# Patient Record
Sex: Female | Born: 1950 | Race: Black or African American | Hispanic: No | Marital: Single | Smoking: Former smoker
Health system: Southern US, Community
[De-identification: ages and names within clinical notes are randomized; demographics above are authoritative.]

## PROBLEM LIST (undated history)

## (undated) DIAGNOSIS — I10 Essential (primary) hypertension: Secondary | ICD-10-CM

## (undated) DIAGNOSIS — C801 Malignant (primary) neoplasm, unspecified: Secondary | ICD-10-CM

## (undated) DIAGNOSIS — I739 Peripheral vascular disease, unspecified: Secondary | ICD-10-CM

## (undated) DIAGNOSIS — M545 Low back pain, unspecified: Secondary | ICD-10-CM

## (undated) DIAGNOSIS — E7800 Pure hypercholesterolemia, unspecified: Secondary | ICD-10-CM

## (undated) DIAGNOSIS — I639 Cerebral infarction, unspecified: Secondary | ICD-10-CM

## (undated) DIAGNOSIS — K8020 Calculus of gallbladder without cholecystitis without obstruction: Secondary | ICD-10-CM

## (undated) DIAGNOSIS — R0600 Dyspnea, unspecified: Secondary | ICD-10-CM

## (undated) DIAGNOSIS — F319 Bipolar disorder, unspecified: Secondary | ICD-10-CM

## (undated) DIAGNOSIS — G8929 Other chronic pain: Secondary | ICD-10-CM

## (undated) DIAGNOSIS — F329 Major depressive disorder, single episode, unspecified: Secondary | ICD-10-CM

## (undated) DIAGNOSIS — R221 Localized swelling, mass and lump, neck: Secondary | ICD-10-CM

## (undated) DIAGNOSIS — F419 Anxiety disorder, unspecified: Secondary | ICD-10-CM

## (undated) DIAGNOSIS — K219 Gastro-esophageal reflux disease without esophagitis: Secondary | ICD-10-CM

## (undated) DIAGNOSIS — J42 Unspecified chronic bronchitis: Secondary | ICD-10-CM

## (undated) DIAGNOSIS — F209 Schizophrenia, unspecified: Secondary | ICD-10-CM

## (undated) DIAGNOSIS — Z923 Personal history of irradiation: Secondary | ICD-10-CM

## (undated) DIAGNOSIS — M1990 Unspecified osteoarthritis, unspecified site: Secondary | ICD-10-CM

## (undated) DIAGNOSIS — F32A Depression, unspecified: Secondary | ICD-10-CM

## (undated) HISTORY — PX: BREAST BIOPSY: SHX20

## (undated) HISTORY — PX: ANKLE FRACTURE SURGERY: SHX122

## (undated) HISTORY — DX: Unspecified osteoarthritis, unspecified site: M19.90

## (undated) HISTORY — PX: ABDOMINAL HYSTERECTOMY: SHX81

## (undated) HISTORY — DX: Cerebral infarction, unspecified: I63.9

## (undated) HISTORY — DX: Malignant (primary) neoplasm, unspecified: C80.1

## (undated) HISTORY — PX: CATARACT EXTRACTION W/ INTRAOCULAR LENS  IMPLANT, BILATERAL: SHX1307

## (undated) HISTORY — PX: PILONIDAL CYST EXCISION: SHX744

## (undated) HISTORY — PX: EXCISIONAL HEMORRHOIDECTOMY: SHX1541

## (undated) HISTORY — PX: ANKLE HARDWARE REMOVAL: SHX1149

## (undated) HISTORY — PX: BREAST CYST EXCISION: SHX579

## (undated) HISTORY — PX: APPENDECTOMY: SHX54

---

## 2002-11-15 ENCOUNTER — Encounter: Payer: Self-pay | Admitting: Internal Medicine

## 2002-11-15 ENCOUNTER — Encounter: Admission: RE | Admit: 2002-11-15 | Discharge: 2002-11-15 | Payer: Self-pay | Admitting: Internal Medicine

## 2003-10-24 ENCOUNTER — Emergency Department (HOSPITAL_COMMUNITY): Admission: EM | Admit: 2003-10-24 | Discharge: 2003-10-24 | Payer: Self-pay | Admitting: Emergency Medicine

## 2004-04-22 ENCOUNTER — Encounter: Admission: RE | Admit: 2004-04-22 | Discharge: 2004-04-22 | Payer: Self-pay | Admitting: Internal Medicine

## 2004-05-22 ENCOUNTER — Ambulatory Visit (HOSPITAL_COMMUNITY): Admission: RE | Admit: 2004-05-22 | Discharge: 2004-05-22 | Payer: Self-pay | Admitting: *Deleted

## 2005-11-25 ENCOUNTER — Encounter: Admission: RE | Admit: 2005-11-25 | Discharge: 2005-11-25 | Payer: Self-pay | Admitting: Internal Medicine

## 2006-11-26 ENCOUNTER — Encounter: Admission: RE | Admit: 2006-11-26 | Discharge: 2006-11-26 | Payer: Self-pay | Admitting: Internal Medicine

## 2007-11-29 ENCOUNTER — Encounter: Admission: RE | Admit: 2007-11-29 | Discharge: 2007-11-29 | Payer: Self-pay | Admitting: Internal Medicine

## 2008-02-09 ENCOUNTER — Emergency Department (HOSPITAL_COMMUNITY): Admission: EM | Admit: 2008-02-09 | Discharge: 2008-02-09 | Payer: Self-pay | Admitting: Emergency Medicine

## 2008-03-10 ENCOUNTER — Encounter: Admission: RE | Admit: 2008-03-10 | Discharge: 2008-03-10 | Payer: Self-pay | Admitting: Internal Medicine

## 2008-12-06 ENCOUNTER — Encounter: Admission: RE | Admit: 2008-12-06 | Discharge: 2008-12-06 | Payer: Self-pay | Admitting: Internal Medicine

## 2010-01-03 ENCOUNTER — Encounter: Admission: RE | Admit: 2010-01-03 | Discharge: 2010-01-03 | Payer: Self-pay | Admitting: Internal Medicine

## 2010-11-11 ENCOUNTER — Emergency Department (HOSPITAL_COMMUNITY)
Admission: EM | Admit: 2010-11-11 | Discharge: 2010-11-12 | Payer: Self-pay | Source: Home / Self Care | Admitting: Emergency Medicine

## 2010-12-22 ENCOUNTER — Encounter: Payer: Self-pay | Admitting: Internal Medicine

## 2010-12-23 ENCOUNTER — Other Ambulatory Visit: Payer: Self-pay | Admitting: Internal Medicine

## 2010-12-23 DIAGNOSIS — Z1239 Encounter for other screening for malignant neoplasm of breast: Secondary | ICD-10-CM

## 2011-01-08 ENCOUNTER — Ambulatory Visit: Payer: Self-pay

## 2011-01-08 ENCOUNTER — Ambulatory Visit
Admission: RE | Admit: 2011-01-08 | Discharge: 2011-01-08 | Disposition: A | Discharge status: Home / Self Care | Payer: Medicaid Other | Source: Ambulatory Visit | Attending: Internal Medicine | Admitting: Internal Medicine

## 2011-01-08 DIAGNOSIS — Z1239 Encounter for other screening for malignant neoplasm of breast: Secondary | ICD-10-CM

## 2011-08-25 LAB — HEPATIC FUNCTION PANEL
AST: 18
Albumin: 3.4 — ABNORMAL LOW
Alkaline Phosphatase: 62
Total Protein: 6.6

## 2011-08-25 LAB — URINALYSIS, ROUTINE W REFLEX MICROSCOPIC
Hgb urine dipstick: NEGATIVE
Specific Gravity, Urine: 1.015
Urobilinogen, UA: 0.2
pH: 5.5

## 2011-08-25 LAB — CBC
MCV: 88.1
RBC: 5.01
RDW: 15.9 — ABNORMAL HIGH
WBC: 9.2

## 2011-08-25 LAB — I-STAT 8, (EC8 V) (CONVERTED LAB)
BUN: 10
Bicarbonate: 20.8
Chloride: 111
Potassium: 4
Sodium: 140
pH, Ven: 7.359 — ABNORMAL HIGH

## 2011-08-25 LAB — DIFFERENTIAL
Basophils Absolute: 0.1
Basophils Relative: 1
Eosinophils Relative: 1
Monocytes Absolute: 0.7

## 2011-08-25 LAB — POCT CARDIAC MARKERS: Troponin i, poc: <0.05

## 2012-01-26 ENCOUNTER — Other Ambulatory Visit: Payer: Self-pay | Admitting: Internal Medicine

## 2012-01-28 ENCOUNTER — Other Ambulatory Visit: Payer: Self-pay | Admitting: Internal Medicine

## 2012-01-28 DIAGNOSIS — Z1231 Encounter for screening mammogram for malignant neoplasm of breast: Secondary | ICD-10-CM

## 2012-02-17 ENCOUNTER — Ambulatory Visit
Admission: RE | Admit: 2012-02-17 | Discharge: 2012-02-17 | Disposition: A | Discharge status: Home / Self Care | Payer: Medicaid Other | Source: Ambulatory Visit | Attending: Internal Medicine | Admitting: Internal Medicine

## 2012-02-17 ENCOUNTER — Other Ambulatory Visit: Payer: Self-pay | Admitting: Internal Medicine

## 2012-02-17 DIAGNOSIS — Z1231 Encounter for screening mammogram for malignant neoplasm of breast: Secondary | ICD-10-CM

## 2012-02-17 DIAGNOSIS — N644 Mastodynia: Secondary | ICD-10-CM

## 2012-02-24 ENCOUNTER — Ambulatory Visit
Admission: RE | Admit: 2012-02-24 | Discharge: 2012-02-24 | Disposition: A | Discharge status: Home / Self Care | Payer: Medicaid Other | Source: Ambulatory Visit | Attending: Internal Medicine | Admitting: Internal Medicine

## 2012-02-24 DIAGNOSIS — N644 Mastodynia: Secondary | ICD-10-CM

## 2013-01-21 ENCOUNTER — Other Ambulatory Visit: Payer: Self-pay | Admitting: Internal Medicine

## 2013-01-21 DIAGNOSIS — Z1231 Encounter for screening mammogram for malignant neoplasm of breast: Secondary | ICD-10-CM

## 2013-02-22 ENCOUNTER — Ambulatory Visit: Payer: Medicaid Other

## 2013-02-25 ENCOUNTER — Ambulatory Visit: Payer: Self-pay | Admitting: Podiatry

## 2013-03-16 ENCOUNTER — Ambulatory Visit: Payer: Medicaid Other

## 2013-04-08 ENCOUNTER — Ambulatory Visit: Payer: Medicaid Other

## 2013-05-01 HISTORY — PX: MULTIPLE TOOTH EXTRACTIONS: SHX2053

## 2013-06-07 ENCOUNTER — Ambulatory Visit: Payer: Medicaid Other

## 2013-06-23 ENCOUNTER — Ambulatory Visit
Admission: RE | Admit: 2013-06-23 | Discharge: 2013-06-23 | Disposition: A | Discharge status: Home / Self Care | Payer: Medicaid Other | Source: Ambulatory Visit | Attending: Internal Medicine | Admitting: Internal Medicine

## 2013-06-23 DIAGNOSIS — Z1231 Encounter for screening mammogram for malignant neoplasm of breast: Secondary | ICD-10-CM

## 2014-02-08 ENCOUNTER — Other Ambulatory Visit: Payer: Self-pay | Admitting: Internal Medicine

## 2014-02-08 DIAGNOSIS — R1011 Right upper quadrant pain: Secondary | ICD-10-CM

## 2014-02-16 ENCOUNTER — Other Ambulatory Visit: Payer: Medicaid Other

## 2014-02-21 ENCOUNTER — Ambulatory Visit
Admission: RE | Admit: 2014-02-21 | Discharge: 2014-02-21 | Disposition: A | Discharge status: Home / Self Care | Payer: Medicaid Other | Source: Ambulatory Visit | Attending: Internal Medicine | Admitting: Internal Medicine

## 2014-02-21 DIAGNOSIS — R1011 Right upper quadrant pain: Secondary | ICD-10-CM

## 2014-02-26 ENCOUNTER — Emergency Department (HOSPITAL_COMMUNITY)
Admission: EM | Admit: 2014-02-26 | Discharge: 2014-02-26 | Disposition: A | Discharge status: Home / Self Care | Payer: Medicaid Other | Attending: Emergency Medicine | Admitting: Emergency Medicine

## 2014-02-26 ENCOUNTER — Encounter (HOSPITAL_COMMUNITY): Payer: Self-pay | Admitting: Emergency Medicine

## 2014-02-26 ENCOUNTER — Emergency Department (HOSPITAL_COMMUNITY): Payer: Medicaid Other

## 2014-02-26 DIAGNOSIS — R4182 Altered mental status, unspecified: Secondary | ICD-10-CM

## 2014-02-26 DIAGNOSIS — M549 Dorsalgia, unspecified: Secondary | ICD-10-CM | POA: Insufficient documentation

## 2014-02-26 DIAGNOSIS — F1010 Alcohol abuse, uncomplicated: Secondary | ICD-10-CM | POA: Insufficient documentation

## 2014-02-26 DIAGNOSIS — F1210 Cannabis abuse, uncomplicated: Secondary | ICD-10-CM | POA: Insufficient documentation

## 2014-02-26 DIAGNOSIS — I10 Essential (primary) hypertension: Secondary | ICD-10-CM | POA: Insufficient documentation

## 2014-02-26 DIAGNOSIS — Z8669 Personal history of other diseases of the nervous system and sense organs: Secondary | ICD-10-CM | POA: Insufficient documentation

## 2014-02-26 DIAGNOSIS — F17200 Nicotine dependence, unspecified, uncomplicated: Secondary | ICD-10-CM | POA: Insufficient documentation

## 2014-02-26 HISTORY — DX: Essential (primary) hypertension: I10

## 2014-02-26 LAB — URINALYSIS, ROUTINE W REFLEX MICROSCOPIC
BILIRUBIN URINE: NEGATIVE
GLUCOSE, UA: NEGATIVE mg/dL
HGB URINE DIPSTICK: NEGATIVE
KETONES UR: 15 mg/dL — AB
Leukocytes, UA: NEGATIVE
NITRITE: NEGATIVE
PROTEIN: NEGATIVE mg/dL
SPECIFIC GRAVITY, URINE: 1.026 (ref 1.005–1.030)
Urobilinogen, UA: 0.2 mg/dL (ref 0.0–1.0)
pH: 5.5 (ref 5.0–8.0)

## 2014-02-26 LAB — CBC WITH DIFFERENTIAL/PLATELET
BASOS PCT: 0 % (ref 0–1)
Basophils Absolute: 0 10*3/uL (ref 0.0–0.1)
Eosinophils Absolute: 0.1 10*3/uL (ref 0.0–0.7)
Eosinophils Relative: 1 % (ref 0–5)
HCT: 44.1 % (ref 36.0–46.0)
HEMOGLOBIN: 16 g/dL — AB (ref 12.0–15.0)
LYMPHS ABS: 3.7 10*3/uL (ref 0.7–4.0)
Lymphocytes Relative: 54 % — ABNORMAL HIGH (ref 12–46)
MCH: 30.8 pg (ref 26.0–34.0)
MCHC: 36.3 g/dL — ABNORMAL HIGH (ref 30.0–36.0)
MCV: 84.8 fL (ref 78.0–100.0)
MONOS PCT: 8 % (ref 3–12)
Monocytes Absolute: 0.6 10*3/uL (ref 0.1–1.0)
NEUTROS ABS: 2.6 10*3/uL (ref 1.7–7.7)
NEUTROS PCT: 37 % — AB (ref 43–77)
Platelets: 190 10*3/uL (ref 150–400)
RBC: 5.2 MIL/uL — AB (ref 3.87–5.11)
RDW: 14.1 % (ref 11.5–15.5)
WBC: 6.9 10*3/uL (ref 4.0–10.5)

## 2014-02-26 LAB — COMPREHENSIVE METABOLIC PANEL
ALT: 12 U/L (ref 0–35)
AST: 15 U/L (ref 0–37)
Albumin: 3.5 g/dL (ref 3.5–5.2)
Alkaline Phosphatase: 79 U/L (ref 39–117)
BUN: 12 mg/dL (ref 6–23)
CALCIUM: 9.1 mg/dL (ref 8.4–10.5)
CO2: 23 meq/L (ref 19–32)
CREATININE: 0.54 mg/dL (ref 0.50–1.10)
Chloride: 102 mEq/L (ref 96–112)
GLUCOSE: 79 mg/dL (ref 70–99)
Potassium: 2.9 mEq/L — CL (ref 3.7–5.3)
Sodium: 140 mEq/L (ref 137–147)
Total Bilirubin: 0.5 mg/dL (ref 0.3–1.2)
Total Protein: 6.9 g/dL (ref 6.0–8.3)

## 2014-02-26 LAB — RAPID URINE DRUG SCREEN, HOSP PERFORMED
AMPHETAMINES: NOT DETECTED
BARBITURATES: NOT DETECTED
BENZODIAZEPINES: NOT DETECTED
Cocaine: NOT DETECTED
Opiates: NOT DETECTED
Tetrahydrocannabinol: POSITIVE — AB

## 2014-02-26 LAB — ACETAMINOPHEN LEVEL: Acetaminophen (Tylenol), Serum: <15 ug/mL (ref 10–30)

## 2014-02-26 LAB — AMMONIA: AMMONIA: 29 umol/L (ref 11–60)

## 2014-02-26 LAB — ETHANOL

## 2014-02-26 MED ORDER — POTASSIUM CHLORIDE CRYS ER 20 MEQ PO TBCR
40.0000 meq | EXTENDED_RELEASE_TABLET | Freq: Once | ORAL | Status: AC
  Administered 2014-02-26: 40 meq via ORAL
  Filled 2014-02-26: qty 2

## 2014-02-26 MED ORDER — SODIUM CHLORIDE 0.9 % IV SOLN
INTRAVENOUS | Status: DC
  Administered 2014-02-26: 13:00:00 via INTRAVENOUS

## 2014-02-26 MED ORDER — THIAMINE HCL 100 MG/ML IJ SOLN
100.0000 mg | Freq: Once | INTRAMUSCULAR | Status: AC
  Administered 2014-02-26: 100 mg via INTRAVENOUS
  Filled 2014-02-26: qty 2

## 2014-02-26 MED ORDER — POTASSIUM CHLORIDE CRYS ER 20 MEQ PO TBCR: 20.0000 meq | EXTENDED_RELEASE_TABLET | Freq: Every day | ORAL | Status: DC

## 2014-02-26 NOTE — ED Notes (Addendum)
Pt presents to department from home via [facility] for evaluation of altered mental status. Family states she was confused today. LSN: Friday, equal grip strengths, pt is lethargic but able to answer most simple questions. 20g L forearm. CBG 162. Pt has slurred and garbled speech, no facial droop noted. Strong bilateral grip strengths.

## 2014-02-26 NOTE — ED Notes (Signed)
EDP at bedside to perform exam, daughter also at bedside. Pt remains lethargic and confused.

## 2014-02-26 NOTE — Discharge Instructions (Signed)
Altered Mental Status Take your medications only as prescribed. Mixing Alcohol and your pain medications is potentially dangerous and can cause death. Call your pain management Center and advised the physician that your pain is not well controlled. Avoid marijuana and alcohol. Call Dr. Santiago Bur office tomorrow and ask him to recheck your blood potassium this week. Also ask him to help you to stop smoking. It is safe to take Robitussin as directed for cough Altered mental status most often refers to an abnormal change in your responsiveness and awareness. It can affect your speech, thought, mobility, memory, attention span, or alertness. It can range from slight confusion to complete unresponsiveness (coma). Altered mental status can be a sign of a serious underlying medical condition. Rapid evaluation and medical treatment is necessary for patients having an altered mental status. CAUSES   Low blood sugar (hypoglycemia) or diabetes.  Severe loss of body fluids (dehydration) or a body salt (electrolyte) imbalance.  A stroke or other neurologic problem, such as dementia or delirium.  A head injury or tumor.  A drug or alcohol overdose.  Exposure to toxins or poisons.  Depression, anxiety, and stress.  A low oxygen level (hypoxia).  An infection.  Blood loss.  Twitching or shaking (seizure).  Heart problems, such as heart attack or heart rhythm problems (arrhythmias).  A body temperature that is too low or too high (hypothermia or hyperthermia). DIAGNOSIS  A diagnosis is based on your history, symptoms, physical and neurologic examinations, and diagnostic tests. Diagnostic tests may include:  Measurement of your blood pressure, pulse, breathing, and oxygen levels (vital signs).  Blood tests.  Urine tests.  X-ray exams.  A computerized magnetic scan (magnetic resonance imaging, MRI).  A computerized X-ray scan (computed tomography, CT scan). TREATMENT  Treatment will depend  on the cause. Treatment may include:  Management of an underlying medical or mental health condition.  Critical care or support in the hospital. [place]   Only take over-the-counter or prescription medicines for pain, discomfort, or fever as directed by your caregiver.  Manage underlying conditions as directed by your caregiver.  Eat a healthy, well-balanced diet to maintain strength.  Join a support group or prevention program to cope with the condition or trauma that caused the altered mental status. Ask your caregiver to help choose a program that works for you.  Follow up with your caregiver for further examination, therapy, or testing as directed. SEEK MEDICAL CARE IF:   You feel unwell or have chills.  You or your family notice a change in your behavior or your alertness.  You have trouble following your caregiver's treatment plan.  You have questions or concerns. SEEK IMMEDIATE MEDICAL CARE IF:   You have a rapid heartbeat or have chest pain.  You have difficulty breathing.  You have a fever.  You have a headache with a stiff neck.  You cough up blood.  You have blood in your urine or stool.  You have severe agitation or confusion. MAKE SURE YOU:   Understand these instructions.  Will watch your condition.  Will get help right away if you are not doing well or get worse. Document Released: 05/07/2010 Document Revised: 02/09/2012 Document Reviewed: 05/07/2010 [facility] Patient Information 2014 [place].

## 2014-02-26 NOTE — ED Notes (Signed)
Daughter states that patient drinks several beers each day (10-12) cans. Daughter thinks she could of had seizure.

## 2014-02-26 NOTE — ED Notes (Signed)
Pt is alert at the time and answering questions appropriately. States she feels very tired. Vital signs stable. Family at bedside.

## 2014-02-26 NOTE — ED Provider Notes (Signed)
CSN: [id number]     Arrival date & time 02/26/14  1143 History   None    Chief Complaint  Patient presents with  . Altered Mental Status     (Consider location/radiation/quality/duration/timing/severity/associated sxs/prior Treatment) HPI Low 5 caveat altered mental status Patient was found by her caretaker and by her daughter in bed this morning after she did not answer the door. Her daughter climbed into the window of the patient's house and found her unresponsive in the bed foaming at the mouth. She thinks her mother may have had a seizure. 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 Patient presently complains of right-sided back pain which she's experienced for several years. She's treated with Percocet in a pain clinic. Patient admits to drinking alcohol 2 nights ago at a party. No treatment prior to coming here.  Past Medical History  Diagnosis Date  . Hypertension   . Back pain    alcohol abuse. Currently heavy drinker History reviewed. No pertinent past surgical history. No family history on file. History  Substance Use Topics  . Smoking status: Current Every Day Smoker    Types: Cigarettes  . Smokeless tobacco: Not on file  . Alcohol Use: Yes   OB History   Grav Para Term Preterm Abortions TAB SAB Ect Mult Living                 no illicit drug Review of Systems  Unable to perform ROS: Mental status change      Allergies  Review of patient's allergies indicates no known allergies.  Home Medications  No current outpatient prescriptions on file. BP 122/79  Temp(Src) 98.1 F (36.7 C) (Oral)  SpO2 94% Physical Exam  Nursing note and vitals reviewed. Constitutional:  Chronically ill-appearing  HENT:  Head: Normocephalic and atraumatic.  Eyes: Conjunctivae are normal. Pupils are equal, round, and reactive to light.  Neck: Neck supple. No tracheal deviation present. No thyromegaly present.  Cardiovascular: Normal rate and regular rhythm.   No murmur  heard. Pulmonary/Chest: Effort normal and breath sounds normal.  Abdominal: Soft. Bowel sounds are normal. She exhibits no distension. There is no tenderness.  Musculoskeletal: Normal range of motion. She exhibits no edema and no tenderness.  Entire spine nontender  Neurological: She is alert. She has normal reflexes. Coordination normal.  Oriented to name and month does not know year. Speech slurred. DTRs symmetric bilaterally knee jerk ankle jerk and biceps was ordered bilaterally. No asterixis. Follow simple commands moves all extremities  Skin: Skin is warm and dry. No rash noted.  Psychiatric: She has a normal mood and affect.    ED Course  Procedures (including critical care time) Labs Review Labs Reviewed - No data to display Imaging Review No results found.   EKG Interpretation   Date/Time:  Sunday February 26 2014 16:04:44 EDT Ventricular Rate:  82 PR Interval:    QRS Duration: 76 QT Interval:  353 QTC Calculation: 412 R Axis:   82 Text Interpretation:  Atrial fibrillation Borderline right axis deviation  Nonspecific T abnormalities, lateral leads No significant change since  last tracing Confirmed by Deontrae Drinkard  MD, Tad Fancher [phone]) on 02/26/2014 4:07:30  PM      Results for orders placed during the hospital encounter of 02/26/14  COMPREHENSIVE METABOLIC PANEL      Result Value Ref Range   Sodium 140  137 - 147 mEq/L   Potassium 2.9 (*) 3.7 - 5.3 mEq/L   Chloride 102  96 - 112  mEq/L   CO2 23  19 - 32 mEq/L   Glucose, Bld 79  70 - 99 mg/dL   BUN 12  6 - 23 mg/dL   Creatinine, Ser 0.54  0.50 - 1.10 mg/dL   Calcium 9.1  8.4 - 10.5 mg/dL   Total Protein 6.9  6.0 - 8.3 g/dL   Albumin 3.5  3.5 - 5.2 g/dL   AST 15  0 - 37 U/L   ALT 12  0 - 35 U/L   Alkaline Phosphatase 79  39 - 117 U/L   Total Bilirubin 0.5  0.3 - 1.2 mg/dL   GFR calc non Af Amer >90  >90 mL/min   GFR calc Af Amer >90  >90 mL/min  CBC WITH DIFFERENTIAL      Result Value Ref Range   WBC 6.9  4.0 - 10.5  K/uL   RBC 5.20 (*) 3.87 - 5.11 MIL/uL   Hemoglobin 16.0 (*) 12.0 - 15.0 g/dL   HCT 44.1  36.0 - 46.0 %   MCV 84.8  78.0 - 100.0 fL   MCH 30.8  26.0 - 34.0 pg   MCHC 36.3 (*) 30.0 - 36.0 g/dL   RDW 14.1  11.5 - 15.5 %   Platelets 190  150 - 400 K/uL   Neutrophils Relative % 37 (*) 43 - 77 %   Neutro Abs 2.6  1.7 - 7.7 K/uL   Lymphocytes Relative 54 (*) 12 - 46 %   Lymphs Abs 3.7  0.7 - 4.0 K/uL   Monocytes Relative 8  3 - 12 %   Monocytes Absolute 0.6  0.1 - 1.0 K/uL   Eosinophils Relative 1  0 - 5 %   Eosinophils Absolute 0.1  0.0 - 0.7 K/uL   Basophils Relative 0  0 - 1 %   Basophils Absolute 0.0  0.0 - 0.1 K/uL  URINALYSIS, ROUTINE W REFLEX MICROSCOPIC      Result Value Ref Range   Color, Urine YELLOW  YELLOW   APPearance CLEAR  CLEAR   Specific Gravity, Urine 1.026  1.005 - 1.030   pH 5.5  5.0 - 8.0   Glucose, UA NEGATIVE  NEGATIVE mg/dL   Hgb urine dipstick NEGATIVE  NEGATIVE   Bilirubin Urine NEGATIVE  NEGATIVE   Ketones, ur 15 (*) NEGATIVE mg/dL   Protein, ur NEGATIVE  NEGATIVE mg/dL   Urobilinogen, UA 0.2  0.0 - 1.0 mg/dL   Nitrite NEGATIVE  NEGATIVE   Leukocytes, UA NEGATIVE  NEGATIVE  URINE RAPID DRUG SCREEN (HOSP PERFORMED)      Result Value Ref Range   Opiates NONE DETECTED  NONE DETECTED   Cocaine NONE DETECTED  NONE DETECTED   Benzodiazepines NONE DETECTED  NONE DETECTED   Amphetamines NONE DETECTED  NONE DETECTED   Tetrahydrocannabinol POSITIVE (*) NONE DETECTED   Barbiturates NONE DETECTED  NONE DETECTED  ETHANOL      Result Value Ref Range   Alcohol, Ethyl (B) <11  0 - 11 mg/dL  AMMONIA      Result Value Ref Range   Ammonia 29  11 - 60 umol/L  ACETAMINOPHEN LEVEL      Result Value Ref Range   Acetaminophen (Tylenol), Serum <15.0  10 - 30 ug/mL   Dg Chest 2 View  02/26/2014   CLINICAL DATA:  Altered mental status.  EXAM: CHEST - 2 VIEW  COMPARISON:  DG CHEST 2 VIEW dated 11/12/2010; DG CHEST 2 VIEW dated 02/09/2008  FINDINGS: The heart size  and  mediastinal contours are within normal limits. Mild scarring present in both lower lung zones. There is no evidence of pulmonary edema, consolidation, pneumothorax, nodule or pleural fluid. The visualized skeletal structures are unremarkable.  IMPRESSION: No active disease.   Electronically Signed   By: Aletta Edouard M.D.   On: 02/26/2014 13:26   Ct Head Wo Contrast  02/26/2014   CLINICAL DATA:  Altered mental status.  EXAM: CT HEAD WITHOUT CONTRAST  TECHNIQUE: Contiguous axial images were obtained from the base of the skull through the vertex without contrast.  COMPARISON:  11/12/2010  FINDINGS: Normal appearance of the intracranial structures. No evidence for acute hemorrhage, mass lesion, midline shift, hydrocephalus or large infarct. No acute bony abnormality. The visualized sinuses are clear.  IMPRESSION: No acute intracranial abnormality.   Electronically Signed   By: Markus Daft M.D.   On: 02/26/2014 13:10   US Abdomen Complete  02/21/2014   CLINICAL DATA:  Abdominal pain, right upper quadrant  EXAM: ULTRASOUND ABDOMEN COMPLETE  COMPARISON:  None.  FINDINGS: Gallbladder:  The gallbladder is visualized and there is a single mobile gallstone of 1.4 cm in diameter with acoustical shadowing. No pain is present over the gallbladder with compression.  Common bile duct:  Diameter: The common bile duct is within upper limits of normal and 7.2 mm in diameter. Correlation with liver function tests is recommended.  Liver:  The liver has a normal echogenic appearance. There is a complex cyst anteriorly in the left lobe of 1.3 x 1.2 x 1.3 cm. A small cyst in the right lobe measures 1.2 x 0.7 x 0.9 cm.  IVC:  No abnormality visualized.  Pancreas:  Visualized portion unremarkable.  Spleen:  The spleen is normal measuring 7.4 cm sagittally.  Right Kidney:  Length: 11.3 cm.  No hydronephrosis is seen.  Left Kidney:  Length: 10.3 cm.  No hydronephrosis is noted.  Abdominal aorta:  Portions of the abdominal aorta are  obscured by bowel gas but no definite aneurysm is seen.  Other findings:  None.  IMPRESSION: 1. 1.4 cm mobile gallstone. No pain is present over the gallbladder. 2. Probable small complex hepatic cysts. No definite solid hepatic lesion is seen.   Electronically Signed   By: Ivar Drape M.D.   On: 02/21/2014 10:50    4 PM patient is alert and ambulates without difficulty Glasgow Coma Score 15. Speech is clear. She is oriented x3. She looks at baseline per her daughter. MDM  Patient may have suffered a seizure. She's had seizures several years ago. Also feels that she may be over medicating herself with pain medications. Patient admits to smoking marijuana to control pain as her medication regimen from pain clinic is not adequate. Altered mental status is resolved. Etiology is from a seizure and/or  Overmedication Plan prescription for K-Dur. She is to followup with Dr.Avbuere this week to have potassium rechecked. She can take Robitussin as directed for cough. I spent 5 minutes counseling patient on smoking cessation. She is advised. Do not mix medications with alcohol. Stop marijuana. Take medications only as prescribed Final diagnoses:  None   Diagnosis #1 altered mental status-resolved #2 hypokalemia #3 chronic cough #4 chronic pain #5 polysubstance abuse #6 tobacco abuse   Orlie Dakin, MD 02/26/14 [phone]

## 2014-02-26 NOTE — ED Notes (Signed)
Pt up to bathroom with assistance 

## 2014-02-26 NOTE — ED Notes (Signed)
Lab called with critical potassium of 2.9, EDP notified.

## 2014-04-14 ENCOUNTER — Ambulatory Visit: Payer: Self-pay | Admitting: Podiatry

## 2014-04-27 ENCOUNTER — Ambulatory Visit (INDEPENDENT_AMBULATORY_CARE_PROVIDER_SITE_OTHER): Payer: Medicaid Other | Admitting: General Surgery

## 2014-05-01 ENCOUNTER — Encounter (INDEPENDENT_AMBULATORY_CARE_PROVIDER_SITE_OTHER): Payer: Self-pay | Admitting: General Surgery

## 2014-05-03 ENCOUNTER — Ambulatory Visit: Payer: Self-pay | Admitting: Podiatry

## 2014-05-11 ENCOUNTER — Other Ambulatory Visit (INDEPENDENT_AMBULATORY_CARE_PROVIDER_SITE_OTHER): Payer: Self-pay | Admitting: General Surgery

## 2014-05-11 ENCOUNTER — Encounter (INDEPENDENT_AMBULATORY_CARE_PROVIDER_SITE_OTHER): Payer: Self-pay | Admitting: General Surgery

## 2014-05-11 ENCOUNTER — Ambulatory Visit (INDEPENDENT_AMBULATORY_CARE_PROVIDER_SITE_OTHER): Payer: Medicaid Other | Admitting: General Surgery

## 2014-05-11 VITALS — BP 121/63 | HR 75 | Temp 98.6°F | Resp 16 | Ht 61.0 in | Wt 123.8 lb

## 2014-05-11 DIAGNOSIS — K8020 Calculus of gallbladder without cholecystitis without obstruction: Secondary | ICD-10-CM

## 2014-05-11 DIAGNOSIS — R109 Unspecified abdominal pain: Secondary | ICD-10-CM | POA: Insufficient documentation

## 2014-05-11 DIAGNOSIS — R1011 Right upper quadrant pain: Secondary | ICD-10-CM

## 2014-05-11 DIAGNOSIS — R1031 Right lower quadrant pain: Secondary | ICD-10-CM

## 2014-05-11 NOTE — Progress Notes (Signed)
Patient ID: Faith [name], female   DOB: 05-21-1951, 63 y.o.   MRN: [id number]  Chief Complaint  Patient presents with  . Abdominal Pain    gallbladder    HPI Faith [name] is a 63 y.o. female.   HPI 63 yo AAF referred by Dr Jeanie Cooks for evaluation of abdominal pain. The patient reports a several month history of right sided pain. She states her pain is on her right side and radiates to her back. It is more in the right lower abdomen and upper abdomen. It is very intense at times. It initially was in frequent but now it is daily and constant. It is often associated with nausea and vomiting. It is worse when she has a bowel movement. She denies any chest pain. She denies any significant NSAID use. She denies any jaundice. She hasn't really found anything that makes it better. She does take chronic pain medicine and sometimes that might help a little bit. She states her appetite has been off. Her stools are very typically regular. She denies any melena or hematochezia. She underwent a colonoscopy a few months ago which showed diverticuli in the sigmoid colon as well as some hemorrhoids.she does smoke up to 2 packs of cigarettes a day. Past Medical History  Diagnosis Date  . Hypertension   . Back pain   . Arthritis     Past Surgical History  Procedure Laterality Date  . Abdominal hysterectomy    . Breast surgery    . Back surgery    . Ankle surgery      History reviewed. No pertinent family history.  Social History History  Substance Use Topics  . Smoking status: Current Every Day Smoker -- 1.50 packs/day    Types: Cigarettes  . Smokeless tobacco: Not on file  . Alcohol Use: Yes     Comment: rare    No Known Allergies  Current Outpatient Prescriptions  Medication Sig Dispense Refill  . amLODipine-benazepril (LOTREL) 5-10 MG per capsule Take 1 capsule by mouth daily.      Marland Kitchen buPROPion (WELLBUTRIN XL) 150 MG 24 hr tablet Take 150 mg by mouth daily.      Marland Kitchen estrogens, conjugated,  (PREMARIN) 0.625 MG tablet Take 0.625 mg by mouth daily. Take daily for 21 days then do not take for 7 days.      . fluticasone (FLONASE) 50 MCG/ACT nasal spray Place 2 sprays into both nostrils daily.      . furosemide (LASIX) 20 MG tablet Take 20 mg by mouth daily.      Marland Kitchen ibuprofen (ADVIL,MOTRIN) 800 MG tablet Take 800 mg by mouth every 8 (eight) hours as needed (pain).      Marland Kitchen loratadine (CLARITIN) 10 MG tablet Take 10 mg by mouth daily.      . methocarbamol (ROBAXIN) 750 MG tablet Take 750 mg by mouth 2 (two) times daily as needed for muscle spasms.      Marland Kitchen omeprazole (PRILOSEC) 20 MG capsule Take 20 mg by mouth 2 (two) times daily before a meal.      . oxyCODONE-acetaminophen (PERCOCET) 10-325 MG per tablet Take 1 tablet by mouth every 8 (eight) hours as needed for pain.      . potassium chloride SA (K-DUR,KLOR-CON) 20 MEQ tablet Take 1 tablet (20 mEq total) by mouth daily.  10 tablet  0  . risperiDONE (RISPERDAL) 3 MG tablet Take 1.5 mg by mouth at bedtime.      . traMADol (ULTRAM) 50 MG tablet Take  50 mg by mouth 2 (two) times daily as needed (pain).      . traZODone (DESYREL) 50 MG tablet Take 50-100 mg by mouth at bedtime.       No current facility-administered medications for this visit.    Review of Systems Review of Systems  Constitutional: Negative for fever, activity change, appetite change and unexpected weight change.       Took too many pain pills and ended up in ED with AMS  HENT: Negative for nosebleeds and trouble swallowing.   Eyes: Negative for photophobia and visual disturbance.  Respiratory: Negative for chest tightness and shortness of breath.   Cardiovascular: Negative for chest pain and leg swelling.       Denies CP, SOB, orthopnea, PND, DOE  Gastrointestinal: Positive for nausea, vomiting, abdominal pain and constipation. Negative for blood in stool.  Genitourinary: Negative for dysuria and difficulty urinating.  Musculoskeletal: Negative for arthralgias.        OA pains. Has back pain - take narcotics for it  Skin: Negative for pallor and rash.  Neurological: Negative for dizziness, seizures, facial asymmetry and numbness.       Denies TIA and amaurosis fugax   Hematological: Negative for adenopathy. Does not bruise/bleed easily.  Psychiatric/Behavioral: Negative for behavioral problems and agitation.       Reportedly has history of polysubstance abuse but denies drugs/etoh    Blood pressure 121/63, pulse 75, temperature 98.6 F (37 C), temperature source Temporal, resp. rate 16, height 5\' 1"  (1.549 m), weight 123 lb 12.8 oz (56.155 kg).  Physical Exam Physical Exam  Vitals reviewed. Constitutional: She is oriented to person, place, and time. She appears well-developed and well-nourished. She appears cachectic.  Non-toxic appearance. She has a sickly appearance. No distress.  Nontoxic;   HENT:  Head: Normocephalic and atraumatic.  Right Ear: External ear normal.  Left Ear: External ear normal.  Eyes: Conjunctivae are normal. No scleral icterus.  Strabismus   Neck: Normal range of motion. Neck supple. No tracheal deviation present. No thyromegaly present.  Cardiovascular: Normal rate and normal heart sounds.   Pulmonary/Chest: Effort normal and breath sounds normal. No stridor. No respiratory distress. She has no wheezes.  Abdominal: Soft. She exhibits no distension. There is tenderness in the right upper quadrant and right lower quadrant. There is no rebound and no guarding.  Mild TTP in RUQ/RLQ. Seems to be more tender in RLQ. Tries to push my hand away when press in lower abdomen.  Musculoskeletal: She exhibits no edema and no tenderness.  Lymphadenopathy:    She has no cervical adenopathy.  Neurological: She is alert and oriented to person, place, and time. She exhibits normal muscle tone.  Skin: Skin is warm and dry. No rash noted. She is not diaphoretic. No erythema.  Psychiatric: She has a normal mood and affect. Her behavior is  normal. Judgment and thought content normal.    Data Reviewed Dr Avbuere's note ED note regarding altered mental status   Assessment    Cholelithiasis Right sided abdominal pain     Plan    I can't say for sure whether or not her abdominal pain is due to gallbladder disease. She seems to be more tender in her lower abdomen on the right side and in her upper abdomen. Although she does grimace a little but when I press in her right upper abdomen. She is somewhat of a poor historian. The colonoscopy being normal was reassuring. Nonetheless given her comorbidities I am little  bit reluctant to offer her cholecystectomy based on this alone. Therefore I recommended getting a CT scan of her abdomen and pelvis just to make sure nothing is going on in her right lower abdomen. If her CT scan is negative then a more confident and willing to offer her cholecystectomy. If her CT scan is abnormal then obviously that will change the plan.  We went ahead and discussed gallbladder disease including surgery.  I discussed laparoscopic cholecystectomy with IOC in detail.  The patient was given educational material as well as diagrams detailing the procedure.  We discussed the risks and benefits of a laparoscopic cholecystectomy including, but not limited to bleeding, infection, injury to surrounding structures such as the intestine or liver, bile leak, retained gallstones, need to convert to an open procedure, prolonged diarrhea, blood clots such as  DVT, common bile duct injury, anesthesia risks, and possible need for additional procedures.  We discussed the typical post-operative recovery course.   So for now the patient is going to get a CT scan of her abdomen and pelvis to exclude any other abnormalities. If her CT scan is normal with the exception of gallstones and I will contact the patient and offer her cholecystectomy. If her CT scan is abnormal though obviously change her followup plans. The patient and  her daughter agreed with the plan  Leighton Ruff. Redmond Pulling, MD, FACS General, Bariatric, & Minimally Invasive Surgery [facility], [place]          [place] M 05/11/2014, 1:56 PM

## 2014-05-11 NOTE — Patient Instructions (Signed)
We will get a CT scan of your abdomen to look for any abnormalities in your abdomen other than your gallbladder If the CT scan is normal, then I think we can proceed with gallbladder surgery If the CT scan is not normal, we will bring you back to the office to discuss results and plan

## 2014-05-12 ENCOUNTER — Other Ambulatory Visit: Payer: Medicaid Other

## 2014-05-15 ENCOUNTER — Ambulatory Visit (INDEPENDENT_AMBULATORY_CARE_PROVIDER_SITE_OTHER): Payer: Medicaid Other | Admitting: Podiatry

## 2014-05-15 ENCOUNTER — Encounter: Payer: Self-pay | Admitting: Podiatry

## 2014-05-15 VITALS — Ht 61.0 in | Wt 125.0 lb

## 2014-05-15 DIAGNOSIS — G5750 Tarsal tunnel syndrome, unspecified lower limb: Secondary | ICD-10-CM

## 2014-05-15 DIAGNOSIS — G589 Mononeuropathy, unspecified: Secondary | ICD-10-CM

## 2014-05-15 DIAGNOSIS — M79609 Pain in unspecified limb: Secondary | ICD-10-CM

## 2014-05-15 NOTE — Progress Notes (Deleted)
   Subjective:    Patient ID: Faith [name], female    DOB: [birth year], 63 y.o.   MRN: [id number]  HPI Comments: N debridement L 10 toenails D and O long term C thick toenails A difficult to cut T hx of Dr. Janus Molder debridement     Review of Systems  Gastrointestinal:       Currently being evaluated for gallstones.  All other systems reviewed and are negative.      Objective:   Physical Exam        Assessment & Plan:

## 2014-05-15 NOTE — Patient Instructions (Signed)

## 2014-05-15 NOTE — Progress Notes (Signed)
   Subjective:    Patient ID: Faith [name], female    DOB: 10/05/1951, 63 y.o.   MRN: [id number]  HPI Comments: N numbness L right 2, 3,4 toes D 3 months O on and off  C numbness A unknown stimuli T warm water soaks with Johnson Foot Soak  Pt complains of B/L leg cramping at night on and off, gets relief by standing out of the bed.  Pt states this has been going on for 3 months as well.  Pt states she can't afford to have her toenails trimmed.  Patient states that cramping occurs primarily at night and denies cramping or claudication with walking. She describes a persistent numbness in toes 2-4 right  Patient has history of chronic back pain and bipolar disease  Review of Systems  Gastrointestinal:       Currently being evaluated for gallstones.  All other systems reviewed and are negative.      Objective:   Physical Exam  Orientated x3 black female  Vascular: DP pulses 2/4 bilaterally PT ulcer 2/4 bilaterally Capillary reflex immediate bilaterally Feet are warm to touch bilaterally  Neurological: Sensation to 10 g monofilament wire intact 3/5 right and 2/5 left Vibratory sensation intact bilaterally Ankle reflexes equal and reactive bilaterally  Dermatological: No edema is noted Dystrophic toenails with texture and color changes x10 noted  Musculoskeletal: Unstable gait pattern noted There is no palpable calf tenderness or calf edema noted bilaterally No deformities noted      Assessment & Plan:   Assessment: Neuropathic pain undetermined origin Gait disturbance Onychomycoses  Plan: Patient advised that she has signs of neuropathy. I advised patient to contact primary care physician for further evaluation of neuropathy. Vascular status seems to be adequate  Provided patient General Information about neuropathy  Reappoint at patient's request

## 2014-05-16 ENCOUNTER — Encounter: Payer: Self-pay | Admitting: Podiatry

## 2014-05-17 ENCOUNTER — Telehealth (INDEPENDENT_AMBULATORY_CARE_PROVIDER_SITE_OTHER): Payer: Self-pay | Admitting: General Surgery

## 2014-05-17 ENCOUNTER — Other Ambulatory Visit (INDEPENDENT_AMBULATORY_CARE_PROVIDER_SITE_OTHER): Payer: Self-pay | Admitting: General Surgery

## 2014-05-17 ENCOUNTER — Ambulatory Visit
Admission: RE | Admit: 2014-05-17 | Discharge: 2014-05-17 | Disposition: A | Discharge status: Home / Self Care | Payer: Medicaid Other | Source: Ambulatory Visit | Attending: General Surgery | Admitting: General Surgery

## 2014-05-17 DIAGNOSIS — R1011 Right upper quadrant pain: Secondary | ICD-10-CM

## 2014-05-17 DIAGNOSIS — R1031 Right lower quadrant pain: Secondary | ICD-10-CM

## 2014-05-17 DIAGNOSIS — K8020 Calculus of gallbladder without cholecystitis without obstruction: Secondary | ICD-10-CM

## 2014-05-17 DIAGNOSIS — R911 Solitary pulmonary nodule: Secondary | ICD-10-CM

## 2014-05-17 MED ORDER — IOHEXOL 300 MG/ML  SOLN
100.0000 mL | Freq: Once | INTRAMUSCULAR | Status: AC | PRN
  Administered 2014-05-17: 100 mL via INTRAVENOUS

## 2014-05-17 NOTE — Telephone Encounter (Signed)
Called patient to discuss results of CT scan of abdomen and pelvis which was done to evaluate her right-sided abdominal pain. Fortunately there is really no significant abnormalities other than her hepatic cysts. No evidence of osseous abnormalities. However there was a right pulmonary lung nodule seen which was worrisome. Given her significant smoking history, I recommended getting a CT scan of her chest to further evaluate her lungs before we perform a cholecystectomy. This was discussed with the patient. She agreed with the plan. I advised her that if I needed to talk with her daughter who was not available for her to contact the office and I'd be happy to discuss results of the CT scan with the daughter.

## 2014-05-17 NOTE — Telephone Encounter (Signed)
Called patient to let know that has an Ct of chest is schedule for 05-22-14 @ 9:00 am at the [facility]. Patient understood where to go and what time to be there

## 2014-05-19 ENCOUNTER — Telehealth (INDEPENDENT_AMBULATORY_CARE_PROVIDER_SITE_OTHER): Payer: Self-pay

## 2014-05-19 NOTE — Telephone Encounter (Signed)
Msg has been forwarded to Dr Redmond Pulling to call pts niece per [facility].

## 2014-05-19 NOTE — Telephone Encounter (Signed)
Marykay Lex [phone]  of patient would like Dr Redmond Pulling to call her about patients DX

## 2014-05-19 NOTE — Telephone Encounter (Signed)
Called to discuss mgmt of pt to date. Explained ct showed a lung nodule and given her smoking history and her comorbidities I'm reluctant to offer cholecystectomy until we get CT chest to fully evaluate lungs and see if other nodules. Explained that this would require addl workup and more than likely put cholecystectomy on back burner for now if something comes of the nodule(s). Niece voiced understanding.

## 2014-05-22 ENCOUNTER — Ambulatory Visit
Admission: RE | Admit: 2014-05-22 | Discharge: 2014-05-22 | Disposition: A | Discharge status: Home / Self Care | Payer: Medicaid Other | Source: Ambulatory Visit | Attending: General Surgery | Admitting: General Surgery

## 2014-05-22 ENCOUNTER — Telehealth (INDEPENDENT_AMBULATORY_CARE_PROVIDER_SITE_OTHER): Payer: Self-pay | Admitting: General Surgery

## 2014-05-22 DIAGNOSIS — R911 Solitary pulmonary nodule: Secondary | ICD-10-CM

## 2014-05-22 MED ORDER — IOHEXOL 300 MG/ML  SOLN
75.0000 mL | Freq: Once | INTRAMUSCULAR | Status: AC | PRN
  Administered 2014-05-22: 75 mL via INTRAVENOUS

## 2014-05-22 NOTE — Telephone Encounter (Signed)
LMOM for patient niece to call back and ask for [facility]. I need to go over CT of chest and see if they want to proceed with surgery

## 2014-05-22 NOTE — Telephone Encounter (Signed)
I talked to Mrs. Martinique niece and she stated that she will talk to her Aunt to see if she still wants to still have the gallbladder surgery, she will call me back to let me know, and I told her if she does Dr Redmond Pulling will fill out the orders. And I faxed the CT to her PCP Dr. [name]

## 2014-05-23 ENCOUNTER — Telehealth (INDEPENDENT_AMBULATORY_CARE_PROVIDER_SITE_OTHER): Payer: Self-pay | Admitting: General Surgery

## 2014-05-23 NOTE — Telephone Encounter (Signed)
Called patient back and I told her that I went over everything with her niece about her CT Chest and I faxed the results to her PCP to follow CT in 3 month, from her PCP. I asked the patient if she want to have the surgery and she stated yes she is ready to be schedule for surgery. I told her that I will send an message to Dr Redmond Pulling and he will put in orders in epic and our scheduler will call her sometime this week or next week

## 2014-05-23 NOTE — Telephone Encounter (Signed)
Message copied by Maryclare Bean on Tue May 23, 2014  3:27 PM ------      Message from: Joya San      Created: Tue May 23, 2014  3:21 PM      Contact: [phone]       Pt called in concerning results of her test, she stated someone  spoke with her niece but she has questions. ------

## 2014-05-24 ENCOUNTER — Other Ambulatory Visit (INDEPENDENT_AMBULATORY_CARE_PROVIDER_SITE_OTHER): Payer: Self-pay | Admitting: General Surgery

## 2014-06-06 NOTE — Telephone Encounter (Signed)
Noted, Thanks

## 2014-06-06 NOTE — Telephone Encounter (Signed)
Noted,  Thank you!

## 2014-06-12 NOTE — Telephone Encounter (Signed)
Dr Jeanie Cooks,   Her ct chest was done. Results are in [place]. She will need a repeat chest CT in 3 months. We are proceeding with cholecystectomy  Faith [name]. Redmond Pulling, MD, FACS General, Bariatric, & Minimally Invasive Surgery [facility], [place]

## 2014-06-23 ENCOUNTER — Encounter (INDEPENDENT_AMBULATORY_CARE_PROVIDER_SITE_OTHER): Payer: Self-pay

## 2014-06-27 ENCOUNTER — Other Ambulatory Visit (INDEPENDENT_AMBULATORY_CARE_PROVIDER_SITE_OTHER): Payer: Self-pay | Admitting: General Surgery

## 2014-06-27 ENCOUNTER — Encounter (HOSPITAL_COMMUNITY): Payer: Self-pay | Admitting: Pharmacy Technician

## 2014-06-27 ENCOUNTER — Telehealth (INDEPENDENT_AMBULATORY_CARE_PROVIDER_SITE_OTHER): Payer: Self-pay

## 2014-06-27 NOTE — Telephone Encounter (Signed)
Pt scheduled for lap chole on 8/5 and preop is needing consent orders to be placed in epic. They have all of the surgery orders just not the consent.

## 2014-06-27 NOTE — Pre-Procedure Instructions (Signed)
Faith [name]  06/27/2014   Your procedure is scheduled on:  Wed, Aug 5 @ 8:30 AM  Report to [facility]  at 6:30 AM.  Call this number if you have problems the morning of surgery: [phone]   Remember:   Do not eat food or drink liquids after midnight.   Take these medicines the morning of surgery with A SIP OF WATER: Amlodipine-Benazepril(Lotrel),Wellbutrin(Bupropion),Flonase(Fluticasone),Claritin(Loratadine),Omeprazole(Prilosec), and Pain Pill(if needed)               Stop taking your Ibuprofen. No Goody's,BC's,Aleve,Aspirin,Fish Oil,or any Herbal Medications   Do not wear jewelry, make-up or nail polish.  Do not wear lotions, powders, or perfumes. You may wear deodorant.  Do not shave 48 hours prior to surgery.   Do not bring valuables to the hospital.  [facility] is not responsible                  for any belongings or valuables.               Contacts, dentures or bridgework may not be worn into surgery.  Leave suitcase in the car. After surgery it may be brought to your room.  For patients admitted to the hospital, discharge time is determined by your                treatment team.               Patients discharged the day of surgery will not be allowed to drive  home.    Special Instructions:  [place] - Preparing for Surgery  Before surgery, you can play an important role.  Because skin is not sterile, your skin needs to be as free of germs as possible.  You can reduce the number of germs on you skin by washing with CHG (chlorahexidine gluconate) soap before surgery.  CHG is an antiseptic cleaner which kills germs and bonds with the skin to continue killing germs even after washing.  Please DO NOT use if you have an allergy to CHG or antibacterial soaps.  If your skin becomes reddened/irritated stop using the CHG and inform your nurse when you arrive at Short Stay.  Do not shave (including legs and underarms) for at least 48 hours prior to the first CHG  shower.  You may shave your face.  Please follow these instructions carefully:   1.  Shower with CHG Soap the night before surgery and the                                morning of Surgery.  2.  If you choose to wash your hair, wash your hair first as usual with your       normal shampoo.  3.  After you shampoo, rinse your hair and body thoroughly to remove the                      Shampoo.  4.  Use CHG as you would any other liquid soap.  You can apply chg directly       to the skin and wash gently with scrungie or a clean washcloth.  5.  Apply the CHG Soap to your body ONLY FROM THE NECK DOWN.        Do not use on open wounds or open sores.  Avoid contact with your eyes,  ears, mouth and genitals (private parts).  Wash genitals (private parts)       with your normal soap.  6.  Wash thoroughly, paying special attention to the area where your surgery        will be performed.  7.  Thoroughly rinse your body with warm water from the neck down.  8.  DO NOT shower/wash with your normal soap after using and rinsing off       the CHG Soap.  9.  Pat yourself dry with a clean towel.            10.  Wear clean pajamas.            11.  Place clean sheets on your bed the night of your first shower and do not        sleep with pets.  Day of Surgery  Do not apply any lotions/deoderants the morning of surgery.  Please wear clean clothes to the hospital/surgery center.     Please read over the following fact sheets that you were given: Pain Booklet, Coughing and Deep Breathing and Surgical Site Infection Prevention

## 2014-06-28 ENCOUNTER — Inpatient Hospital Stay (HOSPITAL_COMMUNITY)
Admission: RE | Admit: 2014-06-28 | Discharge: 2014-06-28 | Disposition: A | Discharge status: Home / Self Care | Payer: Medicaid Other | Source: Ambulatory Visit

## 2014-06-29 ENCOUNTER — Inpatient Hospital Stay (HOSPITAL_COMMUNITY): Admission: RE | Admit: 2014-06-29 | Payer: Medicaid Other | Source: Ambulatory Visit

## 2014-07-01 DIAGNOSIS — K8020 Calculus of gallbladder without cholecystitis without obstruction: Secondary | ICD-10-CM

## 2014-07-01 HISTORY — DX: Calculus of gallbladder without cholecystitis without obstruction: K80.20

## 2014-07-03 ENCOUNTER — Encounter (HOSPITAL_COMMUNITY)
Admission: RE | Admit: 2014-07-03 | Discharge: 2014-07-03 | Disposition: A | Discharge status: Home / Self Care | Payer: Medicaid Other | Source: Ambulatory Visit | Attending: General Surgery | Admitting: General Surgery

## 2014-07-03 ENCOUNTER — Encounter (HOSPITAL_COMMUNITY): Payer: Self-pay

## 2014-07-03 DIAGNOSIS — Z01810 Encounter for preprocedural cardiovascular examination: Secondary | ICD-10-CM | POA: Diagnosis not present

## 2014-07-03 DIAGNOSIS — Z01812 Encounter for preprocedural laboratory examination: Secondary | ICD-10-CM | POA: Diagnosis not present

## 2014-07-03 DIAGNOSIS — F3289 Other specified depressive episodes: Secondary | ICD-10-CM | POA: Diagnosis not present

## 2014-07-03 DIAGNOSIS — Z01818 Encounter for other preprocedural examination: Secondary | ICD-10-CM | POA: Diagnosis not present

## 2014-07-03 DIAGNOSIS — K8020 Calculus of gallbladder without cholecystitis without obstruction: Secondary | ICD-10-CM | POA: Diagnosis present

## 2014-07-03 DIAGNOSIS — F329 Major depressive disorder, single episode, unspecified: Secondary | ICD-10-CM | POA: Diagnosis not present

## 2014-07-03 DIAGNOSIS — I10 Essential (primary) hypertension: Secondary | ICD-10-CM | POA: Diagnosis not present

## 2014-07-03 DIAGNOSIS — K8010 Calculus of gallbladder with chronic cholecystitis without obstruction: Secondary | ICD-10-CM | POA: Diagnosis not present

## 2014-07-03 DIAGNOSIS — F17200 Nicotine dependence, unspecified, uncomplicated: Secondary | ICD-10-CM | POA: Diagnosis not present

## 2014-07-03 DIAGNOSIS — F411 Generalized anxiety disorder: Secondary | ICD-10-CM | POA: Diagnosis not present

## 2014-07-03 DIAGNOSIS — Z880 Allergy status to penicillin: Secondary | ICD-10-CM | POA: Diagnosis not present

## 2014-07-03 DIAGNOSIS — K219 Gastro-esophageal reflux disease without esophagitis: Secondary | ICD-10-CM | POA: Diagnosis not present

## 2014-07-03 DIAGNOSIS — M129 Arthropathy, unspecified: Secondary | ICD-10-CM | POA: Diagnosis not present

## 2014-07-03 DIAGNOSIS — IMO0002 Reserved for concepts with insufficient information to code with codable children: Secondary | ICD-10-CM | POA: Diagnosis not present

## 2014-07-03 HISTORY — DX: Major depressive disorder, single episode, unspecified: F32.9

## 2014-07-03 HISTORY — DX: Calculus of gallbladder without cholecystitis without obstruction: K80.20

## 2014-07-03 HISTORY — DX: Gastro-esophageal reflux disease without esophagitis: K21.9

## 2014-07-03 HISTORY — DX: Anxiety disorder, unspecified: F41.9

## 2014-07-03 HISTORY — DX: Depression, unspecified: F32.A

## 2014-07-03 LAB — COMPREHENSIVE METABOLIC PANEL
ALT: 21 U/L (ref 0–35)
ANION GAP: 17 — AB (ref 5–15)
AST: 27 U/L (ref 0–37)
Albumin: 3.6 g/dL (ref 3.5–5.2)
Alkaline Phosphatase: 79 U/L (ref 39–117)
BUN: 9 mg/dL (ref 6–23)
CO2: 17 mEq/L — ABNORMAL LOW (ref 19–32)
Calcium: 8.8 mg/dL (ref 8.4–10.5)
Chloride: 107 mEq/L (ref 96–112)
Creatinine, Ser: 0.47 mg/dL — ABNORMAL LOW (ref 0.50–1.10)
GFR calc non Af Amer: >90 mL/min (ref >90)
GLUCOSE: 137 mg/dL — AB (ref 70–99)
Potassium: 3 mEq/L — ABNORMAL LOW (ref 3.7–5.3)
SODIUM: 141 meq/L (ref 137–147)
TOTAL PROTEIN: 7 g/dL (ref 6.0–8.3)
Total Bilirubin: 0.6 mg/dL (ref 0.3–1.2)

## 2014-07-03 LAB — CBC WITH DIFFERENTIAL/PLATELET
Basophils Absolute: 0 10*3/uL (ref 0.0–0.1)
Basophils Relative: 0 % (ref 0–1)
EOS ABS: 0 10*3/uL (ref 0.0–0.7)
Eosinophils Relative: 0 % (ref 0–5)
HCT: 39.6 % (ref 36.0–46.0)
Hemoglobin: 14.1 g/dL (ref 12.0–15.0)
LYMPHS ABS: 4.1 10*3/uL — AB (ref 0.7–4.0)
Lymphocytes Relative: 45 % (ref 12–46)
MCH: 31.4 pg (ref 26.0–34.0)
MCHC: 35.6 g/dL (ref 30.0–36.0)
MCV: 88.2 fL (ref 78.0–100.0)
Monocytes Absolute: 0.7 10*3/uL (ref 0.1–1.0)
Monocytes Relative: 7 % (ref 3–12)
Neutro Abs: 4.2 10*3/uL (ref 1.7–7.7)
Neutrophils Relative %: 48 % (ref 43–77)
PLATELETS: 152 10*3/uL (ref 150–400)
RBC: 4.49 MIL/uL (ref 3.87–5.11)
RDW: 14 % (ref 11.5–15.5)
WBC: 9 10*3/uL (ref 4.0–10.5)

## 2014-07-04 MED ORDER — CIPROFLOXACIN IN D5W 400 MG/200ML IV SOLN
400.0000 mg | INTRAVENOUS | Status: AC
  Administered 2014-07-05: 400 mg via INTRAVENOUS
  Filled 2014-07-04: qty 200

## 2014-07-05 ENCOUNTER — Observation Stay (HOSPITAL_COMMUNITY)
Admission: RE | Admit: 2014-07-05 | Discharge: 2014-07-06 | Disposition: A | Discharge status: Home / Self Care | Payer: Medicaid Other | Source: Ambulatory Visit | Attending: General Surgery | Admitting: General Surgery

## 2014-07-05 ENCOUNTER — Ambulatory Visit (HOSPITAL_COMMUNITY): Payer: Medicaid Other

## 2014-07-05 ENCOUNTER — Encounter (HOSPITAL_COMMUNITY): Payer: Medicaid Other | Admitting: Certified Registered"

## 2014-07-05 ENCOUNTER — Encounter (HOSPITAL_COMMUNITY)
Admission: RE | Disposition: A | Discharge status: Home / Self Care | Payer: Self-pay | Source: Ambulatory Visit | Attending: General Surgery

## 2014-07-05 ENCOUNTER — Encounter (HOSPITAL_COMMUNITY): Payer: Self-pay | Admitting: *Deleted

## 2014-07-05 ENCOUNTER — Ambulatory Visit (HOSPITAL_COMMUNITY): Payer: Medicaid Other | Admitting: Certified Registered"

## 2014-07-05 DIAGNOSIS — Z880 Allergy status to penicillin: Secondary | ICD-10-CM | POA: Insufficient documentation

## 2014-07-05 DIAGNOSIS — IMO0002 Reserved for concepts with insufficient information to code with codable children: Secondary | ICD-10-CM | POA: Insufficient documentation

## 2014-07-05 DIAGNOSIS — I10 Essential (primary) hypertension: Secondary | ICD-10-CM | POA: Diagnosis not present

## 2014-07-05 DIAGNOSIS — Z9049 Acquired absence of other specified parts of digestive tract: Secondary | ICD-10-CM

## 2014-07-05 DIAGNOSIS — M549 Dorsalgia, unspecified: Secondary | ICD-10-CM | POA: Diagnosis present

## 2014-07-05 DIAGNOSIS — F3289 Other specified depressive episodes: Secondary | ICD-10-CM | POA: Diagnosis not present

## 2014-07-05 DIAGNOSIS — K8010 Calculus of gallbladder with chronic cholecystitis without obstruction: Secondary | ICD-10-CM | POA: Diagnosis not present

## 2014-07-05 DIAGNOSIS — M129 Arthropathy, unspecified: Secondary | ICD-10-CM | POA: Insufficient documentation

## 2014-07-05 DIAGNOSIS — F329 Major depressive disorder, single episode, unspecified: Secondary | ICD-10-CM | POA: Diagnosis not present

## 2014-07-05 DIAGNOSIS — Z01810 Encounter for preprocedural cardiovascular examination: Secondary | ICD-10-CM | POA: Insufficient documentation

## 2014-07-05 DIAGNOSIS — K811 Chronic cholecystitis: Secondary | ICD-10-CM | POA: Diagnosis present

## 2014-07-05 DIAGNOSIS — K219 Gastro-esophageal reflux disease without esophagitis: Secondary | ICD-10-CM | POA: Insufficient documentation

## 2014-07-05 DIAGNOSIS — F411 Generalized anxiety disorder: Secondary | ICD-10-CM | POA: Insufficient documentation

## 2014-07-05 DIAGNOSIS — Z01812 Encounter for preprocedural laboratory examination: Secondary | ICD-10-CM | POA: Insufficient documentation

## 2014-07-05 DIAGNOSIS — Z01818 Encounter for other preprocedural examination: Secondary | ICD-10-CM | POA: Insufficient documentation

## 2014-07-05 DIAGNOSIS — F32A Depression, unspecified: Secondary | ICD-10-CM | POA: Diagnosis present

## 2014-07-05 DIAGNOSIS — F17200 Nicotine dependence, unspecified, uncomplicated: Secondary | ICD-10-CM | POA: Insufficient documentation

## 2014-07-05 HISTORY — DX: Low back pain: M54.5

## 2014-07-05 HISTORY — PX: CHOLECYSTECTOMY: SHX55

## 2014-07-05 HISTORY — PX: LAPAROSCOPIC CHOLECYSTECTOMY: SUR755

## 2014-07-05 HISTORY — DX: Low back pain, unspecified: M54.50

## 2014-07-05 HISTORY — DX: Unspecified chronic bronchitis: J42

## 2014-07-05 HISTORY — DX: Other chronic pain: G89.29

## 2014-07-05 HISTORY — DX: Schizophrenia, unspecified: F20.9

## 2014-07-05 HISTORY — DX: Bipolar disorder, unspecified: F31.9

## 2014-07-05 HISTORY — DX: Pure hypercholesterolemia, unspecified: E78.00

## 2014-07-05 SURGERY — LAPAROSCOPIC CHOLECYSTECTOMY WITH INTRAOPERATIVE CHOLANGIOGRAM
Anesthesia: General

## 2014-07-05 MED ORDER — BUPIVACAINE-EPINEPHRINE (PF) 0.25% -1:200000 IJ SOLN
INTRAMUSCULAR | Status: AC
  Filled 2014-07-05: qty 30

## 2014-07-05 MED ORDER — METHOCARBAMOL 500 MG PO TABS
ORAL_TABLET | ORAL | Status: AC
  Administered 2014-07-05: 750 mg via ORAL
  Filled 2014-07-05: qty 2

## 2014-07-05 MED ORDER — BUPROPION HCL ER (XL) 150 MG PO TB24
150.0000 mg | ORAL_TABLET | Freq: Every day | ORAL | Status: DC
  Administered 2014-07-05 – 2014-07-06 (×2): 150 mg via ORAL
  Filled 2014-07-05 (×2): qty 1

## 2014-07-05 MED ORDER — DEXAMETHASONE SODIUM PHOSPHATE 4 MG/ML IJ SOLN
INTRAMUSCULAR | Status: DC | PRN
  Administered 2014-07-05: 4 mg via INTRAVENOUS

## 2014-07-05 MED ORDER — NEOSTIGMINE METHYLSULFATE 10 MG/10ML IV SOLN
INTRAVENOUS | Status: DC | PRN
  Administered 2014-07-05: 3 mg via INTRAVENOUS

## 2014-07-05 MED ORDER — ARTIFICIAL TEARS OP OINT
TOPICAL_OINTMENT | OPHTHALMIC | Status: DC | PRN
  Administered 2014-07-05: 1 via OPHTHALMIC

## 2014-07-05 MED ORDER — ONDANSETRON HCL 4 MG/2ML IJ SOLN
INTRAMUSCULAR | Status: DC | PRN
  Administered 2014-07-05: 4 mg via INTRAVENOUS

## 2014-07-05 MED ORDER — PROPOFOL 10 MG/ML IV BOLUS
INTRAVENOUS | Status: DC | PRN
  Administered 2014-07-05: 100 mg via INTRAVENOUS
  Administered 2014-07-05 (×2): 50 mg via INTRAVENOUS

## 2014-07-05 MED ORDER — GLYCOPYRROLATE 0.2 MG/ML IJ SOLN
INTRAMUSCULAR | Status: AC
  Filled 2014-07-05: qty 3

## 2014-07-05 MED ORDER — OXYCODONE-ACETAMINOPHEN 5-325 MG PO TABS
ORAL_TABLET | ORAL | Status: AC
  Administered 2014-07-05: 2 via ORAL
  Filled 2014-07-05: qty 2

## 2014-07-05 MED ORDER — HYDROMORPHONE HCL PF 1 MG/ML IJ SOLN
INTRAMUSCULAR | Status: AC
  Administered 2014-07-05: 0.5 mg via INTRAVENOUS
  Filled 2014-07-05: qty 1

## 2014-07-05 MED ORDER — LIDOCAINE HCL (CARDIAC) 20 MG/ML IV SOLN
INTRAVENOUS | Status: AC
  Filled 2014-07-05: qty 5

## 2014-07-05 MED ORDER — METHOCARBAMOL 750 MG PO TABS
750.0000 mg | ORAL_TABLET | Freq: Two times a day (BID) | ORAL | Status: DC | PRN
  Administered 2014-07-05: 750 mg via ORAL
  Filled 2014-07-05: qty 1

## 2014-07-05 MED ORDER — MORPHINE SULFATE 2 MG/ML IJ SOLN
1.0000 mg | INTRAMUSCULAR | Status: DC | PRN
  Administered 2014-07-05 – 2014-07-06 (×3): 2 mg via INTRAVENOUS
  Filled 2014-07-05 (×3): qty 1

## 2014-07-05 MED ORDER — LIDOCAINE HCL (CARDIAC) 20 MG/ML IV SOLN
INTRAVENOUS | Status: DC | PRN
  Administered 2014-07-05: 40 mg via INTRAVENOUS

## 2014-07-05 MED ORDER — STERILE WATER FOR INJECTION IJ SOLN
INTRAMUSCULAR | Status: AC
  Filled 2014-07-05: qty 10

## 2014-07-05 MED ORDER — GLYCOPYRROLATE 0.2 MG/ML IJ SOLN
INTRAMUSCULAR | Status: DC | PRN
  Administered 2014-07-05: 0.4 mg via INTRAVENOUS

## 2014-07-05 MED ORDER — DEXAMETHASONE SODIUM PHOSPHATE 4 MG/ML IJ SOLN
INTRAMUSCULAR | Status: AC
  Filled 2014-07-05: qty 1

## 2014-07-05 MED ORDER — 0.9 % SODIUM CHLORIDE (POUR BTL) OPTIME
TOPICAL | Status: DC | PRN
  Administered 2014-07-05: 1000 mL

## 2014-07-05 MED ORDER — HYDROMORPHONE HCL PF 1 MG/ML IJ SOLN
0.2500 mg | INTRAMUSCULAR | Status: DC | PRN
  Administered 2014-07-05 (×2): 0.5 mg via INTRAVENOUS

## 2014-07-05 MED ORDER — TRAMADOL HCL 50 MG PO TABS: 50.0000 mg | ORAL_TABLET | Freq: Two times a day (BID) | ORAL | Status: DC | PRN

## 2014-07-05 MED ORDER — MIDAZOLAM HCL 5 MG/5ML IJ SOLN
INTRAMUSCULAR | Status: DC | PRN
  Administered 2014-07-05 (×2): 1 mg via INTRAVENOUS

## 2014-07-05 MED ORDER — FUROSEMIDE 20 MG PO TABS
20.0000 mg | ORAL_TABLET | Freq: Every day | ORAL | Status: DC
  Administered 2014-07-05 – 2014-07-06 (×2): 20 mg via ORAL
  Filled 2014-07-05 (×2): qty 1

## 2014-07-05 MED ORDER — LACTATED RINGERS IV SOLN
INTRAVENOUS | Status: DC | PRN
  Administered 2014-07-05: 08:00:00 via INTRAVENOUS

## 2014-07-05 MED ORDER — ROCURONIUM BROMIDE 100 MG/10ML IV SOLN
INTRAVENOUS | Status: DC | PRN
  Administered 2014-07-05: 30 mg via INTRAVENOUS

## 2014-07-05 MED ORDER — TRAZODONE HCL 50 MG PO TABS: 50.0000 mg | ORAL_TABLET | Freq: Every evening | ORAL | Status: DC | PRN

## 2014-07-05 MED ORDER — IOHEXOL 300 MG/ML  SOLN
INTRAMUSCULAR | Status: DC | PRN
  Administered 2014-07-05: 10:00:00

## 2014-07-05 MED ORDER — ONDANSETRON HCL 4 MG/2ML IJ SOLN: 4.0000 mg | Freq: Once | INTRAMUSCULAR | Status: DC | PRN

## 2014-07-05 MED ORDER — FENTANYL CITRATE 0.05 MG/ML IJ SOLN
INTRAMUSCULAR | Status: AC
  Filled 2014-07-05: qty 5

## 2014-07-05 MED ORDER — RISPERIDONE 1 MG PO TABS
1.5000 mg | ORAL_TABLET | Freq: Every day | ORAL | Status: DC
  Administered 2014-07-06: 1.5 mg via ORAL
  Filled 2014-07-05 (×3): qty 1

## 2014-07-05 MED ORDER — OXYCODONE-ACETAMINOPHEN 5-325 MG PO TABS
1.0000 | ORAL_TABLET | ORAL | Status: DC | PRN
  Administered 2014-07-05 – 2014-07-06 (×4): 2 via ORAL
  Filled 2014-07-05 (×3): qty 2

## 2014-07-05 MED ORDER — SUCCINYLCHOLINE CHLORIDE 20 MG/ML IJ SOLN
INTRAMUSCULAR | Status: AC
  Filled 2014-07-05: qty 1

## 2014-07-05 MED ORDER — FENTANYL CITRATE 0.05 MG/ML IJ SOLN
INTRAMUSCULAR | Status: DC | PRN
  Administered 2014-07-05: 100 ug via INTRAVENOUS
  Administered 2014-07-05 (×3): 50 ug via INTRAVENOUS

## 2014-07-05 MED ORDER — ROCURONIUM BROMIDE 50 MG/5ML IV SOLN
INTRAVENOUS | Status: AC
  Filled 2014-07-05: qty 1

## 2014-07-05 MED ORDER — OXYCODONE HCL 5 MG/5ML PO SOLN: 5.0000 mg | Freq: Once | ORAL | Status: DC | PRN

## 2014-07-05 MED ORDER — BUPIVACAINE-EPINEPHRINE 0.25% -1:200000 IJ SOLN
INTRAMUSCULAR | Status: DC | PRN
  Administered 2014-07-05: 10 mL

## 2014-07-05 MED ORDER — OXYCODONE HCL 5 MG PO TABS: 5.0000 mg | ORAL_TABLET | Freq: Once | ORAL | Status: DC | PRN

## 2014-07-05 MED ORDER — PROPOFOL 10 MG/ML IV BOLUS
INTRAVENOUS | Status: AC
  Filled 2014-07-05: qty 20

## 2014-07-05 MED ORDER — POTASSIUM CHLORIDE CRYS ER 20 MEQ PO TBCR
20.0000 meq | EXTENDED_RELEASE_TABLET | Freq: Every day | ORAL | Status: DC
  Administered 2014-07-05 – 2014-07-06 (×2): 20 meq via ORAL
  Filled 2014-07-05 (×2): qty 1

## 2014-07-05 MED ORDER — MIDAZOLAM HCL 2 MG/2ML IJ SOLN
INTRAMUSCULAR | Status: AC
  Filled 2014-07-05: qty 2

## 2014-07-05 MED ORDER — PNEUMOCOCCAL VAC POLYVALENT 25 MCG/0.5ML IJ INJ
0.5000 mL | INJECTION | INTRAMUSCULAR | Status: AC
  Administered 2014-07-06: 0.5 mL via INTRAMUSCULAR
  Filled 2014-07-05 (×2): qty 0.5

## 2014-07-05 MED ORDER — PANTOPRAZOLE SODIUM 40 MG PO TBEC
40.0000 mg | DELAYED_RELEASE_TABLET | Freq: Every day | ORAL | Status: DC
  Administered 2014-07-05 – 2014-07-06 (×2): 40 mg via ORAL
  Filled 2014-07-05 (×2): qty 1

## 2014-07-05 MED ORDER — NEOSTIGMINE METHYLSULFATE 10 MG/10ML IV SOLN
INTRAVENOUS | Status: AC
  Filled 2014-07-05: qty 1

## 2014-07-05 MED ORDER — POTASSIUM CHLORIDE IN NACL 20-0.9 MEQ/L-% IV SOLN
INTRAVENOUS | Status: DC
  Administered 2014-07-05: 14:00:00 via INTRAVENOUS
  Filled 2014-07-05 (×3): qty 1000

## 2014-07-05 MED ORDER — EPHEDRINE SULFATE 50 MG/ML IJ SOLN
INTRAMUSCULAR | Status: AC
  Filled 2014-07-05: qty 1

## 2014-07-05 MED ORDER — ENOXAPARIN SODIUM 40 MG/0.4ML ~~LOC~~ SOLN
40.0000 mg | SUBCUTANEOUS | Status: DC
  Administered 2014-07-06: 40 mg via SUBCUTANEOUS
  Filled 2014-07-05 (×2): qty 0.4

## 2014-07-05 MED ORDER — IBUPROFEN 800 MG PO TABS: 800.0000 mg | ORAL_TABLET | Freq: Three times a day (TID) | ORAL | Status: DC | PRN

## 2014-07-05 MED ORDER — SODIUM CHLORIDE 0.9 % IR SOLN
Status: DC | PRN
  Administered 2014-07-05: 1000 mL

## 2014-07-05 MED ORDER — FLUTICASONE PROPIONATE 50 MCG/ACT NA SUSP
2.0000 | Freq: Every day | NASAL | Status: DC
  Administered 2014-07-05 – 2014-07-06 (×2): 2 via NASAL
  Filled 2014-07-05: qty 16

## 2014-07-05 SURGICAL SUPPLY — 45 items
APL SKNCLS STERI-STRIP NONHPOA (GAUZE/BANDAGES/DRESSINGS) ×1
APPLIER CLIP 5 13 M/L LIGAMAX5 (MISCELLANEOUS) ×3
APR CLP MED LRG 5 ANG JAW (MISCELLANEOUS) ×1
BAG SPEC RTRVL LRG 6X4 10 (ENDOMECHANICALS) ×1
BANDAGE ADH SHEER 1  50/CT (GAUZE/BANDAGES/DRESSINGS) ×9 IMPLANT
BENZOIN TINCTURE PRP APPL 2/3 (GAUZE/BANDAGES/DRESSINGS) ×3 IMPLANT
BLADE SURG ROTATE 9660 (MISCELLANEOUS) IMPLANT
CANISTER SUCTION 2500CC (MISCELLANEOUS) ×3 IMPLANT
CHLORAPREP W/TINT 26ML (MISCELLANEOUS) ×3 IMPLANT
CLIP APPLIE 5 13 M/L LIGAMAX5 (MISCELLANEOUS) ×1 IMPLANT
CLOSURE WOUND 1/2 X4 (GAUZE/BANDAGES/DRESSINGS) ×1
COVER MAYO STAND STRL (DRAPES) ×3 IMPLANT
COVER SURGICAL LIGHT HANDLE (MISCELLANEOUS) ×3 IMPLANT
DECANTER SPIKE VIAL GLASS SM (MISCELLANEOUS) ×3 IMPLANT
DRAPE C-ARM 42X72 X-RAY (DRAPES) ×3 IMPLANT
DRAPE UTILITY 15X26 W/TAPE STR (DRAPE) ×6 IMPLANT
DRSG TEGADERM 4X4.75 (GAUZE/BANDAGES/DRESSINGS) ×3 IMPLANT
ELECT REM PT RETURN 9FT ADLT (ELECTROSURGICAL) ×3
ELECTRODE REM PT RTRN 9FT ADLT (ELECTROSURGICAL) ×1 IMPLANT
GAUZE SPONGE 2X2 8PLY STRL LF (GAUZE/BANDAGES/DRESSINGS) ×1 IMPLANT
GLOVE BIOGEL M STRL SZ7.5 (GLOVE) ×3 IMPLANT
GLOVE BIOGEL PI IND STRL 8 (GLOVE) ×1 IMPLANT
GLOVE BIOGEL PI INDICATOR 8 (GLOVE) ×2
GOWN STRL REUS W/ TWL LRG LVL3 (GOWN DISPOSABLE) ×3 IMPLANT
GOWN STRL REUS W/ TWL XL LVL3 (GOWN DISPOSABLE) ×1 IMPLANT
GOWN STRL REUS W/TWL LRG LVL3 (GOWN DISPOSABLE) ×9
GOWN STRL REUS W/TWL XL LVL3 (GOWN DISPOSABLE) ×3
KIT BASIN OR (CUSTOM PROCEDURE TRAY) ×3 IMPLANT
KIT ROOM TURNOVER OR (KITS) ×3 IMPLANT
NS IRRIG 1000ML POUR BTL (IV SOLUTION) ×3 IMPLANT
PAD ARMBOARD 7.5X6 YLW CONV (MISCELLANEOUS) ×3 IMPLANT
POUCH SPECIMEN RETRIEVAL 10MM (ENDOMECHANICALS) ×3 IMPLANT
SCISSORS LAP 5X35 DISP (ENDOMECHANICALS) ×3 IMPLANT
SET CHOLANGIOGRAPH 5 50 .035 (SET/KITS/TRAYS/PACK) ×3 IMPLANT
SET IRRIG TUBING LAPAROSCOPIC (IRRIGATION / IRRIGATOR) ×3 IMPLANT
SLEEVE ENDOPATH XCEL 5M (ENDOMECHANICALS) ×6 IMPLANT
SPECIMEN JAR SMALL (MISCELLANEOUS) ×3 IMPLANT
SPONGE GAUZE 2X2 STER 10/PKG (GAUZE/BANDAGES/DRESSINGS) ×2
STRIP CLOSURE SKIN 1/2X4 (GAUZE/BANDAGES/DRESSINGS) ×1 IMPLANT
SUT MNCRL AB 4-0 PS2 18 (SUTURE) ×3 IMPLANT
TOWEL OR 17X24 6PK STRL BLUE (TOWEL DISPOSABLE) ×3 IMPLANT
TOWEL OR 17X26 10 PK STRL BLUE (TOWEL DISPOSABLE) ×3 IMPLANT
TRAY LAPAROSCOPIC (CUSTOM PROCEDURE TRAY) ×3 IMPLANT
TROCAR XCEL BLUNT TIP 100MML (ENDOMECHANICALS) ×3 IMPLANT
TROCAR XCEL NON-BLD 5MMX100MML (ENDOMECHANICALS) ×3 IMPLANT

## 2014-07-05 NOTE — Anesthesia Postprocedure Evaluation (Signed)
  Anesthesia Post-op Note  Patient: Faith [name]  Procedure(s) Performed: Procedure(s): LAPAROSCOPIC CHOLECYSTECTOMY WITH INTRAOPERATIVE CHOLANGIOGRAM (N/A)  Patient Location: PACU  Anesthesia Type: General   Level of Consciousness: awake, alert  and oriented  Airway and Oxygen Therapy: Patient Spontanous Breathing  Post-op Pain: mild  Post-op Assessment: Post-op Vital signs reviewed  Post-op Vital Signs: Reviewed  Last Vitals:  Filed Vitals:   07/05/14 1015  BP: 128/55  Pulse: 76  Temp:   Resp: 20    Complications: No apparent anesthesia complications

## 2014-07-05 NOTE — Anesthesia Procedure Notes (Signed)
Procedure Name: Intubation Date/Time: 07/05/2014 8:46 AM Performed by: Susa Loffler Pre-anesthesia Checklist: Patient identified, Timeout performed, Emergency Drugs available, Suction available and Patient being monitored Patient Re-evaluated:Patient Re-evaluated prior to inductionOxygen Delivery Method: Circle system utilized Preoxygenation: Pre-oxygenation with 100% oxygen Intubation Type: IV induction Ventilation: Mask ventilation without difficulty and Oral airway inserted - appropriate to patient size Laryngoscope Size: Mac and 3 Grade View: Grade I Tube type: Oral Tube size: 7.5 mm Number of attempts: 1 Airway Equipment and Method: Stylet and Oral airway Placement Confirmation: ETT inserted through vocal cords under direct vision,  positive ETCO2 and breath sounds checked- equal and bilateral Secured at: 20 cm Tube secured with: Tape Dental Injury: Teeth and Oropharynx as per pre-operative assessment

## 2014-07-05 NOTE — Transfer of Care (Signed)
Immediate Anesthesia Transfer of Care Note  Patient: Faith [name]  Procedure(s) Performed: Procedure(s): LAPAROSCOPIC CHOLECYSTECTOMY WITH INTRAOPERATIVE CHOLANGIOGRAM (N/A)  Patient Location: PACU  Anesthesia Type:General  Level of Consciousness: awake, alert  and oriented  Airway & Oxygen Therapy: Patient Spontanous Breathing and Patient connected to nasal cannula oxygen  Post-op Assessment: Report given to PACU RN and Post -op Vital signs reviewed and stable  Post vital signs: Reviewed and stable  Complications: No apparent anesthesia complications

## 2014-07-05 NOTE — Op Note (Signed)
Faith [name] [id number] [birth year] 07/05/2014  Laparoscopic Cholecystectomy with IOC Procedure Note  Indications: This patient presents with symptomatic gallbladder disease and will undergo laparoscopic cholecystectomy. Please see my h&P for additional information.   Pre-operative Diagnosis: symptomatic cholelithiasis, abdominal wall adhesions  Post-operative Diagnosis: Same  Surgeon: Gayland Curry   Assistants: Algis Greenhouse, RNFA  Anesthesia: General endotracheal anesthesia  ASA Class: 3  Procedure Details  The patient was seen again in the Holding Room. The risks, benefits, complications, treatment options, and expected outcomes were discussed with the patient. The possibilities of reaction to medication, pulmonary aspiration, perforation of viscus, bleeding, recurrent infection, finding a normal gallbladder, the need for additional procedures, failure to diagnose a condition, the possible need to convert to an open procedure, and creating a complication requiring transfusion or operation were discussed with the patient. The likelihood of improving the patient's symptoms with return to their baseline status is good.  The patient and/or family concurred with the proposed plan, giving informed consent. The site of surgery properly noted. The patient was taken to Operating Room, identified as Faith [name] and the procedure verified as Laparoscopic Cholecystectomy with Intraoperative Cholangiogram. A Time Out was held and the above information confirmed. Antibiotic prophylaxis was administered.   Prior to the induction of general anesthesia, antibiotic prophylaxis was administered. General endotracheal anesthesia was then administered and tolerated well. After the induction, the abdomen was prepped with Chloraprep and draped in the sterile fashion. The patient was positioned in the supine position.  Local anesthetic agent was injected into the skin near the umbilicus and an incision made. We  dissected down to the abdominal fascia with blunt dissection.  The fascia was incised vertically and we entered the peritoneal cavity bluntly.  A pursestring suture of 0-Vicryl was placed around the fascial opening.  The Hasson cannula was inserted and secured with the stay suture.  Pneumoperitoneum was then created with CO2 and tolerated well without any adverse changes in the patient's vital signs. Upon insertion of the laparoscope there is evidence of some omental adhesions around the umbilicus however I was able to navigate the laparoscope up into the right upper quadrant. An 5-mm port was placed in the subxiphoid position.  Two 5-mm ports were placed in the right upper quadrant. All skin incisions were infiltrated with a local anesthetic agent before making the incision and placing the trocars.   We positioned the patient in reverse Trendelenburg, tilted slightly to the patient's left.  The gallbladder was identified, the fundus grasped and retracted cephalad. Adhesions were lysed bluntly and with the electrocautery where indicated, taking care not to injure any adjacent organs or viscus. The infundibulum was grasped and retracted laterally, exposing the peritoneum overlying the triangle of Calot. This was then divided and exposed in a blunt fashion. A critical view of the cystic duct and cystic artery was obtained.  The cystic duct was clearly identified and bluntly dissected circumferentially. There were no other structures entering the gallbladder. The cystic artery did crawl somewhat anteriorly to the cystic duct. I felt in order to best perform a cholangiogram note I would need to go ahead and ligated the cystic artery. 3 clips were placed on the down side of the cystic artery and an additional clip was placed on the cystic artery as it entered the gallbladder. It was then transected. The cystic duct was ligated with a clip distally.   An incision was made in the cystic duct and the [facility] cholangiogram  catheter introduced. The catheter was  secured using a clip. A cholangiogram was then obtained which showed good visualization of the distal and proximal biliary tree with no sign of filling defects or obstruction.  Contrast flowed easily into the duodenum. The catheter was then removed.   The gallbladder was dissected from the liver bed in retrograde fashion with the electrocautery. The gallbladder was removed and placed in an Endocatch sac.  The gallbladder and Endocatch sac were then removed through the umbilical port site. The liver bed was irrigated and inspected. Hemostasis was achieved with the electrocautery. Because the patient also had mid abdominal discomfort as well I decided to lyse the omental adhesions around the umbilicus. There is no evidence of small bowel adhered to the abdominal wall. The laparoscope had been placed in the subxiphoid trocar. I then used the 2 right upper quadrant trochars to retract the omentum and lyse it from the abdominal wall with Endo Shears without electrocautery.  The pursestring suture was used to close the umbilical fascia.  An additional 0 Vicryl was placed at the umbilical fascia  We again inspected the right upper quadrant for hemostasis.  The umbilical closure was inspected and there was no air leak and nothing trapped within the closure. Pneumoperitoneum was released as we removed the trocars.  4-0 Monocryl was used to close the skin.   Benzoin, steri-strips, and clean dressings were applied. The patient was then extubated and brought to the recovery room in stable condition. Instrument, sponge, and needle counts were correct at closure and at the conclusion of the case.   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          Disposition: PACU - hemodynamically stable.         Condition: stable  Leighton Ruff. Redmond Pulling, MD, FACS General, Bariatric, & Minimally Invasive Surgery [facility]  Note: This dictation was prepared with Dragon/digital dictation along with [facility] technology. Any transcriptional errors that result from this process are unintentional.

## 2014-07-05 NOTE — Discharge Instructions (Signed)
[facility] LAPAROSCOPIC [facility]: POST OP INSTRUCTIONS Always review your discharge instruction sheet given to you by the facility where your surgery was performed. IF YOU HAVE DISABILITY OR FAMILY LEAVE FORMS, YOU MUST BRING THEM TO THE OFFICE FOR PROCESSING.   DO NOT GIVE THEM TO YOUR DOCTOR.  1. A prescription for pain medication may be given to you upon discharge.  Take your pain medication as prescribed, if needed.  If narcotic pain medicine is not needed, then you may take acetaminophen (Tylenol) or ibuprofen (Advil) as needed. 2. Take your usually prescribed medications unless otherwise directed. 3. If you need a refill on your pain medication, please contact your pharmacy.  They will contact our office to request authorization. Prescriptions will not be filled after 5pm or on week-ends. 4. You should follow a light diet the first few days after arrival home, such as soup and crackers, etc.  Be sure to include lots of fluids daily. 5. Most patients will experience some swelling and bruising in the area of the incisions.  Ice packs will help.  Swelling and bruising can take several days to resolve.  6. It is common to experience some constipation if taking pain medication after surgery.  Increasing fluid intake and taking a stool softener (such as Colace) will usually help or prevent this problem from occurring.  A mild laxative (Milk of Magnesia or Miralax) should be taken according to package instructions if there are no bowel movements after 48 hours. 7. Unless discharge instructions indicate otherwise, you may remove your bandages 48 hours after surgery, and you may shower at that time.  You  have steri-strips (small skin tapes) in place directly over the incision.  These strips should be left on the skin for 7-10 days.  If your surgeon used skin glue on the incision, you may shower in 24 hours.  The glue will flake off over the next 2-3 weeks.  Any sutures or staples  will be removed at the office during your follow-up visit. 8. ACTIVITIES:  You may resume regular (light) daily activities beginning the next day--such as daily self-care, walking, climbing stairs--gradually increasing activities as tolerated.  You may have sexual intercourse when it is comfortable.  Refrain from any heavy lifting or straining until approved by your doctor. a. You may drive when you are no longer taking prescription pain medication, you can comfortably wear a seatbelt, and you can safely maneuver your car and apply brakes. 9. You should see your doctor in the office for a follow-up appointment approximately 2-3 weeks after your surgery.  Make sure that you call for this appointment within a day or two after you arrive home to insure a convenient appointment time. 10. OTHER INSTRUCTIONS:  WHEN TO CALL YOUR DOCTOR: 1. Fever over 101.0 2. Inability to urinate 3. Continued bleeding from incision. 4. Increased pain, redness, or drainage from the incision. 5. Increasing abdominal pain  The clinic staff is available to answer your questions during regular business hours.  Please dont hesitate to call and ask to speak to one of the nurses for clinical concerns.  If you have a medical emergency, go to the nearest emergency room or call 911.  A surgeon from [facility] is always on call at the hospital. [address], [place], [place], [place]  [address] ? [address], [place],    [address] [phone] ? [phone] ? FAX [phone] Web site: [email or web address]

## 2014-07-05 NOTE — Anesthesia Preprocedure Evaluation (Addendum)
Anesthesia Evaluation  Patient identified by MRN, date of birth, ID band Patient awake    Reviewed: Allergy & Precautions, H&P , NPO status , Patient's Chart, lab work & pertinent test results  Airway Mallampati: I TM Distance: >3 FB Neck ROM: Full    Dental  (+) Edentulous Upper, Dental Advisory Given   Pulmonary Current Smoker,  breath sounds clear to auscultation        Cardiovascular hypertension, Pt. on medications Rhythm:Regular Rate:Normal     Neuro/Psych    GI/Hepatic GERD-  Medicated and Controlled,  Endo/Other    Renal/GU      Musculoskeletal   Abdominal   Peds  Hematology   Anesthesia Other Findings   Reproductive/Obstetrics                          Anesthesia Physical Anesthesia Plan  ASA: III  Anesthesia Plan: General   Post-op Pain Management:    Induction: Intravenous  Airway Management Planned: Oral ETT  Additional Equipment:   Intra-op Plan:   Post-operative Plan: Extubation in OR  Informed Consent: I have reviewed the patients History and Physical, chart, labs and discussed the procedure including the risks, benefits and alternatives for the proposed anesthesia with the patient or authorized representative who has indicated his/her understanding and acceptance.   Dental advisory given  Plan Discussed with: CRNA, Anesthesiologist and Surgeon  Anesthesia Plan Comments:         Anesthesia Quick Evaluation

## 2014-07-05 NOTE — H&P (Signed)
Faith [name] is an 63 y.o. female.   Chief Complaint: here for surgery HPI: 63 yo AAF referred by Dr Jeanie Cooks for evaluation of abdominal pain. The patient reports a several month history of right sided pain. She states her pain is on her right side and radiates to her back. It is more in the right lower abdomen and upper abdomen. It is very intense at times. It initially was in frequent but now it is daily and constant. It is often associated with nausea and vomiting. It is worse when she has a bowel movement. She denies any chest pain. She denies any significant NSAID use. She denies any jaundice. She hasn't really found anything that makes it better. She does take chronic pain medicine and sometimes that might help a little bit. She states her appetite has been off. Her stools are very typically regular. She denies any melena or hematochezia. She underwent a colonoscopy a few months ago which showed diverticuli in the sigmoid colon as well as some hemorrhoids.she does smoke up to 2 packs of cigarettes a day.  Update since visit - ct of abd/pelvis - unremarkable  Past Medical History  Diagnosis Date  . Hypertension   . Back pain   . Arthritis   . Depression   . Anxiety   . GERD (gastroesophageal reflux disease)   . Cholelithiasis 07/2014    Past Surgical History  Procedure Laterality Date  . Abdominal hysterectomy    . Breast surgery    . Back surgery    . Ankle surgery      History reviewed. No pertinent family history. Social History:  reports that she has been smoking Cigarettes.  She has a 72 pack-year smoking history. She does not have any smokeless tobacco history on file. She reports that she drinks alcohol. She reports that she does not use illicit drugs.  Allergies:  Allergies  Allergen Reactions  . Penicillins Other (See Comments)    REACTION: Yeast infection    Medications Prior to Admission  Medication Sig Dispense Refill  . amLODipine-benazepril (LOTREL) 5-10 MG per  capsule Take 1 capsule by mouth daily.      Marland Kitchen buPROPion (WELLBUTRIN XL) 150 MG 24 hr tablet Take 150 mg by mouth daily.      . diphenhydrAMINE (BENADRYL) 25 mg capsule Take 25 mg by mouth every 6 (six) hours as needed for itching.      . estrogens, conjugated, (PREMARIN) 0.625 MG tablet Take 0.625 mg by mouth daily. Take daily for 21 days then do not take for 7 days.      . fluticasone (FLONASE) 50 MCG/ACT nasal spray Place 2 sprays into both nostrils daily.      . furosemide (LASIX) 20 MG tablet Take 20 mg by mouth daily.      Marland Kitchen ibuprofen (ADVIL,MOTRIN) 800 MG tablet Take 800 mg by mouth every 8 (eight) hours as needed for mild pain.       Marland Kitchen loratadine (CLARITIN) 10 MG tablet Take 10 mg by mouth daily.      . methocarbamol (ROBAXIN) 750 MG tablet Take 750 mg by mouth 2 (two) times daily as needed for muscle spasms.      Marland Kitchen omeprazole (PRILOSEC) 20 MG capsule Take 20 mg by mouth 2 (two) times daily before a meal.      . oxyCODONE-acetaminophen (PERCOCET) 10-325 MG per tablet Take 1 tablet by mouth every 8 (eight) hours as needed for pain.      . potassium chloride  SA (K-DUR,KLOR-CON) 20 MEQ tablet Take 1 tablet (20 mEq total) by mouth daily.  10 tablet  0  . risperiDONE (RISPERDAL) 3 MG tablet Take 1.5 mg by mouth at bedtime.      . traMADol (ULTRAM) 50 MG tablet Take 50 mg by mouth 2 (two) times daily as needed for moderate pain.       . traZODone (DESYREL) 50 MG tablet Take 50-100 mg by mouth at bedtime as needed for sleep.       Marland Kitchen UNABLE TO FIND Inject 2 each as directed every other day. Med Name: "Compound allergy injection" prescribed by Dr. [name] at [facility] in [place].  Administers one injection in each arm every other day at home.      Marland Kitchen EPINEPHrine (EPIPEN IJ) Inject 1 each as directed once as needed.        Results for orders placed during the hospital encounter of 07/03/14 (from the past 48 hour(s))  CBC WITH DIFFERENTIAL     Status: Abnormal   Collection Time     07/03/14  3:28 PM      Result Value Ref Range   WBC 9.0  4.0 - 10.5 K/uL   RBC 4.49  3.87 - 5.11 MIL/uL   Hemoglobin 14.1  12.0 - 15.0 g/dL   HCT 39.6  36.0 - 46.0 %   MCV 88.2  78.0 - 100.0 fL   MCH 31.4  26.0 - 34.0 pg   MCHC 35.6  30.0 - 36.0 g/dL   RDW 14.0  11.5 - 15.5 %   Platelets 152  150 - 400 K/uL   Neutrophils Relative % 48  43 - 77 %   Neutro Abs 4.2  1.7 - 7.7 K/uL   Lymphocytes Relative 45  12 - 46 %   Lymphs Abs 4.1 (*) 0.7 - 4.0 K/uL   Monocytes Relative 7  3 - 12 %   Monocytes Absolute 0.7  0.1 - 1.0 K/uL   Eosinophils Relative 0  0 - 5 %   Eosinophils Absolute 0.0  0.0 - 0.7 K/uL   Basophils Relative 0  0 - 1 %   Basophils Absolute 0.0  0.0 - 0.1 K/uL  COMPREHENSIVE METABOLIC PANEL     Status: Abnormal   Collection Time    07/03/14  3:28 PM      Result Value Ref Range   Sodium 141  137 - 147 mEq/L   Potassium 3.0 (*) 3.7 - 5.3 mEq/L   Chloride 107  96 - 112 mEq/L   CO2 17 (*) 19 - 32 mEq/L   Glucose, Bld 137 (*) 70 - 99 mg/dL   BUN 9  6 - 23 mg/dL   Creatinine, Ser 0.47 (*) 0.50 - 1.10 mg/dL   Calcium 8.8  8.4 - 10.5 mg/dL   Total Protein 7.0  6.0 - 8.3 g/dL   Albumin 3.6  3.5 - 5.2 g/dL   AST 27  0 - 37 U/L   ALT 21  0 - 35 U/L   Alkaline Phosphatase 79  39 - 117 U/L   Total Bilirubin 0.6  0.3 - 1.2 mg/dL   GFR calc non Af Amer >90  >90 mL/min   GFR calc Af Amer >90  >90 mL/min   Comment: (NOTE)     The eGFR has been calculated using the CKD EPI equation.     This calculation has not been validated in all clinical situations.     eGFR's persistently <90 mL/min  signify possible Chronic Kidney     Disease.   Anion gap 17 (*) 5 - 15   No results found.  Review of Systems  Constitutional: Negative for weight loss.  HENT: Negative for nosebleeds.   Eyes: Negative for blurred vision.  Respiratory: Negative for shortness of breath.   Cardiovascular: Negative for chest pain, palpitations, orthopnea and PND.       Denies DOE  Genitourinary: Negative  for dysuria and hematuria.  Musculoskeletal: Negative.   Skin: Negative for itching and rash.  Neurological: Negative for dizziness, focal weakness, seizures, loss of consciousness and headaches.       Denies TIAs, amaurosis fugax  Endo/Heme/Allergies: Does not bruise/bleed easily.  Psychiatric/Behavioral: Substance abuse: history of. The patient is not nervous/anxious.     Blood pressure 128/66, pulse 77, temperature 97.9 F (36.6 C), temperature source Oral, resp. rate 18, weight 120 lb (54.432 kg), SpO2 100.00%. Physical Exam  Vitals reviewed. Constitutional: She appears well-developed and well-nourished. No distress.  HENT:  Head: Normocephalic and atraumatic.  Eyes: Conjunctivae are normal.  Some strabimus  Neck: Neck supple.  Cardiovascular: Normal rate.   Respiratory: Effort normal and breath sounds normal. No respiratory distress.  GI: Soft. She exhibits no distension. There is no tenderness.    Musculoskeletal: She exhibits no edema and no tenderness.  Neurological: She is alert.  Skin: Skin is warm and dry. No rash noted. She is not diaphoretic. No erythema.  Psychiatric: She has a normal mood and affect. Her behavior is normal.     Assessment/Plan Symptomatic cholelithiasis HTN Tobacco dependence  To OR for Lap chole poss IOC Questions asked and answered  Leighton Ruff. Redmond Pulling, MD, FACS General, Bariatric, & Minimally Invasive Surgery [facility], [place]   [facility] M 07/05/2014, 7:35 AM

## 2014-07-06 ENCOUNTER — Encounter (HOSPITAL_COMMUNITY): Payer: Self-pay | Admitting: General Surgery

## 2014-07-06 DIAGNOSIS — M549 Dorsalgia, unspecified: Secondary | ICD-10-CM | POA: Diagnosis present

## 2014-07-06 DIAGNOSIS — F32A Depression, unspecified: Secondary | ICD-10-CM | POA: Diagnosis present

## 2014-07-06 DIAGNOSIS — F329 Major depressive disorder, single episode, unspecified: Secondary | ICD-10-CM | POA: Diagnosis present

## 2014-07-06 DIAGNOSIS — I10 Essential (primary) hypertension: Secondary | ICD-10-CM | POA: Diagnosis present

## 2014-07-06 DIAGNOSIS — K8010 Calculus of gallbladder with chronic cholecystitis without obstruction: Secondary | ICD-10-CM | POA: Diagnosis not present

## 2014-07-06 MED ORDER — OXYCODONE-ACETAMINOPHEN 10-325 MG PO TABS: 1.0000 | ORAL_TABLET | Freq: Three times a day (TID) | ORAL | Status: DC | PRN

## 2014-07-06 NOTE — Progress Notes (Signed)
Patient's IV came out while patient was walking to bathroom. Dr. Redmond Pulling paged. Ok to leave IV out per Dr. Redmond Pulling.

## 2014-07-06 NOTE — Discharge Summary (Signed)
Physician Discharge Summary  Faith [name] YOV:[id number] DOB: 1951-02-03 DOA: 07/05/2014  PCP: Philis Fendt, MD  Admit date: 07/05/2014 Discharge date: 07/06/2014  Recommendations for Outpatient Follow-up:    Follow-up Information   Follow up with Gayland Curry, MD. Schedule an appointment as soon as possible for a visit in 3 weeks. (For wound re-check)    Specialty:  General Surgery   Contact information:   [address] [place] [address] [phone]      Discharge Diagnoses:  Active Problems:   Chronic cholecystitis   HTN (hypertension)   Depression   Back pain  Surgical Procedure: Laparoscopic cholecystectomy with IOC 07/05/14  Discharge Condition: good  Disposition: home  Diet recommendation:  regular  Filed Weights   07/05/14 0625  Weight: 120 lb (54.432 kg)    Hospital Course:  The patient was brought in for planned gallbladder surgery. See operative note. She was kept overnight for observations. Her vitals remained stable. She was tolerating a diet without n/v. She had ambulated. Her pain was controlled with oral pain meds  Discharge exam BP 146/71  Pulse 67  Temp(Src) 97.6 F (36.4 C) (Oral)  Resp 16  Wt 120 lb (54.432 kg)  SpO2 100% Alert, nad cta b/l Reg Soft, expected very mild TTP, dressings intact, a little protuberant abdomen No edema   Discharge Instructions  Discharge Instructions   Call MD for:  persistant nausea and vomiting    Complete by:  As directed      Call MD for:  redness, tenderness, or signs of infection (pain, swelling, redness, odor or green/yellow discharge around incision site)    Complete by:  As directed      Call MD for:  severe uncontrolled pain    Complete by:  As directed      Call MD for:    Complete by:  As directed   Temp >101     Diet - low sodium heart healthy    Complete by:  As directed      Discharge instructions    Complete by:  As directed   See [facility] discharge instructions Remove outer  bandages on Friday. White strips on skin will fall off     Increase activity slowly    Complete by:  As directed             Medication List         amLODipine-benazepril 5-10 MG per capsule  Commonly known as:  LOTREL  Take 1 capsule by mouth daily.     buPROPion 150 MG 24 hr tablet  Commonly known as:  WELLBUTRIN XL  Take 150 mg by mouth daily.     diphenhydrAMINE 25 mg capsule  Commonly known as:  BENADRYL  Take 25 mg by mouth every 6 (six) hours as needed for itching.     EPIPEN IJ  Inject 1 each as directed once as needed.     estrogens (conjugated) 0.625 MG tablet  Commonly known as:  PREMARIN  Take 0.625 mg by mouth daily. Take daily for 21 days then do not take for 7 days.     fluticasone 50 MCG/ACT nasal spray  Commonly known as:  FLONASE  Place 2 sprays into both nostrils daily.     furosemide 20 MG tablet  Commonly known as:  LASIX  Take 20 mg by mouth daily.     ibuprofen 800 MG tablet  Commonly known as:  ADVIL,MOTRIN  Take 800 mg by mouth every  8 (eight) hours as needed for mild pain.     loratadine 10 MG tablet  Commonly known as:  CLARITIN  Take 10 mg by mouth daily.     methocarbamol 750 MG tablet  Commonly known as:  ROBAXIN  Take 750 mg by mouth 2 (two) times daily as needed for muscle spasms.     omeprazole 20 MG capsule  Commonly known as:  PRILOSEC  Take 20 mg by mouth 2 (two) times daily before a meal.     oxyCODONE-acetaminophen 10-325 MG per tablet  Commonly known as:  PERCOCET  Take 1 tablet by mouth every 8 (eight) hours as needed for pain.     potassium chloride SA 20 MEQ tablet  Commonly known as:  K-DUR,KLOR-CON  Take 1 tablet (20 mEq total) by mouth daily.     risperiDONE 3 MG tablet  Commonly known as:  RISPERDAL  Take 1.5 mg by mouth at bedtime.     traMADol 50 MG tablet  Commonly known as:  ULTRAM  Take 50 mg by mouth 2 (two) times daily as needed for moderate pain.     traZODone 50 MG tablet  Commonly known as:   DESYREL  Take 50-100 mg by mouth at bedtime as needed for sleep.     UNABLE TO FIND  Inject 2 each as directed every other day. Med Name: "Compound allergy injection" prescribed by Dr. [name] at [facility] in [place].  Administers one injection in each arm every other day at home.           Follow-up Information   Follow up with Gayland Curry, MD. Schedule an appointment as soon as possible for a visit in 3 weeks. (For wound re-check)    Specialty:  General Surgery   Contact information:   [address] [place] [address] [phone]        The results of significant diagnostics from this hospitalization (including imaging, microbiology, ancillary and laboratory) are listed below for reference.    Significant Diagnostic Studies: Dg Cholangiogram Operative  07/05/2014   CLINICAL DATA:  Cholecystectomy for symptomatic cholelithiasis.  EXAM: INTRAOPERATIVE CHOLANGIOGRAM  TECHNIQUE: Cholangiographic images from the C-arm fluoroscopic device were submitted for interpretation post-operatively. Please see the procedural report for the amount of contrast and the fluoroscopy time utilized.  COMPARISON:  CT of the abdomen on 05/17/2014 and ultrasound on 02/21/2014.  FINDINGS: Intraoperative imaging with a C-arm demonstrates mildly prominent CBD caliber without evidence of filling defect or obstruction. Contrast enters the duodenum. No contrast extravasation is seen.  IMPRESSION: Mildly prominent common bile duct caliber. No evidence of ductal filling defects or obstruction.   Electronically Signed   By: Aletta Edouard M.D.   On: 07/05/2014 09:52    Microbiology: No results found for this or any previous visit (from the past 240 hour(s)).   Labs: Basic Metabolic Panel:  Recent Labs Lab 07/03/14 1528  NA 141  K 3.0*  CL 107  CO2 17*  GLUCOSE 137*  BUN 9  CREATININE 0.47*  CALCIUM 8.8   Liver Function Tests:  Recent Labs Lab 07/03/14 1528  AST 27   ALT 21  ALKPHOS 79  BILITOT 0.6  PROT 7.0  ALBUMIN 3.6   No results found for this basename: LIPASE, AMYLASE,  in the last 168 hours No results found for this basename: AMMONIA,  in the last 168 hours CBC:  Recent Labs Lab 07/03/14 1528  WBC 9.0  NEUTROABS 4.2  HGB  14.1  HCT 39.6  MCV 88.2  PLT 152   Cardiac Enzymes: No results found for this basename: CKTOTAL, CKMB, CKMBINDEX, TROPONINI,  in the last 168 hours BNP: BNP (last 3 results) No results found for this basename: PROBNP,  in the last 8760 hours CBG: No results found for this basename: GLUCAP,  in the last 168 hours  Active Problems:   Chronic cholecystitis   HTN (hypertension)   Depression   Back pain   Time coordinating discharge: 15 minutes  Signed:  Gayland Curry, MD [facility], [place] [phone] 07/06/2014, 10:12 AM

## 2014-07-19 ENCOUNTER — Telehealth (INDEPENDENT_AMBULATORY_CARE_PROVIDER_SITE_OTHER): Payer: Self-pay

## 2014-07-19 ENCOUNTER — Other Ambulatory Visit (INDEPENDENT_AMBULATORY_CARE_PROVIDER_SITE_OTHER): Payer: Self-pay

## 2014-07-19 MED ORDER — ONDANSETRON 4 MG PO TBDP: 4.0000 mg | ORAL_TABLET | Freq: Four times a day (QID) | ORAL | Status: DC | PRN

## 2014-07-19 NOTE — Telephone Encounter (Signed)
Pt s/p lap chole on 07/05/14 with Dr Redmond Pulling. Pt states that she has been having some n/v for 2 days. Pt states that she was unable to keep any foods down yesterday however today she is feeling better and has been able to keep foods down. Pt denies any fevers or chills. Pt states that she has had some back pain. Advised pt that she can take Ibuprofen up to 800mg  every 8 hours as needed for pain relief and she can place heat/ice on the area. Advised pt that we can also call Zofran 4 mg ODT 1 tab every 6 hours prn #15 per protocol. Advised pt to make sure that she is drinking plenty of fluids. Informed pt that if symptoms continue to give our office a call back. Pt verbalized understanding.

## 2014-08-02 ENCOUNTER — Encounter (INDEPENDENT_AMBULATORY_CARE_PROVIDER_SITE_OTHER): Payer: Medicaid Other | Admitting: General Surgery

## 2014-08-28 ENCOUNTER — Other Ambulatory Visit: Payer: Self-pay

## 2014-08-28 DIAGNOSIS — Z1231 Encounter for screening mammogram for malignant neoplasm of breast: Secondary | ICD-10-CM

## 2014-09-04 ENCOUNTER — Telehealth (INDEPENDENT_AMBULATORY_CARE_PROVIDER_SITE_OTHER): Payer: Self-pay

## 2014-09-04 NOTE — Telephone Encounter (Signed)
Pt called stating she is still having right lower quad abd discomfort that radiates to back. Pt states Dr Redmond Pulling advised her at last visit if pain continued he will ref pt out to another MD. Pt is asking for referral. Pt advised this msg will be sent to Dr Redmond Pulling. Pt can be reached at [phone]. Msg also in allscripts.

## 2014-09-07 NOTE — Telephone Encounter (Signed)
i thought i already addressed this in a previous telephone encounter - please refer her to a gastroenterologist - Right lower quadrant/side pain.

## 2014-09-12 ENCOUNTER — Ambulatory Visit: Payer: Medicaid Other

## 2014-10-12 ENCOUNTER — Encounter (INDEPENDENT_AMBULATORY_CARE_PROVIDER_SITE_OTHER): Payer: Self-pay

## 2014-10-12 ENCOUNTER — Ambulatory Visit
Admission: RE | Admit: 2014-10-12 | Discharge: 2014-10-12 | Disposition: A | Discharge status: Home / Self Care | Payer: Medicaid Other | Source: Ambulatory Visit

## 2014-10-12 DIAGNOSIS — Z1231 Encounter for screening mammogram for malignant neoplasm of breast: Secondary | ICD-10-CM

## 2014-12-11 ENCOUNTER — Other Ambulatory Visit: Payer: Self-pay | Admitting: Internal Medicine

## 2014-12-11 DIAGNOSIS — R911 Solitary pulmonary nodule: Secondary | ICD-10-CM

## 2014-12-13 ENCOUNTER — Other Ambulatory Visit: Payer: Medicaid Other

## 2014-12-13 ENCOUNTER — Ambulatory Visit
Admission: RE | Admit: 2014-12-13 | Discharge: 2014-12-13 | Disposition: A | Discharge status: Home / Self Care | Payer: Medicaid Other | Source: Ambulatory Visit | Attending: Internal Medicine | Admitting: Internal Medicine

## 2014-12-13 DIAGNOSIS — R911 Solitary pulmonary nodule: Secondary | ICD-10-CM

## 2014-12-13 MED ORDER — IOHEXOL 300 MG/ML  SOLN
75.0000 mL | Freq: Once | INTRAMUSCULAR | Status: AC | PRN
  Administered 2014-12-13: 75 mL via INTRAVENOUS

## 2015-09-11 ENCOUNTER — Emergency Department (HOSPITAL_COMMUNITY)
Admission: EM | Admit: 2015-09-11 | Discharge: 2015-09-11 | Disposition: A | Discharge status: Home / Self Care | Payer: Medicaid Other | Attending: Emergency Medicine | Admitting: Emergency Medicine

## 2015-09-11 ENCOUNTER — Encounter (HOSPITAL_COMMUNITY): Payer: Self-pay

## 2015-09-11 ENCOUNTER — Emergency Department (HOSPITAL_COMMUNITY): Payer: Medicaid Other

## 2015-09-11 DIAGNOSIS — Z8709 Personal history of other diseases of the respiratory system: Secondary | ICD-10-CM | POA: Diagnosis not present

## 2015-09-11 DIAGNOSIS — Z9049 Acquired absence of other specified parts of digestive tract: Secondary | ICD-10-CM | POA: Diagnosis not present

## 2015-09-11 DIAGNOSIS — Z8639 Personal history of other endocrine, nutritional and metabolic disease: Secondary | ICD-10-CM | POA: Diagnosis not present

## 2015-09-11 DIAGNOSIS — G629 Polyneuropathy, unspecified: Secondary | ICD-10-CM | POA: Diagnosis not present

## 2015-09-11 DIAGNOSIS — N939 Abnormal uterine and vaginal bleeding, unspecified: Secondary | ICD-10-CM | POA: Diagnosis not present

## 2015-09-11 DIAGNOSIS — M158 Other polyosteoarthritis: Secondary | ICD-10-CM | POA: Diagnosis not present

## 2015-09-11 DIAGNOSIS — Z791 Long term (current) use of non-steroidal anti-inflammatories (NSAID): Secondary | ICD-10-CM | POA: Insufficient documentation

## 2015-09-11 DIAGNOSIS — K219 Gastro-esophageal reflux disease without esophagitis: Secondary | ICD-10-CM | POA: Diagnosis not present

## 2015-09-11 DIAGNOSIS — Z90710 Acquired absence of both cervix and uterus: Secondary | ICD-10-CM | POA: Diagnosis not present

## 2015-09-11 DIAGNOSIS — Z8659 Personal history of other mental and behavioral disorders: Secondary | ICD-10-CM | POA: Insufficient documentation

## 2015-09-11 DIAGNOSIS — R1032 Left lower quadrant pain: Secondary | ICD-10-CM | POA: Diagnosis present

## 2015-09-11 DIAGNOSIS — R1084 Generalized abdominal pain: Secondary | ICD-10-CM | POA: Diagnosis not present

## 2015-09-11 DIAGNOSIS — Z7952 Long term (current) use of systemic steroids: Secondary | ICD-10-CM | POA: Diagnosis not present

## 2015-09-11 DIAGNOSIS — Z79899 Other long term (current) drug therapy: Secondary | ICD-10-CM | POA: Insufficient documentation

## 2015-09-11 DIAGNOSIS — G8929 Other chronic pain: Secondary | ICD-10-CM | POA: Diagnosis not present

## 2015-09-11 DIAGNOSIS — Z720 Tobacco use: Secondary | ICD-10-CM | POA: Diagnosis not present

## 2015-09-11 DIAGNOSIS — Z880 Allergy status to penicillin: Secondary | ICD-10-CM | POA: Insufficient documentation

## 2015-09-11 DIAGNOSIS — R109 Unspecified abdominal pain: Secondary | ICD-10-CM

## 2015-09-11 LAB — COMPREHENSIVE METABOLIC PANEL
ALT: 19 U/L (ref 14–54)
ANION GAP: 6 (ref 5–15)
AST: 25 U/L (ref 15–41)
Albumin: 4.4 g/dL (ref 3.5–5.0)
Alkaline Phosphatase: 90 U/L (ref 38–126)
BILIRUBIN TOTAL: 0.5 mg/dL (ref 0.3–1.2)
BUN: 17 mg/dL (ref 6–20)
CALCIUM: 8.7 mg/dL — AB (ref 8.9–10.3)
CO2: 21 mmol/L — ABNORMAL LOW (ref 22–32)
CREATININE: 0.52 mg/dL (ref 0.44–1.00)
Chloride: 111 mmol/L (ref 101–111)
GFR calc Af Amer: >60 mL/min (ref >60)
GFR calc non Af Amer: >60 mL/min (ref >60)
Glucose, Bld: 118 mg/dL — ABNORMAL HIGH (ref 65–99)
Potassium: 2.8 mmol/L — ABNORMAL LOW (ref 3.5–5.1)
Sodium: 138 mmol/L (ref 135–145)
TOTAL PROTEIN: 7.7 g/dL (ref 6.5–8.1)

## 2015-09-11 LAB — URINALYSIS, ROUTINE W REFLEX MICROSCOPIC
BILIRUBIN URINE: NEGATIVE
GLUCOSE, UA: NEGATIVE mg/dL
Hgb urine dipstick: NEGATIVE
Ketones, ur: NEGATIVE mg/dL
Leukocytes, UA: NEGATIVE
Nitrite: NEGATIVE
Protein, ur: NEGATIVE mg/dL
Specific Gravity, Urine: 1.016 (ref 1.005–1.030)
UROBILINOGEN UA: 0.2 mg/dL (ref 0.0–1.0)
pH: 6 (ref 5.0–8.0)

## 2015-09-11 LAB — CBC
HEMATOCRIT: 43.7 % (ref 36.0–46.0)
Hemoglobin: 15.5 g/dL — ABNORMAL HIGH (ref 12.0–15.0)
MCH: 30.8 pg (ref 26.0–34.0)
MCHC: 35.5 g/dL (ref 30.0–36.0)
MCV: 86.7 fL (ref 78.0–100.0)
Platelets: 183 10*3/uL (ref 150–400)
RBC: 5.04 MIL/uL (ref 3.87–5.11)
RDW: 13.6 % (ref 11.5–15.5)
WBC: 8.4 10*3/uL (ref 4.0–10.5)

## 2015-09-11 MED ORDER — POTASSIUM CHLORIDE CRYS ER 20 MEQ PO TBCR
40.0000 meq | EXTENDED_RELEASE_TABLET | Freq: Once | ORAL | Status: AC
  Administered 2015-09-11: 40 meq via ORAL
  Filled 2015-09-11: qty 2

## 2015-09-11 MED ORDER — IOHEXOL 300 MG/ML  SOLN
50.0000 mL | Freq: Once | INTRAMUSCULAR | Status: AC | PRN
  Administered 2015-09-11: 50 mL via ORAL

## 2015-09-11 MED ORDER — DIPHENHYDRAMINE HCL 25 MG PO TABS: 50.0000 mg | ORAL_TABLET | Freq: Every evening | ORAL | Status: AC | PRN

## 2015-09-11 MED ORDER — OXYCODONE-ACETAMINOPHEN 5-325 MG PO TABS
2.0000 | ORAL_TABLET | Freq: Once | ORAL | Status: AC
  Administered 2015-09-11: 2 via ORAL
  Filled 2015-09-11: qty 2

## 2015-09-11 MED ORDER — IOHEXOL 300 MG/ML  SOLN
100.0000 mL | Freq: Once | INTRAMUSCULAR | Status: AC | PRN
  Administered 2015-09-11: 100 mL via INTRAVENOUS

## 2015-09-11 NOTE — ED Notes (Addendum)
Pt c/o intermittent abdominal pain increasing w/ intake and intermittent vaginal bleeding x 3-4 weeks and intermittent R foot/toe pain/numbness x "a month or two."  Pain score 8/10.  Denies n/v/d.  Pt reports that she was recently prescribed either Neurontin or Voltaren, but has not started the medication.  Hx of chronic hip pain and arthritis.

## 2015-09-11 NOTE — ED Notes (Signed)
Oral contrast for CT completed at 12:38

## 2015-09-11 NOTE — ED Notes (Signed)
Pt refused prescription for benadryl, stating "I have two bottles at home". MD made aware. Prescription marked void and placed in shred box.

## 2015-09-11 NOTE — Discharge Instructions (Signed)

## 2015-09-11 NOTE — ED Provider Notes (Signed)
CSN: [id number]     Arrival date & time 09/11/15  1038 History   First MD Initiated Contact with Patient 09/11/15 1151     Chief Complaint  Patient presents with  . Abdominal Pain  . Vaginal Bleeding  . Toe Pain     (Consider location/radiation/quality/duration/timing/severity/associated sxs/prior Treatment) HPI Comments: 64 year old female here with abdominal pain, leg pain. She was supposed to get some Neurontin from her regular doctor for some burning type nerve pain in her legs but she hasn't gotten it filled. She reports abdominal pain for 2 months. No fever, vomiting, diarrhea. She states an occasional speck of blood in her urine. No vaginal bleeding.  Review of records show cholecystectomy last year. She's been seen for R sided lower abdominal pain previously, CT showed pulmonary nodule. F/u CT in January of this year showed concern for bronchogenic carcinoma. She hasn't followed up for this and she is unaware of any cancer diagnosis she might have.  Patient states she does not have much more for pain medicine at home. She is having difficulty sleeping due to her burning pain in her feet.  Patient is a 64 y.o. female presenting with abdominal pain, vaginal bleeding, and toe pain. The history is provided by the patient.  Abdominal Pain Pain location:  LLQ and RLQ Pain quality: aching and sharp   Pain radiates to:  Does not radiate Pain severity:  Moderate Onset quality:  Gradual Duration:  8 weeks Timing:  Constant Chronicity:  Chronic Relieved by:  Nothing Worsened by:  Nothing tried Associated symptoms: vaginal bleeding   Associated symptoms: no chills and no fever   Vaginal Bleeding Associated symptoms: abdominal pain   Associated symptoms: no fever   Toe Pain Associated symptoms include abdominal pain.    Past Medical History  Diagnosis Date  . Hypertension   . Depression   . Anxiety   . GERD (gastroesophageal reflux disease)   . Cholelithiasis 07/2014  . High  cholesterol   . Chronic bronchitis ([place])     "get it q yr"  . Arthritis     "thighs; legs; hips" (07/05/2014)  . Chronic lower back pain   . Bipolar disorder ([place])   . Schizophrenia [facility])    Past Surgical History  Procedure Laterality Date  . Ankle fracture surgery Right   . Laparoscopic cholecystectomy  07/05/2014  . Appendectomy  ~ 1963  . Excisional hemorrhoidectomy    . Ankle hardware removal Right   . Breast biopsy Bilateral   . Breast cyst excision Bilateral     "not cancer"  . Abdominal hysterectomy    . Pilonidal cyst excision    . Cataract extraction w/ intraocular lens  implant, bilateral    . Multiple tooth extractions  05/2013    "pulled my upper teeth"  . Cholecystectomy N/A 07/05/2014    Procedure: LAPAROSCOPIC CHOLECYSTECTOMY WITH INTRAOPERATIVE CHOLANGIOGRAM;  Surgeon: Gayland Curry, MD;  Location: [place];  Service: General;  Laterality: N/A;   History reviewed. No pertinent family history. Social History  Substance Use Topics  . Smoking status: Current Every Day Smoker -- 1.50 packs/day for 48 years    Types: Cigarettes  . Smokeless tobacco: Never Used  . Alcohol Use: 4.2 oz/week    7 Cans of beer per week     Comment: 07/05/2014 "couple 40oz beers/wk"   OB History    No data available     Review of Systems  Constitutional: Negative for fever and chills.  Gastrointestinal: Positive for  abdominal pain.  Genitourinary: Positive for vaginal bleeding.  All other systems reviewed and are negative.     Allergies  Penicillins  Home Medications   Prior to Admission medications   Medication Sig Start Date End Date Taking? Authorizing Provider  amLODipine-benazepril (LOTREL) 5-10 MG per capsule Take 1 capsule by mouth daily.   Yes Historical Provider, MD  diclofenac (VOLTAREN) 75 MG EC tablet Take 75 mg by mouth 2 (two) times daily.   Yes Historical Provider, MD  EPINEPHrine (EPIPEN IJ) Inject 1 each as directed once as needed (allergic reaction).    Yes  Historical Provider, MD  estrogens, conjugated, (PREMARIN) 0.625 MG tablet Take 0.625 mg by mouth daily. Take daily for 21 days then do not take for 7 days.   Yes Historical Provider, MD  fluticasone (FLONASE) 50 MCG/ACT nasal spray Place 2 sprays into both nostrils daily as needed for allergies.  09/10/15  Yes Historical Provider, MD  furosemide (LASIX) 20 MG tablet Take 20 mg by mouth daily.   Yes Historical Provider, MD  Linaclotide (LINZESS) 290 MCG CAPS capsule Take 290 mcg by mouth daily.   Yes Historical Provider, MD  loratadine (CLARITIN) 10 MG tablet Take 10 mg by mouth daily.   Yes Historical Provider, MD  methocarbamol (ROBAXIN) 750 MG tablet Take 750 mg by mouth 2 (two) times daily as needed for muscle spasms.   Yes Historical Provider, MD  mometasone (ELOCON) 0.1 % ointment Apply 1 application topically daily.   Yes Historical Provider, MD  omeprazole (PRILOSEC) 20 MG capsule Take 20 mg by mouth 2 (two) times daily before a meal.   Yes Historical Provider, MD  potassium chloride SA (K-DUR,KLOR-CON) 20 MEQ tablet Take 1 tablet (20 mEq total) by mouth daily. Patient not taking: Reported on 09/11/2015 02/26/14   Orlie Dakin, MD   BP 141/74 mmHg  Pulse 87  Temp(Src) 98.1 F (36.7 C) (Oral)  Resp 16  SpO2 100% Physical Exam  Constitutional: She is oriented to person, place, and time. She appears well-developed and well-nourished. No distress.  HENT:  Head: Normocephalic and atraumatic.  Mouth/Throat: Oropharynx is clear and moist.  Eyes: EOM are normal. Pupils are equal, round, and reactive to light.  Neck: Normal range of motion. Neck supple.  Cardiovascular: Normal rate and regular rhythm.  Exam reveals no friction rub.   No murmur heard. Pulmonary/Chest: Effort normal and breath sounds normal. No respiratory distress. She has no wheezes. She has no rales.  Abdominal: Soft. She exhibits no distension. There is tenderness (diffuse lower). There is no rebound.  Musculoskeletal:  Normal range of motion. She exhibits no edema.  Neurological: She is alert and oriented to person, place, and time.  Skin: She is not diaphoretic.  Nursing note and vitals reviewed.   ED Course  Procedures (including critical care time) Labs Review Labs Reviewed  CBC  URINALYSIS, ROUTINE W REFLEX MICROSCOPIC (NOT AT [facility])  COMPREHENSIVE METABOLIC PANEL    Imaging Review Ct Abdomen Pelvis W Contrast  09/11/2015   CLINICAL DATA:  Intermittent abdominal pain and vaginal bleeding for 3-4 weeks  EXAM: CT ABDOMEN AND PELVIS WITH CONTRAST  TECHNIQUE: Multidetector CT imaging of the abdomen and pelvis was performed using the standard protocol following bolus administration of intravenous contrast.  CONTRAST:  151m OMNIPAQUE IOHEXOL 300 MG/ML SOLN, 551mOMNIPAQUE IOHEXOL 300 MG/ML SOLN  COMPARISON:  None.  FINDINGS: The lung bases demonstrate some mild emphysematous changes. No focal infiltrate is seen. The liver is mildly decreased in  attenuation consistent with fatty infiltration. A few small scattered cysts are noted. The largest of these lies within the left lobe measuring approximately 1.5 cm in greatest dimension. The gallbladder has been surgically removed. Fullness of the common bile duct is noted secondary to the post cholecystectomy state.  The spleen, adrenal glands, pancreas and kidneys are within normal limits with the exception of scattered small renal cysts. No obstructive changes or renal calculi are noted.  The appendix is been surgically removed. The uterus has also been surgically removed. The bladder is well distended. A left ovarian cyst is seen measuring 2.2 cm in greatest dimension. Increased density is noted in the vaginal vault consistent with the patient's given clinical history of vaginal bleeding. No definitive mass lesion is noted however. No pelvic lymphadenopathy is seen. No free fluid is noted.  IMPRESSION: Increased density within the vaginal vault consistent with the  patient's given clinical history of vaginal bleeding.  Hepatic and renal cysts.  Left ovarian cyst  Postsurgical changes.  No acute abnormality is noted.   Electronically Signed   By: Inez Catalina M.D.   On: 09/11/2015 14:04   I have personally reviewed and evaluated these images and lab results as part of my medical decision-making.   EKG Interpretation None      MDM   Final diagnoses:  Chronic abdominal pain  Peripheral polyneuropathy ([place])    64 year old female here with abdominal pain and lower leg pain. These all appear chronic per the notes and per the patient. Abdominal pains been on for 2 months but I have records from last year discussing her abdominal pain. She is also complaining of bilateral foot burning sensation. She was supposedly prescribed some nerve pills by her regular doctor but has not gotten them filled. She does see a chronic pain specialist.  She has diffuse lower abdominal pain on exam. She denied any vaginal bleeding but had reported some blood in her urine. Urine here is negative. Labs show mild hypokalemia at 2.8, orally replaced. She has a history of hypokalemia. CT scan is normal. He does show some possible blood in her vaginal vault. On pelvic exam her blood vaginal exam is normal. I feel like a lot of the pain today is secondary to patient's chronic pain. She is asking for Percocet to go home, I explained I could not get this to her since she has been seen in the pain clinic. I instructed to follow-up with her PCP. She stable for discharge.  Evelina Bucy, MD 09/11/15 1525

## 2015-09-14 ENCOUNTER — Other Ambulatory Visit: Payer: Self-pay

## 2015-10-06 ENCOUNTER — Other Ambulatory Visit: Payer: Self-pay

## 2015-10-06 DIAGNOSIS — Z1231 Encounter for screening mammogram for malignant neoplasm of breast: Secondary | ICD-10-CM

## 2015-10-23 ENCOUNTER — Ambulatory Visit: Payer: Medicaid Other

## 2015-11-13 ENCOUNTER — Ambulatory Visit: Payer: Medicaid Other

## 2015-12-23 ENCOUNTER — Emergency Department (HOSPITAL_COMMUNITY)
Admission: EM | Admit: 2015-12-23 | Discharge: 2015-12-23 | Disposition: A | Discharge status: Home / Self Care | Payer: Medicaid Other | Attending: Emergency Medicine | Admitting: Emergency Medicine

## 2015-12-23 ENCOUNTER — Encounter (HOSPITAL_COMMUNITY): Payer: Self-pay | Admitting: Emergency Medicine

## 2015-12-23 DIAGNOSIS — Z8659 Personal history of other mental and behavioral disorders: Secondary | ICD-10-CM | POA: Diagnosis not present

## 2015-12-23 DIAGNOSIS — Z79899 Other long term (current) drug therapy: Secondary | ICD-10-CM | POA: Insufficient documentation

## 2015-12-23 DIAGNOSIS — S0591XA Unspecified injury of right eye and orbit, initial encounter: Secondary | ICD-10-CM | POA: Diagnosis present

## 2015-12-23 DIAGNOSIS — Z7951 Long term (current) use of inhaled steroids: Secondary | ICD-10-CM | POA: Diagnosis not present

## 2015-12-23 DIAGNOSIS — Z8639 Personal history of other endocrine, nutritional and metabolic disease: Secondary | ICD-10-CM | POA: Insufficient documentation

## 2015-12-23 DIAGNOSIS — T1591XA Foreign body on external eye, part unspecified, right eye, initial encounter: Secondary | ICD-10-CM | POA: Diagnosis not present

## 2015-12-23 DIAGNOSIS — Y9389 Activity, other specified: Secondary | ICD-10-CM | POA: Insufficient documentation

## 2015-12-23 DIAGNOSIS — G8929 Other chronic pain: Secondary | ICD-10-CM | POA: Insufficient documentation

## 2015-12-23 DIAGNOSIS — Y998 Other external cause status: Secondary | ICD-10-CM | POA: Insufficient documentation

## 2015-12-23 DIAGNOSIS — Z880 Allergy status to penicillin: Secondary | ICD-10-CM | POA: Diagnosis not present

## 2015-12-23 DIAGNOSIS — I10 Essential (primary) hypertension: Secondary | ICD-10-CM | POA: Diagnosis not present

## 2015-12-23 DIAGNOSIS — Y9289 Other specified places as the place of occurrence of the external cause: Secondary | ICD-10-CM | POA: Diagnosis not present

## 2015-12-23 DIAGNOSIS — W208XXA Other cause of strike by thrown, projected or falling object, initial encounter: Secondary | ICD-10-CM | POA: Diagnosis not present

## 2015-12-23 DIAGNOSIS — M158 Other polyosteoarthritis: Secondary | ICD-10-CM | POA: Insufficient documentation

## 2015-12-23 DIAGNOSIS — Z8709 Personal history of other diseases of the respiratory system: Secondary | ICD-10-CM | POA: Diagnosis not present

## 2015-12-23 DIAGNOSIS — Z7952 Long term (current) use of systemic steroids: Secondary | ICD-10-CM | POA: Insufficient documentation

## 2015-12-23 DIAGNOSIS — K219 Gastro-esophageal reflux disease without esophagitis: Secondary | ICD-10-CM | POA: Insufficient documentation

## 2015-12-23 DIAGNOSIS — F17210 Nicotine dependence, cigarettes, uncomplicated: Secondary | ICD-10-CM | POA: Diagnosis not present

## 2015-12-23 DIAGNOSIS — T1590XA Foreign body on external eye, part unspecified, unspecified eye, initial encounter: Secondary | ICD-10-CM

## 2015-12-23 DIAGNOSIS — T1592XA Foreign body on external eye, part unspecified, left eye, initial encounter: Secondary | ICD-10-CM | POA: Insufficient documentation

## 2015-12-23 DIAGNOSIS — Z791 Long term (current) use of non-steroidal anti-inflammatories (NSAID): Secondary | ICD-10-CM | POA: Insufficient documentation

## 2015-12-23 MED ORDER — FLUORESCEIN SODIUM 1 MG OP STRP
1.0000 | ORAL_STRIP | Freq: Once | OPHTHALMIC | Status: AC
  Administered 2015-12-23: 1 via OPHTHALMIC
  Filled 2015-12-23: qty 1

## 2015-12-23 MED ORDER — TETRACAINE HCL 0.5 % OP SOLN
2.0000 [drp] | Freq: Once | OPHTHALMIC | Status: AC
  Administered 2015-12-23: 2 [drp] via OPHTHALMIC
  Filled 2015-12-23: qty 8

## 2015-12-23 MED ORDER — SODIUM CHLORIDE 0.9 % IR SOLN
100.0000 mL | Freq: Once | Status: AC
Start: 2015-12-23 — End: 2015-12-23
  Administered 2015-12-23: 100 mL

## 2015-12-23 MED ORDER — ERYTHROMYCIN 5 MG/GM OP OINT: TOPICAL_OINTMENT | OPHTHALMIC | Status: DC

## 2015-12-23 MED ORDER — ACETAMINOPHEN 325 MG PO TABS
650.0000 mg | ORAL_TABLET | Freq: Once | ORAL | Status: AC
  Administered 2015-12-23: 650 mg via ORAL
  Filled 2015-12-23: qty 2

## 2015-12-23 NOTE — ED Notes (Addendum)
Brought in by ems pt had popcorn ceiling fall from ceiling into her eyes lives in an apt and unsure of how the ceiling fell in. Pt flushed eyes at home refused saline flush by ems and ed staff. C/o burning to eyes but still able to see staff. Pt does have a wash cloth over eyes.

## 2015-12-23 NOTE — Discharge Instructions (Signed)
You were seen in the emergency room today for evaluation after the ceiling of your apartment caved in and the paint/popcorn chips fell into your eyes. We rinsed your eyes copiously with saline. Your eye exam was normal. I did not see any remaining foreign bodies and there was no evidence of corneal abrasion. Please call Dr. Kellie Moor office tomorrow to schedule a follow up appointment within 24-48 hours. In the meantime I gave you a prescription for antibiotic eye ointment. This will help soothe the irritation and prevent infection. Return to the ER for new or worsening symptoms.   Eye Foreign Body A foreign body refers to any object on the surface of the eye or in the eyeball that should not be there. A foreign body may be a small speck of dirt or dust, a hair or eyelash, a splinter, or any other object.  SIGNS AND SYMPTOMS Symptoms depend on what the foreign body is and where it is in the eye. The most common locations are:   On the inner surface of the upper or lower eyelids or on the covering of the white part of the eye (conjunctiva). Symptoms in this location are:  Pain and irritation, especially when blinking.  The feeling that something is in the eye.  On the surface of the clear covering on the front of the eye (cornea). Symptoms in this location include:  Pain and irritation.   Small "rust rings" around a metallic foreign body.  The feeling that something is in the eye.   Inside the eyeball. Foreign bodies inside the eye may cause:   Great pain.   Immediate loss of vision.   Distortion of the pupil. DIAGNOSIS  Foreign bodies are found during an exam by an eye specialist. Those on the eyelids, conjunctiva, or cornea are usually (but not always) easily found. When a foreign body is inside the eyeball, a cloudiness of the lens (cataract) may form almost right away. This makes it hard for an eye specialist to find the foreign body. Tests may be needed, including ultrasound  testing, X-rays, and CT scans. TREATMENT   Foreign bodies on the eyelids, conjunctiva, or cornea are often removed easily and painlessly.  Rust in the cornea may require the use of a drill-like instrument to remove the rust.  If the foreign body has caused a scratch or a rubbing or scraping (abrasion) of the cornea, this may be treated with antibiotic drops or ointment. A pressure patch may be put over your eye.  If the foreign body is inside your eyeball, surgery is needed right away. This is a medical emergency. Foreign bodies inside the eye threaten vision. A person may even lose his or her eye. HOME CARE INSTRUCTIONS   Take medicines only as directed by your health care provider. Use eye drops or ointment as directed.  If no eye patch was applied:  Keep your eye closed as much as possible.  Do not rub your eye.  Wear dark glasses as needed to protect your eyes from bright light.  Do not wear contact lenses until your eye feels normal again, or as instructed by your health care provider.  Wear a protective eye covering if there is a risk of eye injury. This is important when working with high-speed tools.  If your eye is patched:  Follow your health care provider's instructions for when to remove the patch.  Do notdrive or operate machinery if your eye is patched. Your ability to judge distances is impaired.  Keep all follow-up visits as directed by your health care provider. This is important. SEEK MEDICAL CARE IF:   You have increased pain in your eye.  Your vision gets worse.   You have problems with your eye patch.   You have fluid (discharge) coming from your injured eye.   You have redness and swelling around your affected eye.  MAKE SURE YOU:   Understand these instructions.  Will watch your condition.  Will get help right away if you are not doing well or get worse.   This information is not intended to replace advice given to you by your health  care provider. Make sure you discuss any questions you have with your health care provider.   Document Released: 11/17/2005 Document Revised: 12/08/2014 Document Reviewed: 04/14/2013 Elsevier Interactive Patient Education [employer].

## 2015-12-23 NOTE — ED Provider Notes (Signed)
CSN: [id number]     Arrival date & time 12/23/15  1610 History  By signing my name below, I, Randa Evens, attest that this documentation has been prepared under the direction and in the presence of Trino Higinbotham Y Shaylinn Hladik, [place]. Electronically Signed: Randa Evens, ED Scribe. 12/23/2015. 6:33 PM.      Chief Complaint  Patient presents with  . Eye Injury   The history is provided by the patient. No language interpreter was used.   HPI Comments: Faith [name] is a 65 y.o. female brought in by ambulance, who presents to the Emergency Department complaining of new eye injury onset today PTA. Pt states that the popcorn ceiling from her ceiling fell into both of her eyes. Pt states that she tried flushing her eyes at home with no relief. She describes the feeling as her eyes being itchy and irritated. States she feels like there is something in both of her eyes. Pt states that she does wear glasses. Pt doesn't report loss of vision.  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   Past Medical History  Diagnosis Date  . Hypertension   . Depression   . Anxiety   . GERD (gastroesophageal reflux disease)   . Cholelithiasis 07/2014  . High cholesterol   . Chronic bronchitis ([place])     "get it q yr"  . Arthritis     "thighs; legs; hips" (07/05/2014)  . Chronic lower back pain   . Bipolar disorder ([place])   . Schizophrenia [facility])    Past Surgical History  Procedure Laterality Date  . Ankle fracture surgery Right   . Laparoscopic cholecystectomy  07/05/2014  . Appendectomy  ~ 1963  . Excisional hemorrhoidectomy    . Ankle hardware removal Right   . Breast biopsy Bilateral   . Breast cyst excision Bilateral     "not cancer"  . Abdominal hysterectomy    . Pilonidal cyst excision    . Cataract extraction w/ intraocular lens  implant, bilateral    . Multiple tooth extractions  05/2013    "pulled my upper teeth"  . Cholecystectomy N/A 07/05/2014    Procedure: LAPAROSCOPIC CHOLECYSTECTOMY WITH INTRAOPERATIVE  CHOLANGIOGRAM;  Surgeon: Gayland Curry, MD;  Location: [place];  Service: General;  Laterality: N/A;   No family history on file. Social History  Substance Use Topics  . Smoking status: Current Every Day Smoker -- 1.50 packs/day for 48 years    Types: Cigarettes  . Smokeless tobacco: Never Used  . Alcohol Use: 4.2 oz/week    7 Cans of beer per week     Comment: 07/05/2014 "couple 40oz beers/wk"   OB History    No data available     Review of Systems  Eyes: Positive for pain and itching. Negative for redness and visual disturbance.  All other systems reviewed and are negative.    Allergies  Penicillins  Home Medications   Prior to Admission medications   Medication Sig Start Date End Date Taking? Authorizing Provider  amLODipine-benazepril (LOTREL) 5-10 MG per capsule Take 1 capsule by mouth daily.    Historical Provider, MD  diclofenac (VOLTAREN) 75 MG EC tablet Take 75 mg by mouth 2 (two) times daily.    Historical Provider, MD  diphenhydrAMINE (BENADRYL) 25 MG tablet Take 2 tablets (50 mg total) by mouth at bedtime as needed for sleep. 09/11/15   Evelina Bucy, MD  EPINEPHrine (EPIPEN IJ) Inject 1 each as directed once as needed (allergic reaction).     Historical Provider, MD  estrogens, conjugated, (PREMARIN) 0.625 MG tablet Take 0.625 mg by mouth daily. Take daily for 21 days then do not take for 7 days.    Historical Provider, MD  fluticasone (FLONASE) 50 MCG/ACT nasal spray Place 2 sprays into both nostrils daily as needed for allergies.  09/10/15   Historical Provider, MD  furosemide (LASIX) 20 MG tablet Take 20 mg by mouth daily.    Historical Provider, MD  Linaclotide Rolan Lipa) 290 MCG CAPS capsule Take 290 mcg by mouth daily.    Historical Provider, MD  loratadine (CLARITIN) 10 MG tablet Take 10 mg by mouth daily.    Historical Provider, MD  methocarbamol (ROBAXIN) 750 MG tablet Take 750 mg by mouth 2 (two) times daily as needed for muscle spasms.    Historical Provider,  MD  mometasone (ELOCON) 0.1 % ointment Apply 1 application topically daily.    Historical Provider, MD  omeprazole (PRILOSEC) 20 MG capsule Take 20 mg by mouth 2 (two) times daily before a meal.    Historical Provider, MD  potassium chloride SA (K-DUR,KLOR-CON) 20 MEQ tablet Take 1 tablet (20 mEq total) by mouth daily. Patient not taking: Reported on 09/11/2015 02/26/14   Orlie Dakin, MD   BP 132/68 mmHg  Pulse 96  Resp 20  SpO2 99%   Physical Exam  Constitutional: She is oriented to person, place, and time. She appears well-developed and well-nourished. No distress.  HENT:  Head: Normocephalic and atraumatic.  Eyes: EOM are normal. Pupils are equal, round, and reactive to light.  Strabismus of left eye is baseline. Bilateral conjunctival injection. Couple small white specks visualized on left conjunctival surface. Gone with irrigation. No abrasion or ulceration with fluorescein stain. No conjunctival hemorrhage, hypopyon, hyphema. Mild chemosis bilaterally. PERRL.   Neck: Neck supple. No tracheal deviation present.  Cardiovascular: Normal rate.   Pulmonary/Chest: Effort normal. No respiratory distress.  Musculoskeletal: Normal range of motion.  Neurological: She is alert and oriented to person, place, and time.  Skin: Skin is warm and dry.  Psychiatric: She has a normal mood and affect. Her behavior is normal.  Nursing note and vitals reviewed.   ED Course  Procedures (including critical care time) DIAGNOSTIC STUDIES: Oxygen Saturation is 99% on RA, normal by my interpretation.    COORDINATION OF CARE: 6:32 PM-Discussed treatment plan with pt at bedside and pt agreed to plan.     Labs Review Labs Reviewed - No data to display  Imaging Review No results found.    EKG Interpretation None      MDM   Final diagnoses:  Eye foreign body, unspecified laterality, initial encounter   Irrigated eyes bilaterally with copious normal saline. No further foreign bodies  visualized on careful examination. No abrasions or uptake on fluorescein exam. Pt reports mild continued irritation from irrigation and examination but states she no longer feels like there is anything in her eyes. Visual acuity at baseline. Pt states she normally sees Dr. Gershon Crane for optho. Instructed to f/u with Dr. Gershon Crane within 24-48h. Rx for erythromycin ointment given. ER return precautions given.   I personally performed the services described in this documentation, which was scribed in my presence. The recorded information has been reviewed and is accurate.   Anne Ng, PA-C 12/23/15 2327  Noemi Chapel, MD 12/24/15 [phone]

## 2016-05-01 ENCOUNTER — Ambulatory Visit: Payer: Medicaid Other | Admitting: Gastroenterology

## 2016-05-26 ENCOUNTER — Encounter: Payer: Self-pay | Admitting: Gastroenterology

## 2016-07-16 ENCOUNTER — Encounter (HOSPITAL_COMMUNITY): Payer: Self-pay | Admitting: Emergency Medicine

## 2016-07-16 ENCOUNTER — Emergency Department (HOSPITAL_COMMUNITY)
Admission: EM | Admit: 2016-07-16 | Discharge: 2016-07-16 | Disposition: A | Discharge status: Home / Self Care | Payer: Medicaid Other | Attending: Emergency Medicine | Admitting: Emergency Medicine

## 2016-07-16 ENCOUNTER — Emergency Department (HOSPITAL_COMMUNITY): Payer: Medicaid Other

## 2016-07-16 DIAGNOSIS — Y939 Activity, unspecified: Secondary | ICD-10-CM | POA: Insufficient documentation

## 2016-07-16 DIAGNOSIS — Z23 Encounter for immunization: Secondary | ICD-10-CM | POA: Diagnosis not present

## 2016-07-16 DIAGNOSIS — Y999 Unspecified external cause status: Secondary | ICD-10-CM | POA: Insufficient documentation

## 2016-07-16 DIAGNOSIS — S96921A Laceration of unspecified muscle and tendon at ankle and foot level, right foot, initial encounter: Secondary | ICD-10-CM | POA: Insufficient documentation

## 2016-07-16 DIAGNOSIS — I10 Essential (primary) hypertension: Secondary | ICD-10-CM | POA: Insufficient documentation

## 2016-07-16 DIAGNOSIS — Z791 Long term (current) use of non-steroidal anti-inflammatories (NSAID): Secondary | ICD-10-CM | POA: Insufficient documentation

## 2016-07-16 DIAGNOSIS — IMO0002 Reserved for concepts with insufficient information to code with codable children: Secondary | ICD-10-CM

## 2016-07-16 DIAGNOSIS — W208XXA Other cause of strike by thrown, projected or falling object, initial encounter: Secondary | ICD-10-CM | POA: Diagnosis not present

## 2016-07-16 DIAGNOSIS — Y929 Unspecified place or not applicable: Secondary | ICD-10-CM | POA: Insufficient documentation

## 2016-07-16 DIAGNOSIS — S9031XA Contusion of right foot, initial encounter: Secondary | ICD-10-CM

## 2016-07-16 DIAGNOSIS — Z79899 Other long term (current) drug therapy: Secondary | ICD-10-CM | POA: Insufficient documentation

## 2016-07-16 DIAGNOSIS — S91011A Laceration without foreign body, right ankle, initial encounter: Secondary | ICD-10-CM

## 2016-07-16 DIAGNOSIS — S99911A Unspecified injury of right ankle, initial encounter: Secondary | ICD-10-CM | POA: Diagnosis present

## 2016-07-16 DIAGNOSIS — F17210 Nicotine dependence, cigarettes, uncomplicated: Secondary | ICD-10-CM | POA: Insufficient documentation

## 2016-07-16 MED ORDER — TETANUS-DIPHTH-ACELL PERTUSSIS 5-2.5-18.5 LF-MCG/0.5 IM SUSP
0.5000 mL | Freq: Once | INTRAMUSCULAR | Status: AC
  Administered 2016-07-16: 0.5 mL via INTRAMUSCULAR
  Filled 2016-07-16: qty 0.5

## 2016-07-16 MED ORDER — TETANUS-DIPHTHERIA TOXOIDS TD 5-2 LFU IM INJ: 0.5000 mL | INJECTION | Freq: Once | INTRAMUSCULAR | Status: DC

## 2016-07-16 MED ORDER — HYDROCODONE-ACETAMINOPHEN 5-325 MG PO TABS
1.0000 | ORAL_TABLET | Freq: Once | ORAL | Status: AC
  Administered 2016-07-16: 1 via ORAL
  Filled 2016-07-16: qty 1

## 2016-07-16 NOTE — ED Provider Notes (Signed)
[facility] DEPT Provider Note   CSN: [id number] Arrival date & time: 07/16/16  1638  By signing my name below, I, Rayna Sexton, attest that this documentation has been prepared under the direction and in the presence of Delsa Grana, PA-C. Electronically Signed: Rayna Sexton, ED Scribe. 07/16/16. 6:09 PM.   History   Chief Complaint Chief Complaint  Patient presents with  . Foot Injury    HPI HPI Comments: Faith [name] is a 64 y.o. female who presents to the Emergency Department complaining of a moderate laceration with controlled bleeding to her right dorsal foot which occurred 1 hour PTA. Pt states that she dropped a glass bottle on her right foot which shattered and a glass shard caused her laceration. She reports associated, moderate, pain across the wound that worsens with palpation and ambulation. Pt ambulates with a cane at baseline due to chronic arthritis.  She is ambulatory, denies numbness, weakness, color change. She is not on blood thinning medications and denies a PMHx of liver issues. Pt denies her tetanus vaccination is UTD. She denies any other associated symptoms at this time.   The history is provided by the patient. No language interpreter was used.    Past Medical History:  Diagnosis Date  . Anxiety   . Arthritis    "thighs; legs; hips" (07/05/2014)  . Bipolar disorder ([place])   . Cholelithiasis 07/2014  . Chronic bronchitis ([facility])    "get it q yr"  . Chronic lower back pain   . Depression   . GERD (gastroesophageal reflux disease)   . High cholesterol   . Hypertension   . Schizophrenia [facility])     Patient Active Problem List   Diagnosis Date Noted  . HTN (hypertension) 07/06/2014  . Depression 07/06/2014  . Back pain 07/06/2014  . Chronic cholecystitis 07/05/2014  . Right sided abdominal pain 05/11/2014  . Cholelithiasis 05/11/2014    Past Surgical History:  Procedure Laterality Date  . ABDOMINAL HYSTERECTOMY    . ANKLE FRACTURE SURGERY Right    . ANKLE HARDWARE REMOVAL Right   . APPENDECTOMY  ~ 1963  . BREAST BIOPSY Bilateral   . BREAST CYST EXCISION Bilateral    "not cancer"  . CATARACT EXTRACTION W/ INTRAOCULAR LENS  IMPLANT, BILATERAL    . CHOLECYSTECTOMY N/A 07/05/2014   Procedure: LAPAROSCOPIC CHOLECYSTECTOMY WITH INTRAOPERATIVE CHOLANGIOGRAM;  Surgeon: Gayland Curry, MD;  Location: [place];  Service: General;  Laterality: N/A;  . EXCISIONAL HEMORRHOIDECTOMY    . LAPAROSCOPIC CHOLECYSTECTOMY  07/05/2014  . MULTIPLE TOOTH EXTRACTIONS  05/2013   "pulled my upper teeth"  . PILONIDAL CYST EXCISION      OB History    No data available       Home Medications    Prior to Admission medications   Medication Sig Start Date End Date Taking? Authorizing Provider  amLODipine-benazepril (LOTREL) 5-10 MG per capsule Take 1 capsule by mouth daily.    Historical Provider, MD  diclofenac (VOLTAREN) 75 MG EC tablet Take 75 mg by mouth 2 (two) times daily.    Historical Provider, MD  diphenhydrAMINE (BENADRYL) 25 MG tablet Take 2 tablets (50 mg total) by mouth at bedtime as needed for sleep. 09/11/15   Evelina Bucy, MD  EPINEPHrine (EPIPEN IJ) Inject 1 each as directed once as needed (allergic reaction).     Historical Provider, MD  erythromycin ophthalmic ointment Place a 1/2 inch ribbon of ointment into the lower eyelid of both eyes three times daily. 12/23/15   Roanna Epley  Y Sam, PA-C  estrogens, conjugated, (PREMARIN) 0.625 MG tablet Take 0.625 mg by mouth daily. Take daily for 21 days then do not take for 7 days.    Historical Provider, MD  fluticasone (FLONASE) 50 MCG/ACT nasal spray Place 2 sprays into both nostrils daily as needed for allergies.  09/10/15   Historical Provider, MD  furosemide (LASIX) 20 MG tablet Take 20 mg by mouth daily.    Historical Provider, MD  Linaclotide Rolan Lipa) 290 MCG CAPS capsule Take 290 mcg by mouth daily.    Historical Provider, MD  loratadine (CLARITIN) 10 MG tablet Take 10 mg by mouth daily.    Historical  Provider, MD  methocarbamol (ROBAXIN) 750 MG tablet Take 750 mg by mouth 2 (two) times daily as needed for muscle spasms.    Historical Provider, MD  mometasone (ELOCON) 0.1 % ointment Apply 1 application topically daily.    Historical Provider, MD  omeprazole (PRILOSEC) 20 MG capsule Take 20 mg by mouth 2 (two) times daily before a meal.    Historical Provider, MD  potassium chloride SA (K-DUR,KLOR-CON) 20 MEQ tablet Take 1 tablet (20 mEq total) by mouth daily. Patient not taking: Reported on 09/11/2015 02/26/14   Orlie Dakin, MD    Family History No family history on file.  Social History Social History  Substance Use Topics  . Smoking status: Current Every Day Smoker    Packs/day: 1.50    Years: 48.00    Types: Cigarettes  . Smokeless tobacco: Never Used  . Alcohol use 4.2 oz/week    7 Cans of beer per week     Comment: 07/05/2014 "couple 40oz beers/wk"     Allergies   Penicillins   Review of Systems Review of Systems  Musculoskeletal: Negative for arthralgias.  Skin: Positive for wound. Negative for color change.  Neurological: Negative for numbness.  All other systems reviewed and are negative.  Physical Exam Updated Vital Signs There were no vitals taken for this visit.  Physical Exam  Constitutional: She is oriented to person, place, and time. She appears well-developed and well-nourished.  HENT:  Head: Normocephalic and atraumatic.  Eyes: EOM are normal.  Neck: Normal range of motion.  Cardiovascular: Normal rate.   Pulmonary/Chest: Effort normal. No respiratory distress.  Abdominal: Soft.  Musculoskeletal: She exhibits tenderness.       Right ankle: She exhibits decreased range of motion and laceration. She exhibits no swelling, no deformity and normal pulse. No tenderness. Achilles tendon normal.  Laceration over right anterior ankle, 2.5 cm long gaping open nearly 1 cm, active bleeding, slow oozing of dark blood, easily stopped by occluding superficial  veins on dorsum of foot. Gaping area of lac has deep stab centrally, visible superficial tendon with complete laceration, decreased strength (4/5) with dorsiflexion against resistance when compared to 5/5 strength on left. Normal sensation to light touch in all digits on right foot, normal capillary refill, 2+ PD pulse.  Neurological: She is alert and oriented to person, place, and time. She exhibits normal muscle tone.  Antalgic gait with cane  Skin: Skin is warm and dry. Laceration noted.  Psychiatric: She has a normal mood and affect.  Nursing note and vitals reviewed.  ED Treatments / Results  Labs (all labs ordered are listed, but only abnormal results are displayed) Labs Reviewed - No data to display  EKG  EKG Interpretation None       Radiology Dg Foot Complete Right  Result Date: 07/16/2016 CLINICAL DATA:  Dropped glass  on right foot today with dorsal laceration EXAM: RIGHT FOOT COMPLETE - 3+ VIEW COMPARISON:  None. FINDINGS: No acute fracture or dislocation is noted. Radiopaque sponge is noted along the dorsal aspect of the foot. No radiopaque foreign body is noted. IMPRESSION: No acute bony abnormality noted. Electronically Signed   By: Inez Catalina M.D.   On: 07/16/2016 17:04    Procedures Procedures    LACERATION REPAIR Performed by: Delsa Grana Consent: Verbal consent obtained. Risks and benefits: risks, benefits and alternatives were discussed Patient identity confirmed: provided demographic data Time out performed prior to procedure Prepped and Draped in normal sterile fashion Wound explored Laceration Location: right anterior ankle/foot Laceration Length: 2.5 cm No Foreign Bodies seen or palpated Anesthesia: local infiltration Local anesthetic: lidocaine 1 % with epinephrine Anesthetic total: 8 ml Irrigation method: syringe Amount of cleaning: standard Skin closure: 4.0 prolene Number of sutures or staples: 4 Technique: simple interrupted Patient  tolerance: Patient tolerated the procedure well with no immediate complications.  Wound edges well approximated, hemostasis achieved   COORDINATION OF CARE: 6:06 PM Discussed next steps with pt. Pt verbalized understanding and is agreeable with the plan.    Medications Ordered in ED Medications - No data to display   Initial Impression / Assessment and Plan / ED Course  I have reviewed the triage vital signs and the nursing notes.  Pertinent labs & imaging results that were available during my care of the patient were reviewed by me and considered in my medical decision making (see chart for details).  Clinical Course   Pt with laceration to right anterior ankle/high dorsum of foot.  Exposed connective tissue, with lacerated superficial tendon.  Persistent slow dark active bleeding suspicious for multiple superficial veins lacerated. Xray negative for body damage, no radiopaque foreign bodies.  Hemostasis achieved with laceration repair and pressure dressing.  I appreciated weakness with dorsiflexion and wished to send pt to follow up with ortho for eval, and therefore performed superficial repair, which was adequate to stop bleeding.  Dr. Johnney Killian examined pt and did not appreciate weakness.  Pt already has difficulty with ambulation due to reported arthritis, and is using cane, feel conservative approach with ortho follow up is indicated.   Pt's tetanus updated.  Pain was managed in the ER.  She was discharged in good condition.    I personally performed the services described in this documentation, which was scribed in my presence. The recorded information has been reviewed and is accurate.    Final Clinical Impressions(s) / ED Diagnoses   Final diagnoses:  Foot contusion, right, initial encounter  Laceration  Laceration of ankle, right, with tendon involvement, initial encounter    New Prescriptions New Prescriptions   No medications on file     Delsa Grana, PA-C 07/19/16  0103    Charlesetta Shanks, MD 07/26/16 2355

## 2016-07-16 NOTE — Discharge Instructions (Signed)
Call Ortho for eval of tendon injury. Call to get an appointment this week if possible.

## 2016-07-16 NOTE — ED Triage Notes (Signed)
Patient reports dropping glass bottle on right foot PTA. Laceration noted to anterior right foot, bleeding moderately in triage. Cleaned and covered with clean dressing in triage. Pedal pulse in tact. Cap refill <3 seconds.

## 2016-07-17 ENCOUNTER — Ambulatory Visit: Payer: Medicaid Other | Admitting: Gastroenterology

## 2016-07-20 ENCOUNTER — Emergency Department (HOSPITAL_COMMUNITY)
Admission: EM | Admit: 2016-07-20 | Discharge: 2016-07-20 | Disposition: A | Discharge status: Home / Self Care | Payer: Medicaid Other | Attending: Emergency Medicine | Admitting: Emergency Medicine

## 2016-07-20 ENCOUNTER — Encounter (HOSPITAL_COMMUNITY): Payer: Self-pay | Admitting: Emergency Medicine

## 2016-07-20 DIAGNOSIS — M25571 Pain in right ankle and joints of right foot: Secondary | ICD-10-CM | POA: Diagnosis present

## 2016-07-20 DIAGNOSIS — Z79899 Other long term (current) drug therapy: Secondary | ICD-10-CM | POA: Insufficient documentation

## 2016-07-20 DIAGNOSIS — I10 Essential (primary) hypertension: Secondary | ICD-10-CM | POA: Insufficient documentation

## 2016-07-20 DIAGNOSIS — F17210 Nicotine dependence, cigarettes, uncomplicated: Secondary | ICD-10-CM | POA: Diagnosis not present

## 2016-07-20 MED ORDER — OXYCODONE-ACETAMINOPHEN 5-325 MG PO TABS
1.0000 | ORAL_TABLET | Freq: Once | ORAL | Status: AC
  Administered 2016-07-20: 1 via ORAL
  Filled 2016-07-20: qty 1

## 2016-07-20 NOTE — Discharge Instructions (Signed)
Please read and follow all provided instructions.  Your diagnoses today include:  1. Right ankle pain     Tests performed today include: Vital signs. See below for your results today.   Medications prescribed:  Take as prescribed   Home care instructions:  Follow any educational materials contained in this packet.  Follow-up instructions: Please follow-up with your Orthopedic Provider for further evaluation of symptoms and treatment   Return instructions:  Please return to the Emergency Department if you do not get better, if you get worse, or new symptoms OR  - Fever (temperature greater than 101.27F)  - Bleeding that does not stop with holding pressure to the area    -Severe pain (please note that you may be more sore the day after your accident)  - Chest Pain  - Difficulty breathing  - Severe nausea or vomiting  - Inability to tolerate food and liquids  - Passing out  - Skin becoming red around your wounds  - Change in mental status (confusion or lethargy)  - New numbness or weakness    Please return if you have any other emergent concerns.  Additional Information:  Your vital signs today were: BP 130/64 (BP Location: Right Arm)    Pulse 76    Temp 98 F (36.7 C) (Oral)    Resp 18    SpO2 97%  If your blood pressure (BP) was elevated above 135/85 this visit, please have this repeated by your doctor within one month. ---------------

## 2016-07-20 NOTE — ED Provider Notes (Signed)
[place] DEPT Provider Note   CSN: [id number] Arrival date & time: 07/20/16  0556  History   Chief Complaint Chief Complaint  Patient presents with  . Ankle Pain    HPI Faith [name] is a 65 y.o. female.  HPI 65 y.o. female presents to the Emergency Department today complaining of right ankle pain since having it repaired on Thursday. Noted a vase falling on her foot. Laceration noted around 2.5 cm with exposed connective tissue. It was repaired and wrapped tightly with dressing. Pt went home and continued to have pain In foot despite narcotic medication and elevation. Noted distal swelling past bandage. Pt never changed wrap or bandage. Rates pain 10/10. Worse with movement. No fevers. No N/V. Has appointment with Orthopedics on Wednesday of next week. No other symptoms noted.     Past Medical History:  Diagnosis Date  . Anxiety   . Arthritis    "thighs; legs; hips" (07/05/2014)  . Bipolar disorder ([place])   . Cholelithiasis 07/2014  . Chronic bronchitis ([place])    "get it q yr"  . Chronic lower back pain   . Depression   . GERD (gastroesophageal reflux disease)   . High cholesterol   . Hypertension   . Schizophrenia [facility])     Patient Active Problem List   Diagnosis Date Noted  . HTN (hypertension) 07/06/2014  . Depression 07/06/2014  . Back pain 07/06/2014  . Chronic cholecystitis 07/05/2014  . Right sided abdominal pain 05/11/2014  . Cholelithiasis 05/11/2014    Past Surgical History:  Procedure Laterality Date  . ABDOMINAL HYSTERECTOMY    . ANKLE FRACTURE SURGERY Right   . ANKLE HARDWARE REMOVAL Right   . APPENDECTOMY  ~ 1963  . BREAST BIOPSY Bilateral   . BREAST CYST EXCISION Bilateral    "not cancer"  . CATARACT EXTRACTION W/ INTRAOCULAR LENS  IMPLANT, BILATERAL    . CHOLECYSTECTOMY N/A 07/05/2014   Procedure: LAPAROSCOPIC CHOLECYSTECTOMY WITH INTRAOPERATIVE CHOLANGIOGRAM;  Surgeon: Gayland Curry, MD;  Location: [place];  Service: General;  Laterality: N/A;  .  EXCISIONAL HEMORRHOIDECTOMY    . LAPAROSCOPIC CHOLECYSTECTOMY  07/05/2014  . MULTIPLE TOOTH EXTRACTIONS  05/2013   "pulled my upper teeth"  . PILONIDAL CYST EXCISION      OB History    No data available       Home Medications    Prior to Admission medications   Medication Sig Start Date End Date Taking? Authorizing Provider  amLODipine-benazepril (LOTREL) 5-10 MG per capsule Take 1 capsule by mouth daily.    Historical Provider, MD  diclofenac (VOLTAREN) 75 MG EC tablet Take 75 mg by mouth 2 (two) times daily.    Historical Provider, MD  diphenhydrAMINE (BENADRYL) 25 MG tablet Take 2 tablets (50 mg total) by mouth at bedtime as needed for sleep. 09/11/15   Evelina Bucy, MD  EPINEPHrine (EPIPEN IJ) Inject 1 each as directed once as needed (allergic reaction).     Historical Provider, MD  erythromycin ophthalmic ointment Place a 1/2 inch ribbon of ointment into the lower eyelid of both eyes three times daily. 12/23/15   Olivia Canter Sam, PA-C  estrogens, conjugated, (PREMARIN) 0.625 MG tablet Take 0.625 mg by mouth daily. Take daily for 21 days then do not take for 7 days.    Historical Provider, MD  fluticasone (FLONASE) 50 MCG/ACT nasal spray Place 2 sprays into both nostrils daily as needed for allergies.  09/10/15   Historical Provider, MD  furosemide (LASIX) 20 MG tablet Take  20 mg by mouth daily.    Historical Provider, MD  Linaclotide Rolan Lipa) 290 MCG CAPS capsule Take 290 mcg by mouth daily.    Historical Provider, MD  loratadine (CLARITIN) 10 MG tablet Take 10 mg by mouth daily.    Historical Provider, MD  methocarbamol (ROBAXIN) 750 MG tablet Take 750 mg by mouth 2 (two) times daily as needed for muscle spasms.    Historical Provider, MD  mometasone (ELOCON) 0.1 % ointment Apply 1 application topically daily.    Historical Provider, MD  omeprazole (PRILOSEC) 20 MG capsule Take 20 mg by mouth 2 (two) times daily before a meal.    Historical Provider, MD  potassium chloride SA  (K-DUR,KLOR-CON) 20 MEQ tablet Take 1 tablet (20 mEq total) by mouth daily. Patient not taking: Reported on 09/11/2015 02/26/14   Orlie Dakin, MD    Family History History reviewed. No pertinent family history.  Social History Social History  Substance Use Topics  . Smoking status: Current Every Day Smoker    Packs/day: 1.50    Years: 48.00    Types: Cigarettes  . Smokeless tobacco: Never Used  . Alcohol use 4.2 oz/week    7 Cans of beer per week     Comment: 07/05/2014 "couple 40oz beers/wk"     Allergies   Penicillins   Review of Systems Review of Systems ROS reviewed and all are negative for acute change except as noted in the HPI.  Physical Exam Updated Vital Signs BP 130/64 (BP Location: Right Arm)   Pulse 76   Temp 98 F (36.7 C) (Oral)   Resp 18   SpO2 97%   Physical Exam  Constitutional: She is oriented to person, place, and time. Vital signs are normal. She appears well-developed and well-nourished.  HENT:  Head: Normocephalic.  Right Ear: Hearing normal.  Left Ear: Hearing normal.  Eyes: Conjunctivae and EOM are normal. Pupils are equal, round, and reactive to light.  Neck: Normal range of motion. Neck supple.  Cardiovascular: Normal rate, regular rhythm and normal heart sounds.   Pulmonary/Chest: Effort normal.  Musculoskeletal:  Right foot with healed 2.5 cm laceration. No obvious infection. No oozing. No erythema. Distal swelling noted once compression bandage removed. Neurovascularly intact. Motor/sensation intact. Cap refill <2sec.    Neurological: She is alert and oriented to person, place, and time.  Skin: Skin is warm and dry.  Psychiatric: She has a normal mood and affect. Her speech is normal and behavior is normal. Thought content normal.   ED Treatments / Results  Labs (all labs ordered are listed, but only abnormal results are displayed) Labs Reviewed - No data to display  EKG  EKG Interpretation None      Radiology No results  found.  Procedures Procedures (including critical care time)  Medications Ordered in ED Medications - No data to display   Initial Impression / Assessment and Plan / ED Course  I have reviewed the triage vital signs and the nursing notes.  Pertinent labs & imaging results that were available during my care of the patient were reviewed by me and considered in my medical decision making (see chart for details).  Clinical Course    Final Clinical Impressions(s) / ED Diagnoses  I have reviewed the relevant previous healthcare records. I obtained HPI from historian. Patient discussed with supervising physician  ED Course:  Assessment: Pt is a 64yF who presents with right ankle pain s/p laceration repair on Thursday. Pt did not remove compression bandaging and  thus developed swelling.. On exam, pt in NAD. Nontoxic/nonseptic appearing. VSS. Afebrile. Right ankle without signs of infection. Laceration well healed with sutures in place. No erythema. No red streaking. Given analgesia in ED. Swelling reduced with elevation in ED. Upon reexamination, pt was found to be sleeping without difficulty. Will leave sutures in place as it is to early to remove them. Plan is to DC home with follow up to scheduled Ortho follow up. At time of discharge, Patient is in no acute distress. Vital Signs are stable. Patient is able to ambulate. Patient able to tolerate PO.   I have reviewed the [place] Controlled Substance Reporting System. Received #90 Percocet 10s on 07-09-16   Disposition/Plan:  DC Home Additional Verbal discharge instructions given and discussed with patient.  Pt Instructed to f/u with Ortho in the next week for evaluation and treatment of symptoms. Return precautions given Pt acknowledges and agrees with plan  Supervising Physician Leo Grosser, MD   Final diagnoses:  Right ankle pain    New Prescriptions New Prescriptions   No medications on file     Shary Decamp,  PA-C 07/20/16 0730    Leo Grosser, MD 07/21/16 [phone]

## 2016-07-20 NOTE — ED Triage Notes (Signed)
Brought in by EMS from home with c/o right foot/ankle pain.  Pt reported that she was seen here last Thursday for right ankle contusion and laceration and was subsequently discharged home after laceration repair.  Pt reported increased and progressive pain and swelling to right ankle.  Pt reports that she has had not slept tonight due to pain.

## 2016-07-20 NOTE — ED Notes (Signed)
Bed: LY59 Expected date:  Expected time:  Means of arrival:  Comments: EMS [phone] Female foot swollen

## 2016-08-22 ENCOUNTER — Encounter (HOSPITAL_BASED_OUTPATIENT_CLINIC_OR_DEPARTMENT_OTHER): Payer: Medicaid Other | Attending: Internal Medicine

## 2016-08-22 DIAGNOSIS — I10 Essential (primary) hypertension: Secondary | ICD-10-CM | POA: Insufficient documentation

## 2016-08-22 DIAGNOSIS — L89612 Pressure ulcer of right heel, stage 2: Secondary | ICD-10-CM | POA: Diagnosis not present

## 2016-08-22 DIAGNOSIS — M1990 Unspecified osteoarthritis, unspecified site: Secondary | ICD-10-CM | POA: Insufficient documentation

## 2016-08-22 DIAGNOSIS — W19XXXA Unspecified fall, initial encounter: Secondary | ICD-10-CM | POA: Insufficient documentation

## 2016-08-22 DIAGNOSIS — S81811A Laceration without foreign body, right lower leg, initial encounter: Secondary | ICD-10-CM | POA: Insufficient documentation

## 2016-08-25 ENCOUNTER — Telehealth (HOSPITAL_BASED_OUTPATIENT_CLINIC_OR_DEPARTMENT_OTHER): Payer: Self-pay | Admitting: *Deleted

## 2016-08-29 DIAGNOSIS — S81811A Laceration without foreign body, right lower leg, initial encounter: Secondary | ICD-10-CM | POA: Diagnosis not present

## 2016-09-05 ENCOUNTER — Encounter (HOSPITAL_BASED_OUTPATIENT_CLINIC_OR_DEPARTMENT_OTHER): Payer: Medicaid Other | Attending: Internal Medicine

## 2016-09-05 DIAGNOSIS — T8189XA Other complications of procedures, not elsewhere classified, initial encounter: Secondary | ICD-10-CM | POA: Diagnosis present

## 2016-09-05 DIAGNOSIS — I10 Essential (primary) hypertension: Secondary | ICD-10-CM | POA: Diagnosis not present

## 2016-09-05 DIAGNOSIS — L97812 Non-pressure chronic ulcer of other part of right lower leg with fat layer exposed: Secondary | ICD-10-CM | POA: Diagnosis not present

## 2016-09-05 DIAGNOSIS — Y838 Other surgical procedures as the cause of abnormal reaction of the patient, or of later complication, without mention of misadventure at the time of the procedure: Secondary | ICD-10-CM | POA: Diagnosis not present

## 2016-09-05 DIAGNOSIS — M1990 Unspecified osteoarthritis, unspecified site: Secondary | ICD-10-CM | POA: Insufficient documentation

## 2016-09-11 ENCOUNTER — Other Ambulatory Visit: Payer: Self-pay | Admitting: Internal Medicine

## 2016-09-11 DIAGNOSIS — Z1231 Encounter for screening mammogram for malignant neoplasm of breast: Secondary | ICD-10-CM

## 2016-09-12 DIAGNOSIS — T8189XA Other complications of procedures, not elsewhere classified, initial encounter: Secondary | ICD-10-CM | POA: Diagnosis not present

## 2016-09-18 ENCOUNTER — Ambulatory Visit: Payer: Medicaid Other

## 2016-09-18 DIAGNOSIS — T8189XA Other complications of procedures, not elsewhere classified, initial encounter: Secondary | ICD-10-CM | POA: Diagnosis not present

## 2016-09-23 ENCOUNTER — Ambulatory Visit: Payer: Medicaid Other

## 2016-09-26 DIAGNOSIS — T8189XA Other complications of procedures, not elsewhere classified, initial encounter: Secondary | ICD-10-CM | POA: Diagnosis not present

## 2016-10-02 ENCOUNTER — Ambulatory Visit: Payer: Medicaid Other

## 2016-10-03 ENCOUNTER — Encounter (HOSPITAL_BASED_OUTPATIENT_CLINIC_OR_DEPARTMENT_OTHER): Payer: Medicaid Other | Attending: Internal Medicine

## 2016-10-06 ENCOUNTER — Encounter (HOSPITAL_BASED_OUTPATIENT_CLINIC_OR_DEPARTMENT_OTHER): Payer: Medicaid Other | Attending: Internal Medicine

## 2016-10-06 DIAGNOSIS — L89612 Pressure ulcer of right heel, stage 2: Secondary | ICD-10-CM | POA: Diagnosis not present

## 2016-10-06 DIAGNOSIS — X58XXXD Exposure to other specified factors, subsequent encounter: Secondary | ICD-10-CM | POA: Insufficient documentation

## 2016-10-06 DIAGNOSIS — S91311D Laceration without foreign body, right foot, subsequent encounter: Secondary | ICD-10-CM | POA: Diagnosis present

## 2016-10-06 DIAGNOSIS — I10 Essential (primary) hypertension: Secondary | ICD-10-CM | POA: Insufficient documentation

## 2016-10-06 DIAGNOSIS — F419 Anxiety disorder, unspecified: Secondary | ICD-10-CM | POA: Diagnosis not present

## 2016-10-06 DIAGNOSIS — G40909 Epilepsy, unspecified, not intractable, without status epilepticus: Secondary | ICD-10-CM | POA: Diagnosis not present

## 2016-10-06 DIAGNOSIS — M1990 Unspecified osteoarthritis, unspecified site: Secondary | ICD-10-CM | POA: Diagnosis not present

## 2016-10-13 DIAGNOSIS — S91311D Laceration without foreign body, right foot, subsequent encounter: Secondary | ICD-10-CM | POA: Diagnosis not present

## 2016-10-31 ENCOUNTER — Encounter (HOSPITAL_BASED_OUTPATIENT_CLINIC_OR_DEPARTMENT_OTHER): Payer: Medicaid Other | Attending: Internal Medicine

## 2016-10-31 DIAGNOSIS — W19XXXA Unspecified fall, initial encounter: Secondary | ICD-10-CM | POA: Insufficient documentation

## 2016-10-31 DIAGNOSIS — I739 Peripheral vascular disease, unspecified: Secondary | ICD-10-CM | POA: Insufficient documentation

## 2016-10-31 DIAGNOSIS — I10 Essential (primary) hypertension: Secondary | ICD-10-CM | POA: Insufficient documentation

## 2016-10-31 DIAGNOSIS — S81801A Unspecified open wound, right lower leg, initial encounter: Secondary | ICD-10-CM | POA: Diagnosis not present

## 2016-10-31 DIAGNOSIS — S91311A Laceration without foreign body, right foot, initial encounter: Secondary | ICD-10-CM | POA: Diagnosis present

## 2016-11-07 DIAGNOSIS — S91311A Laceration without foreign body, right foot, initial encounter: Secondary | ICD-10-CM | POA: Diagnosis not present

## 2016-11-13 ENCOUNTER — Ambulatory Visit (HOSPITAL_COMMUNITY)
Admission: RE | Admit: 2016-11-13 | Discharge: 2016-11-13 | Disposition: A | Discharge status: Home / Self Care | Payer: Medicaid Other | Source: Ambulatory Visit | Attending: Vascular Surgery | Admitting: Vascular Surgery

## 2016-11-13 ENCOUNTER — Other Ambulatory Visit: Payer: Self-pay | Admitting: Internal Medicine

## 2016-11-13 DIAGNOSIS — M79609 Pain in unspecified limb: Secondary | ICD-10-CM | POA: Diagnosis not present

## 2016-11-13 DIAGNOSIS — X58XXXD Exposure to other specified factors, subsequent encounter: Secondary | ICD-10-CM | POA: Insufficient documentation

## 2016-11-13 DIAGNOSIS — S91311D Laceration without foreign body, right foot, subsequent encounter: Secondary | ICD-10-CM

## 2016-11-14 DIAGNOSIS — S91311A Laceration without foreign body, right foot, initial encounter: Secondary | ICD-10-CM | POA: Diagnosis not present

## 2016-11-21 ENCOUNTER — Encounter: Payer: Self-pay | Admitting: Vascular Surgery

## 2016-11-21 DIAGNOSIS — S91311A Laceration without foreign body, right foot, initial encounter: Secondary | ICD-10-CM | POA: Diagnosis not present

## 2016-11-27 ENCOUNTER — Other Ambulatory Visit: Payer: Self-pay | Admitting: Internal Medicine

## 2016-11-27 ENCOUNTER — Ambulatory Visit
Admission: RE | Admit: 2016-11-27 | Discharge: 2016-11-27 | Disposition: A | Discharge status: Home / Self Care | Payer: Medicaid Other | Source: Ambulatory Visit | Attending: Internal Medicine | Admitting: Internal Medicine

## 2016-11-27 DIAGNOSIS — M839 Adult osteomalacia, unspecified: Secondary | ICD-10-CM

## 2016-11-28 DIAGNOSIS — S91311A Laceration without foreign body, right foot, initial encounter: Secondary | ICD-10-CM | POA: Diagnosis not present

## 2016-12-05 ENCOUNTER — Encounter (HOSPITAL_BASED_OUTPATIENT_CLINIC_OR_DEPARTMENT_OTHER): Payer: Medicaid Other | Attending: Internal Medicine

## 2016-12-05 ENCOUNTER — Encounter: Payer: Self-pay | Admitting: Vascular Surgery

## 2016-12-05 ENCOUNTER — Ambulatory Visit (INDEPENDENT_AMBULATORY_CARE_PROVIDER_SITE_OTHER): Payer: Medicaid Other | Admitting: Vascular Surgery

## 2016-12-05 ENCOUNTER — Other Ambulatory Visit: Payer: Self-pay

## 2016-12-05 VITALS — BP 124/68 | HR 74 | Temp 97.5°F | Resp 24 | Ht 61.5 in | Wt 142.5 lb

## 2016-12-05 DIAGNOSIS — T148XXA Other injury of unspecified body region, initial encounter: Secondary | ICD-10-CM | POA: Diagnosis not present

## 2016-12-05 DIAGNOSIS — I10 Essential (primary) hypertension: Secondary | ICD-10-CM | POA: Insufficient documentation

## 2016-12-05 DIAGNOSIS — L89612 Pressure ulcer of right heel, stage 2: Secondary | ICD-10-CM | POA: Diagnosis not present

## 2016-12-05 DIAGNOSIS — F17200 Nicotine dependence, unspecified, uncomplicated: Secondary | ICD-10-CM | POA: Insufficient documentation

## 2016-12-05 DIAGNOSIS — I739 Peripheral vascular disease, unspecified: Secondary | ICD-10-CM

## 2016-12-05 DIAGNOSIS — X58XXXA Exposure to other specified factors, initial encounter: Secondary | ICD-10-CM | POA: Insufficient documentation

## 2016-12-05 DIAGNOSIS — S91311A Laceration without foreign body, right foot, initial encounter: Secondary | ICD-10-CM | POA: Diagnosis present

## 2016-12-05 NOTE — Progress Notes (Signed)
Patient ID: Faith [name], female   DOB: 1951-04-28, 66 y.o.   MRN: [id number]  Reason for Consult: New Evaluation (referred by Dr. Dellia Nims; nonhealing wound (R) LE)   Referred by [name], MD  Subjective:     HPI:  Faith [name] is a 66 y.o. female with history of hypertension and chronic every day smoker presents with right dorsal foot and overlying achilles ulcerations. She states that these have been present since August she didn't valuated the wound clinic since that time. She has never had wounds before. She does have limitations to her walking which is cramping of her right calf which has been present for several years. She is never had wounds like this before. She denies fevers chills or any infection of the right foot. She is religious about attending the wound care center on Fridays. She does have a chronic cough as well secondary to her smoking. States her blood pressure is well-controlled she is nondiabetic. She states that her cholesterol is also well controlled she does not take statin or aspirin and has been taking over-the-counter ibuprofen and pain in her wound sites.  Past Medical History:  Diagnosis Date  . Anxiety   . Arthritis    "thighs; legs; hips" (07/05/2014)  . Bipolar disorder ([place])   . Cholelithiasis 07/2014  . Chronic bronchitis ([place])    "get it q yr"  . Chronic lower back pain   . Depression   . GERD (gastroesophageal reflux disease)   . High cholesterol   . Hypertension   . Schizophrenia ([place])    Family History  Problem Relation Age of Onset  . Cancer Mother 63    colon  . Diabetes Mother   . Hypertension Mother   . Cancer Father   . Diabetes Father   . Hypertension Father    Past Surgical History:  Procedure Laterality Date  . ABDOMINAL HYSTERECTOMY    . ANKLE FRACTURE SURGERY Right   . ANKLE HARDWARE REMOVAL Right   . APPENDECTOMY  ~ 1963  . BREAST BIOPSY Bilateral   . BREAST CYST EXCISION Bilateral    "not cancer"  . CATARACT  EXTRACTION W/ INTRAOCULAR LENS  IMPLANT, BILATERAL    . CHOLECYSTECTOMY N/A 07/05/2014   Procedure: LAPAROSCOPIC CHOLECYSTECTOMY WITH INTRAOPERATIVE CHOLANGIOGRAM;  Surgeon: Gayland Curry, MD;  Location: [facility];  Service: General;  Laterality: N/A;  . EXCISIONAL HEMORRHOIDECTOMY    . LAPAROSCOPIC CHOLECYSTECTOMY  07/05/2014  . MULTIPLE TOOTH EXTRACTIONS  05/2013   "pulled my upper teeth"  . PILONIDAL CYST EXCISION      Short Social History:  Social History  Substance Use Topics  . Smoking status: Current Every Day Smoker    Packs/day: 1.50    Years: 48.00    Types: Cigarettes  . Smokeless tobacco: Never Used  . Alcohol use 4.2 oz/week    7 Cans of beer per week     Comment: 07/05/2014 "couple 40oz beers/wk"    Allergies  Allergen Reactions  . Penicillins Other (See Comments)    REACTION: Yeast infection    Current Outpatient Prescriptions  Medication Sig Dispense Refill  . amLODipine-benazepril (LOTREL) 5-10 MG per capsule Take 1 capsule by mouth daily.    . diclofenac (VOLTAREN) 75 MG EC tablet Take 75 mg by mouth 2 (two) times daily.    . diphenhydrAMINE (BENADRYL) 25 MG tablet Take 2 tablets (50 mg total) by mouth at bedtime as needed for sleep. 20 tablet 0  . EPINEPHrine (EPIPEN IJ) Inject  1 each as directed once as needed (allergic reaction).     Marland Kitchen erythromycin ophthalmic ointment Place a 1/2 inch ribbon of ointment into the lower eyelid of both eyes three times daily. 3.5 g 0  . estrogens, conjugated, (PREMARIN) 0.625 MG tablet Take 0.625 mg by mouth daily. Take daily for 21 days then do not take for 7 days.    . fluticasone (FLONASE) 50 MCG/ACT nasal spray Place 2 sprays into both nostrils daily as needed for allergies.   0  . furosemide (LASIX) 20 MG tablet Take 20 mg by mouth daily.    . Linaclotide (LINZESS) 290 MCG CAPS capsule Take 290 mcg by mouth daily.    Marland Kitchen loratadine (CLARITIN) 10 MG tablet Take 10 mg by mouth daily.    . methocarbamol (ROBAXIN) 750 MG tablet Take 750  mg by mouth 2 (two) times daily as needed for muscle spasms.    . mometasone (ELOCON) 0.1 % ointment Apply 1 application topically daily.    Marland Kitchen omeprazole (PRILOSEC) 20 MG capsule Take 20 mg by mouth 2 (two) times daily before a meal.    . potassium chloride SA (K-DUR,KLOR-CON) 20 MEQ tablet Take 1 tablet (20 mEq total) by mouth daily. 10 tablet 0   No current facility-administered medications for this visit.     Review of Systems  Constitutional: Positive for unexpected weight change. Negative for chills and fever.  HENT: HENT negative.  Eyes: Eyes negative.  Respiratory: Positive for cough and shortness of breath.  Cardiovascular: Positive for claudication.  GI: Positive for abdominal pain and nausea.  Musculoskeletal: Positive for leg pain.  Skin: Positive for wound.  Neurological: Neurological negative. Hematologic: Hematologic/lymphatic negative.  Psychiatric: Psychiatric negative.        Objective:  Objective   Vitals:   12/05/16 1222  BP: 124/68  Pulse: 74  Resp: (!) 24  Temp: 97.5 F (36.4 C)  TempSrc: Oral  SpO2: 98%  Weight: 142 lb 8 oz (64.6 kg)  Height: 5' 1.5" (1.562 m)   Body mass index is 26.49 kg/m.  Physical Exam  Constitutional: She is oriented to person, place, and time. She appears well-developed.  HENT:  Head: Atraumatic.  Eyes: EOM are normal.  Neck: Normal range of motion.  Cardiovascular: Normal rate.   Bilateral 2+ femoral pulses Monophasic R dp/pt, non-palpable popliteal on right Left sided biphasic pt/dp, 1+ palpable popliteal on left   Pulmonary/Chest: Effort normal.  Abdominal: Soft. There is tenderness.  Musculoskeletal: She exhibits no edema.  1cm ulceration to dorsum right foot and right heel without erythema  Lymphadenopathy:    She has no cervical adenopathy.  Neurological: She is alert and oriented to person, place, and time.  Skin: Skin is warm and dry.  Psychiatric: She has a normal mood and affect. Her behavior is  normal. Thought content normal.    Data:      Assessment/Plan:     66 year old female history of hypertension and now wounds on her right foot and ankle at the heel for the past 5 months. She has a ABI of 0.58 on that side with monophasic signals. This time as it is prudent to pursue angiography and I discussed the options with the patient of doing nothing versus primary amputation versus angiography and she agrees to proceed. We discussed the likelihood of outpatient procedure. I've also assess her to start taking aspirin and she does not take a statin although she has current GI issues and I will not start this today.  She is to continue going to wound Center and we'll get her scheduled for angiogram at the next possible ointment.     Waynetta Sandy MD [facility]

## 2016-12-11 DIAGNOSIS — S91311A Laceration without foreign body, right foot, initial encounter: Secondary | ICD-10-CM | POA: Diagnosis not present

## 2016-12-12 ENCOUNTER — Ambulatory Visit: Payer: Medicaid Other

## 2016-12-15 ENCOUNTER — Encounter: Payer: Self-pay | Admitting: Gastroenterology

## 2016-12-26 DIAGNOSIS — S91311A Laceration without foreign body, right foot, initial encounter: Secondary | ICD-10-CM | POA: Diagnosis not present

## 2016-12-31 ENCOUNTER — Ambulatory Visit: Payer: Medicaid Other

## 2017-01-01 ENCOUNTER — Other Ambulatory Visit: Payer: Self-pay | Admitting: *Deleted

## 2017-01-02 ENCOUNTER — Encounter (HOSPITAL_BASED_OUTPATIENT_CLINIC_OR_DEPARTMENT_OTHER): Payer: Medicaid Other | Attending: Internal Medicine

## 2017-01-02 DIAGNOSIS — Z87891 Personal history of nicotine dependence: Secondary | ICD-10-CM | POA: Diagnosis not present

## 2017-01-02 DIAGNOSIS — M79661 Pain in right lower leg: Secondary | ICD-10-CM | POA: Insufficient documentation

## 2017-01-02 DIAGNOSIS — M79651 Pain in right thigh: Secondary | ICD-10-CM | POA: Insufficient documentation

## 2017-01-02 DIAGNOSIS — L89612 Pressure ulcer of right heel, stage 2: Secondary | ICD-10-CM | POA: Insufficient documentation

## 2017-01-02 DIAGNOSIS — I10 Essential (primary) hypertension: Secondary | ICD-10-CM | POA: Diagnosis not present

## 2017-01-02 DIAGNOSIS — I70201 Unspecified atherosclerosis of native arteries of extremities, right leg: Secondary | ICD-10-CM | POA: Diagnosis not present

## 2017-01-05 ENCOUNTER — Encounter: Payer: Self-pay | Admitting: Gastroenterology

## 2017-01-07 ENCOUNTER — Ambulatory Visit (HOSPITAL_BASED_OUTPATIENT_CLINIC_OR_DEPARTMENT_OTHER)
Admission: RE | Admit: 2017-01-07 | Discharge: 2017-01-07 | Disposition: A | Discharge status: Home / Self Care | Payer: Medicaid Other | Source: Ambulatory Visit | Attending: Vascular Surgery | Admitting: Vascular Surgery

## 2017-01-07 ENCOUNTER — Other Ambulatory Visit: Payer: Self-pay

## 2017-01-07 ENCOUNTER — Encounter (HOSPITAL_COMMUNITY): Payer: Self-pay | Admitting: Vascular Surgery

## 2017-01-07 ENCOUNTER — Encounter (HOSPITAL_COMMUNITY)
Admission: RE | Disposition: A | Discharge status: Home / Self Care | Payer: Self-pay | Source: Ambulatory Visit | Attending: Vascular Surgery

## 2017-01-07 ENCOUNTER — Ambulatory Visit (HOSPITAL_COMMUNITY)
Admission: RE | Admit: 2017-01-07 | Discharge: 2017-01-07 | Disposition: A | Discharge status: Home / Self Care | Payer: Medicaid Other | Source: Ambulatory Visit | Attending: Vascular Surgery | Admitting: Vascular Surgery

## 2017-01-07 DIAGNOSIS — E7800 Pure hypercholesterolemia, unspecified: Secondary | ICD-10-CM | POA: Insufficient documentation

## 2017-01-07 DIAGNOSIS — F419 Anxiety disorder, unspecified: Secondary | ICD-10-CM | POA: Diagnosis not present

## 2017-01-07 DIAGNOSIS — I70235 Atherosclerosis of native arteries of right leg with ulceration of other part of foot: Secondary | ICD-10-CM | POA: Insufficient documentation

## 2017-01-07 DIAGNOSIS — I998 Other disorder of circulatory system: Secondary | ICD-10-CM | POA: Diagnosis not present

## 2017-01-07 DIAGNOSIS — L97319 Non-pressure chronic ulcer of right ankle with unspecified severity: Secondary | ICD-10-CM | POA: Diagnosis not present

## 2017-01-07 DIAGNOSIS — M545 Low back pain: Secondary | ICD-10-CM | POA: Diagnosis not present

## 2017-01-07 DIAGNOSIS — Z7951 Long term (current) use of inhaled steroids: Secondary | ICD-10-CM | POA: Diagnosis not present

## 2017-01-07 DIAGNOSIS — L97519 Non-pressure chronic ulcer of other part of right foot with unspecified severity: Secondary | ICD-10-CM | POA: Diagnosis not present

## 2017-01-07 DIAGNOSIS — I70233 Atherosclerosis of native arteries of right leg with ulceration of ankle: Secondary | ICD-10-CM | POA: Insufficient documentation

## 2017-01-07 DIAGNOSIS — M1990 Unspecified osteoarthritis, unspecified site: Secondary | ICD-10-CM | POA: Diagnosis not present

## 2017-01-07 DIAGNOSIS — K219 Gastro-esophageal reflux disease without esophagitis: Secondary | ICD-10-CM | POA: Insufficient documentation

## 2017-01-07 DIAGNOSIS — F17210 Nicotine dependence, cigarettes, uncomplicated: Secondary | ICD-10-CM | POA: Diagnosis not present

## 2017-01-07 DIAGNOSIS — F319 Bipolar disorder, unspecified: Secondary | ICD-10-CM | POA: Insufficient documentation

## 2017-01-07 DIAGNOSIS — Z880 Allergy status to penicillin: Secondary | ICD-10-CM | POA: Insufficient documentation

## 2017-01-07 DIAGNOSIS — Z7982 Long term (current) use of aspirin: Secondary | ICD-10-CM | POA: Insufficient documentation

## 2017-01-07 DIAGNOSIS — Z01810 Encounter for preprocedural cardiovascular examination: Secondary | ICD-10-CM

## 2017-01-07 DIAGNOSIS — I10 Essential (primary) hypertension: Secondary | ICD-10-CM | POA: Diagnosis not present

## 2017-01-07 DIAGNOSIS — J42 Unspecified chronic bronchitis: Secondary | ICD-10-CM | POA: Insufficient documentation

## 2017-01-07 DIAGNOSIS — F209 Schizophrenia, unspecified: Secondary | ICD-10-CM | POA: Insufficient documentation

## 2017-01-07 DIAGNOSIS — Z8249 Family history of ischemic heart disease and other diseases of the circulatory system: Secondary | ICD-10-CM | POA: Diagnosis not present

## 2017-01-07 DIAGNOSIS — I739 Peripheral vascular disease, unspecified: Secondary | ICD-10-CM | POA: Diagnosis present

## 2017-01-07 DIAGNOSIS — G8929 Other chronic pain: Secondary | ICD-10-CM | POA: Diagnosis not present

## 2017-01-07 HISTORY — PX: LOWER EXTREMITY ANGIOGRAPHY: CATH118251

## 2017-01-07 HISTORY — PX: ABDOMINAL AORTOGRAM: CATH118222

## 2017-01-07 LAB — POCT I-STAT, CHEM 8
BUN: 33 mg/dL — AB (ref 6–20)
CALCIUM ION: 1.44 mmol/L — AB (ref 1.15–1.40)
CHLORIDE: 108 mmol/L (ref 101–111)
Creatinine, Ser: 0.8 mg/dL (ref 0.44–1.00)
Glucose, Bld: 107 mg/dL — ABNORMAL HIGH (ref 65–99)
HEMATOCRIT: 40 % (ref 36.0–46.0)
Hemoglobin: 13.6 g/dL (ref 12.0–15.0)
POTASSIUM: 2.4 mmol/L — AB (ref 3.5–5.1)
SODIUM: 144 mmol/L (ref 135–145)
TCO2: 22 mmol/L (ref 0–100)

## 2017-01-07 SURGERY — ABDOMINAL AORTOGRAM

## 2017-01-07 MED ORDER — LIDOCAINE HCL (PF) 1 % IJ SOLN
INTRAMUSCULAR | Status: DC | PRN
  Administered 2017-01-07: 18 mL

## 2017-01-07 MED ORDER — MORPHINE SULFATE (PF) 4 MG/ML IV SOLN
INTRAVENOUS | Status: AC
  Filled 2017-01-07: qty 1

## 2017-01-07 MED ORDER — SODIUM CHLORIDE 0.9 % IV SOLN: 1.0000 mL/kg/h | INTRAVENOUS | Status: DC

## 2017-01-07 MED ORDER — MIDAZOLAM HCL 2 MG/2ML IJ SOLN
INTRAMUSCULAR | Status: DC | PRN
  Administered 2017-01-07: 0.5 mg via INTRAVENOUS

## 2017-01-07 MED ORDER — OXYCODONE-ACETAMINOPHEN 5-325 MG PO TABS: 1.0000 | ORAL_TABLET | ORAL | Status: DC | PRN

## 2017-01-07 MED ORDER — MIDAZOLAM HCL 2 MG/2ML IJ SOLN
INTRAMUSCULAR | Status: AC
  Filled 2017-01-07: qty 2

## 2017-01-07 MED ORDER — HEPARIN (PORCINE) IN NACL 2-0.9 UNIT/ML-% IJ SOLN
INTRAMUSCULAR | Status: DC | PRN
Start: 2017-01-07 — End: 2017-01-07
  Administered 2017-01-07: 1000 mL

## 2017-01-07 MED ORDER — IODIXANOL 320 MG/ML IV SOLN
INTRAVENOUS | Status: DC | PRN
  Administered 2017-01-07: 120 mL via INTRAVENOUS

## 2017-01-07 MED ORDER — MORPHINE SULFATE (PF) 4 MG/ML IV SOLN
2.0000 mg | INTRAVENOUS | Status: DC | PRN
  Administered 2017-01-07: 2 mg via INTRAVENOUS

## 2017-01-07 MED ORDER — POTASSIUM CHLORIDE 20 MEQ/15ML (10%) PO SOLN
60.0000 meq | ORAL | Status: AC
  Administered 2017-01-07: 60 meq via ORAL
  Filled 2017-01-07: qty 45

## 2017-01-07 MED ORDER — SODIUM CHLORIDE 0.9 % IV SOLN
INTRAVENOUS | Status: DC
  Administered 2017-01-07: 08:00:00 via INTRAVENOUS

## 2017-01-07 MED ORDER — FENTANYL CITRATE (PF) 100 MCG/2ML IJ SOLN
INTRAMUSCULAR | Status: AC
  Filled 2017-01-07: qty 2

## 2017-01-07 MED ORDER — HEPARIN (PORCINE) IN NACL 2-0.9 UNIT/ML-% IJ SOLN
INTRAMUSCULAR | Status: AC
  Filled 2017-01-07: qty 1000

## 2017-01-07 MED ORDER — FENTANYL CITRATE (PF) 100 MCG/2ML IJ SOLN
INTRAMUSCULAR | Status: DC | PRN
  Administered 2017-01-07: 50 ug via INTRAVENOUS

## 2017-01-07 MED ORDER — LIDOCAINE HCL (PF) 1 % IJ SOLN
INTRAMUSCULAR | Status: AC
  Filled 2017-01-07: qty 30

## 2017-01-07 SURGICAL SUPPLY — 8 items
CATH OMNI FLUSH 5F 65CM (CATHETERS) ×1 IMPLANT
KIT PV (KITS) ×3 IMPLANT
SHEATH PINNACLE 5F 10CM (SHEATH) ×1 IMPLANT
SYR MEDRAD MARK V 150ML (SYRINGE) ×3 IMPLANT
TRANSDUCER W/STOPCOCK (MISCELLANEOUS) ×3 IMPLANT
TRAY PV CATH (CUSTOM PROCEDURE TRAY) ×3 IMPLANT
WIRE BENTSON .035X145CM (WIRE) ×1 IMPLANT
WIRE MINI STICK MAX (SHEATH) ×1 IMPLANT

## 2017-01-07 NOTE — Progress Notes (Signed)
Lower Extremity Vein Map    Right Great Saphenous Vein   Segment Diameter Comment  1. Origin 2.39m   2. High Thigh 2.16mbranch  3. Mid Thigh 2.0347m 4. Low Thigh 1.36m74m5. At Knee 1.54mm19m. High Calf 1.14mm 10m Low Calf mm   8. Ankle mm    mm    mm    mm      Left Great Saphenous Vein  Segment Diameter Comment  1. Origin 2.68mm  8mHigh Thigh 2.67mm   68mid Thigh 2.81mm   471mw Thigh 1.71mm   5.73mKnee 2.07mm   6. 52m Calf 1.84mm   7. L57malf 1.42mm   8. An79m1.55mm    mm   14m   mm     Rithika Seel Eunice, RLandry Mellow7/2018

## 2017-01-07 NOTE — Progress Notes (Signed)
Site area: Left groin 5 french arterial sheath was removed  Site Prior to Removal:  Level 0  Pressure Applied For 15 MINUTES    Bedrest Beginning at 0945am  Manual:   Yes.    Patient Status During Pull:  stable  Post Pull Groin Site:  Level 0  Post Pull Instructions Given:  Yes.    Post Pull Pulses Present:  Yes.    Dressing Applied:  Yes.    Comments:  VS remain stable during sheath pull.  Morphine 2 mg IV given for back and right leg pain .  Pain reduce from 8 to 6

## 2017-01-07 NOTE — Discharge Instructions (Signed)
Angiogram, Care After Refer to this sheet in the next few weeks. These instructions provide you with information about caring for yourself after your procedure. Your health care provider may also give you more specific instructions. Your treatment has been planned according to current medical practices, but problems sometimes occur. Call your health care provider if you have any problems or questions after your procedure. What can I expect after the procedure? After your procedure, it is typical to have the following:  Bruising at the catheter insertion site that usually fades within 1-2 weeks.  Blood collecting in the tissue (hematoma) that may be painful to the touch. It should usually decrease in size and tenderness within 1-2 weeks. Follow these instructions at home:  Take medicines only as directed by your health care provider.  You may shower 24-48 hours after the procedure or as directed by your health care provider. Remove the bandage (dressing) and gently wash the site with plain soap and water. Pat the area dry with a clean towel. Do not rub the site, because this may cause bleeding.  Do not take baths, swim, or use a hot tub until your health care provider approves.  Check your insertion site every day for redness, swelling, or drainage.  Do not apply powder or lotion to the site.  Do not lift over 10 lb (4.5 kg) for 5 days after your procedure or as directed by your health care provider.  Ask your health care provider when it is okay to:  Return to work or school.  Resume usual physical activities or sports.  Resume sexual activity.  Do not drive home if you are discharged the same day as the procedure. Have someone else drive you.  You may drive 24 hours after the procedure unless otherwise instructed by your health care provider.  Do not operate machinery or power tools for 24 hours after the procedure or as directed by your health care provider.  If your procedure  was done as an outpatient procedure, which means that you went home the same day as your procedure, a responsible adult should be with you for the first 24 hours after you arrive home.  Keep all follow-up visits as directed by your health care provider. This is important. Contact a health care provider if:  You have a fever.  You have chills.  You have increased bleeding from the catheter insertion site. Hold pressure on the site. Call 911 Get help right away if:  You have unusual pain at the catheter insertion site.  You have redness, warmth, or swelling at the catheter insertion site.  You have drainage (other than a small amount of blood on the dressing) from the catheter insertion site.  The catheter insertion site is bleeding, and the bleeding does not stop after 30 minutes of holding steady pressure on the site.  The area near or just beyond the catheter insertion site becomes pale, cool, tingly, or numb. This information is not intended to replace advice given to you by your health care provider. Make sure you discuss any questions you have with your health care provider. Document Released: 06/05/2005 Document Revised: 04/24/2016 Document Reviewed: 04/20/2013 Elsevier Interactive Patient Education  2017 [employer].

## 2017-01-07 NOTE — H&P (Signed)
Patient ID: Faith [name], female   DOB: [birth year], 66 y.o.   MRN: [id number]  Reason for Consult: New Evaluation (referred by Dr. Dellia Nims; nonhealing wound (R) LE)   Referred by [name], MD  Subjective:     HPI:  Faith [name] is a 66 y.o. female with history of hypertension and chronic every day smoker presents with right dorsal foot and overlying achilles ulcerations. She states that these have been present since August she didn't valuated the wound clinic since that time. She has never had wounds before. She does have limitations to her walking which is cramping of her right calf which has been present for several years. She is never had wounds like this before. She denies fevers chills or any infection of the right foot. She is religious about attending the wound care center on Fridays. She does have a chronic cough as well secondary to her smoking. States her blood pressure is well-controlled she is nondiabetic. She states that her cholesterol is also well controlled she does not take statin or aspirin and has been taking over-the-counter ibuprofen and pain in her wound sites.      Past Medical History:  Diagnosis Date  . Anxiety   . Arthritis    "thighs; legs; hips" (07/05/2014)  . Bipolar disorder ([place])   . Cholelithiasis 07/2014  . Chronic bronchitis ([place])    "get it q yr"  . Chronic lower back pain   . Depression   . GERD (gastroesophageal reflux disease)   . High cholesterol   . Hypertension   . Schizophrenia ([place])          Family History  Problem Relation Age of Onset  . Cancer Mother 52    colon  . Diabetes Mother   . Hypertension Mother   . Cancer Father   . Diabetes Father   . Hypertension Father         Past Surgical History:  Procedure Laterality Date  . ABDOMINAL HYSTERECTOMY    . ANKLE FRACTURE SURGERY Right   . ANKLE HARDWARE REMOVAL Right   . APPENDECTOMY  ~ 1963  . BREAST BIOPSY Bilateral   . BREAST CYST  EXCISION Bilateral    "not cancer"  . CATARACT EXTRACTION W/ INTRAOCULAR LENS  IMPLANT, BILATERAL    . CHOLECYSTECTOMY N/A 07/05/2014   Procedure: LAPAROSCOPIC CHOLECYSTECTOMY WITH INTRAOPERATIVE CHOLANGIOGRAM;  Surgeon: Gayland Curry, MD;  Location: [facility];  Service: General;  Laterality: N/A;  . EXCISIONAL HEMORRHOIDECTOMY    . LAPAROSCOPIC CHOLECYSTECTOMY  07/05/2014  . MULTIPLE TOOTH EXTRACTIONS  05/2013   "pulled my upper teeth"  . PILONIDAL CYST EXCISION      Short Social History:         Social History  Substance Use Topics  . Smoking status: Current Every Day Smoker    Packs/day: 1.50    Years: 48.00    Types: Cigarettes  . Smokeless tobacco: Never Used  . Alcohol use 4.2 oz/week     7 Cans of beer per week      Comment: 07/05/2014 "couple 40oz beers/wk"         Allergies  Allergen Reactions  . Penicillins Other (See Comments)    REACTION: Yeast infection          Current Outpatient Prescriptions  Medication Sig Dispense Refill  . amLODipine-benazepril (LOTREL) 5-10 MG per capsule Take 1 capsule by mouth daily.    . diclofenac (VOLTAREN) 75 MG EC tablet Take 75 mg by mouth 2 (  two) times daily.    . diphenhydrAMINE (BENADRYL) 25 MG tablet Take 2 tablets (50 mg total) by mouth at bedtime as needed for sleep. 20 tablet 0  . EPINEPHrine (EPIPEN IJ) Inject 1 each as directed once as needed (allergic reaction).     Marland Kitchen erythromycin ophthalmic ointment Place a 1/2 inch ribbon of ointment into the lower eyelid of both eyes three times daily. 3.5 g 0  . estrogens, conjugated, (PREMARIN) 0.625 MG tablet Take 0.625 mg by mouth daily. Take daily for 21 days then do not take for 7 days.    . fluticasone (FLONASE) 50 MCG/ACT nasal spray Place 2 sprays into both nostrils daily as needed for allergies.   0  . furosemide (LASIX) 20 MG tablet Take 20 mg by mouth daily.    . Linaclotide (LINZESS) 290 MCG CAPS capsule Take 290 mcg by mouth daily.      Marland Kitchen loratadine (CLARITIN) 10 MG tablet Take 10 mg by mouth daily.    . methocarbamol (ROBAXIN) 750 MG tablet Take 750 mg by mouth 2 (two) times daily as needed for muscle spasms.    . mometasone (ELOCON) 0.1 % ointment Apply 1 application topically daily.    Marland Kitchen omeprazole (PRILOSEC) 20 MG capsule Take 20 mg by mouth 2 (two) times daily before a meal.    . potassium chloride SA (K-DUR,KLOR-CON) 20 MEQ tablet Take 1 tablet (20 mEq total) by mouth daily. 10 tablet 0   No current facility-administered medications for this visit.     Review of Systems  Constitutional: Positive for unexpected weight change. Negative for chills and fever.  HENT: HENT negative.  Eyes: Eyes negative.  Respiratory: Positive for cough and shortness of breath.  Cardiovascular: Positive for claudication.  GI: Positive for abdominal pain and nausea.  Musculoskeletal: Positive for leg pain.  Skin: Positive for wound.  Neurological: Neurological negative. Hematologic: Hematologic/lymphatic negative.  Psychiatric: Psychiatric negative.        Objective:  Objective      Vitals:   12/05/16 1222  BP: 124/68  Pulse: 74  Resp: (!) 24  Temp: 97.5 F (36.4 C)  TempSrc: Oral  SpO2: 98%  Weight: 142 lb 8 oz (64.6 kg)  Height: 5' 1.5" (1.562 m)   Body mass index is 26.49 kg/m.  Physical Exam  Constitutional: She is oriented to person, place, and time. She appears well-developed.  HENT:  Head: Atraumatic.  Eyes: EOM are normal.  Neck: Normal range of motion.  Cardiovascular: Normal rate.   Bilateral 2+ femoral pulses Monophasic R dp/pt, non-palpable popliteal on right Left sided biphasic pt/dp, 1+ palpable popliteal on left   Pulmonary/Chest: Effort normal.  Abdominal: Soft. There is tenderness.  Musculoskeletal: She exhibits no edema.  1cm ulceration to dorsum right foot and right heel without erythema  Lymphadenopathy:    She has no cervical adenopathy.  Neurological: She is alert and  oriented to person, place, and time.  Skin: Skin is warm and dry.  Psychiatric: She has a normal mood and affect. Her behavior is normal. Thought content normal.    Data:      Assessment/Plan:   66 year old female history of hypertension and now wounds on her right foot and ankle at the heel for the past 5 months. She has a ABI of 0.58 on that side with monophasic signals. Plan for aortogram and ble runoff with possible intervention of the right today. She is taking aspirin and has quit smoking at this time.    Erlene Quan  Dione Plover MD [facility]

## 2017-01-07 NOTE — Op Note (Signed)
    Patient name: Faith [name] MRN: [id number] DOB: 06/28/1951 Sex: female  01/07/2017 Pre-operative Diagnosis: critical right lower extremity ischemia Post-operative diagnosis:  Same Surgeon:  Erlene Quan C. Donzetta Matters, MD Procedure Performed: 1.  US guided cannulation of left common femoral artery 2.  Aortogram with bilateral lower extremity runoff 3.  Second order cannulation of right lower extremity 4.  Moderate sedation with fentanyl and versed for 22 minutes  Indications:  66 year old female with history of right lower extremity ulceration. She expressed ABI is indicated for angiogram.  Findings: Aorta iliac segments appeared to be free of disease although they are diminutive in size. Right common femoral artery appears to be free of disease as well although the profunda and superficial femoral arteries are occluded at the takeoff and the leg is re-constituted via a medial circumflex branch. The above-knee popliteal artery reconstitutes.   Procedure:  The patient was identified in the holding area and taken to room 8.  The patient was then placed supine on the table and prepped and draped in the usual sterile fashion.  A time out was called.  Ultrasound was used to evaluate the left common femoral artery.  It was patent .  A digital ultrasound image was acquired.  A micropuncture needle was used to access the left common femoral artery under ultrasound guidance.  An 018 wire was advanced without resistance and a micropuncture sheath was placed.  The 018 wire was removed and a benson wire was placed.  The micropuncture sheath was exchanged for a 5 french sheath.  An omniflush catheter was advanced over the wire to the level of L-1.  An abdominal angiogram was obtained.  Next, using the omniflush catheter and a benson wire, the aortic bifurcation was crossed and the catheter was placed into theright external iliac artery and right runoff femoral angiogram was performed with the above findings. With this  patient will need consideration of bypass surgery.   Lainee Lehrman C. Donzetta Matters, MD [facility] Office: [phone] Pager: [phone]

## 2017-01-09 DIAGNOSIS — L89612 Pressure ulcer of right heel, stage 2: Secondary | ICD-10-CM | POA: Diagnosis not present

## 2017-01-13 ENCOUNTER — Encounter (HOSPITAL_COMMUNITY): Payer: Self-pay

## 2017-01-13 ENCOUNTER — Other Ambulatory Visit: Payer: Self-pay

## 2017-01-13 ENCOUNTER — Encounter (HOSPITAL_COMMUNITY)
Admission: RE | Admit: 2017-01-13 | Discharge: 2017-01-13 | Disposition: A | Discharge status: Home / Self Care | Payer: Medicaid Other | Source: Ambulatory Visit | Attending: Vascular Surgery | Admitting: Vascular Surgery

## 2017-01-13 DIAGNOSIS — Z87891 Personal history of nicotine dependence: Secondary | ICD-10-CM | POA: Insufficient documentation

## 2017-01-13 DIAGNOSIS — Z01818 Encounter for other preprocedural examination: Secondary | ICD-10-CM | POA: Diagnosis present

## 2017-01-13 DIAGNOSIS — I739 Peripheral vascular disease, unspecified: Secondary | ICD-10-CM | POA: Insufficient documentation

## 2017-01-13 HISTORY — DX: Dyspnea, unspecified: R06.00

## 2017-01-13 HISTORY — DX: Peripheral vascular disease, unspecified: I73.9

## 2017-01-13 LAB — ABO/RH: ABO/RH(D): O POS

## 2017-01-13 LAB — TYPE AND SCREEN
ABO/RH(D): O POS
Antibody Screen: NEGATIVE

## 2017-01-13 LAB — URINALYSIS, ROUTINE W REFLEX MICROSCOPIC
Bilirubin Urine: NEGATIVE
GLUCOSE, UA: NEGATIVE mg/dL
Hgb urine dipstick: NEGATIVE
KETONES UR: NEGATIVE mg/dL
LEUKOCYTES UA: NEGATIVE
Nitrite: NEGATIVE
PH: 6 (ref 5.0–8.0)
Protein, ur: NEGATIVE mg/dL
SPECIFIC GRAVITY, URINE: 1.009 (ref 1.005–1.030)

## 2017-01-13 LAB — COMPREHENSIVE METABOLIC PANEL
ALT: 14 U/L (ref 14–54)
AST: 14 U/L — ABNORMAL LOW (ref 15–41)
Albumin: 3.7 g/dL (ref 3.5–5.0)
Alkaline Phosphatase: 109 U/L (ref 38–126)
Anion gap: 9 (ref 5–15)
BILIRUBIN TOTAL: 0.4 mg/dL (ref 0.3–1.2)
BUN: 19 mg/dL (ref 6–20)
CHLORIDE: 114 mmol/L — AB (ref 101–111)
CO2: 18 mmol/L — ABNORMAL LOW (ref 22–32)
CREATININE: 0.81 mg/dL (ref 0.44–1.00)
Calcium: 9.8 mg/dL (ref 8.9–10.3)
Glucose, Bld: 84 mg/dL (ref 65–99)
Potassium: 2.5 mmol/L — CL (ref 3.5–5.1)
Sodium: 141 mmol/L (ref 135–145)
TOTAL PROTEIN: 7.1 g/dL (ref 6.5–8.1)

## 2017-01-13 LAB — CBC
HEMATOCRIT: 38.6 % (ref 36.0–46.0)
Hemoglobin: 13.4 g/dL (ref 12.0–15.0)
MCH: 28.5 pg (ref 26.0–34.0)
MCHC: 34.7 g/dL (ref 30.0–36.0)
MCV: 82.1 fL (ref 78.0–100.0)
PLATELETS: 218 10*3/uL (ref 150–400)
RBC: 4.7 MIL/uL (ref 3.87–5.11)
RDW: 14.7 % (ref 11.5–15.5)
WBC: 9.5 10*3/uL (ref 4.0–10.5)

## 2017-01-13 LAB — APTT: aPTT: 27 seconds (ref 24–36)

## 2017-01-13 LAB — SURGICAL PCR SCREEN
MRSA, PCR: NEGATIVE
STAPHYLOCOCCUS AUREUS: NEGATIVE

## 2017-01-13 LAB — PROTIME-INR
INR: 1.04
PROTHROMBIN TIME: 13.6 s (ref 11.4–15.2)

## 2017-01-13 NOTE — Progress Notes (Signed)
Call from [place] with [facility], she reports that MD has ordered pt. For K+ oral, they will call pt.  Cautioned the plan because the pt. did reveal today that she hasn't been taking the pills because they are so big & hard to swallow.

## 2017-01-13 NOTE — Progress Notes (Signed)
Lab reported that the pt.'s K+ is 2.5 today, call to A. Zelenak,PA-C, reported the same. Call to [facility] at [facility], reported  Same & also reported the K+ of 2.4 last week.  She will pass on to Dr. Donzetta Matters.

## 2017-01-13 NOTE — Progress Notes (Signed)
Pt. Followed by Dr. Lorra Hals for PCP, pt. Also seen at wound clinic & and a pain management clinic to get her oxycodone. Pt. Uses transportation through medicaid but also has a niece that can help with errands.  Pt. Denies any advanced cardiac surveillance. Pt. Denies chest concerns, has a cough which she states is normal for her, and reports is far improved ever since quitting smoking 2 weeks ago.

## 2017-01-13 NOTE — Pre-Procedure Instructions (Addendum)
Faith [name]  01/13/2017      [id number], [place] [address] Phone: [phone] Fax: [phone]    Your procedure is scheduled on Thursday February 15.  Report to [facility] Admitting at 5:30 A.M.  Call this number if you have problems the morning of surgery:  [phone]   Remember:  Do not eat food or drink liquids after midnight.  Take these medicines the morning of surgery with A SIP OF WATER: amlodipine (norvasc), estrogens (Premarin), gabapentin (neurontin), loratidine (Claritin), methocarbamol (robaxin) if needed, omeprazole (prilosec), oxycodone (percocet) if needed, albuterol if needed (bring inhaler to hospital with you)   7 days prior to surgery STOP taking any Aspirin, Aleve, Naproxen, Ibuprofen, Motrin, Advil, Goody's, BC's, all herbal medications, fish oil, and all vitamins    Do not wear jewelry, make-up or nail polish.  Do not wear lotions, powders, or perfumes, or deoderant.  Do not shave 48 hours prior to surgery.  Men may shave face and neck.  Do not bring valuables to the hospital.  [facility] is not responsible for any belongings or valuables.  Contacts, dentures or bridgework may not be worn into surgery.  Leave your suitcase in the car.  After surgery it may be brought to your room.  For patients admitted to the hospital, discharge time will be determined by your treatment team.  Patients discharged the day of surgery will not be allowed to drive home.   Special instructions:    [facility]- Preparing For Surgery  Before surgery, you can play an important role. Because skin is not sterile, your skin needs to be as free of germs as possible. You can reduce the number of germs on your skin by washing with CHG (chlorahexidine gluconate) Soap before surgery.  CHG is an antiseptic cleaner which kills germs and bonds with the skin to continue killing germs even  after washing.  Please do not use if you have an allergy to CHG or antibacterial soaps. If your skin becomes reddened/irritated stop using the CHG.  Do not shave (including legs and underarms) for at least 48 hours prior to first CHG shower. It is OK to shave your face.  Please follow these instructions carefully.   1. Shower the NIGHT BEFORE SURGERY and the MORNING OF SURGERY with CHG.   2. If you chose to wash your hair, wash your hair first as usual with your normal shampoo.  3. After you shampoo, rinse your hair and body thoroughly to remove the shampoo.  4. Use CHG as you would any other liquid soap. You can apply CHG directly to the skin and wash gently with a scrungie or a clean washcloth.   5. Apply the CHG Soap to your body ONLY FROM THE NECK DOWN.  Do not use on open wounds or open sores. Avoid contact with your eyes, ears, mouth and genitals (private parts). Wash genitals (private parts) with your normal soap.  6. Wash thoroughly, paying special attention to the area where your surgery will be performed.  7. Thoroughly rinse your body with warm water from the neck down.  8. DO NOT shower/wash with your normal soap after using and rinsing off the CHG Soap.  9. Pat yourself dry with a CLEAN TOWEL.   10. Wear CLEAN PAJAMAS   11. Place CLEAN SHEETS on your bed the night of your first shower and DO NOT SLEEP WITH PETS.  Day of Surgery: Do not apply any deodorants/lotions. Please wear clean clothes to the hospital/surgery center.      Please read over the following fact sheets that you were given. MRSA Information

## 2017-01-14 MED ORDER — VANCOMYCIN HCL 10 G IV SOLR
1250.0000 mg | INTRAVENOUS | Status: AC
  Administered 2017-01-15: 1250 mg via INTRAVENOUS
  Filled 2017-01-14: qty 1250

## 2017-01-14 NOTE — Progress Notes (Signed)
Anesthesia chart review: Patient is a 66 year old female scheduled for right femoral to popliteal artery bypass on 01/15/2017 by Dr. Donzetta Matters.  History includes recent former smoker (quit 12/03/16), PAD, HTN, GERD, anxiety, hypercholesterolemia, chronic bronchitis, chronic back pain, schizophrenia, bipolar disorder, dyspnea, cholecystectomy, appendectomy, hysterectomy  PCP is Dr. Jeanie Cooks   Meds include albuterol, amlodipine, Lotrel, aspirin 81 mg, Benadryl, Premarin, Flonase, Lasix, Neurontin, Linzess, Claritin, Robaxin, Movantik, Nicorette, Prilosec, Percocet, KCL (however, learned she had not been taking them due to large pill size and difficulty in swallowing).  BP (!) 123/53   Pulse 69   Temp 36.6 C   Resp 18   Ht '5\' 2"'$  (1.575 m)   Wt 139 lb 4.8 oz (63.2 kg)   SpO2 98%   BMI 25.48 kg/m   EKG 01/09/17: NSR, septal infarct (age undetermined). No significant change since 07/03/14 tracing.  Preoperative labs noted. K 2.5, Cr 0.81. CBC, PT/PTT WNL. Glucose 84. UA negative for leukocytes and nitrites. Per [facility] RN Colletta Maryland, patient will be started on KCL replacement in the liquid or soluable granule/powder form (since she is unable to swallow pill form).  She will get a STAT BMET on arrival to re-evaluate hypokalemia.   If follow-up lab results acceptable and otherwise no acute changes then I would anticipate that she could proceed as planned.  [name] Hugh [facility]/Anesthesiology Phone [phone] 01/14/2017 10:20 AM

## 2017-01-14 NOTE — Anesthesia Preprocedure Evaluation (Signed)
Anesthesia Evaluation  Patient identified by MRN, date of birth, ID band Patient awake    Reviewed: Allergy & Precautions, H&P , NPO status , Patient's Chart, lab work & pertinent test results  Airway Mallampati: I  TM Distance: >3 FB Neck ROM: Full    Dental  (+) Edentulous Upper, Dental Advisory Given   Pulmonary Current Smoker, former smoker,    breath sounds clear to auscultation       Cardiovascular hypertension, Pt. on medications + Peripheral Vascular Disease   Rhythm:Regular Rate:Normal     Neuro/Psych    GI/Hepatic GERD  Medicated and Controlled,  Endo/Other    Renal/GU      Musculoskeletal   Abdominal   Peds  Hematology   Anesthesia Other Findings   Reproductive/Obstetrics                             Anesthesia Physical  Anesthesia Plan  ASA: III  Anesthesia Plan: General   Post-op Pain Management:    Induction: Intravenous  Airway Management Planned: Oral ETT  Additional Equipment:   Intra-op Plan:   Post-operative Plan: Extubation in OR  Informed Consent: I have reviewed the patients History and Physical, chart, labs and discussed the procedure including the risks, benefits and alternatives for the proposed anesthesia with the patient or authorized representative who has indicated his/her understanding and acceptance.   Dental advisory given  Plan Discussed with: CRNA  Anesthesia Plan Comments:         Anesthesia Quick Evaluation

## 2017-01-15 ENCOUNTER — Inpatient Hospital Stay (HOSPITAL_COMMUNITY)
Admission: RE | Admit: 2017-01-15 | Discharge: 2017-01-19 | DRG: 253 | Disposition: A | Discharge status: Home Health | Payer: Medicaid Other | Source: Ambulatory Visit | Attending: Vascular Surgery | Admitting: Vascular Surgery

## 2017-01-15 ENCOUNTER — Inpatient Hospital Stay (HOSPITAL_COMMUNITY): Payer: Medicaid Other | Admitting: Anesthesiology

## 2017-01-15 ENCOUNTER — Ambulatory Visit: Payer: Medicaid Other

## 2017-01-15 ENCOUNTER — Inpatient Hospital Stay (HOSPITAL_COMMUNITY): Payer: Medicaid Other | Admitting: Vascular Surgery

## 2017-01-15 ENCOUNTER — Encounter (HOSPITAL_COMMUNITY): Payer: Self-pay | Admitting: Certified Registered Nurse Anesthetist

## 2017-01-15 ENCOUNTER — Encounter (HOSPITAL_COMMUNITY)
Admission: RE | Disposition: A | Discharge status: Home Health | Payer: Self-pay | Source: Ambulatory Visit | Attending: Vascular Surgery

## 2017-01-15 DIAGNOSIS — Z79899 Other long term (current) drug therapy: Secondary | ICD-10-CM

## 2017-01-15 DIAGNOSIS — Z79891 Long term (current) use of opiate analgesic: Secondary | ICD-10-CM

## 2017-01-15 DIAGNOSIS — M545 Low back pain: Secondary | ICD-10-CM | POA: Diagnosis present

## 2017-01-15 DIAGNOSIS — K219 Gastro-esophageal reflux disease without esophagitis: Secondary | ICD-10-CM | POA: Diagnosis present

## 2017-01-15 DIAGNOSIS — E876 Hypokalemia: Secondary | ICD-10-CM | POA: Diagnosis not present

## 2017-01-15 DIAGNOSIS — Z7982 Long term (current) use of aspirin: Secondary | ICD-10-CM

## 2017-01-15 DIAGNOSIS — I10 Essential (primary) hypertension: Secondary | ICD-10-CM | POA: Diagnosis present

## 2017-01-15 DIAGNOSIS — G8929 Other chronic pain: Secondary | ICD-10-CM | POA: Diagnosis present

## 2017-01-15 DIAGNOSIS — L97411 Non-pressure chronic ulcer of right heel and midfoot limited to breakdown of skin: Secondary | ICD-10-CM | POA: Diagnosis present

## 2017-01-15 DIAGNOSIS — D62 Acute posthemorrhagic anemia: Secondary | ICD-10-CM | POA: Diagnosis not present

## 2017-01-15 DIAGNOSIS — Z880 Allergy status to penicillin: Secondary | ICD-10-CM

## 2017-01-15 DIAGNOSIS — I739 Peripheral vascular disease, unspecified: Secondary | ICD-10-CM | POA: Diagnosis present

## 2017-01-15 DIAGNOSIS — I70234 Atherosclerosis of native arteries of right leg with ulceration of heel and midfoot: Principal | ICD-10-CM | POA: Diagnosis present

## 2017-01-15 DIAGNOSIS — Z87891 Personal history of nicotine dependence: Secondary | ICD-10-CM

## 2017-01-15 HISTORY — PX: FEMORAL-POPLITEAL BYPASS GRAFT: SHX937

## 2017-01-15 LAB — BASIC METABOLIC PANEL
ANION GAP: 5 (ref 5–15)
BUN: 16 mg/dL (ref 6–20)
CO2: 15 mmol/L — ABNORMAL LOW (ref 22–32)
Calcium: 9.8 mg/dL (ref 8.9–10.3)
Chloride: 122 mmol/L — ABNORMAL HIGH (ref 101–111)
Creatinine, Ser: 0.69 mg/dL (ref 0.44–1.00)
GFR calc Af Amer: >60 mL/min (ref >60)
Glucose, Bld: 95 mg/dL (ref 65–99)
POTASSIUM: 3.7 mmol/L (ref 3.5–5.1)
SODIUM: 142 mmol/L (ref 135–145)

## 2017-01-15 LAB — CREATININE, SERUM
Creatinine, Ser: 0.7 mg/dL (ref 0.44–1.00)
GFR calc Af Amer: >60 mL/min (ref >60)
GFR calc non Af Amer: >60 mL/min (ref >60)

## 2017-01-15 LAB — CBC
HCT: 30.7 % — ABNORMAL LOW (ref 36.0–46.0)
Hemoglobin: 10.4 g/dL — ABNORMAL LOW (ref 12.0–15.0)
MCH: 28.4 pg (ref 26.0–34.0)
MCHC: 33.9 g/dL (ref 30.0–36.0)
MCV: 83.9 fL (ref 78.0–100.0)
PLATELETS: 194 10*3/uL (ref 150–400)
RBC: 3.66 MIL/uL — ABNORMAL LOW (ref 3.87–5.11)
RDW: 14.9 % (ref 11.5–15.5)
WBC: 12.3 10*3/uL — ABNORMAL HIGH (ref 4.0–10.5)

## 2017-01-15 SURGERY — BYPASS GRAFT FEMORAL-POPLITEAL ARTERY
Anesthesia: General | Site: Leg Upper | Laterality: Right

## 2017-01-15 MED ORDER — PANTOPRAZOLE SODIUM 40 MG PO TBEC
40.0000 mg | DELAYED_RELEASE_TABLET | Freq: Every day | ORAL | Status: DC
  Administered 2017-01-15 – 2017-01-16 (×2): 40 mg via ORAL
  Filled 2017-01-15 (×2): qty 1

## 2017-01-15 MED ORDER — SODIUM CHLORIDE 0.9 % IV SOLN
INTRAVENOUS | Status: DC | PRN
  Administered 2017-01-15: 500 mL

## 2017-01-15 MED ORDER — PROTAMINE SULFATE 10 MG/ML IV SOLN
INTRAVENOUS | Status: DC | PRN
  Administered 2017-01-15: 25 mg via INTRAVENOUS

## 2017-01-15 MED ORDER — POTASSIUM CHLORIDE IN NACL 20-0.9 MEQ/L-% IV SOLN
INTRAVENOUS | Status: DC
  Administered 2017-01-15 – 2017-01-16 (×2): via INTRAVENOUS
  Filled 2017-01-15 (×2): qty 1000

## 2017-01-15 MED ORDER — PHENYLEPHRINE HCL 10 MG/ML IJ SOLN
INTRAMUSCULAR | Status: DC | PRN
  Administered 2017-01-15: 10 ug/min via INTRAVENOUS

## 2017-01-15 MED ORDER — MEPERIDINE HCL 25 MG/ML IJ SOLN: 6.2500 mg | INTRAMUSCULAR | Status: DC | PRN

## 2017-01-15 MED ORDER — ALUM & MAG HYDROXIDE-SIMETH 200-200-20 MG/5ML PO SUSP: 15.0000 mL | ORAL | Status: DC | PRN

## 2017-01-15 MED ORDER — MIDAZOLAM HCL 2 MG/2ML IJ SOLN
INTRAMUSCULAR | Status: AC
  Filled 2017-01-15: qty 2

## 2017-01-15 MED ORDER — ALBUTEROL SULFATE HFA 108 (90 BASE) MCG/ACT IN AERS: 1.0000 | INHALATION_SPRAY | Freq: Four times a day (QID) | RESPIRATORY_TRACT | Status: DC | PRN

## 2017-01-15 MED ORDER — 0.9 % SODIUM CHLORIDE (POUR BTL) OPTIME
TOPICAL | Status: DC | PRN
  Administered 2017-01-15: 2000 mL

## 2017-01-15 MED ORDER — HYDROMORPHONE HCL 1 MG/ML IJ SOLN
INTRAMUSCULAR | Status: AC
  Filled 2017-01-15: qty 0.5

## 2017-01-15 MED ORDER — CHLORHEXIDINE GLUCONATE CLOTH 2 % EX PADS: 6.0000 | MEDICATED_PAD | Freq: Once | CUTANEOUS | Status: DC

## 2017-01-15 MED ORDER — ALBUTEROL SULFATE (2.5 MG/3ML) 0.083% IN NEBU: 2.5000 mg | INHALATION_SOLUTION | Freq: Four times a day (QID) | RESPIRATORY_TRACT | Status: DC | PRN

## 2017-01-15 MED ORDER — MOMETASONE FUROATE 0.1 % EX CREA
1.0000 "application " | TOPICAL_CREAM | Freq: Every day | CUTANEOUS | Status: DC
  Filled 2017-01-15: qty 15

## 2017-01-15 MED ORDER — ASPIRIN EC 81 MG PO TBEC
81.0000 mg | DELAYED_RELEASE_TABLET | Freq: Every day | ORAL | Status: DC
  Administered 2017-01-15 – 2017-01-16 (×2): 81 mg via ORAL
  Filled 2017-01-15 (×2): qty 1

## 2017-01-15 MED ORDER — ALBUTEROL SULFATE HFA 108 (90 BASE) MCG/ACT IN AERS
INHALATION_SPRAY | RESPIRATORY_TRACT | Status: DC | PRN
  Administered 2017-01-15 (×2): 4 via RESPIRATORY_TRACT

## 2017-01-15 MED ORDER — LINACLOTIDE 145 MCG PO CAPS
290.0000 ug | ORAL_CAPSULE | Freq: Every day | ORAL | Status: DC
  Administered 2017-01-15 – 2017-01-16 (×2): 290 ug via ORAL
  Filled 2017-01-15 (×2): qty 1

## 2017-01-15 MED ORDER — PROPOFOL 10 MG/ML IV BOLUS
INTRAVENOUS | Status: DC | PRN
  Administered 2017-01-15: 100 mg via INTRAVENOUS

## 2017-01-15 MED ORDER — FENTANYL CITRATE (PF) 250 MCG/5ML IJ SOLN
INTRAMUSCULAR | Status: AC
  Filled 2017-01-15: qty 5

## 2017-01-15 MED ORDER — AMLODIPINE BESYLATE 5 MG PO TABS
5.0000 mg | ORAL_TABLET | Freq: Every day | ORAL | Status: DC
  Administered 2017-01-15 – 2017-01-16 (×2): 5 mg via ORAL
  Filled 2017-01-15 (×2): qty 1

## 2017-01-15 MED ORDER — HYDRALAZINE HCL 20 MG/ML IJ SOLN: 5.0000 mg | INTRAMUSCULAR | Status: DC | PRN

## 2017-01-15 MED ORDER — POTASSIUM CHLORIDE CRYS ER 20 MEQ PO TBCR
20.0000 meq | EXTENDED_RELEASE_TABLET | Freq: Once | ORAL | Status: AC
  Administered 2017-01-15: 20 meq via ORAL
  Filled 2017-01-15: qty 1

## 2017-01-15 MED ORDER — PHENOL 1.4 % MT LIQD
1.0000 | OROMUCOSAL | Status: DC | PRN
Start: 2017-01-15 — End: 2017-01-16

## 2017-01-15 MED ORDER — POLYETHYLENE GLYCOL 3350 17 G PO PACK: 17.0000 g | PACK | Freq: Every day | ORAL | Status: DC | PRN

## 2017-01-15 MED ORDER — PROMETHAZINE HCL 25 MG/ML IJ SOLN: 6.2500 mg | INTRAMUSCULAR | Status: DC | PRN

## 2017-01-15 MED ORDER — ESMOLOL HCL 100 MG/10ML IV SOLN
INTRAVENOUS | Status: DC | PRN
  Administered 2017-01-15: 30 mg via INTRAVENOUS

## 2017-01-15 MED ORDER — LACTATED RINGERS IV SOLN
INTRAVENOUS | Status: DC | PRN
  Administered 2017-01-15 (×3): via INTRAVENOUS

## 2017-01-15 MED ORDER — MORPHINE SULFATE (PF) 4 MG/ML IV SOLN
INTRAVENOUS | Status: AC
  Filled 2017-01-15: qty 1

## 2017-01-15 MED ORDER — PROPOFOL 10 MG/ML IV BOLUS
INTRAVENOUS | Status: AC
  Filled 2017-01-15: qty 20

## 2017-01-15 MED ORDER — HEPARIN SODIUM (PORCINE) 5000 UNIT/ML IJ SOLN
5000.0000 [IU] | Freq: Three times a day (TID) | INTRAMUSCULAR | Status: DC
  Administered 2017-01-15 – 2017-01-16 (×3): 5000 [IU] via SUBCUTANEOUS
  Filled 2017-01-15 (×3): qty 1

## 2017-01-15 MED ORDER — GABAPENTIN 300 MG PO CAPS
300.0000 mg | ORAL_CAPSULE | Freq: Two times a day (BID) | ORAL | Status: DC
  Administered 2017-01-15 – 2017-01-16 (×2): 300 mg via ORAL
  Filled 2017-01-15 (×2): qty 1

## 2017-01-15 MED ORDER — OXYCODONE-ACETAMINOPHEN 5-325 MG PO TABS
1.0000 | ORAL_TABLET | ORAL | Status: DC | PRN
  Administered 2017-01-15 – 2017-01-16 (×3): 2 via ORAL
  Filled 2017-01-15 (×3): qty 2

## 2017-01-15 MED ORDER — HYDROMORPHONE HCL 1 MG/ML IJ SOLN
0.2500 mg | INTRAMUSCULAR | Status: DC | PRN
  Administered 2017-01-15: 0.5 mg via INTRAVENOUS

## 2017-01-15 MED ORDER — ONDANSETRON HCL 4 MG/2ML IJ SOLN
INTRAMUSCULAR | Status: DC | PRN
Start: 2017-01-15 — End: 2017-01-15
  Administered 2017-01-15: 4 mg via INTRAVENOUS

## 2017-01-15 MED ORDER — HEMOSTATIC AGENTS (NO CHARGE) OPTIME
TOPICAL | Status: DC | PRN
  Administered 2017-01-15: 3 via TOPICAL

## 2017-01-15 MED ORDER — METOPROLOL TARTRATE 5 MG/5ML IV SOLN: 2.0000 mg | INTRAVENOUS | Status: DC | PRN

## 2017-01-15 MED ORDER — ROCURONIUM BROMIDE 100 MG/10ML IV SOLN
INTRAVENOUS | Status: DC | PRN
  Administered 2017-01-15: 50 mg via INTRAVENOUS

## 2017-01-15 MED ORDER — HYDRALAZINE HCL 20 MG/ML IJ SOLN
INTRAMUSCULAR | Status: DC | PRN
  Administered 2017-01-15 (×2): 5 mg via INTRAVENOUS

## 2017-01-15 MED ORDER — ERYTHROMYCIN 5 MG/GM OP OINT
TOPICAL_OINTMENT | Freq: Every day | OPHTHALMIC | Status: DC
  Administered 2017-01-15: 23:00:00 via OPHTHALMIC
  Filled 2017-01-15: qty 3.5

## 2017-01-15 MED ORDER — PANTOPRAZOLE SODIUM 40 MG PO TBEC: 40.0000 mg | DELAYED_RELEASE_TABLET | Freq: Every day | ORAL | Status: DC

## 2017-01-15 MED ORDER — MIDAZOLAM HCL 5 MG/5ML IJ SOLN
INTRAMUSCULAR | Status: DC | PRN
  Administered 2017-01-15: 2 mg via INTRAVENOUS

## 2017-01-15 MED ORDER — SODIUM CHLORIDE 0.9 % IV SOLN: INTRAVENOUS | Status: DC

## 2017-01-15 MED ORDER — LABETALOL HCL 5 MG/ML IV SOLN: 10.0000 mg | INTRAVENOUS | Status: DC | PRN

## 2017-01-15 MED ORDER — DEXAMETHASONE SODIUM PHOSPHATE 4 MG/ML IJ SOLN
INTRAMUSCULAR | Status: DC | PRN
  Administered 2017-01-15: 8 mg via INTRAVENOUS

## 2017-01-15 MED ORDER — MORPHINE SULFATE (PF) 2 MG/ML IV SOLN
2.0000 mg | INTRAVENOUS | Status: DC | PRN
  Administered 2017-01-15: 2 mg via INTRAVENOUS
  Administered 2017-01-15 (×2): 4 mg via INTRAVENOUS
  Filled 2017-01-15: qty 2
  Filled 2017-01-15: qty 1

## 2017-01-15 MED ORDER — LIDOCAINE HCL (CARDIAC) 20 MG/ML IV SOLN
INTRAVENOUS | Status: DC | PRN
  Administered 2017-01-15: 100 mg via INTRAVENOUS

## 2017-01-15 MED ORDER — ONDANSETRON HCL 4 MG/2ML IJ SOLN: 4.0000 mg | Freq: Four times a day (QID) | INTRAMUSCULAR | Status: DC | PRN

## 2017-01-15 MED ORDER — FENTANYL CITRATE (PF) 100 MCG/2ML IJ SOLN
INTRAMUSCULAR | Status: DC | PRN
  Administered 2017-01-15 (×2): 50 ug via INTRAVENOUS
  Administered 2017-01-15: 100 ug via INTRAVENOUS
  Administered 2017-01-15 (×4): 50 ug via INTRAVENOUS

## 2017-01-15 MED ORDER — GUAIFENESIN-DM 100-10 MG/5ML PO SYRP: 15.0000 mL | ORAL_SOLUTION | ORAL | Status: DC | PRN

## 2017-01-15 MED ORDER — SUGAMMADEX SODIUM 200 MG/2ML IV SOLN
INTRAVENOUS | Status: DC | PRN
  Administered 2017-01-15: 150 mg via INTRAVENOUS

## 2017-01-15 MED ORDER — HEPARIN SODIUM (PORCINE) 1000 UNIT/ML IJ SOLN
INTRAMUSCULAR | Status: DC | PRN
  Administered 2017-01-15: 5 mL via INTRAVENOUS

## 2017-01-15 SURGICAL SUPPLY — 59 items
ADH SKN CLS APL DERMABOND .7 (GAUZE/BANDAGES/DRESSINGS) ×2
BANDAGE ESMARK 6X9 LF (GAUZE/BANDAGES/DRESSINGS) IMPLANT
BNDG CMPR 9X6 STRL LF SNTH (GAUZE/BANDAGES/DRESSINGS)
BNDG ESMARK 6X9 LF (GAUZE/BANDAGES/DRESSINGS)
CANISTER SUCTION 2500CC (MISCELLANEOUS) ×3 IMPLANT
CANNULA VESSEL 3MM 2 BLNT TIP (CANNULA) IMPLANT
CLIP TI MEDIUM 24 (CLIP) ×3 IMPLANT
CLIP TI WIDE RED SMALL 24 (CLIP) ×3 IMPLANT
CUFF TOURNIQUET SINGLE 24IN (TOURNIQUET CUFF) IMPLANT
CUFF TOURNIQUET SINGLE 34IN LL (TOURNIQUET CUFF) IMPLANT
CUFF TOURNIQUET SINGLE 44IN (TOURNIQUET CUFF) IMPLANT
DERMABOND ADVANCED (GAUZE/BANDAGES/DRESSINGS) ×4
DERMABOND ADVANCED .7 DNX12 (GAUZE/BANDAGES/DRESSINGS) ×1 IMPLANT
DRAIN CHANNEL 15F RND FF W/TCR (WOUND CARE) IMPLANT
DRAPE PROXIMA HALF (DRAPES) IMPLANT
DRAPE X-RAY CASS 24X20 (DRAPES) IMPLANT
ELECT REM PT RETURN 9FT ADLT (ELECTROSURGICAL) ×3
ELECTRODE REM PT RTRN 9FT ADLT (ELECTROSURGICAL) ×1 IMPLANT
EVACUATOR SILICONE 100CC (DRAIN) IMPLANT
GLOVE BIO SURGEON STRL SZ7.5 (GLOVE) ×3 IMPLANT
GLOVE BIOGEL PI IND STRL 6.5 (GLOVE) IMPLANT
GLOVE BIOGEL PI INDICATOR 6.5 (GLOVE) ×10
GLOVE SURG SS PI 6.0 STRL IVOR (GLOVE) ×4 IMPLANT
GOWN STRL NON-REIN LRG LVL3 (GOWN DISPOSABLE) ×2 IMPLANT
GOWN STRL REUS W/ TWL LRG LVL3 (GOWN DISPOSABLE) ×2 IMPLANT
GOWN STRL REUS W/ TWL XL LVL3 (GOWN DISPOSABLE) ×1 IMPLANT
GOWN STRL REUS W/TWL LRG LVL3 (GOWN DISPOSABLE) ×9
GOWN STRL REUS W/TWL XL LVL3 (GOWN DISPOSABLE) ×6
GRAFT PROPATEN W/RING 6X80X60 (Vascular Products) ×2 IMPLANT
HEMOSTAT SNOW SURGICEL 2X4 (HEMOSTASIS) ×2 IMPLANT
INSERT FOGARTY SM (MISCELLANEOUS) ×2 IMPLANT
KIT BASIN OR (CUSTOM PROCEDURE TRAY) ×3 IMPLANT
KIT ROOM TURNOVER OR (KITS) ×3 IMPLANT
MARKER GRAFT CORONARY BYPASS (MISCELLANEOUS) IMPLANT
NS IRRIG 1000ML POUR BTL (IV SOLUTION) ×6 IMPLANT
PACK PERIPHERAL VASCULAR (CUSTOM PROCEDURE TRAY) ×3 IMPLANT
PAD ARMBOARD 7.5X6 YLW CONV (MISCELLANEOUS) ×6 IMPLANT
SET COLLECT BLD 21X3/4 12 (NEEDLE) IMPLANT
SPONGE LAP 18X18 X RAY DECT (DISPOSABLE) ×2 IMPLANT
STOPCOCK 4 WAY LG BORE MALE ST (IV SETS) IMPLANT
SUT ETHILON 3 0 PS 1 (SUTURE) IMPLANT
SUT GORETEX 6.0 TT13 (SUTURE) IMPLANT
SUT GORETEX 6.0 TT9 (SUTURE) IMPLANT
SUT MNCRL AB 4-0 PS2 18 (SUTURE) ×6 IMPLANT
SUT PROLENE 5 0 C 1 24 (SUTURE) ×5 IMPLANT
SUT PROLENE 6 0 BV (SUTURE) ×5 IMPLANT
SUT PROLENE 7 0 BV 1 (SUTURE) IMPLANT
SUT SILK 2 0 SH (SUTURE) ×3 IMPLANT
SUT SILK 3 0 (SUTURE)
SUT SILK 3-0 18XBRD TIE 12 (SUTURE) IMPLANT
SUT VIC AB 2-0 CT1 27 (SUTURE) ×6
SUT VIC AB 2-0 CT1 TAPERPNT 27 (SUTURE) ×2 IMPLANT
SUT VIC AB 3-0 SH 27 (SUTURE) ×6
SUT VIC AB 3-0 SH 27X BRD (SUTURE) ×2 IMPLANT
TAPE UMBILICAL COTTON 1/8X30 (MISCELLANEOUS) IMPLANT
TRAY FOLEY W/METER SILVER 16FR (SET/KITS/TRAYS/PACK) ×3 IMPLANT
TUBING EXTENTION W/L.L. (IV SETS) IMPLANT
UNDERPAD 30X30 (UNDERPADS AND DIAPERS) ×3 IMPLANT
WATER STERILE IRR 1000ML POUR (IV SOLUTION) ×3 IMPLANT

## 2017-01-15 NOTE — Op Note (Signed)
Patient name: Faith [name] MRN: [id number] DOB: 08/13/1951 Sex: female  01/15/2017 Pre-operative Diagnosis: right lower extremity critical limb ischemia with ulceration Post-operative diagnosis:  Same Surgeon:  Faith Quan C. Donzetta Matters, MD Assistant: [email or web address] Procedure Performed:  1. right femoral to above knee popliteal artery bypass with 84m ringed propatent graft 2. Common femoral endarterectomy  Indications:  66year old female with right lower extremity rest pain and tissue ulceration on her heel and dorsum of her foot. She has an ABI that is 0.58 but with monophasic waveforms and likely falsely elevated. Angiogram demonstrated flush occlusion of her superficial femoral artery on the right reconstitution of the above-knee popliteal artery.  Findings: She had aberrant anatomy in the right groin with an early branching common femoral artery to a medially placed profunda femoris overlying the common femoral vein. The SFA was occluded at its origin. There was disease in the common femoral into the profunda femoris artery that was removed with endarterectomy. I completion of graft she had a palpable anterior tibial pulse at the ankle.   Procedure:  The patient was identified in the holding area and taken to the operating room where she is placed supine on the operating table general anesthesia was induced she was given antibiotics and sterilely prepped and draped in the usual fashion. Timeout was called. We began with vertical incision in the right groin dissected down onto the inguinal ligament. We identified an early branching of the common femoral artery up under the inguinal ligament and part of this was divided for exposure. We then divided the overlying vein. We placed vessel loops around the common femoral up under the inguinal ligament as well as the profunda which was medially placed overlying the common femoral vein and the SFA and 2 smaller branches. We then turned our attention distally. An incision  was made over the medial thigh just above the knee we dissected down to the level of the fascia divided this. The torus was retracted posteriorly and adductor anteriorly. We then dissected through the popliteal fat pad to identify the popliteal vein and artery. I dopplered the artery and it did have a monophasic signal. I dissected out placed vessel loops around it. A tunneler was then tunneled between the 2 incisions and a subsartorial plane. A 6 mm graft was then introduced tunneler removed. Patient was heparinized at this point. The graft was trimmed to size and the groin all vessels were clamped we opened longitudinally from the common femoral onto the profunda medially. There was significant plaque at the takeoff of the profunda and the common femoral that was nearly occlusive of the profunda femoris artery. We did performed Limited endarterectomy there until we had smooth edges. We also did an eversion endarterectomy of the superficial femoral artery and establish some back bleeding. This was then reclamped. The graft was then sewn end-to-side with 5-0 Prolene suture and upon releasing the clamps we had pulsatility within the graft. We then turned our attention distally. The graft was flushed with heparinized saline and clamped at its proximal take off. He was trimmed to size distally and sewn end to side after opening the popliteal artery longitudinally to popliteal artery with 5-0 Prolene suture. Prior to releasing our clamps we allowed flushing maneuvers both forward and backward through the native vessel as well as the graft. Upon releasing clamps we had a multiphasic signal at the level of the ankle in both the PT and AT that augmented with compression of the graft. With this we administered 25  mg of protamine and obtained hemostasis in both of our wounds. The wounds were then closed with 2-0 Vicryl 3-0 Vicryl for Monocryl and Dermabond at the skin. A completion we did have a palpable anterior tibial  pulse at the level of the ankle. Patient is laterally from anesthesia having tolerated the procedure well without immediate complication.   Blood loss 400 mL.  Faith Sabel C. Donzetta Matters, MD [facility] [place] Office: [phone] Pager: [phone]

## 2017-01-15 NOTE — Transfer of Care (Signed)
Immediate Anesthesia Transfer of Care Note  Patient: Faith [name]  Procedure(s) Performed: Procedure(s): BYPASS GRAFT FEMORAL-POPLITEAL ARTERY RIGHT LEG (Right)  Patient Location: PACU  Anesthesia Type:General  Level of Consciousness: awake, alert , oriented and patient cooperative  Airway & Oxygen Therapy: Patient Spontanous Breathing and Patient connected to nasal cannula oxygen  Post-op Assessment: Report given to RN and Post -op Vital signs reviewed and stable  Post vital signs: Reviewed and stable  Last Vitals:  Vitals:   01/15/17 0609 01/15/17 1040  BP: (!) 101/44 (!) 180/66  Pulse: (!) 59 94  Resp: 16 20  Temp: 36.9 C 36.4 C    Last Pain:  Vitals:   01/15/17 0648  TempSrc:   PainSc: 6       Patients Stated Pain Goal: 2 (40/34/74 2595)  Complications: No apparent anesthesia complications   Post-op HTN treated with 5 additional mg of hydralazine.   50 mcg fentanyl given for pain rated 5/10 in right leg (incision).  Denies SOB or any other discomforts at this time.

## 2017-01-15 NOTE — Progress Notes (Signed)
  Day of Surgery Note    Subjective:  No complaints  Vitals:   01/15/17 1425 01/15/17 1525  BP: (!) 111/50   Pulse: 79 75  Resp: 14 11  Temp:      Incisions:   Incisions are clean and dry Extremities:  Easily palpable right DP pulse Cardiac:  regular Lungs:  Non labored   Assessment/Plan:  This is a 66 y.o. female who is s/p  1. right femoral to above knee popliteal artery bypass with 35m ringed propatent graft 2. Common femoral endarterectomy  -pt doing well in pacu with easily palpable right DP pulse. -tuck dry gauze in right groin to wick moisture -transfer to 4 east when bed available   SLeontine Locket PA-C 01/15/2017 3:34 PM [phone]

## 2017-01-15 NOTE — H&P (Signed)
HP     History of Present Illness:   Faith [name] a 66 y.o.femalewith history of hypertension and chronic every day smoker presents with right dorsal foot and overlying achillesulcerations. She states that these have been present since August she didn't valuated the wound clinic since that time. She has never had wounds before. She does have limitations to her walking which is cramping of her right calf which has been present for several years. She is never had wounds like this before. She denies fevers chills or any infection of the right foot. She is religious about attending the wound care center on Fridays. She does have a chronic cough as well secondary to her smoking. States her blood pressure is well-controlled she is nondiabetic. She states that her cholesterol is also well controlled she does not take statin or aspirin and has been taking over-the-counter ibuprofen and pain in her wound sites.   Past Medical History:  Diagnosis Date  . Anxiety   . Arthritis    "thighs; legs; hips" (07/05/2014)  . Bipolar disorder ([place])   . Cholelithiasis 07/2014  . Chronic bronchitis ([place])    "get it q yr"  . Chronic lower back pain   . Depression    pt. use to go to depression clinic, states she still has depression & anxiety but can't get to appt. so she hasn't had any med. for it in a while   . Dyspnea   . GERD (gastroesophageal reflux disease)   . High cholesterol   . Hypertension   . Peripheral vascular disease ([place])   . Schizophrenia [facility])     Past Surgical History:  Procedure Laterality Date  . ABDOMINAL AORTOGRAM N/A 01/07/2017   Procedure: Abdominal Aortogram;  Surgeon: Waynetta Sandy, MD;  Location: [facility];  Service: Cardiovascular;  Laterality: N/A;  . ABDOMINAL HYSTERECTOMY    . ANKLE FRACTURE SURGERY Right   . ANKLE HARDWARE REMOVAL Right   . APPENDECTOMY  ~ 1963  . BREAST BIOPSY Bilateral   . BREAST CYST EXCISION Bilateral    "not cancer"  .  CATARACT EXTRACTION W/ INTRAOCULAR LENS  IMPLANT, BILATERAL    . CHOLECYSTECTOMY N/A 07/05/2014   Procedure: LAPAROSCOPIC CHOLECYSTECTOMY WITH INTRAOPERATIVE CHOLANGIOGRAM;  Surgeon: Gayland Curry, MD;  Location: [place];  Service: General;  Laterality: N/A;  . EXCISIONAL HEMORRHOIDECTOMY    . LAPAROSCOPIC CHOLECYSTECTOMY  07/05/2014  . LOWER EXTREMITY ANGIOGRAPHY Bilateral 01/07/2017   Procedure: Lower Extremity Angiography;  Surgeon: Waynetta Sandy, MD;  Location: [place] [facility];  Service: Cardiovascular;  Laterality: Bilateral;  . MULTIPLE TOOTH EXTRACTIONS  05/2013   "pulled my upper teeth"  . PILONIDAL CYST EXCISION      Allergies  Allergen Reactions  . Penicillins Itching and Other (See Comments)    REACTION: Yeast infection  Has patient had a PCN  reaction causing immediate rash, facial/tongue/throat swelling, SOB or lightheadedness with hypotension: {no Has patient had a PCN reaction causing severe rash involving mucus membranes or skin necrosis: {no Has patient had a PCN reaction that required hospitalization no Has patient had a PCN reaction occurring within the last 10 years: {no If all of the above answers are "NO", then may proceed with Cephalosporin use.    Prior to Admission medications   Medication Sig Start Date End Date Taking? Authorizing Provider  albuterol (PROVENTIL HFA;VENTOLIN HFA) 108 (90 Base) MCG/ACT inhaler Inhale 1-2 puffs into the lungs every 6 (six) hours as needed for wheezing or shortness  of breath.   Yes Historical Provider, MD  albuterol (PROVENTIL) (2.5 MG/3ML) 0.083% nebulizer solution Take 2.5 mg by nebulization every 6 (six) hours as needed for wheezing or shortness of breath.   Yes Historical Provider, MD  amLODipine (NORVASC) 5 MG tablet Take 5 mg by mouth daily.   Yes Historical Provider, MD  amLODipine-benazepril (LOTREL) 5-10 MG per capsule Take 1 capsule by mouth daily.   Yes Historical Provider, MD  aspirin EC 81 MG tablet Take 81 mg by  mouth daily.   Yes Historical Provider, MD  diclofenac (VOLTAREN) 75 MG EC tablet Take 75 mg by mouth 2 (two) times daily.   Yes Historical Provider, MD  diclofenac sodium (VOLTAREN) 1 % GEL Apply 2 g topically 4 (four) times daily.   Yes Historical Provider, MD  diphenhydrAMINE (BENADRYL) 25 MG tablet Take 2 tablets (50 mg total) by mouth at bedtime as needed for sleep. 09/11/15  Yes Evelina Bucy, MD  fluticasone [facility]) 50 MCG/ACT nasal spray Place 2 sprays into both nostrils daily as needed for allergies.  09/10/15  Yes Historical Provider, MD  furosemide (LASIX) 20 MG tablet Take 20 mg by mouth daily as needed.    Yes Historical Provider, MD  gabapentin (NEURONTIN) 300 MG capsule Take 300 mg by mouth 2 (two) times daily.    Yes Historical Provider, MD  ibuprofen (ADVIL,MOTRIN) 800 MG tablet Take 800 mg by mouth 3 (three) times daily.   Yes Historical Provider, MD  Linaclotide (LINZESS) 290 MCG CAPS capsule Take 290 mcg by mouth daily.   Yes Historical Provider, MD  loratadine (CLARITIN) 10 MG tablet Take 10 mg by mouth daily.   Yes Historical Provider, MD  methocarbamol (ROBAXIN) 750 MG tablet Take 750 mg by mouth 2 (two) times daily as needed for muscle spasms.   Yes Historical Provider, MD  mometasone (ELOCON) 0.1 % ointment Apply 1 application topically daily.   Yes Historical Provider, MD  nicotine polacrilex (NICORETTE) 4 MG gum Take 4 mg by mouth as needed for smoking cessation.   Yes Historical Provider, MD  omeprazole (PRILOSEC) 20 MG capsule Take 20 mg by mouth 2 (two) times daily before a meal.   Yes Historical Provider, MD  oxyCODONE-acetaminophen (PERCOCET) 10-325 MG tablet Take 1 tablet by mouth 4 (four) times daily. 12/08/16  Yes Historical Provider, MD  potassium chloride SA (K-DUR,KLOR-CON) 20 MEQ tablet Take 1 tablet (20 mEq total) by mouth daily. 02/26/14  Yes Orlie Dakin, MD  EPINEPHrine (EPIPEN IJ) Inject 1 each as directed once as needed (allergic reaction).     Historical  Provider, MD  erythromycin ophthalmic ointment Place a 1/2 inch ribbon of ointment into the lower eyelid of both eyes three times daily. Patient not taking: Reported on 01/07/2017 12/23/15   Olivia Canter Sam, PA-C  estrogens, conjugated, (PREMARIN) 0.625 MG tablet Take 0.625 mg by mouth daily. Take daily for 21 days then do not take for 7 days.    Historical Provider, MD  MOVANTIK 25 MG TABS tablet Take 25 mg by mouth daily. 12/09/16   Historical Provider, MD  nicotine polacrilex (NICORETTE) 4 MG gum Take 4 mg by mouth as needed for smoking cessation.    Historical Provider, MD    Social History   Social History  . Marital status: Single    Spouse name: N/A  . Number of children: N/A  . Years of education: N/A   Occupational History  . Not on file.   Social History Main Topics  .  Smoking status: Former Smoker    Packs/day: 1.50    Years: 48.00    Types: Cigarettes    Quit date: 12/31/2016  . Smokeless tobacco: Current User    Types: Chew  . Alcohol use 1.2 oz/week    2 Cans of beer per week     Comment:  only on the weekend  . Drug use: No     Comment: 15-20 yrs. ago- used marijuana   . Sexual activity: Yes    Birth control/ protection: Post-menopausal   Other Topics Concern  . Not on file   Social History Narrative  . No narrative on file     Family History  Problem Relation Age of Onset  . Cancer Mother 78    colon  . Diabetes Mother   . Hypertension Mother   . Cancer Father   . Diabetes Father   . Hypertension Father       Physical Examination  Vitals:   01/15/17 0609  BP: (!) 101/44  Pulse: (!) 59  Resp: 16  Temp: 98.4 F (36.9 C)   There is no height or weight on file to calculate BMI.  Physical Exam Constitutional: She is oriented to person, place, and time. She appears well-developed.  HENT:  Head: Atraumatic.  Eyes: EOMare normal.  Neck: Normal range of motion.  Cardiovascular: Normal rate.  Bilateral 2+ femoral pulses Monophasic R dp/pt,  non-palpable popliteal on right Left sided biphasic pt/dp, 1+ palpable popliteal on left  Pulmonary/Chest: Effort normal.  Abdominal: Soft. There is tenderness.  Musculoskeletal: She exhibits no edema.  1cm ulceration to dorsum right foot and right heel without erythema Lymphadenopathy:  She has no cervical adenopathy.  Neurological: She is alertand oriented to person, place, and time.  Skin: Skin is warmand dry.  Psychiatric: She has a normal mood and affect. Her behavior is normal. Thought contentnormal.    CBC    Component Value Date/Time   WBC 9.5 01/13/2017 1214   RBC 4.70 01/13/2017 1214   HGB 13.4 01/13/2017 1214   HCT 38.6 01/13/2017 1214   PLT 218 01/13/2017 1214   MCV 82.1 01/13/2017 1214   MCH 28.5 01/13/2017 1214   MCHC 34.7 01/13/2017 1214   RDW 14.7 01/13/2017 1214   LYMPHSABS 4.1 (H) 07/03/2014 1528   MONOABS 0.7 07/03/2014 1528   EOSABS 0.0 07/03/2014 1528   BASOSABS 0.0 07/03/2014 1528    BMET    Component Value Date/Time   NA 142 01/15/2017 0620   K 3.7 01/15/2017 0620   CL 122 (H) 01/15/2017 0620   CO2 15 (L) 01/15/2017 0620   GLUCOSE 95 01/15/2017 0620   BUN 16 01/15/2017 0620   CREATININE 0.69 01/15/2017 0620   CALCIUM 9.8 01/15/2017 0620   GFRNONAA >60 01/15/2017 0620   GFRAA >60 01/15/2017 0620    COAGS: Lab Results  Component Value Date   INR 1.04 01/13/2017     Non-Invasive Vascular Imaging:     ASSESSMENT/PLAN: This is a 66 y.o. female with occluded sfa at takeoff, reconstitutes distal sfa/proximal popliteal artery on right. Plan for R fem-pop bypass. Patient niece is here and would make decisions in case of patient not able to decide for self.  Risks and benefits have been discussed.    Myka Lukins C. Donzetta Matters, MD [facility] [place] Office: [phone] Pager: [phone]

## 2017-01-15 NOTE — Anesthesia Procedure Notes (Signed)
Procedure Name: Intubation Date/Time: 01/15/2017 7:39 AM Performed by: Oletta Lamas Pre-anesthesia Checklist: Patient identified, Emergency Drugs available, Suction available and Patient being monitored Patient Re-evaluated:Patient Re-evaluated prior to inductionOxygen Delivery Method: Circle System Utilized Preoxygenation: Pre-oxygenation with 100% oxygen Intubation Type: IV induction Ventilation: Mask ventilation without difficulty Laryngoscope Size: Miller and 2 Grade View: Grade I Tube type: Oral Tube size: 7.5 mm Number of attempts: 1 Airway Equipment and Method: Stylet Placement Confirmation: ETT inserted through vocal cords under direct vision,  positive ETCO2 and breath sounds checked- equal and bilateral Secured at: 22 cm Tube secured with: Tape Dental Injury: Teeth and Oropharynx as per pre-operative assessment

## 2017-01-16 ENCOUNTER — Encounter (HOSPITAL_COMMUNITY): Payer: Self-pay | Admitting: Vascular Surgery

## 2017-01-16 ENCOUNTER — Telehealth: Payer: Self-pay | Admitting: Vascular Surgery

## 2017-01-16 LAB — BASIC METABOLIC PANEL
Anion gap: 5 (ref 5–15)
BUN: 10 mg/dL (ref 6–20)
CO2: 16 mmol/L — ABNORMAL LOW (ref 22–32)
CREATININE: 0.66 mg/dL (ref 0.44–1.00)
Calcium: 8.9 mg/dL (ref 8.9–10.3)
Chloride: 121 mmol/L — ABNORMAL HIGH (ref 101–111)
GFR calc Af Amer: >60 mL/min (ref >60)
GFR calc non Af Amer: >60 mL/min (ref >60)
GLUCOSE: 92 mg/dL (ref 65–99)
POTASSIUM: 3.4 mmol/L — AB (ref 3.5–5.1)
SODIUM: 142 mmol/L (ref 135–145)

## 2017-01-16 LAB — CBC
HCT: 25.5 % — ABNORMAL LOW (ref 36.0–46.0)
Hemoglobin: 8.7 g/dL — ABNORMAL LOW (ref 12.0–15.0)
MCH: 28.2 pg (ref 26.0–34.0)
MCHC: 34.1 g/dL (ref 30.0–36.0)
MCV: 82.8 fL (ref 78.0–100.0)
PLATELETS: 166 10*3/uL (ref 150–400)
RBC: 3.08 MIL/uL — AB (ref 3.87–5.11)
RDW: 14.8 % (ref 11.5–15.5)
WBC: 10.3 10*3/uL (ref 4.0–10.5)

## 2017-01-16 LAB — HIV ANTIBODY (ROUTINE TESTING W REFLEX): HIV Screen 4th Generation wRfx: NONREACTIVE

## 2017-01-16 MED ORDER — ERYTHROMYCIN 5 MG/GM OP OINT
TOPICAL_OINTMENT | Freq: Every day | OPHTHALMIC | Status: DC
  Administered 2017-01-16 – 2017-01-18 (×3): via OPHTHALMIC
  Filled 2017-01-16 (×2): qty 3.5

## 2017-01-16 MED ORDER — GABAPENTIN 300 MG PO CAPS
300.0000 mg | ORAL_CAPSULE | Freq: Two times a day (BID) | ORAL | Status: DC
  Administered 2017-01-16 – 2017-01-19 (×6): 300 mg via ORAL
  Filled 2017-01-16: qty 3
  Filled 2017-01-16 (×5): qty 1

## 2017-01-16 MED ORDER — LIDOCAINE 4 % EX CREA
TOPICAL_CREAM | Freq: Every day | CUTANEOUS | Status: DC | PRN
  Filled 2017-01-16: qty 5

## 2017-01-16 MED ORDER — ATORVASTATIN CALCIUM 10 MG PO TABS
10.0000 mg | ORAL_TABLET | Freq: Every day | ORAL | Status: DC
  Administered 2017-01-16 – 2017-01-18 (×3): 10 mg via ORAL
  Filled 2017-01-16 (×3): qty 1

## 2017-01-16 MED ORDER — OXYCODONE-ACETAMINOPHEN 10-325 MG PO TABS: 1.0000 | ORAL_TABLET | Freq: Four times a day (QID) | ORAL | 0 refills | Status: DC | PRN

## 2017-01-16 MED ORDER — METOPROLOL TARTRATE 5 MG/5ML IV SOLN: 2.0000 mg | INTRAVENOUS | Status: DC | PRN

## 2017-01-16 MED ORDER — MORPHINE SULFATE (PF) 2 MG/ML IV SOLN: 2.0000 mg | INTRAVENOUS | Status: DC | PRN

## 2017-01-16 MED ORDER — HEPARIN SODIUM (PORCINE) 5000 UNIT/ML IJ SOLN
5000.0000 [IU] | Freq: Three times a day (TID) | INTRAMUSCULAR | Status: DC
  Administered 2017-01-16 – 2017-01-19 (×8): 5000 [IU] via SUBCUTANEOUS
  Filled 2017-01-16 (×8): qty 1

## 2017-01-16 MED ORDER — POTASSIUM CHLORIDE CRYS ER 20 MEQ PO TBCR
20.0000 meq | EXTENDED_RELEASE_TABLET | Freq: Once | ORAL | Status: AC
  Administered 2017-01-16: 20 meq via ORAL
  Filled 2017-01-16: qty 1

## 2017-01-16 MED ORDER — LINACLOTIDE 145 MCG PO CAPS
290.0000 ug | ORAL_CAPSULE | Freq: Every day | ORAL | Status: DC
  Administered 2017-01-17 – 2017-01-18 (×2): 290 ug via ORAL
  Filled 2017-01-16 (×3): qty 2
  Filled 2017-01-16: qty 1
  Filled 2017-01-16: qty 2

## 2017-01-16 MED ORDER — ASPIRIN EC 81 MG PO TBEC
81.0000 mg | DELAYED_RELEASE_TABLET | Freq: Every day | ORAL | Status: DC
  Administered 2017-01-17 – 2017-01-19 (×3): 81 mg via ORAL
  Filled 2017-01-16 (×3): qty 1

## 2017-01-16 MED ORDER — ALUM & MAG HYDROXIDE-SIMETH 200-200-20 MG/5ML PO SUSP: 15.0000 mL | ORAL | Status: DC | PRN

## 2017-01-16 MED ORDER — GUAIFENESIN-DM 100-10 MG/5ML PO SYRP: 15.0000 mL | ORAL_SOLUTION | ORAL | Status: DC | PRN

## 2017-01-16 MED ORDER — POLYETHYLENE GLYCOL 3350 17 G PO PACK
17.0000 g | PACK | Freq: Every day | ORAL | Status: DC | PRN
  Administered 2017-01-16: 17 g via ORAL
  Filled 2017-01-16: qty 1

## 2017-01-16 MED ORDER — ONDANSETRON HCL 4 MG/2ML IJ SOLN: 4.0000 mg | Freq: Four times a day (QID) | INTRAMUSCULAR | Status: DC | PRN

## 2017-01-16 MED ORDER — OXYCODONE-ACETAMINOPHEN 5-325 MG PO TABS
1.0000 | ORAL_TABLET | ORAL | Status: DC | PRN
  Administered 2017-01-16 – 2017-01-18 (×9): 2 via ORAL
  Administered 2017-01-19: 1 via ORAL
  Administered 2017-01-19: 2 via ORAL
  Administered 2017-01-19: 1 via ORAL
  Filled 2017-01-16: qty 2
  Filled 2017-01-16: qty 1
  Filled 2017-01-16 (×6): qty 2
  Filled 2017-01-16: qty 1
  Filled 2017-01-16 (×3): qty 2

## 2017-01-16 MED ORDER — AMLODIPINE BESYLATE 5 MG PO TABS
5.0000 mg | ORAL_TABLET | Freq: Every day | ORAL | Status: DC
  Administered 2017-01-17 – 2017-01-18 (×2): 5 mg via ORAL
  Filled 2017-01-16 (×3): qty 1

## 2017-01-16 MED ORDER — ALBUTEROL SULFATE (2.5 MG/3ML) 0.083% IN NEBU: 2.5000 mg | INHALATION_SOLUTION | Freq: Four times a day (QID) | RESPIRATORY_TRACT | Status: DC | PRN

## 2017-01-16 MED ORDER — HYDRALAZINE HCL 20 MG/ML IJ SOLN: 5.0000 mg | INTRAMUSCULAR | Status: DC | PRN

## 2017-01-16 MED ORDER — LABETALOL HCL 5 MG/ML IV SOLN: 10.0000 mg | INTRAVENOUS | Status: DC | PRN

## 2017-01-16 MED ORDER — PANTOPRAZOLE SODIUM 40 MG PO TBEC
40.0000 mg | DELAYED_RELEASE_TABLET | Freq: Every day | ORAL | Status: DC
  Administered 2017-01-17 – 2017-01-19 (×3): 40 mg via ORAL
  Filled 2017-01-16 (×3): qty 1

## 2017-01-16 MED ORDER — PHENOL 1.4 % MT LIQD: 1.0000 | OROMUCOSAL | Status: DC | PRN

## 2017-01-16 MED ORDER — ATORVASTATIN CALCIUM 10 MG PO TABS: 10.0000 mg | ORAL_TABLET | Freq: Every day | ORAL | 0 refills | Status: DC

## 2017-01-16 MED ORDER — MOMETASONE FUROATE 0.1 % EX CREA
1.0000 "application " | TOPICAL_CREAM | Freq: Every day | CUTANEOUS | Status: DC
  Administered 2017-01-17 – 2017-01-18 (×2): 1 via TOPICAL
  Filled 2017-01-16: qty 15

## 2017-01-16 NOTE — Care Management Note (Addendum)
Case Management Note  Patient Details  Name: Faith [name] MRN: [id number] Date of Birth: [birth year]  Subjective/Objective:   S/p fem pop, patient lives alone, she ha pcp, Dr. Bryon Lions and she has medication coverage thru medicaid, she states her niece will be able to transport her home at dc.  She states she goes to the [facility] wound care [facility] every Friday via [employer].  Patient states she would like a HHRN to help check on her wounds every two days, NCM explained to patient that [facility] services will not come out every day she states she understand,  and she would also like to have a rolling walker. Patient chose [facility],  For HHRN and rolling walker.  Will need orders.                  Action/Plan:   Expected Discharge Date:                  Expected Discharge Plan:  [place]  In-House Referral:     Discharge planning Services  CM Consult  Post Acute Care Choice:  Durable Medical Equipment, Home Health Choice offered to:  Patient  DME Arranged:  [facility] rolling DME Agency:  [place] Arranged:  RN [facility] Agency:  [place]  Status of Service:  In process, will continue to follow  If discussed at Long Length of Stay Meetings, dates discussed:    Additional Comments:  Zenon Mayo, RN 01/16/2017, 12:35 PM

## 2017-01-16 NOTE — Telephone Encounter (Signed)
spoke to family member, req lttr be sent, gave temp address to send to, mailed lttr for appt on 3/2

## 2017-01-16 NOTE — Telephone Encounter (Signed)
-----   Message from Mena Goes, RN sent at 01/16/2017  9:39 AM EST ----- Regarding: 2 weeks   ----- Message ----- From: Alvia Grove, PA-C Sent: 01/16/2017   7:31 AM To: Vvs Charge Pool  S/p right femoral to above knee popliteal bypass and common femoral endarterectomy 01/15/17  F/u with Dr. Donzetta Matters in 2 weeks.   Thanks Maudie Mercury

## 2017-01-16 NOTE — Discharge Summary (Signed)
[facility] Discharge Summary  Faith [name] 07-01-1951 66 y.o. female  [id number]  Admission Date: 01/15/2017  Discharge Date: 01/18/17  Physician: Waynetta Sandy*  Admission Diagnosis: Peripheral vascular disease with right foot ulcer I70.234  HPI:   This is a 66 y.o. female with history of hypertension and chronic every day smoker presents with right dorsal foot and overlying achillesulcerations. She states that these have been present since August she didn't valuated the wound clinic since that time. She has never had wounds before. She does have limitations to her walking which is cramping of her right calf which has been present for several years. She is never had wounds like this before. She denies fevers chills or any infection of the right foot. She is religious about attending the wound care center on Fridays. She does have a chronic cough as well secondary to her smoking. States her blood pressure is well-controlled she is nondiabetic. She states that her cholesterol is also well controlled she does not take statin or aspirin and has been taking over-the-counter ibuprofen and pain in her wound sites.  Hospital Course:  The patient was admitted to the hospital and taken to the operating room on 01/15/2017 and underwent: 1. right femoral to above knee popliteal artery bypass with 32m ringed propatent graft 2. Common femoral endarterectomy    The patient tolerated the procedure well and was transported to the PACU in stable condition. She did have a palpable right DP pulse in the recovery room.  She was transferred to [facility]    On POD 1, she had hypokalemia and was supplemented.  She had acute surgical blood loss anemia and was tolerating.  She was transferred to the telemetry floor.  On POD 2, she was doing well but did not have any supervision at home.  She continued to have a palpable right DP pulse.  On POD 3, she was seen by PT.  We will need  to make arrangements for home health physical therapy and the patient will need to make arrangements to have someone with her 24 7. Otherwise she would have to go to skilled nursing facility. She feels that she will be able to make those arrangements tomorrow.  On POD 4, She continues to have a palpable right DP pulse.  Groin wound care was discussed in detail with the pt on a couple of occasions.  She states she will have 24/7 assist and will be dc home after these are set up.    CBC    Component Value Date/Time   WBC 10.3 01/16/2017 0414   RBC 3.08 (L) 01/16/2017 0414   HGB 8.7 (L) 01/16/2017 0414   HCT 25.5 (L) 01/16/2017 0414   PLT 166 01/16/2017 0414   MCV 82.8 01/16/2017 0414   MCH 28.2 01/16/2017 0414   MCHC 34.1 01/16/2017 0414   RDW 14.8 01/16/2017 0414   LYMPHSABS 4.1 (H) 07/03/2014 1528   MONOABS 0.7 07/03/2014 1528   EOSABS 0.0 07/03/2014 1528   BASOSABS 0.0 07/03/2014 1528    BMET    Component Value Date/Time   NA 142 01/16/2017 0414   K 3.4 (L) 01/16/2017 0414   CL 121 (H) 01/16/2017 0414   CO2 16 (L) 01/16/2017 0414   GLUCOSE 92 01/16/2017 0414   BUN 10 01/16/2017 0414   CREATININE 0.66 01/16/2017 0414   CALCIUM 8.9 01/16/2017 0414   GFRNONAA >60 01/16/2017 0414   GFRAA >60 01/16/2017 0414     Discharge Instructions:  The patient is discharged to home with extensive instructions on wound care and progressive ambulation.  They are instructed not to drive or perform any heavy lifting until returning to see the physician in his office.  Discharge Instructions    Call MD for:  redness, tenderness, or signs of infection (pain, swelling, bleeding, redness, odor or green/yellow discharge around incision site)    Complete by:  As directed    Call MD for:  severe or increased pain, loss or decreased feeling  in affected limb(s)    Complete by:  As directed    Call MD for:  temperature >100.5    Complete by:  As directed    Discharge wound care:    Complete by:   As directed    Wash the groin wound with soap and water daily and pat dry. (No tub bath-only shower)  Then put a dry gauze or washcloth there to keep this area dry daily and as needed.  Do not use Vaseline or neosporin on your incisions.  Only use soap and water on your incisions and then protect and keep dry.  Wash right thigh wound daily with soap and water and pat dry.   Driving Restrictions    Complete by:  As directed    No driving for 2 weeks   Increase activity slowly    Complete by:  As directed    Walk with assistance use walker or cane as needed   Lifting restrictions    Complete by:  As directed    No lifting for 2 weeks   Resume previous diet    Complete by:  As directed       Discharge Diagnosis:  Peripheral vascular disease with right foot ulcer I70.234  Secondary Diagnosis: Patient Active Problem List   Diagnosis Date Noted  . PAD (peripheral artery disease) ([place]) 01/15/2017  . Nonhealing nonsurgical wound 12/05/2016  . HTN (hypertension) 07/06/2014  . Depression 07/06/2014  . Back pain 07/06/2014  . Chronic cholecystitis 07/05/2014  . Right sided abdominal pain 05/11/2014  . Cholelithiasis 05/11/2014   Past Medical History:  Diagnosis Date  . Anxiety   . Arthritis    "thighs; legs; hips" (07/05/2014)  . Bipolar disorder ([place])   . Cholelithiasis 07/2014  . Chronic bronchitis ([place])    "get it q yr"  . Chronic lower back pain   . Depression    pt. use to go to depression clinic, states she still has depression & anxiety but can't get to appt. so she hasn't had any med. for it in a while   . Dyspnea   . GERD (gastroesophageal reflux disease)   . High cholesterol   . Hypertension   . Peripheral vascular disease ([place])   . Schizophrenia ([place])      Allergies as of 01/16/2017      Reactions   Penicillins Itching, Other (See Comments)   REACTION: Yeast infection Has patient had a PCN  reaction causing immediate rash, facial/tongue/throat swelling, SOB or  lightheadedness with hypotension: {no Has patient had a PCN reaction causing severe rash involving mucus membranes or skin necrosis: {no Has patient had a PCN reaction that required hospitalization no Has patient had a PCN reaction occurring within the last 10 years: {no If all of the above answers are "NO", then may proceed with Cephalosporin use.      Medication List    TAKE these medications   albuterol (2.5 MG/3ML) 0.083% nebulizer solution Commonly known as:  PROVENTIL  Take 2.5 mg by nebulization every 6 (six) hours as needed for wheezing or shortness of breath.   albuterol 108 (90 Base) MCG/ACT inhaler Commonly known as:  PROVENTIL HFA;VENTOLIN HFA Inhale 1-2 puffs into the lungs every 6 (six) hours as needed for wheezing or shortness of breath.   amLODipine 5 MG tablet Commonly known as:  NORVASC Take 5 mg by mouth daily.   amLODipine-benazepril 5-10 MG capsule Commonly known as:  LOTREL Take 1 capsule by mouth daily.   aspirin EC 81 MG tablet Take 81 mg by mouth daily.   atorvastatin 10 MG tablet Commonly known as:  LIPITOR Take 1 tablet (10 mg total) by mouth daily.   diclofenac 75 MG EC tablet Commonly known as:  VOLTAREN Take 75 mg by mouth 2 (two) times daily.   diclofenac sodium 1 % Gel Commonly known as:  VOLTAREN Apply 2 g topically 4 (four) times daily.   diphenhydrAMINE 25 MG tablet Commonly known as:  BENADRYL Take 2 tablets (50 mg total) by mouth at bedtime as needed for sleep.   EPIPEN IJ Inject 1 each as directed once as needed (allergic reaction).   erythromycin ophthalmic ointment Place a 1/2 inch ribbon of ointment into the lower eyelid of both eyes three times daily.   estrogens (conjugated) 0.625 MG tablet Commonly known as:  PREMARIN Take 0.625 mg by mouth daily. Take daily for 21 days then do not take for 7 days.   fluticasone 50 MCG/ACT nasal spray Commonly known as:  FLONASE Place 2 sprays into both nostrils daily as needed for  allergies.   furosemide 20 MG tablet Commonly known as:  LASIX Take 20 mg by mouth daily as needed.   gabapentin 300 MG capsule Commonly known as:  NEURONTIN Take 300 mg by mouth 2 (two) times daily.   ibuprofen 800 MG tablet Commonly known as:  ADVIL,MOTRIN Take 800 mg by mouth 3 (three) times daily.   LINZESS 290 MCG Caps capsule Generic drug:  linaclotide Take 290 mcg by mouth daily.   loratadine 10 MG tablet Commonly known as:  CLARITIN Take 10 mg by mouth daily.   methocarbamol 750 MG tablet Commonly known as:  ROBAXIN Take 750 mg by mouth 2 (two) times daily as needed for muscle spasms.   mometasone 0.1 % ointment Commonly known as:  ELOCON Apply 1 application topically daily.   MOVANTIK 25 MG Tabs tablet Generic drug:  naloxegol oxalate Take 25 mg by mouth daily.   nicotine polacrilex 4 MG gum Commonly known as:  NICORETTE Take 4 mg by mouth as needed for smoking cessation.   nicotine polacrilex 4 MG gum Commonly known as:  NICORETTE Take 4 mg by mouth as needed for smoking cessation.   omeprazole 20 MG capsule Commonly known as:  PRILOSEC Take 20 mg by mouth 2 (two) times daily before a meal.   oxyCODONE-acetaminophen 10-325 MG tablet Commonly known as:  PERCOCET Take 1 tablet by mouth every 6 (six) hours as needed for pain. What changed:  when to take this  reasons to take this   potassium chloride SA 20 MEQ tablet Commonly known as:  K-DUR,KLOR-CON Take 1 tablet (20 mEq total) by mouth daily.       Percocet #30 No Refill  Disposition: home  Patient's condition: is Good  Follow up: 1. Dr. Donzetta Matters in 2 weeks  Instructions: 1.  Wash the groin wound with soap and water daily and pat dry. (No tub bath-only shower)  Then put  a dry gauze or washcloth there to keep this area dry daily and as needed.  Do not use Vaseline or neosporin on your incisions.  Only use soap and water on your incisions and then protect and keep dry. 2.  Hold Lotrel for 3  days and then resume.  She will have her blood pressure checked at the wound care center on Friday.  (PT may be able to check blood pressure when they come out to the house). 3.  Shower daily with soap and water starting 01/19/17   Leontine Locket, PA-C [facility] [phone] 01/16/2017  7:47 AM  - For VQI Registry use ---   Post-op:  Wound infection: No  Graft infection: No  Transfusion: No  If yes, n/a units given New Arrhythmia: No Ipsilateral amputation: No, '[ ]'$  Minor, '[ ]'$  BKA, '[ ]'$  AKA Discharge patency: [x ] Primary, '[ ]'$  Primary assisted, '[ ]'$  Secondary, '[ ]'$  Occluded Patency judged by: '[ ]'$  Dopper only, '[ ]'$  Palpable graft pulse, [ x] Palpable distal pulse, '[ ]'$  ABI inc. > 0.15, '[ ]'$  Duplex Discharge ABI: R not done, L  Discharge TBI: R , L  D/C Ambulatory Status: Ambulatory with Assistance  Complications: MI: No, '[ ]'$  Troponin only, '[ ]'$  EKG or Clinical CHF: No Resp failure:No, '[ ]'$  Pneumonia, '[ ]'$  Ventilator Chg in renal function: No, '[ ]'$  Inc. Cr > 0.5, '[ ]'$  Temp. Dialysis, '[ ]'$  Permanent dialysis Stroke: No, '[ ]'$  Minor, '[ ]'$  Major Return to OR: No  Reason for return to OR: '[ ]'$  Bleeding, '[ ]'$  Infection, '[ ]'$  Thrombosis, '[ ]'$  Revision  Discharge medications: Statin use:  yes ASA use:  yes Plavix use:  no Beta blocker use: no Coumadin use: no CCB use:  Yes ACEI use:  Yes

## 2017-01-16 NOTE — Anesthesia Postprocedure Evaluation (Signed)
Anesthesia Post Note  Patient: Faith [name]  Procedure(s) Performed: Procedure(s) (LRB): BYPASS GRAFT FEMORAL-POPLITEAL ARTERY RIGHT LEG (Right)  Patient location during evaluation: PACU Anesthesia Type: General Level of consciousness: sedated and patient cooperative Pain management: pain level controlled Vital Signs Assessment: post-procedure vital signs reviewed and stable Respiratory status: spontaneous breathing Cardiovascular status: stable Anesthetic complications: no        Last Vitals:  Vitals:   01/16/17 1444 01/16/17 2004  BP: (!) 104/50 (!) 107/45  Pulse: 63 60  Resp: 18 20  Temp: 36.9 C 36.7 C    Last Pain:  Vitals:   01/16/17 2004  TempSrc: Oral  PainSc:    Pain Goal: Patients Stated Pain Goal: 3 (01/16/17 1530)               Nolon Nations

## 2017-01-16 NOTE — Progress Notes (Addendum)
  [facility] Progress Note  Subjective  - POD #1  Pain with right ankle ulcer.   Objective Vitals:   01/16/17 0329 01/16/17 0400  BP: (!) 102/45 (!) 91/55  Pulse: 66 67  Resp: 15 12  Temp: 98.9 F (37.2 C)     Intake/Output Summary (Last 24 hours) at 01/16/17 0723 Last data filed at 01/16/17 0400  Gross per 24 hour  Intake             4000 ml  Output             1535 ml  Net             2465 ml   Palpable right DP pulse Right groin and above knee incisions clean Right posterior ankle ulcer is clean with red granulation tissue.  Assessment/Planning: 66 y.o. female is s/p: right femoral to above knee popliteal bypass with propaten and common femoral endarterectomy 1 Day Post-Op   Palpable pulse right foot.  BP was a little soft earlier in 90s. Currently SBP 100s.  ABLA stable. Monitor. Hypokalemia replete. D/c IVF.  D/c foley. Mobilize.  Transfer to the floor.   Alvia Grove 01/16/2017 7:23 AM --  Laboratory CBC    Component Value Date/Time   WBC 10.3 01/16/2017 0414   HGB 8.7 (L) 01/16/2017 0414   HCT 25.5 (L) 01/16/2017 0414   PLT 166 01/16/2017 0414    BMET    Component Value Date/Time   NA 142 01/16/2017 0414   K 3.4 (L) 01/16/2017 0414   CL 121 (H) 01/16/2017 0414   CO2 16 (L) 01/16/2017 0414   GLUCOSE 92 01/16/2017 0414   BUN 10 01/16/2017 0414   CREATININE 0.66 01/16/2017 0414   CALCIUM 8.9 01/16/2017 0414   GFRNONAA >60 01/16/2017 0414   GFRAA >60 01/16/2017 0414    COAG Lab Results  Component Value Date   INR 1.04 01/13/2017   No results found for: PTT  Antibiotics Anti-infectives    Start     Dose/Rate Route Frequency Ordered Stop   01/15/17 0715  vancomycin (VANCOCIN) 1,250 mg in sodium chloride 0.9 % 250 mL IVPB     1,250 mg 166.7 mL/hr over 90 Minutes Intravenous To Surgery 01/14/17 0939 01/15/17 0905       Virgina Jock, PA-C [facility] Office: [phone] Pager:  [phone] 01/16/2017 7:23 AM   I have independently interviewed patient and agree with PA assessment and plan above.   Jyasia Markoff C. Donzetta Matters, MD [facility] [place] Office: [phone] Pager: [phone]

## 2017-01-16 NOTE — Progress Notes (Signed)
Patient transferred to 2W22, A&O x4, VSS. Telemetry applied. Patient oriented to room and has no questions at this time.

## 2017-01-17 LAB — CBC
HCT: 26.7 % — ABNORMAL LOW (ref 36.0–46.0)
Hemoglobin: 9 g/dL — ABNORMAL LOW (ref 12.0–15.0)
MCH: 28.4 pg (ref 26.0–34.0)
MCHC: 33.7 g/dL (ref 30.0–36.0)
MCV: 84.2 fL (ref 78.0–100.0)
PLATELETS: 168 10*3/uL (ref 150–400)
RBC: 3.17 MIL/uL — AB (ref 3.87–5.11)
RDW: 15 % (ref 11.5–15.5)
WBC: 10.4 10*3/uL (ref 4.0–10.5)

## 2017-01-17 LAB — BASIC METABOLIC PANEL
ANION GAP: 5 (ref 5–15)
BUN: 8 mg/dL (ref 6–20)
CALCIUM: 9.4 mg/dL (ref 8.9–10.3)
CO2: 20 mmol/L — ABNORMAL LOW (ref 22–32)
Chloride: 118 mmol/L — ABNORMAL HIGH (ref 101–111)
Creatinine, Ser: 0.69 mg/dL (ref 0.44–1.00)
GFR calc Af Amer: >60 mL/min (ref >60)
GLUCOSE: 90 mg/dL (ref 65–99)
POTASSIUM: 3.5 mmol/L (ref 3.5–5.1)
SODIUM: 143 mmol/L (ref 135–145)

## 2017-01-17 NOTE — Progress Notes (Signed)
   VASCULAR SURGERY ASSESSMENT & PLAN:  2 Days Post-Op s/p: Right femoral to above-knee popliteal artery bypass with PTFE.  She feels like she cannot safely get up her stairs at home. Will consult physical therapy to assist with her on stairs and home when she is ready from that standpoint.  SUBJECTIVE: No complaints.  PHYSICAL EXAM: Vitals:   01/16/17 1100 01/16/17 1444 01/16/17 2004 01/17/17 0525  BP: (!) 102/55 (!) 104/50 (!) 107/45 (!) 100/52  Pulse: 69 63 60 (!) 57  Resp: '19 18 20 18  '$ Temp: 97.7 F (36.5 C) 98.5 F (36.9 C) 98.1 F (36.7 C) 98.5 F (36.9 C)  TempSrc: Oral Oral Oral Oral  SpO2: 99% 98% 97% 99%   Incisions look fine. Palpable right popliteal pulse. Wound on the right Achilles has no significant drainage or erythema.  LABS: Lab Results  Component Value Date   WBC 10.4 01/17/2017   HGB 9.0 (L) 01/17/2017   HCT 26.7 (L) 01/17/2017   MCV 84.2 01/17/2017   PLT 168 01/17/2017   Lab Results  Component Value Date   CREATININE 0.69 01/17/2017   Lab Results  Component Value Date   INR 1.04 01/13/2017   Active Problems:   PAD (peripheral artery disease) ([place])   Gae Gallop Beeper: [phone] 01/17/2017

## 2017-01-18 NOTE — Progress Notes (Addendum)
Progress Note  01/18/2017 8:09 AM 3 Days Post-Op  Subjective:  Pt says she ready to go home.  She states she did not get up and walk yesterday nor did she get up in the chair.  She wants to get up out of bed.  She gave herself a bath.  Says she goes to the wound center every Friday.  Afebrile HR  86'V-78'I NSR 696'E systolic 952% RA  Vitals:   01/17/17 2056 01/18/17 0541  BP: (!) 101/53 (!) 106/50  Pulse: 68 67  Resp: 18 18  Temp: 97.5 F (36.4 C) 98.5 F (36.9 C)    Physical Exam: Cardiac:  regular Lungs:  Non labored Incisions:  Some moisture in right groin incision; otherwise, healing nicely Extremities:  Easily palpable right DP pulse   CBC    Component Value Date/Time   WBC 10.4 01/17/2017 0410   RBC 3.17 (L) 01/17/2017 0410   HGB 9.0 (L) 01/17/2017 0410   HCT 26.7 (L) 01/17/2017 0410   PLT 168 01/17/2017 0410   MCV 84.2 01/17/2017 0410   MCH 28.4 01/17/2017 0410   MCHC 33.7 01/17/2017 0410   RDW 15.0 01/17/2017 0410   LYMPHSABS 4.1 (H) 07/03/2014 1528   MONOABS 0.7 07/03/2014 1528   EOSABS 0.0 07/03/2014 1528   BASOSABS 0.0 07/03/2014 1528    BMET    Component Value Date/Time   NA 143 01/17/2017 0410   K 3.5 01/17/2017 0410   CL 118 (H) 01/17/2017 0410   CO2 20 (L) 01/17/2017 0410   GLUCOSE 90 01/17/2017 0410   BUN 8 01/17/2017 0410   CREATININE 0.69 01/17/2017 0410   CALCIUM 9.4 01/17/2017 0410   GFRNONAA >60 01/17/2017 0410   GFRAA >60 01/17/2017 0410    INR    Component Value Date/Time   INR 1.04 01/13/2017 1214     Intake/Output Summary (Last 24 hours) at 01/18/17 0809 Last data filed at 01/17/17 1711  Gross per 24 hour  Intake              480 ml  Output                0 ml  Net              480 ml     Assessment:  66 y.o. female is s/p:  1. right femoral to above knee popliteal artery bypass with 10m ringed propatent graft 2. Common femoral endarterectomy  3 Days Post-Op  Plan: -pt doing well with easily palpable right  DP pulse -pt has not walked since POD 1-she needs to mobilize and get out of bed.  Should be able to go home today if she is mobilizing okay.  I have put in an order to get pt out and walk in halls and up to chair and call PT to see if they can work with her on the stairs as this is the only thing keeping the pt here. -DVT prophylaxis:  Heparin SQ -discussed groin wound care in detail with the pt and she expressed understanding.  SLeontine Locket PA-C [facility] [phone]/18/2018 8:09 AM  I have interviewed the patient and examined the patient. I agree with the findings by the PA.  Home today if "passes" PTx.  ADDENDUM: Physical therapy's note appreciated. We will need to make arrangements for home health physical therapy and the patient will need to make arrangements to have someone with her 24 7. Otherwise she would have to go to  skilled nursing facility. She feels that she will be able to make those arrangements tomorrow.  Gae Gallop, MD [phone]

## 2017-01-18 NOTE — Evaluation (Signed)
Physical Therapy Evaluation Patient Details Name: Faith [name] MRN: [id number] DOB: 04-10-1951 Today's Date: 01/18/2017   History of Present Illness  Faith [name] a 66 y.o.femalewith history of hypertension and chronic every day smoker presents with right dorsal foot and overlying achillesulcerations. Pt underwent a R fem pop bypass graft adn femoral endarterectomy on 01/15/17.  Clinical Impression  Pt presenting with R LE pain, R drop foot, decreased activity tolerance and increased falls risk. Pt with limited ambulation tolerance due to pain and is unable to perform ADLs without assist. Pt was indep PTA. Pt reports she has her daughter, grandson, neighbor and boyfriend who can assist her in addition to a [facility] aide. If 24/7 assist can not be provided pt will need SNF to achieve maximal functional recovery for safe transition home.    Follow Up Recommendations Home health PT;Supervision/Assistance - 24 hour (if pt doesn't have 24/7 will need SNF)    Equipment Recommendations  Rolling walker with 5" wheels (tub bench)    Recommendations for Other Services       Precautions / Restrictions Precautions Precautions: Fall Precaution Comments: R drop foot Restrictions Weight Bearing Restrictions: Yes RLE Weight Bearing: Weight bearing as tolerated      Mobility  Bed Mobility               General bed mobility comments: pt up in [facility] upon PT arrival  Transfers Overall transfer level: Needs assistance Equipment used: Rolling walker (2 wheeled) Transfers: Sit to/from [facility] Sit to Stand: Min assist Stand pivot transfers: Min assist       General transfer comment: v/c's for safe hand placement. pt impulsively did a std pvt back to bed without RW and was very unsteady/unsafe, landing on edge of the bed. discussed safety and insturcted pt to use RW  Ambulation/Gait Ambulation/Gait assistance: Min assist Ambulation Distance (Feet): 40 Feet Assistive  device: Rolling walker (2 wheeled) Gait Pattern/deviations: Step-to pattern;Decreased step length - right;Decreased stance time - right;Decreased dorsiflexion - right;Decreased weight shift to right;Shuffle Gait velocity: slow Gait velocity interpretation: Below normal speed for age/gender General Gait Details: pt unable to clear R foot due to foot drop, pt SOB and very antaligic. SpO2 at 96% on RA. pt with increased ain  Stairs Stairs: Yes Stairs assistance: Min assist Stair Management: One rail Right;Sideways;Step to pattern Number of Stairs: 5 General stair comments: max directional v/c's for sequencing  Wheelchair Mobility    Modified Rankin (Stroke Patients Only)       Balance Overall balance assessment: Needs assistance Sitting-balance support: Feet supported;Bilateral upper extremity supported Sitting balance-Leahy Scale: Poor Sitting balance - Comments: unable to don socks in sitting without falling   Standing balance support: Bilateral upper extremity supported Standing balance-Leahy Scale: Poor Standing balance comment: unable to tolerate R LE WBing, required UE assist/support                             Pertinent Vitals/Pain Pain Assessment: 0-10 Pain Score: 8  Pain Location: R ankle and at incision site Pain Descriptors / Indicators: Throbbing;Constant Pain Intervention(s): Monitored during session    Home Living Family/patient expects to be discharged to:: Private residence Living Arrangements: Alone Available Help at Discharge: Family;Personal care attendant;Available PRN/intermittently Type of Home: House Home Access: Stairs to enter Entrance Stairs-Rails: Right Entrance Stairs-Number of Steps: 6 Home Layout: One level Home Equipment: Cane - single point      Prior Function Level of Independence:  Independent         Comments: has an aide that does grocery shopping, was indep with everything else with cane     Hand Dominance    Dominant Hand: Right    Extremity/Trunk Assessment   Upper Extremity Assessment Upper Extremity Assessment: Generalized weakness    Lower Extremity Assessment Lower Extremity Assessment: RLE deficits/detail;LLE deficits/detail RLE Deficits / Details: pt only able to wiggle toes, no active DF, can get to neutral passive. unable to achieve terminal knee extension. greatly limited by pain LLE Deficits / Details: grossly 4/5    Cervical / Trunk Assessment Cervical / Trunk Assessment: Normal  Communication   Communication: No difficulties  Cognition Arousal/Alertness: Awake/alert Behavior During Therapy: Anxious Overall Cognitive Status: History of cognitive impairments - at baseline Area of Impairment: Safety/judgement;Awareness;Problem solving         Safety/Judgement: Decreased awareness of safety;Decreased awareness of deficits Awareness: Intellectual Problem Solving: Difficulty sequencing;Requires verbal cues;Requires tactile cues;Slow processing General Comments: pt with h/o schizophrenia    General Comments General comments (skin integrity, edema, etc.): pt with noted R ankle wound however covered by dressing    Exercises     Assessment/Plan    PT Assessment Patient needs continued PT services  PT Problem List Decreased strength;Decreased activity tolerance;Decreased balance;Decreased mobility;Decreased coordination;Decreased knowledge of use of DME;Decreased safety awareness;Cardiopulmonary status limiting activity;Pain          PT Treatment Interventions DME instruction;Gait training;Stair training;Functional mobility training;Therapeutic activities;Therapeutic exercise;Balance training;Neuromuscular re-education    PT Goals (Current goals can be found in the Care Plan section)  Acute Rehab PT Goals Patient Stated Goal: home today PT Goal Formulation: With patient Time For Goal Achievement: 01/25/17 Potential to Achieve Goals: Good    Frequency Min 3X/week    Barriers to discharge Decreased caregiver support lives alone    Co-evaluation               End of Session Equipment Utilized During Treatment: Gait belt Activity Tolerance: Patient limited by pain Patient left: in chair;with call bell/phone within reach Nurse Communication: Mobility status         Time: 3748-2707 PT Time Calculation (min) (ACUTE ONLY): 29 min   Charges:   PT Evaluation $PT Eval Moderate Complexity: 1 Procedure PT Treatments $Gait Training: 8-22 mins   PT G Codes:        Moussa Wiegand M Brinson Tozzi 01/18/2017, 10:03 AM   Kittie Plater, PT, DPT Pager #: [phone] Office #: [phone]

## 2017-01-19 NOTE — Progress Notes (Signed)
Physical Therapy Treatment Patient Details Name: Faith [name] MRN: [id number] DOB: 1951-09-24 Today's Date: 01/19/2017    History of Present Illness Etta Gassett a 66 y.o.femalewith history of hypertension and chronic every day smoker presents with right dorsal foot and overlying achillesulcerations. Pt underwent a R fem pop bypass graft adn femoral endarterectomy on 01/15/17.    PT Comments    Pt progressing well toward goals requiring less physical assist for ambulation and stairs. Pt able to verbalize and demonstrate proper form for ascending/descending stairs. Pt instructed in HEP to allow for continued improvements in strength, balance and activity tolerance upon d/c. Current d/c plans remain appropriate when medically ready. Will continue to follow.    Follow Up Recommendations  Home health PT;Supervision/Assistance - 24 hour     Equipment Recommendations  Rolling walker with 5" wheels (tub bench)    Recommendations for Other Services       Precautions / Restrictions Precautions Precautions: Fall Precaution Comments: R foot drop Restrictions Weight Bearing Restrictions: Yes RLE Weight Bearing: Weight bearing as tolerated    Mobility  Bed Mobility               General bed mobility comments: pt seated EOB upon PT arrival  Transfers Overall transfer level: Needs assistance Equipment used: Rolling walker (2 wheeled) Transfers: Sit to/from Stand (x 2 (to/from [facility] and bed)) Sit to Stand: Min assist         General transfer comment: v/c for hand placement; rapid descent to [facility]; minA for power up from [facility]  Ambulation/Gait Ambulation/Gait assistance: Min assist Ambulation Distance (Feet): 120 Feet Assistive device: Rolling walker (2 wheeled) Gait Pattern/deviations: Step-to pattern;Decreased step length - right;Decreased stance time - right;Decreased stride length;Decreased dorsiflexion - right;Decreased weight shift to right;Antalgic;Trunk flexed Gait  velocity: decreased Gait velocity interpretation: Below normal speed for age/gender General Gait Details: slow speed; poor clearance of R foot secondary to foot drop; v/c for upright posture   Stairs Stairs: Yes   Stair Management: One rail Right;Sideways;Step to pattern Number of Stairs: 6 General stair comments: increased time  Wheelchair Mobility    Modified Rankin (Stroke Patients Only)       Balance Overall balance assessment: Needs assistance Sitting-balance support: Feet supported;No upper extremity supported Sitting balance-Leahy Scale: Good Sitting balance - Comments: sat EOB x 10 min for exercise; able to shift weight to bring hips closer to EOB   Standing balance support: Bilateral upper extremity supported Standing balance-Leahy Scale: Poor Standing balance comment: heavily reliant upon RW                    Cognition Arousal/Alertness: Awake/alert Behavior During Therapy: WFL for tasks assessed/performed Overall Cognitive Status: History of cognitive impairments - at baseline Area of Impairment: Problem solving             Problem Solving: Difficulty sequencing;Requires verbal cues;Requires tactile cues General Comments: pt with h/o schipzophrenia; v/c for sequencing of tasks    Exercises General Exercises - Lower Extremity Ankle Circles/Pumps: AROM;Both;5 reps;Seated Gluteal Sets: Strengthening;Both;5 reps;Seated Long Arc Quad: AROM;Both;5 reps;Seated Hip ABduction/ADduction: AROM;Both;5 reps;Seated Hip Flexion/Marching: AROM;5 reps;Seated Toe Raises: AROM;Both;5 reps;Seated Heel Raises: AROM;Both;5 reps;Seated    General Comments General comments (skin integrity, edema, etc.): R ankle wound dressing intact      Pertinent Vitals/Pain Pain Assessment: 0-10 Pain Score: 5  Pain Location: incision site Pain Descriptors / Indicators: Discomfort Pain Intervention(s): Limited activity within patient's tolerance;Monitored during session     Home Living  Prior Function            PT Goals (current goals can now be found in the care plan section) Progress towards PT goals: Progressing toward goals    Frequency    Min 3X/week      PT Plan Current plan remains appropriate    Co-evaluation             End of Session Equipment Utilized During Treatment: Gait belt Activity Tolerance: Patient tolerated treatment well Patient left: in bed;with call bell/phone within reach Nurse Communication: Mobility status PT Visit Diagnosis: Unsteadiness on feet (R26.81);Muscle weakness (generalized) (M62.81);Difficulty in walking, not elsewhere classified (R26.2);Pain Pain - Right/Left: Right Pain - part of body: Leg     Time: 1007-1035 PT Time Calculation (min) (ACUTE ONLY): 28 min  Charges:  $Gait Training: 8-22 mins $Therapeutic Exercise: 8-22 mins                    G Codes:       Tracie Harrier 03-Feb-2017, 11:29 AM   Tracie Harrier, SPT Acute Rehab SPT [phone]

## 2017-01-19 NOTE — Progress Notes (Addendum)
  Progress Note    01/19/2017 7:14 AM 4 Days Post-Op  Subjective:  Wants to go home  Tm 99 now afebrile HR  80'D-98'P NSR 382'N systolic 05% RA  Vitals:   01/18/17 2056 01/19/17 0514  BP: (!) 107/54 106/61  Pulse: 70 (!) 56  Resp: 18 16  Temp: 98.5 F (36.9 C) 98.7 F (37.1 C)    Physical Exam: Cardiac:  regular Lungs:  Non labored Incisions:  Clean and dry; right groin is drier today than yesterday. Extremities:  Easily palpable right DP pulse   CBC    Component Value Date/Time   WBC 10.4 01/17/2017 0410   RBC 3.17 (L) 01/17/2017 0410   HGB 9.0 (L) 01/17/2017 0410   HCT 26.7 (L) 01/17/2017 0410   PLT 168 01/17/2017 0410   MCV 84.2 01/17/2017 0410   MCH 28.4 01/17/2017 0410   MCHC 33.7 01/17/2017 0410   RDW 15.0 01/17/2017 0410   LYMPHSABS 4.1 (H) 07/03/2014 1528   MONOABS 0.7 07/03/2014 1528   EOSABS 0.0 07/03/2014 1528   BASOSABS 0.0 07/03/2014 1528    BMET    Component Value Date/Time   NA 143 01/17/2017 0410   K 3.5 01/17/2017 0410   CL 118 (H) 01/17/2017 0410   CO2 20 (L) 01/17/2017 0410   GLUCOSE 90 01/17/2017 0410   BUN 8 01/17/2017 0410   CREATININE 0.69 01/17/2017 0410   CALCIUM 9.4 01/17/2017 0410   GFRNONAA >60 01/17/2017 0410   GFRAA >60 01/17/2017 0410    INR    Component Value Date/Time   INR 1.04 01/13/2017 1214     Intake/Output Summary (Last 24 hours) at 01/19/17 0714 Last data filed at 01/18/17 1243  Gross per 24 hour  Intake              476 ml  Output                0 ml  Net              476 ml     Assessment:  66 y.o. female is s/p:  1. right femoral to above knee popliteal artery bypass with 88m ringed propatent graft 2. Common femoral endarterectomy  4 Days Post-Op  Plan: -pt doing well this morning with an easily palpable right DP pulse -PT recommended HHPT as long as she has someone to stay with her 24/7.   -will order CM consult for [facility] -I have discussed this with the pt and she says she will have  someone.   -I discussed groin wound care again with the pt -pt was on Lotrel (Norvasc and Benazepril) prior to admit-her BP has been soft and I have asked her to not take this for 2-3 days and then restart.  Once she is home, her BP should rebound.  She will have her BP checked either by PT or the wound care center on Friday.     SLeontine Locket PA-C [facility] [phone]/19/2018 7:14 AM  I have interviewed the patient and examined the patient. I agree with the findings by the PA.  CGae Gallop MD [phone]

## 2017-01-19 NOTE — Care Management Note (Signed)
Case Management Note Previous CM note initiated by Zenon Mayo, RN--01/16/2017, 12:35 PM   Patient Details  Name: Faith [name] MRN: [id number] Date of Birth: [birth year]  Subjective/Objective:   S/p fem pop, patient lives alone, she ha pcp, Dr. Bryon Lions and she has medication coverage thru medicaid, she states her niece will be able to transport her home at dc.  She states she goes to the [place] [facility] wound care center every Friday via [employer].  Patient states she would like a HHRN to help check on her wounds every two days, NCM explained to patient that [facility] services will not come out every day she states she understand,  and she would also like to have a rolling walker. Patient chose [facility],  For HHRN and rolling walker.  Will need orders.                  Action/Plan: Pt tx from 4E to 2W  Expected Discharge Date:  01/19/17               Expected Discharge Plan:  [place]  In-House Referral:     Discharge planning Services  CM Consult  Post Acute Care Choice:  Durable Medical Equipment, Home Health Choice offered to:  Patient  DME Arranged:  Walker rolling, Tub bench DME Agency:  [place]:  PT, NA [place] Agency:     Status of Service:  Completed, signed off  If discussed at [place] of Stay Meetings, dates discussed:    Discharge Disposition: home/self care   Additional Comments:  01/19/17 1000- [name] RN, CM- pt for d/c home today- orders placed for DME and HHPT- notified [place] for DME needs- tub bench and RW to be delivered to room prior to discharge- spoke with pt at bedside- who states that she will have someone at home 24/7 with her to assist her, she also reports that she has a help line button at home. Orders were placed for HHPT- however pt has Medicaid only and does not have a qualifying dx for [facility] therapy. Explained this to pt and pt does not want to pay out of pocket for any [facility] services. Pt  reports that she plans to f/u with the outpt wound care center as she was doing PTA. Pt also reports that she use to have a PCS aide and is going to look into getting this service back with her PCP. Pt also will need assistance with transportation home as she is concerned that she will not be able to get up in her niece's truck- CSW aware and will see pt for transportation needs.   [name], RN 01/19/2017, 9:59 AM [phone]

## 2017-01-19 NOTE — Discharge Instructions (Signed)
Hold blood pressure medicine (Lotrel) for 3 days then start taking again.  Have blood pressure checked at the wound care center on Friday or have physical therapy check blood pressure if they are able.

## 2017-01-19 NOTE — Progress Notes (Signed)
Discharge instructions (including medications) discussed with and copy provided to patient/caregiver 

## 2017-01-23 ENCOUNTER — Ambulatory Visit: Payer: Medicaid Other | Admitting: Gastroenterology

## 2017-01-23 ENCOUNTER — Encounter: Payer: Self-pay | Admitting: Vascular Surgery

## 2017-01-23 DIAGNOSIS — M79661 Pain in right lower leg: Secondary | ICD-10-CM | POA: Diagnosis not present

## 2017-01-23 DIAGNOSIS — I70201 Unspecified atherosclerosis of native arteries of extremities, right leg: Secondary | ICD-10-CM | POA: Diagnosis not present

## 2017-01-23 DIAGNOSIS — M79651 Pain in right thigh: Secondary | ICD-10-CM | POA: Diagnosis not present

## 2017-01-23 DIAGNOSIS — I10 Essential (primary) hypertension: Secondary | ICD-10-CM | POA: Diagnosis not present

## 2017-01-23 DIAGNOSIS — Z87891 Personal history of nicotine dependence: Secondary | ICD-10-CM | POA: Diagnosis not present

## 2017-01-23 DIAGNOSIS — L89612 Pressure ulcer of right heel, stage 2: Secondary | ICD-10-CM | POA: Diagnosis not present

## 2017-01-26 DIAGNOSIS — L89612 Pressure ulcer of right heel, stage 2: Secondary | ICD-10-CM | POA: Diagnosis not present

## 2017-01-27 ENCOUNTER — Telehealth: Payer: Self-pay | Admitting: *Deleted

## 2017-01-27 NOTE — Telephone Encounter (Signed)
Patient called in to triage phone re: swelling and redness at her knee. I had to [facility] at [phone].  She is S/P right femoral-popliteal BPG on 01-15-17 by Dr. Donzetta Matters. Her 1st postop appt is scheduled with Dr. Donzetta Matters this Friday 01-30-17.  Will await her call back to discuss her concerns.

## 2017-01-29 ENCOUNTER — Telehealth: Payer: Self-pay

## 2017-01-29 NOTE — Telephone Encounter (Signed)
rec'd voice message on Triage phone from pt. Re: c/o "swelling of knee and thigh with redness and pain".  Attempted to return call to pt.  Left voice message to return call to office.

## 2017-01-30 ENCOUNTER — Encounter (HOSPITAL_BASED_OUTPATIENT_CLINIC_OR_DEPARTMENT_OTHER): Payer: Medicaid Other | Attending: Internal Medicine

## 2017-01-30 ENCOUNTER — Ambulatory Visit (INDEPENDENT_AMBULATORY_CARE_PROVIDER_SITE_OTHER): Payer: Self-pay | Admitting: Vascular Surgery

## 2017-01-30 ENCOUNTER — Encounter: Payer: Self-pay | Admitting: Vascular Surgery

## 2017-01-30 VITALS — BP 115/56 | HR 82 | Temp 98.4°F | Resp 14 | Ht 61.0 in | Wt 149.0 lb

## 2017-01-30 DIAGNOSIS — I10 Essential (primary) hypertension: Secondary | ICD-10-CM | POA: Insufficient documentation

## 2017-01-30 DIAGNOSIS — L89612 Pressure ulcer of right heel, stage 2: Secondary | ICD-10-CM | POA: Insufficient documentation

## 2017-01-30 DIAGNOSIS — S31103A Unspecified open wound of abdominal wall, right lower quadrant without penetration into peritoneal cavity, initial encounter: Secondary | ICD-10-CM | POA: Insufficient documentation

## 2017-01-30 DIAGNOSIS — I739 Peripheral vascular disease, unspecified: Secondary | ICD-10-CM

## 2017-01-30 DIAGNOSIS — Y838 Other surgical procedures as the cause of abnormal reaction of the patient, or of later complication, without mention of misadventure at the time of the procedure: Secondary | ICD-10-CM | POA: Insufficient documentation

## 2017-01-30 NOTE — Progress Notes (Signed)
Subjective:     Patient ID: Faith [name], female   DOB: December 28, 1950, 66 y.o.   MRN: [id number]  HPI 66 year old female recently underwent right lower extremity bypass with graft as well as right common femoral endarterectomy for ulceration on the dorsum of her foot as well as heal. She is now doing very well without complaints wounds are healing well.   Review of Systems No issues today    Objective:   Physical Exam aaox3 fibrionous exudate in right groin wound Palpable AT at ankle Dorsum of right foot has healed, right heal with small residual ulceration    Assessment/plan     66 year old female returns following right lower extremity bypass. She is doing well other pulses palpable at her ankle. I expect her wound to continue to heal. She will follow up in 3 months with lower extremity duplex and ABI should she not have issues sooner.  Feliberto Stockley C. Donzetta Matters, MD [facility] [place] Office: [phone] Pager: [phone]

## 2017-02-06 NOTE — Addendum Note (Signed)
Addended by: Lianne Cure A on: 02/06/2017 09:43 AM   Modules accepted: Orders

## 2017-02-12 DIAGNOSIS — S31103A Unspecified open wound of abdominal wall, right lower quadrant without penetration into peritoneal cavity, initial encounter: Secondary | ICD-10-CM | POA: Diagnosis not present

## 2017-02-12 DIAGNOSIS — Y838 Other surgical procedures as the cause of abnormal reaction of the patient, or of later complication, without mention of misadventure at the time of the procedure: Secondary | ICD-10-CM | POA: Diagnosis not present

## 2017-02-12 DIAGNOSIS — L89612 Pressure ulcer of right heel, stage 2: Secondary | ICD-10-CM | POA: Diagnosis not present

## 2017-02-12 DIAGNOSIS — I10 Essential (primary) hypertension: Secondary | ICD-10-CM | POA: Diagnosis not present

## 2017-02-14 ENCOUNTER — Emergency Department (HOSPITAL_COMMUNITY): Payer: Medicaid Other

## 2017-02-14 ENCOUNTER — Inpatient Hospital Stay (HOSPITAL_COMMUNITY)
Admission: EM | Admit: 2017-02-14 | Discharge: 2017-02-18 | DRG: 064 | Disposition: A | Discharge status: Home Health | Payer: Medicaid Other | Attending: Internal Medicine | Admitting: Internal Medicine

## 2017-02-14 ENCOUNTER — Encounter (HOSPITAL_COMMUNITY): Payer: Self-pay | Admitting: Radiology

## 2017-02-14 ENCOUNTER — Observation Stay (HOSPITAL_COMMUNITY): Payer: Medicaid Other

## 2017-02-14 DIAGNOSIS — G8191 Hemiplegia, unspecified affecting right dominant side: Secondary | ICD-10-CM | POA: Diagnosis present

## 2017-02-14 DIAGNOSIS — I63412 Cerebral infarction due to embolism of left middle cerebral artery: Secondary | ICD-10-CM | POA: Diagnosis present

## 2017-02-14 DIAGNOSIS — F32A Depression, unspecified: Secondary | ICD-10-CM | POA: Diagnosis present

## 2017-02-14 DIAGNOSIS — R2990 Unspecified symptoms and signs involving the nervous system: Secondary | ICD-10-CM | POA: Diagnosis present

## 2017-02-14 DIAGNOSIS — F329 Major depressive disorder, single episode, unspecified: Secondary | ICD-10-CM | POA: Diagnosis present

## 2017-02-14 DIAGNOSIS — E785 Hyperlipidemia, unspecified: Secondary | ICD-10-CM | POA: Diagnosis present

## 2017-02-14 DIAGNOSIS — I739 Peripheral vascular disease, unspecified: Secondary | ICD-10-CM | POA: Diagnosis present

## 2017-02-14 DIAGNOSIS — L89513 Pressure ulcer of right ankle, stage 3: Secondary | ICD-10-CM | POA: Diagnosis present

## 2017-02-14 DIAGNOSIS — E876 Hypokalemia: Secondary | ICD-10-CM | POA: Diagnosis present

## 2017-02-14 DIAGNOSIS — Z8249 Family history of ischemic heart disease and other diseases of the circulatory system: Secondary | ICD-10-CM

## 2017-02-14 DIAGNOSIS — I10 Essential (primary) hypertension: Secondary | ICD-10-CM | POA: Diagnosis present

## 2017-02-14 DIAGNOSIS — R42 Dizziness and giddiness: Secondary | ICD-10-CM

## 2017-02-14 DIAGNOSIS — F419 Anxiety disorder, unspecified: Secondary | ICD-10-CM | POA: Diagnosis present

## 2017-02-14 DIAGNOSIS — J42 Unspecified chronic bronchitis: Secondary | ICD-10-CM | POA: Diagnosis present

## 2017-02-14 DIAGNOSIS — T148XXA Other injury of unspecified body region, initial encounter: Secondary | ICD-10-CM | POA: Diagnosis present

## 2017-02-14 DIAGNOSIS — D649 Anemia, unspecified: Secondary | ICD-10-CM | POA: Diagnosis present

## 2017-02-14 DIAGNOSIS — F209 Schizophrenia, unspecified: Secondary | ICD-10-CM | POA: Diagnosis present

## 2017-02-14 DIAGNOSIS — Z7951 Long term (current) use of inhaled steroids: Secondary | ICD-10-CM

## 2017-02-14 DIAGNOSIS — G8929 Other chronic pain: Secondary | ICD-10-CM | POA: Diagnosis present

## 2017-02-14 DIAGNOSIS — R51 Headache: Secondary | ICD-10-CM

## 2017-02-14 DIAGNOSIS — Z7982 Long term (current) use of aspirin: Secondary | ICD-10-CM | POA: Diagnosis not present

## 2017-02-14 DIAGNOSIS — Z79899 Other long term (current) drug therapy: Secondary | ICD-10-CM

## 2017-02-14 DIAGNOSIS — E7800 Pure hypercholesterolemia, unspecified: Secondary | ICD-10-CM | POA: Diagnosis present

## 2017-02-14 DIAGNOSIS — Z833 Family history of diabetes mellitus: Secondary | ICD-10-CM

## 2017-02-14 DIAGNOSIS — F1210 Cannabis abuse, uncomplicated: Secondary | ICD-10-CM | POA: Diagnosis present

## 2017-02-14 DIAGNOSIS — Z87891 Personal history of nicotine dependence: Secondary | ICD-10-CM

## 2017-02-14 DIAGNOSIS — F319 Bipolar disorder, unspecified: Secondary | ICD-10-CM | POA: Diagnosis present

## 2017-02-14 DIAGNOSIS — H5462 Unqualified visual loss, left eye, normal vision right eye: Secondary | ICD-10-CM | POA: Diagnosis present

## 2017-02-14 DIAGNOSIS — I6789 Other cerebrovascular disease: Secondary | ICD-10-CM | POA: Diagnosis not present

## 2017-02-14 DIAGNOSIS — I639 Cerebral infarction, unspecified: Secondary | ICD-10-CM

## 2017-02-14 DIAGNOSIS — R9389 Abnormal findings on diagnostic imaging of other specified body structures: Secondary | ICD-10-CM

## 2017-02-14 DIAGNOSIS — K219 Gastro-esophageal reflux disease without esophagitis: Secondary | ICD-10-CM | POA: Diagnosis present

## 2017-02-14 DIAGNOSIS — R519 Headache, unspecified: Secondary | ICD-10-CM

## 2017-02-14 LAB — COMPREHENSIVE METABOLIC PANEL
ALBUMIN: 3.6 g/dL (ref 3.5–5.0)
ALT: 11 U/L — ABNORMAL LOW (ref 14–54)
ANION GAP: 10 (ref 5–15)
AST: 16 U/L (ref 15–41)
Alkaline Phosphatase: 116 U/L (ref 38–126)
BUN: 15 mg/dL (ref 6–20)
CHLORIDE: 117 mmol/L — AB (ref 101–111)
CO2: 15 mmol/L — AB (ref 22–32)
Calcium: 9.4 mg/dL (ref 8.9–10.3)
Creatinine, Ser: 0.66 mg/dL (ref 0.44–1.00)
GFR calc Af Amer: >60 mL/min (ref >60)
GFR calc non Af Amer: >60 mL/min (ref >60)
GLUCOSE: 167 mg/dL — AB (ref 65–99)
POTASSIUM: 2.2 mmol/L — AB (ref 3.5–5.1)
SODIUM: 142 mmol/L (ref 135–145)
TOTAL PROTEIN: 7 g/dL (ref 6.5–8.1)
Total Bilirubin: 0.4 mg/dL (ref 0.3–1.2)

## 2017-02-14 LAB — I-STAT TROPONIN, ED: Troponin i, poc: 0 ng/mL (ref 0.00–0.08)

## 2017-02-14 LAB — CBC
HEMATOCRIT: 36.5 % (ref 36.0–46.0)
Hemoglobin: 12.2 g/dL (ref 12.0–15.0)
MCH: 28.2 pg (ref 26.0–34.0)
MCHC: 33.4 g/dL (ref 30.0–36.0)
MCV: 84.3 fL (ref 78.0–100.0)
PLATELETS: 220 10*3/uL (ref 150–400)
RBC: 4.33 MIL/uL (ref 3.87–5.11)
RDW: 16.5 % — AB (ref 11.5–15.5)
WBC: 12.1 10*3/uL — ABNORMAL HIGH (ref 4.0–10.5)

## 2017-02-14 LAB — DIFFERENTIAL
BASOS ABS: 0 10*3/uL (ref 0.0–0.1)
BASOS PCT: 0 %
EOS ABS: 0.2 10*3/uL (ref 0.0–0.7)
EOS PCT: 1 %
LYMPHS ABS: 5.8 10*3/uL — AB (ref 0.7–4.0)
Lymphocytes Relative: 49 %
Monocytes Absolute: 0.9 10*3/uL (ref 0.1–1.0)
Monocytes Relative: 7 %
Neutro Abs: 5.2 10*3/uL (ref 1.7–7.7)
Neutrophils Relative %: 43 %

## 2017-02-14 LAB — PROTIME-INR
INR: 1.08
PROTHROMBIN TIME: 14.1 s (ref 11.4–15.2)

## 2017-02-14 LAB — I-STAT CHEM 8, ED
BUN: 17 mg/dL (ref 6–20)
CHLORIDE: 117 mmol/L — AB (ref 101–111)
Calcium, Ion: 1.39 mmol/L (ref 1.15–1.40)
Creatinine, Ser: 0.6 mg/dL (ref 0.44–1.00)
GLUCOSE: 160 mg/dL — AB (ref 65–99)
HEMATOCRIT: 38 % (ref 36.0–46.0)
HEMOGLOBIN: 12.9 g/dL (ref 12.0–15.0)
POTASSIUM: 2.2 mmol/L — AB (ref 3.5–5.1)
SODIUM: 146 mmol/L — AB (ref 135–145)
TCO2: 17 mmol/L (ref 0–100)

## 2017-02-14 LAB — BASIC METABOLIC PANEL
ANION GAP: 8 (ref 5–15)
BUN: 11 mg/dL (ref 6–20)
CO2: 16 mmol/L — ABNORMAL LOW (ref 22–32)
Calcium: 9 mg/dL (ref 8.9–10.3)
Chloride: 110 mmol/L (ref 101–111)
Creatinine, Ser: 0.55 mg/dL (ref 0.44–1.00)
GFR calc Af Amer: >60 mL/min (ref >60)
Glucose, Bld: 120 mg/dL — ABNORMAL HIGH (ref 65–99)
POTASSIUM: 2.5 mmol/L — AB (ref 3.5–5.1)
SODIUM: 134 mmol/L — AB (ref 135–145)

## 2017-02-14 LAB — CBG MONITORING, ED: GLUCOSE-CAPILLARY: 167 mg/dL — AB (ref 65–99)

## 2017-02-14 LAB — MAGNESIUM: MAGNESIUM: 2.1 mg/dL (ref 1.7–2.4)

## 2017-02-14 LAB — APTT: APTT: 23 s — AB (ref 24–36)

## 2017-02-14 LAB — PHOSPHORUS: PHOSPHORUS: 2.9 mg/dL (ref 2.5–4.6)

## 2017-02-14 MED ORDER — SODIUM CHLORIDE 0.9 % IV SOLN
30.0000 meq | Freq: Once | INTRAVENOUS | Status: DC
  Filled 2017-02-14 (×2): qty 15

## 2017-02-14 MED ORDER — POTASSIUM CHLORIDE CRYS ER 20 MEQ PO TBCR
40.0000 meq | EXTENDED_RELEASE_TABLET | Freq: Once | ORAL | Status: AC
  Administered 2017-02-14: 40 meq via ORAL

## 2017-02-14 MED ORDER — ONDANSETRON HCL 4 MG/2ML IJ SOLN: 4.0000 mg | Freq: Four times a day (QID) | INTRAMUSCULAR | Status: DC | PRN

## 2017-02-14 MED ORDER — SODIUM CHLORIDE 0.9 % IV SOLN
30.0000 meq | INTRAVENOUS | Status: AC
  Administered 2017-02-14 – 2017-02-15 (×2): 30 meq via INTRAVENOUS
  Filled 2017-02-14 (×3): qty 15

## 2017-02-14 MED ORDER — ONDANSETRON HCL 4 MG/2ML IJ SOLN
4.0000 mg | Freq: Once | INTRAMUSCULAR | Status: DC
Start: 2017-02-14 — End: 2017-02-18

## 2017-02-14 MED ORDER — LORAZEPAM 2 MG/ML IJ SOLN
0.5000 mg | Freq: Four times a day (QID) | INTRAMUSCULAR | Status: DC | PRN
  Filled 2017-02-14: qty 1

## 2017-02-14 MED ORDER — TETRACAINE HCL 0.5 % OP SOLN
2.0000 [drp] | Freq: Once | OPHTHALMIC | Status: DC
  Filled 2017-02-14: qty 2

## 2017-02-14 MED ORDER — ONDANSETRON HCL 4 MG/2ML IJ SOLN
INTRAMUSCULAR | Status: AC
  Filled 2017-02-14: qty 2

## 2017-02-14 MED ORDER — ACETAMINOPHEN 325 MG PO TABS
650.0000 mg | ORAL_TABLET | Freq: Four times a day (QID) | ORAL | Status: DC | PRN
  Administered 2017-02-14 – 2017-02-17 (×5): 650 mg via ORAL
  Filled 2017-02-14 (×5): qty 2

## 2017-02-14 MED ORDER — POTASSIUM CHLORIDE CRYS ER 20 MEQ PO TBCR
40.0000 meq | EXTENDED_RELEASE_TABLET | Freq: Three times a day (TID) | ORAL | Status: AC
  Administered 2017-02-14 (×2): 40 meq via ORAL
  Filled 2017-02-14 (×2): qty 2

## 2017-02-14 MED ORDER — IOPAMIDOL (ISOVUE-370) INJECTION 76%
50.0000 mL | Freq: Once | INTRAVENOUS | Status: AC | PRN
  Administered 2017-02-14: 50 mL via INTRAVENOUS

## 2017-02-14 MED ORDER — HYDRALAZINE HCL 20 MG/ML IJ SOLN: 10.0000 mg | Freq: Three times a day (TID) | INTRAMUSCULAR | Status: DC | PRN

## 2017-02-14 MED ORDER — BUTALBITAL-APAP-CAFFEINE 50-325-40 MG PO TABS
1.0000 | ORAL_TABLET | Freq: Four times a day (QID) | ORAL | Status: DC | PRN
  Administered 2017-02-14 – 2017-02-16 (×4): 1 via ORAL
  Filled 2017-02-14 (×4): qty 1

## 2017-02-14 MED ORDER — SODIUM CHLORIDE 0.9% FLUSH
3.0000 mL | Freq: Two times a day (BID) | INTRAVENOUS | Status: DC
  Administered 2017-02-14 – 2017-02-18 (×10): 3 mL via INTRAVENOUS

## 2017-02-14 MED ORDER — ONDANSETRON HCL 4 MG PO TABS
4.0000 mg | ORAL_TABLET | Freq: Four times a day (QID) | ORAL | Status: DC | PRN
  Administered 2017-02-15: 4 mg via ORAL
  Filled 2017-02-14: qty 1

## 2017-02-14 MED ORDER — ACETAMINOPHEN 650 MG RE SUPP: 650.0000 mg | Freq: Four times a day (QID) | RECTAL | Status: DC | PRN

## 2017-02-14 NOTE — Consult Note (Addendum)
Neurology Consultation Reason for Consult: Dizziness Referring Physician: Roxanne Mins, D  CC: Dizziness  History is obtained from: Patient  HPI: Faith [name] is a 66 y.o. female with a history of peripheral artery disease who underwent a femoral bypass last month. She was in her normal state of health on laying down around 11:30 PM, however on awakening shortly after 3 AM she was severely vertiginous. She had nausea and vomiting. She also has severe left eye pain and blurred vision.   ROS: A 14 point ROS was performed and is negative except as noted in the HPI.   Past Medical History:  Diagnosis Date  . Anxiety   . Arthritis    "thighs; legs; hips" (07/05/2014)  . Bipolar disorder ([place])   . Cholelithiasis 07/2014  . Chronic bronchitis ([place])    "get it q yr"  . Chronic lower back pain   . Depression    pt. use to go to depression clinic, states she still has depression & anxiety but can't get to appt. so she hasn't had any med. for it in a while   . Dyspnea   . GERD (gastroesophageal reflux disease)   . High cholesterol   . Hypertension   . Peripheral vascular disease ([place])   . Schizophrenia ([place])      Family History  Problem Relation Age of Onset  . Cancer Mother 51    colon  . Diabetes Mother   . Hypertension Mother   . Cancer Father   . Diabetes Father   . Hypertension Father      Social History:  reports that she quit smoking about 6 weeks ago. Her smoking use included Cigarettes. She has a 72.00 pack-year smoking history. Her smokeless tobacco use includes Chew. She reports that she drinks about 1.2 oz of alcohol per week . She reports that she does not use drugs.   Exam: Current vital signs: Vitals:   02/14/17 0454 02/14/17 0500  BP: (!) 151/55 (!) 144/60  Pulse: 64 68  Resp: (!) 22 17  Temp: 98.2 F (36.8 C)     Vital signs in last 24 hours:     Physical Exam  Constitutional: Appears Elderly Psych: Affect appropriate to situation Eyes: No scleral  injection HENT: No OP obstrucion Head: Normocephalic.  Cardiovascular: Normal rate and regular rhythm.  Respiratory: Effort normal and breath sounds normal to anterior ascultation GI: Soft.  No distension. There is no tenderness.  Skin: WDI  Neuro: Mental Status: Patient is awake, alert, interactive and appropriate Patient is able to give a clear and coherent history. No signs of aphasia or neglect Cranial Nerves: II: Visual Fields are full in the right eye. In the left eye she has hand waving vision. Pupils are equal, but there is an afferent pupillary defect on the left. There does appear to be some disc pallor on the left. III,IV, VI: She has disconjugate gaze with the left eye down and out compared to the right. She has full extra ocular movements the right eye. She does have nystagmus initially, though this improves over the course of my exam. V: Facial sensation is symmetric to temperature VII: Facial movement is symmetric.  VIII: hearing is intact to voice X: Uvula elevates symmetrically XI: Shoulder shrug is symmetric. XII: tongue is midline without atrophy or fasciculations.  Motor: Tone is normal. Bulk is normal. 5/5 strength was present in bilateral arms, she guards the right leg due to recent surgery but does appear to have good strength in  it without drift. 5/5 strength in the left leg Sensory: She does have some numbness of the right leg, which she states is baseline since her surgery, but improving. Cerebellar: FNF and HKS are intact bilaterally   I have reviewed labs in epic and the results pertinent to this consultation are: Severe hypokalemia  I have reviewed the images obtained: CT head-no acute findings, CT angiogram-left carotid occlusion  Impression: 66 year old female with nystagmus, vertigo, nausea/vomiting, disconjugate gaze. The symptoms at all be suggestive of posterior circulation infarct. The loss of vision in her left eye, however, would be more  consistent with retinal artery occlusion.   I'm concerned that a left carotid occlusion may be acute, however she has no symptoms suggestive of left hemispheric dysfunction and I don't think she meets criteria for acute intervention at this point with an NIH of 3 in no distal target.   I would favor hydration to help maintain blood pressures.   Recommendations: 1. HgbA1c, fasting lipid panel 2. MRI of the brain without contrast 3. Frequent neuro checks 4. Echocardiogram 5. Prophylactic therapy-Antiplatelet med: Aspirin - dose '325mg'$  PO or '300mg'$  PR 6. Risk factor modification 7. Telemetry monitoring 8. PT consult, OT consult, Speech consult 9. please page stroke NP  Or  PA  Or MD  from 8am -4 pm starting 3/17 as this patient will be followed by the stroke team at this point.   You can look them up on [email or web address]   10. IV fluids to maintain BP.    Roland Rack, MD [facility] [phone]  If 7pm- 7am, please page neurology on call as listed in [place].

## 2017-02-14 NOTE — ED Triage Notes (Signed)
Pt presents to ER from home with [facility] for new onset dizziness (room spinning), n/v, double vision, and L sided facial pain; pt denies headache, tingling, weakness, numbness; pt oriented x 4

## 2017-02-14 NOTE — Progress Notes (Signed)
Patient arrived to unit.  Able to transfer from bed to bed.  Alert, oriented.  No noted signs of distress.  Complaints of left sided headache.  Medicated with effective results.  Telemetry applied and verified x's 2.  Wound noted to right ankle and right groin prior to admission.  Patient stated she is under wound care due to prior surgery.  Wound to groin pink without drainage or odor, covered with dry dressing.  Wound to right ankle with minimal drainage, covered with allevyn bandage from prior admission.  Resting in bed with eyes closed.  Family supportive and at bedside.

## 2017-02-14 NOTE — H&P (Signed)
[facility] History and Physical  Faith [name] VFI:[id number] DOB: 05/31/1951 DOA: 02/14/2017  Referring physician:  PCP: Philis Fendt, MD   Chief Complaint: "All of a sudden everything started spinning and the left side of the face hurt."  HPI: Faith [name] is a 66 y.o. female  with past medical history significant for peripheral vascular disease, recurrent bronchitis, hypertension, schizophrenia, reflux presents emergency room with chief complaint of strokelike symptoms. Patient states that she's been in normal state of health for the last week. Acutely after. So, with the last 12 hours patient began to have symptoms of spinning, visual disturbance and left-sided facial pain. This alarmed the patient and she asked a friend activate EMS. Patient has no history of head trauma or stroke.  ED course: Neurology was consulted. CT head negative for brain bleed. CTA with multiple abnormalities and hospitalists service consulted for admission.  Review of Systems:  As per HPI otherwise 10 point review of systems negative.    Past Medical History:  Diagnosis Date  . Anxiety   . Arthritis    "thighs; legs; hips" (07/05/2014)  . Bipolar disorder ([place])   . Cholelithiasis 07/2014  . Chronic bronchitis ([place])    "get it q yr"  . Chronic lower back pain   . Depression    pt. use to go to depression clinic, states she still has depression & anxiety but can't get to appt. so she hasn't had any med. for it in a while   . Dyspnea   . GERD (gastroesophageal reflux disease)   . High cholesterol   . Hypertension   . Peripheral vascular disease ([place])   . Schizophrenia [facility])    Past Surgical History:  Procedure Laterality Date  . ABDOMINAL AORTOGRAM N/A 01/07/2017   Procedure: Abdominal Aortogram;  Surgeon: Waynetta Sandy, MD;  Location: [place] [facility];  Service: Cardiovascular;  Laterality: N/A;  . ABDOMINAL HYSTERECTOMY    . ANKLE FRACTURE SURGERY Right   . ANKLE HARDWARE  REMOVAL Right   . APPENDECTOMY  ~ 1963  . BREAST BIOPSY Bilateral   . BREAST CYST EXCISION Bilateral    "not cancer"  . CATARACT EXTRACTION W/ INTRAOCULAR LENS  IMPLANT, BILATERAL    . CHOLECYSTECTOMY N/A 07/05/2014   Procedure: LAPAROSCOPIC CHOLECYSTECTOMY WITH INTRAOPERATIVE CHOLANGIOGRAM;  Surgeon: Gayland Curry, MD;  Location: [place];  Service: General;  Laterality: N/A;  . EXCISIONAL HEMORRHOIDECTOMY    . FEMORAL-POPLITEAL BYPASS GRAFT Right 01/15/2017   Procedure: BYPASS GRAFT FEMORAL-POPLITEAL ARTERY RIGHT LEG;  Surgeon: Waynetta Sandy, MD;  Location: [facility];  Service: Vascular;  Laterality: Right;  . LAPAROSCOPIC CHOLECYSTECTOMY  07/05/2014  . LOWER EXTREMITY ANGIOGRAPHY Bilateral 01/07/2017   Procedure: Lower Extremity Angiography;  Surgeon: Waynetta Sandy, MD;  Location: [facility];  Service: Cardiovascular;  Laterality: Bilateral;  . MULTIPLE TOOTH EXTRACTIONS  05/2013   "pulled my upper teeth"  . PILONIDAL CYST EXCISION     Social History:  reports that she quit smoking about 6 weeks ago. Her smoking use included Cigarettes. She has a 72.00 pack-year smoking history. Her smokeless tobacco use includes Chew. She reports that she drinks about 1.2 oz of alcohol per week . She reports that she does not use drugs.  Allergies  Allergen Reactions  . Penicillins Itching and Other (See Comments)    REACTION: Yeast infection  Has patient had a PCN  reaction causing immediate rash, facial/tongue/throat swelling, SOB or lightheadedness with hypotension: {no Has patient had a  PCN reaction causing severe rash involving mucus membranes or skin necrosis: {no Has patient had a PCN reaction that required hospitalization no Has patient had a PCN reaction occurring within the last 10 years: {no If all of the above answers are "NO", then may proceed with Cephalosporin use.    Family History  Problem Relation Age of Onset  . Cancer Mother 83    colon  . Diabetes Mother     . Hypertension Mother   . Cancer Father   . Diabetes Father   . Hypertension Father      Prior to Admission medications   Medication Sig Start Date End Date Taking? Authorizing Provider  albuterol (PROVENTIL HFA;VENTOLIN HFA) 108 (90 Base) MCG/ACT inhaler Inhale 1-2 puffs into the lungs every 6 (six) hours as needed for wheezing or shortness of breath.    Historical Provider, MD  albuterol (PROVENTIL) (2.5 MG/3ML) 0.083% nebulizer solution Take 2.5 mg by nebulization every 6 (six) hours as needed for wheezing or shortness of breath.    Historical Provider, MD  amLODipine (NORVASC) 5 MG tablet Take 5 mg by mouth daily.    Historical Provider, MD  amLODipine-benazepril (LOTREL) 5-10 MG per capsule Take 1 capsule by mouth daily.    Historical Provider, MD  aspirin EC 81 MG tablet Take 81 mg by mouth daily.    Historical Provider, MD  atorvastatin (LIPITOR) 10 MG tablet Take 1 tablet (10 mg total) by mouth daily. 01/16/17   Alvia Grove, PA-C  diclofenac (VOLTAREN) 75 MG EC tablet Take 75 mg by mouth 2 (two) times daily.    Historical Provider, MD  diclofenac sodium (VOLTAREN) 1 % GEL Apply 2 g topically 4 (four) times daily.    Historical Provider, MD  diphenhydrAMINE (BENADRYL) 25 MG tablet Take 2 tablets (50 mg total) by mouth at bedtime as needed for sleep. 09/11/15   Evelina Bucy, MD  EPINEPHrine (EPIPEN IJ) Inject 1 each as directed once as needed (allergic reaction).     Historical Provider, MD  erythromycin ophthalmic ointment Place a 1/2 inch ribbon of ointment into the lower eyelid of both eyes three times daily. 12/23/15   Olivia Canter Sam, PA-C  estrogens, conjugated, (PREMARIN) 0.625 MG tablet Take 0.625 mg by mouth daily. Take daily for 21 days then do not take for 7 days.    Historical Provider, MD  fluticasone (FLONASE) 50 MCG/ACT nasal spray Place 2 sprays into both nostrils daily as needed for allergies.  09/10/15   Historical Provider, MD  furosemide (LASIX) 20 MG tablet Take 20  mg by mouth daily as needed.     Historical Provider, MD  gabapentin (NEURONTIN) 300 MG capsule Take 300 mg by mouth 2 (two) times daily.     Historical Provider, MD  ibuprofen (ADVIL,MOTRIN) 800 MG tablet Take 800 mg by mouth 3 (three) times daily.    Historical Provider, MD  Linaclotide Rolan Lipa) 290 MCG CAPS capsule Take 290 mcg by mouth daily.    Historical Provider, MD  loratadine (CLARITIN) 10 MG tablet Take 10 mg by mouth daily.    Historical Provider, MD  methocarbamol (ROBAXIN) 750 MG tablet Take 750 mg by mouth 2 (two) times daily as needed for muscle spasms.    Historical Provider, MD  mometasone (ELOCON) 0.1 % ointment Apply 1 application topically daily.    Historical Provider, MD  MOVANTIK 25 MG TABS tablet Take 25 mg by mouth daily. 12/09/16   Historical Provider, MD  nicotine polacrilex (NICORETTE) 4 MG  gum Take 4 mg by mouth as needed for smoking cessation.    Historical Provider, MD  omeprazole (PRILOSEC) 20 MG capsule Take 20 mg by mouth 2 (two) times daily before a meal.    Historical Provider, MD  oxyCODONE-acetaminophen (PERCOCET) 10-325 MG tablet Take 1 tablet by mouth every 6 (six) hours as needed for pain. 01/16/17   Alvia Grove, PA-C  potassium chloride SA (K-DUR,KLOR-CON) 20 MEQ tablet Take 1 tablet (20 mEq total) by mouth daily. 02/26/14   Orlie Dakin, MD   Physical Exam: Vitals:   02/14/17 0500 02/14/17 0714 02/14/17 0730 02/14/17 0808  BP: (!) 144/60 134/60 133/61 (!) 121/52  Pulse: 68 66 68 72  Resp: 17 (!) 21 (!) 24 16  Temp:      TempSrc:      SpO2: 100% 99% 99% 100%  Weight:        Wt Readings from Last 3 Encounters:  02/14/17 67.1 kg (147 lb 14.9 oz)  01/30/17 67.6 kg (149 lb)  01/13/17 63.2 kg (139 lb 4.8 oz)    General:  Appears calm and comfortable, Alert and oriented 2 (unable to tell the year) Eyes:  PERRL, EOMI, normal lids, iris ENT:  grossly normal hearing, lips & tongue Neck:  no LAD, masses or thyromegaly Cardiovascular:  RRR, no  m/r/g. No LE edema.  Respiratory:  CTA bilaterally, no w/r/r. Normal respiratory effort. Abdomen:  soft, ntnd Skin:  no rash or induration seen on limited exam Musculoskeletal:  grossly normal tone BUE/BLE Psychiatric:  grossly normal mood and affect, speech fluent and appropriate Neurologic:  Nystagmus noted when looking to the right and up with the left eye otherwise cranial nerves II through XII grossly intact moves all extremities in coordinated fashion.          Labs on Admission:  Basic Metabolic Panel:  Recent Labs Lab 02/14/17 0420 02/14/17 0425 02/14/17 0429  NA 142  --  146*  K 2.2*  --  2.2*  CL 117*  --  117*  CO2 15*  --   --   GLUCOSE 167*  --  160*  BUN 15  --  17  CREATININE 0.66  --  0.60  CALCIUM 9.4  --   --   MG  --  2.1  --   PHOS  --  2.9  --    Liver Function Tests:  Recent Labs Lab 02/14/17 0420  AST 16  ALT 11*  ALKPHOS 116  BILITOT 0.4  PROT 7.0  ALBUMIN 3.6   No results for input(s): LIPASE, AMYLASE in the last 168 hours. No results for input(s): AMMONIA in the last 168 hours. CBC:  Recent Labs Lab 02/14/17 0420 02/14/17 0429  WBC 12.1*  --   NEUTROABS 5.2  --   HGB 12.2 12.9  HCT 36.5 38.0  MCV 84.3  --   PLT 220  --    Cardiac Enzymes: No results for input(s): CKTOTAL, CKMB, CKMBINDEX, TROPONINI in the last 168 hours.  BNP (last 3 results) No results for input(s): BNP in the last 8760 hours.  ProBNP (last 3 results) No results for input(s): PROBNP in the last 8760 hours.   Serum creatinine: 0.6 mg/dL 02/14/17 0429 Estimated creatinine clearance: 62.2 mL/min  CBG:  Recent Labs Lab 02/14/17 0422  GLUCAP 167*    Radiological Exams on Admission: Ct Angio Head W Or Wo Contrast  Result Date: 02/14/2017 CLINICAL DATA:  Left-sided facial pain EXAM: CT ANGIOGRAPHY HEAD AND NECK TECHNIQUE:  Multidetector CT imaging of the head and neck was performed using the standard protocol during bolus administration of intravenous  contrast. Multiplanar CT image reconstructions and MIPs were obtained to evaluate the vascular anatomy. Carotid stenosis measurements (when applicable) are obtained utilizing NASCET criteria, using the distal internal carotid diameter as the denominator. CONTRAST:  50 mL Isovue 370 IV COMPARISON:  Same day head CT FINDINGS: CTA NECK FINDINGS Aortic arch: There is no aneurysm or dissection of the visualized ascending aorta or aortic arch. 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 The visualized proximal subclavian arteries are normal. Right carotid system: The right common carotid origin is widely patent. There is no common carotid or internal carotid artery dissection or aneurysm. No hemodynamically significant stenosis. Left carotid system: The left common carotid origin is widely patent. 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 There is reconstitution at the communicating segment of the intracranial left ICA. Vertebral arteries: The vertebral system is right dominant. The right vertebral artery arises from the right common carotid artery. There is moderate atherosclerotic calcification within the proximal right V1 segment. The left vertebral artery arises from the left subclavian artery without origin stenosis. Both vertebral arteries are normal to their confluence with the basilar artery. Skeleton: There is no bony spinal canal stenosis. No lytic or blastic lesions. Other neck: The nasopharynx is clear. The oropharynx and hypopharynx are normal. The epiglottis is normal. The supraglottic larynx, glottis and subglottic larynx are normal. No retropharyngeal collection. The parapharyngeal spaces are preserved. The parotid and submandibular glands are normal. No sialolithiasis or salivary ductal dilatation. The thyroid gland is normal. There is no cervical lymphadenopathy. Upper chest: There are multiple enlarged upper  mediastinal lymph nodes, measuring up to 2.0 cm at right level 4. There is incompletely visualized distortion within the anterior left upper lobe, likely scarring. Review of the MIP images confirms the above findings CTA HEAD FINDINGS Anterior circulation: --Intracranial internal carotid arteries: The proximal intracranial left internal carotid artery is occluded. There is opacification of the communicating segment via collateral or retrograde flow. There is atherosclerotic calcification of the right ICA lacerum, cavernous and clinoid segments without significant stenosis. --Anterior cerebral arteries: Normal. --Middle cerebral arteries: Normal. --Posterior communicating arteries: Present on the right. Not seen on the left. Posterior circulation: --Posterior cerebral arteries: Normal. --Superior cerebellar arteries: Normal. --Basilar artery: Normal. --Anterior inferior cerebellar arteries: Normal. --Posterior inferior cerebellar arteries: Normal. Venous sinuses: As permitted by contrast timing, patent. Anatomic variants: None Delayed phase: Not performed. Review of the MIP images confirms the above findings IMPRESSION: 1. 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 2. No other intracranial arterial occlusion or high-grade stenosis. 3. Multiple enlarged upper mediastinal lymph nodes measuring up to 2 cm. These are concerning for a lymphoproliferative process or lymphatic spread of malignancy from an unidentified source. 4. 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 Electronically Signed   By: Ulyses Jarred M.D.   On: 02/14/2017 05:10   Ct Angio Neck W Or Wo Contrast  Result Date: 02/14/2017 CLINICAL DATA:  Left-sided facial pain EXAM: CT ANGIOGRAPHY HEAD AND NECK TECHNIQUE: Multidetector CT imaging of the head and neck was performed  using the standard protocol during bolus administration of intravenous contrast. Multiplanar CT image reconstructions and MIPs were obtained to evaluate the vascular anatomy. Carotid  stenosis measurements (when applicable) are obtained utilizing NASCET criteria, using the distal internal carotid diameter as the denominator. CONTRAST:  50 mL Isovue 370 IV COMPARISON:  Same day head CT FINDINGS: CTA NECK FINDINGS Aortic arch: There is no aneurysm or dissection of the visualized ascending aorta or aortic arch. 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 The visualized proximal subclavian arteries are normal. Right carotid system: The right common carotid origin is widely patent. There is no common carotid or internal carotid artery dissection or aneurysm. No hemodynamically significant stenosis. Left carotid system: The left common carotid origin is widely patent. 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 There is reconstitution at the communicating segment of the intracranial left ICA. Vertebral arteries: The vertebral system is right dominant. The right vertebral artery arises from the right common carotid artery. There is moderate atherosclerotic calcification within the proximal right V1 segment. The left vertebral artery arises from the left subclavian artery without origin stenosis. Both vertebral arteries are normal to their confluence with the basilar artery. Skeleton: There is no bony spinal canal stenosis. No lytic or blastic lesions. Other neck: The nasopharynx is clear. The oropharynx and hypopharynx are normal. The epiglottis is normal. The supraglottic larynx, glottis and subglottic larynx are normal. No retropharyngeal collection. The parapharyngeal spaces are preserved. The parotid and submandibular glands are normal. No sialolithiasis or salivary ductal dilatation. The thyroid gland is normal. There is no  cervical lymphadenopathy. Upper chest: There are multiple enlarged upper mediastinal lymph nodes, measuring up to 2.0 cm at right level 4. There is incompletely visualized distortion within the anterior left upper lobe, likely scarring. Review of the MIP images confirms the above findings CTA HEAD FINDINGS Anterior circulation: --Intracranial internal carotid arteries: The proximal intracranial left internal carotid artery is occluded. There is opacification of the communicating segment via collateral or retrograde flow. There is atherosclerotic calcification of the right ICA lacerum, cavernous and clinoid segments without significant stenosis. --Anterior cerebral arteries: Normal. --Middle cerebral arteries: Normal. --Posterior communicating arteries: Present on the right. Not seen on the left. Posterior circulation: --Posterior cerebral arteries: Normal. --Superior cerebellar arteries: Normal. --Basilar artery: Normal. --Anterior inferior cerebellar arteries: Normal. --Posterior inferior cerebellar arteries: Normal. Venous sinuses: As permitted by contrast timing, patent. Anatomic variants: None Delayed phase: Not performed. Review of the MIP images confirms the above findings IMPRESSION: 1. 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 2. No other intracranial arterial occlusion or high-grade stenosis. 3. Multiple enlarged upper mediastinal lymph nodes measuring up to 2 cm. These are concerning for a lymphoproliferative process or lymphatic spread of malignancy from an unidentified source. 4. 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 Electronically Signed   By: Ulyses Jarred M.D.   On: 02/14/2017 05:10   Ct Head Code Stroke W/o Cm  Result Date: 02/14/2017 CLINICAL DATA:  Code stroke.  Dizziness and left facial pain EXAM: CT HEAD  WITHOUT CONTRAST TECHNIQUE: Contiguous axial images were obtained from the base of the skull through the vertex without intravenous contrast. COMPARISON:  None. FINDINGS: Brain: No mass lesion, intraparenchymal hemorrhage or extra-axial collection. No evidence of acute cortical infarct. There is an old lacunar infarct in the left thalamus. Vascular: No hyperdense vessel or unexpected calcification. Skull: Normal visualized skull base, calvarium and extracranial soft tissues. Sinuses/Orbits: No sinus  fluid levels or advanced mucosal thickening. No mastoid effusion. Normal orbits. ASPECTS [facility] Stroke Program Early CT Score) - Ganglionic level infarction (caudate, lentiform nuclei, internal capsule, insula, M1-M3 cortex): 7 - Supraganglionic infarction (M4-M6 cortex): 3 Total score (0-10 with 10 being normal): 10 IMPRESSION: 1. No acute intracranial abnormality. 2. 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 3. ASPECTS is 10. These results were called by telephone at the time of interpretation on 02/14/2017 at 4:50 am to Dr. Leonel Ramsay, who verbally acknowledged these results. Electronically Signed   By: Ulyses Jarred M.D.   On: 02/14/2017 04:50    EKG: pending  Assessment/Plan Principal Problem:   Stroke-like symptom Active Problems:   HTN (hypertension)   Depression   Nonhealing nonsurgical wound   Low potassium syndrome  Stroke CT head negative for acute bleed MRI ordered Aspirin full dose given  And daily A1c ordered Lipid panel ordered Admitted to telemetry bed Echo ordered Carotid Dopplers ordered MRA ordered Stroke team to follow patient Permissive HTN SLP/PT/OT consults Neuro onboard  L ICA occlusion Chronic Getting carotid doppler  Abnormal CT finding 2 cm mediastinal lymph nodes CT chest ordered to look for possible malignancy  Low potassium Replete by mouth and IV Recheck this afternoon Check magnesium phosphorus  Hypertension When necessary hydralazine 10 mg IV as  needed for severe blood pressure  Depression/schizophrenia/anxiety When necessary Ativan IV No active SI or HI  Lower extremity ulcer Wound care consult  Code Status: FULL DVT Prophylaxis: SCD Family Communication: called dgtr [phone] Disposition Plan: Pending Improvement  Status: obs tele  Elwin Mocha, MD Family Medicine [facility] [email or web address] Password [email or web address]

## 2017-02-14 NOTE — ED Provider Notes (Signed)
[facility] DEPT Provider Note   CSN: [id number] Arrival date & time: 02/14/17  4742   An emergency department physician performed an initial assessment on this suspected stroke patient at 70.  History   Chief Complaint Chief Complaint  Patient presents with  . Code Stroke    HPI Allis Martinique is a 66 y.o. female.  She presented to the ED as a code stroke. She had eaten at about 4:30 PM. She woke up at about 11:30 with pain in the left side of her face and associated sense of things spinning with nausea and vomiting. She has noted some blurring of her vision. She denies any weakness or numbness or tingling.   The history is provided by the patient.    Past Medical History:  Diagnosis Date  . Anxiety   . Arthritis    "thighs; legs; hips" (07/05/2014)  . Bipolar disorder ([place])   . Cholelithiasis 07/2014  . Chronic bronchitis ([place])    "get it q yr"  . Chronic lower back pain   . Depression    pt. use to go to depression clinic, states she still has depression & anxiety but can't get to appt. so she hasn't had any med. for it in a while   . Dyspnea   . GERD (gastroesophageal reflux disease)   . High cholesterol   . Hypertension   . Peripheral vascular disease ([place])   . Schizophrenia [facility])     Patient Active Problem List   Diagnosis Date Noted  . PAD (peripheral artery disease) ([place]) 01/15/2017  . Nonhealing nonsurgical wound 12/05/2016  . HTN (hypertension) 07/06/2014  . Depression 07/06/2014  . Back pain 07/06/2014  . Chronic cholecystitis 07/05/2014  . Right sided abdominal pain 05/11/2014  . Cholelithiasis 05/11/2014    Past Surgical History:  Procedure Laterality Date  . ABDOMINAL AORTOGRAM N/A 01/07/2017   Procedure: Abdominal Aortogram;  Surgeon: Waynetta Sandy, MD;  Location: [facility];  Service: Cardiovascular;  Laterality: N/A;  . ABDOMINAL HYSTERECTOMY    . ANKLE FRACTURE SURGERY Right   . ANKLE HARDWARE REMOVAL Right   .  APPENDECTOMY  ~ 1963  . BREAST BIOPSY Bilateral   . BREAST CYST EXCISION Bilateral    "not cancer"  . CATARACT EXTRACTION W/ INTRAOCULAR LENS  IMPLANT, BILATERAL    . CHOLECYSTECTOMY N/A 07/05/2014   Procedure: LAPAROSCOPIC CHOLECYSTECTOMY WITH INTRAOPERATIVE CHOLANGIOGRAM;  Surgeon: Gayland Curry, MD;  Location: [facility];  Service: General;  Laterality: N/A;  . EXCISIONAL HEMORRHOIDECTOMY    . FEMORAL-POPLITEAL BYPASS GRAFT Right 01/15/2017   Procedure: BYPASS GRAFT FEMORAL-POPLITEAL ARTERY RIGHT LEG;  Surgeon: Waynetta Sandy, MD;  Location: [facility];  Service: Vascular;  Laterality: Right;  . LAPAROSCOPIC CHOLECYSTECTOMY  07/05/2014  . LOWER EXTREMITY ANGIOGRAPHY Bilateral 01/07/2017   Procedure: Lower Extremity Angiography;  Surgeon: Waynetta Sandy, MD;  Location: [facility];  Service: Cardiovascular;  Laterality: Bilateral;  . MULTIPLE TOOTH EXTRACTIONS  05/2013   "pulled my upper teeth"  . PILONIDAL CYST EXCISION      OB History    No data available       Home Medications    Prior to Admission medications   Medication Sig Start Date End Date Taking? Authorizing Provider  albuterol (PROVENTIL HFA;VENTOLIN HFA) 108 (90 Base) MCG/ACT inhaler Inhale 1-2 puffs into the lungs every 6 (six) hours as needed for wheezing or shortness of breath.    Historical Provider, MD  albuterol (PROVENTIL) (2.5 MG/3ML) 0.083% nebulizer  solution Take 2.5 mg by nebulization every 6 (six) hours as needed for wheezing or shortness of breath.    Historical Provider, MD  amLODipine (NORVASC) 5 MG tablet Take 5 mg by mouth daily.    Historical Provider, MD  amLODipine-benazepril (LOTREL) 5-10 MG per capsule Take 1 capsule by mouth daily.    Historical Provider, MD  aspirin EC 81 MG tablet Take 81 mg by mouth daily.    Historical Provider, MD  atorvastatin (LIPITOR) 10 MG tablet Take 1 tablet (10 mg total) by mouth daily. 01/16/17   Alvia Grove, PA-C  diclofenac (VOLTAREN) 75 MG EC tablet  Take 75 mg by mouth 2 (two) times daily.    Historical Provider, MD  diclofenac sodium (VOLTAREN) 1 % GEL Apply 2 g topically 4 (four) times daily.    Historical Provider, MD  diphenhydrAMINE (BENADRYL) 25 MG tablet Take 2 tablets (50 mg total) by mouth at bedtime as needed for sleep. 09/11/15   Evelina Bucy, MD  EPINEPHrine (EPIPEN IJ) Inject 1 each as directed once as needed (allergic reaction).     Historical Provider, MD  erythromycin ophthalmic ointment Place a 1/2 inch ribbon of ointment into the lower eyelid of both eyes three times daily. 12/23/15   Olivia Canter Sam, PA-C  estrogens, conjugated, (PREMARIN) 0.625 MG tablet Take 0.625 mg by mouth daily. Take daily for 21 days then do not take for 7 days.    Historical Provider, MD  fluticasone (FLONASE) 50 MCG/ACT nasal spray Place 2 sprays into both nostrils daily as needed for allergies.  09/10/15   Historical Provider, MD  furosemide (LASIX) 20 MG tablet Take 20 mg by mouth daily as needed.     Historical Provider, MD  gabapentin (NEURONTIN) 300 MG capsule Take 300 mg by mouth 2 (two) times daily.     Historical Provider, MD  ibuprofen (ADVIL,MOTRIN) 800 MG tablet Take 800 mg by mouth 3 (three) times daily.    Historical Provider, MD  Linaclotide Rolan Lipa) 290 MCG CAPS capsule Take 290 mcg by mouth daily.    Historical Provider, MD  loratadine (CLARITIN) 10 MG tablet Take 10 mg by mouth daily.    Historical Provider, MD  methocarbamol (ROBAXIN) 750 MG tablet Take 750 mg by mouth 2 (two) times daily as needed for muscle spasms.    Historical Provider, MD  mometasone (ELOCON) 0.1 % ointment Apply 1 application topically daily.    Historical Provider, MD  MOVANTIK 25 MG TABS tablet Take 25 mg by mouth daily. 12/09/16   Historical Provider, MD  nicotine polacrilex (NICORETTE) 4 MG gum Take 4 mg by mouth as needed for smoking cessation.    Historical Provider, MD  omeprazole (PRILOSEC) 20 MG capsule Take 20 mg by mouth 2 (two) times daily before a meal.     Historical Provider, MD  oxyCODONE-acetaminophen (PERCOCET) 10-325 MG tablet Take 1 tablet by mouth every 6 (six) hours as needed for pain. 01/16/17   Alvia Grove, PA-C  potassium chloride SA (K-DUR,KLOR-CON) 20 MEQ tablet Take 1 tablet (20 mEq total) by mouth daily. 02/26/14   Orlie Dakin, MD    Family History Family History  Problem Relation Age of Onset  . Cancer Mother 26    colon  . Diabetes Mother   . Hypertension Mother   . Cancer Father   . Diabetes Father   . Hypertension Father     Social History Social History  Substance Use Topics  . Smoking status: Former Smoker  Packs/day: 1.50    Years: 48.00    Types: Cigarettes    Quit date: 12/31/2016  . Smokeless tobacco: Current User    Types: Chew  . Alcohol use 1.2 oz/week    2 Cans of beer per week     Comment:  only on the weekend     Allergies   Penicillins   Review of Systems Review of Systems  All other systems reviewed and are negative.    Physical Exam Updated Vital Signs BP (!) 144/60   Pulse 68   Temp 98.2 F (36.8 C) (Oral)   Resp 17   Wt 147 lb 14.9 oz (67.1 kg)   SpO2 100%   BMI 27.95 kg/m   Physical Exam  Nursing note and vitals reviewed.  66 year old female, resting comfortably and in no acute distress. Vital signs are significant for hypertension. Oxygen saturation is 100%, which is normal. Head is normocephalic and atraumatic. PERRLA. Eyes are disconjugate, which is normal for the patient. Oropharynx is clear. There is no facial asymmetry. Tongue protrudes in the midline. Neck is nontender and supple without adenopathy or JVD. Back is nontender and there is no CVA tenderness. Lungs are clear without rales, wheezes, or rhonchi. Chest is nontender. Heart has regular rate and rhythm without murmur. Abdomen is soft, flat, nontender without masses or hepatosplenomegaly and peristalsis is normoactive. Extremities have no cyanosis or edema, full range of motion is  present. Skin is warm and dry without rash. Neurologic: She is awake, alert, oriented. Speech is mildly to moderately dysarthric. Cranial nerves are intact except as noted above, there are no motor or sensory deficits. There is no pronator drift. Nystagmus is present intermittently.  ED Treatments / Results  Labs (all labs ordered are listed, but only abnormal results are displayed) Labs Reviewed  APTT - Abnormal; Notable for the following:       Result Value   aPTT 23 (*)    All other components within normal limits  CBC - Abnormal; Notable for the following:    WBC 12.1 (*)    RDW 16.5 (*)    All other components within normal limits  DIFFERENTIAL - Abnormal; Notable for the following:    Lymphs Abs 5.8 (*)    All other components within normal limits  COMPREHENSIVE METABOLIC PANEL - Abnormal; Notable for the following:    Potassium 2.2 (*)    Chloride 117 (*)    CO2 15 (*)    Glucose, Bld 167 (*)    ALT 11 (*)    All other components within normal limits  CBG MONITORING, ED - Abnormal; Notable for the following:    Glucose-Capillary 167 (*)    All other components within normal limits  I-STAT CHEM 8, ED - Abnormal; Notable for the following:    Sodium 146 (*)    Potassium 2.2 (*)    Chloride 117 (*)    Glucose, Bld 160 (*)    All other components within normal limits  PROTIME-INR  I-STAT TROPOININ, ED    EKG  EKG Interpretation None       Radiology Ct Angio Head W Or Wo Contrast  Result Date: 02/14/2017 CLINICAL DATA:  Left-sided facial pain EXAM: CT ANGIOGRAPHY HEAD AND NECK TECHNIQUE: Multidetector CT imaging of the head and neck was performed using the standard protocol during bolus administration of intravenous contrast. Multiplanar CT image reconstructions and MIPs were obtained to evaluate the vascular anatomy. Carotid stenosis measurements (when applicable) are obtained  utilizing NASCET criteria, using the distal internal carotid diameter as the denominator.  CONTRAST:  50 mL Isovue 370 IV COMPARISON:  Same day head CT FINDINGS: CTA NECK FINDINGS Aortic arch: There is no aneurysm or dissection of the visualized ascending aorta or aortic arch. 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 The visualized proximal subclavian arteries are normal. Right carotid system: The right common carotid origin is widely patent. There is no common carotid or internal carotid artery dissection or aneurysm. No hemodynamically significant stenosis. Left carotid system: The left common carotid origin is widely patent. 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 There is reconstitution at the communicating segment of the intracranial left ICA. Vertebral arteries: The vertebral system is right dominant. The right vertebral artery arises from the right common carotid artery. There is moderate atherosclerotic calcification within the proximal right V1 segment. The left vertebral artery arises from the left subclavian artery without origin stenosis. Both vertebral arteries are normal to their confluence with the basilar artery. Skeleton: There is no bony spinal canal stenosis. No lytic or blastic lesions. Other neck: The nasopharynx is clear. The oropharynx and hypopharynx are normal. The epiglottis is normal. The supraglottic larynx, glottis and subglottic larynx are normal. No retropharyngeal collection. The parapharyngeal spaces are preserved. The parotid and submandibular glands are normal. No sialolithiasis or salivary ductal dilatation. The thyroid gland is normal. There is no cervical lymphadenopathy. Upper chest: There are multiple enlarged upper mediastinal lymph nodes, measuring up to 2.0 cm at right level 4. There is incompletely visualized distortion within the anterior left upper lobe, likely scarring. Review of the MIP images confirms the above findings CTA HEAD FINDINGS Anterior  circulation: --Intracranial internal carotid arteries: The proximal intracranial left internal carotid artery is occluded. There is opacification of the communicating segment via collateral or retrograde flow. There is atherosclerotic calcification of the right ICA lacerum, cavernous and clinoid segments without significant stenosis. --Anterior cerebral arteries: Normal. --Middle cerebral arteries: Normal. --Posterior communicating arteries: Present on the right. Not seen on the left. Posterior circulation: --Posterior cerebral arteries: Normal. --Superior cerebellar arteries: Normal. --Basilar artery: Normal. --Anterior inferior cerebellar arteries: Normal. --Posterior inferior cerebellar arteries: Normal. Venous sinuses: As permitted by contrast timing, patent. Anatomic variants: None Delayed phase: Not performed. Review of the MIP images confirms the above findings IMPRESSION: 1. 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 2. No other intracranial arterial occlusion or high-grade stenosis. 3. Multiple enlarged upper mediastinal lymph nodes measuring up to 2 cm. These are concerning for a lymphoproliferative process or lymphatic spread of malignancy from an unidentified source. 4. 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 Electronically Signed   By: Ulyses Jarred M.D.   On: 02/14/2017 05:10   Ct Angio Neck W Or Wo Contrast  Result Date: 02/14/2017 CLINICAL DATA:  Left-sided facial pain EXAM: CT ANGIOGRAPHY HEAD AND NECK TECHNIQUE: Multidetector CT imaging of the head and neck was performed using the standard protocol during bolus administration of intravenous contrast. Multiplanar CT image reconstructions and MIPs were obtained to evaluate the vascular anatomy. Carotid stenosis measurements (when applicable) are obtained utilizing  NASCET criteria, using the distal internal carotid diameter as the denominator. CONTRAST:  50 mL Isovue 370 IV COMPARISON:  Same day head CT FINDINGS: CTA NECK FINDINGS Aortic arch: There is no  aneurysm or dissection of the visualized ascending aorta or aortic arch. 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 The visualized proximal subclavian arteries are normal. Right carotid system: The right common carotid origin is widely patent. There is no common carotid or internal carotid artery dissection or aneurysm. No hemodynamically significant stenosis. Left carotid system: The left common carotid origin is widely patent. 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 There is reconstitution at the communicating segment of the intracranial left ICA. Vertebral arteries: The vertebral system is right dominant. The right vertebral artery arises from the right common carotid artery. There is moderate atherosclerotic calcification within the proximal right V1 segment. The left vertebral artery arises from the left subclavian artery without origin stenosis. Both vertebral arteries are normal to their confluence with the basilar artery. Skeleton: There is no bony spinal canal stenosis. No lytic or blastic lesions. Other neck: The nasopharynx is clear. The oropharynx and hypopharynx are normal. The epiglottis is normal. The supraglottic larynx, glottis and subglottic larynx are normal. No retropharyngeal collection. The parapharyngeal spaces are preserved. The parotid and submandibular glands are normal. No sialolithiasis or salivary ductal dilatation. The thyroid gland is normal. There is no cervical lymphadenopathy. Upper chest: There are multiple enlarged upper mediastinal lymph nodes, measuring up to 2.0 cm at right level 4. There is incompletely visualized distortion within the anterior left upper lobe, likely scarring. Review of  the MIP images confirms the above findings CTA HEAD FINDINGS Anterior circulation: --Intracranial internal carotid arteries: The proximal intracranial left internal carotid artery is occluded. There is opacification of the communicating segment via collateral or retrograde flow. There is atherosclerotic calcification of the right ICA lacerum, cavernous and clinoid segments without significant stenosis. --Anterior cerebral arteries: Normal. --Middle cerebral arteries: Normal. --Posterior communicating arteries: Present on the right. Not seen on the left. Posterior circulation: --Posterior cerebral arteries: Normal. --Superior cerebellar arteries: Normal. --Basilar artery: Normal. --Anterior inferior cerebellar arteries: Normal. --Posterior inferior cerebellar arteries: Normal. Venous sinuses: As permitted by contrast timing, patent. Anatomic variants: None Delayed phase: Not performed. Review of the MIP images confirms the above findings IMPRESSION: 1. 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 2. No other intracranial arterial occlusion or high-grade stenosis. 3. Multiple enlarged upper mediastinal lymph nodes measuring up to 2 cm. These are concerning for a lymphoproliferative process or lymphatic spread of malignancy from an unidentified source. 4. 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 Electronically Signed   By: Ulyses Jarred M.D.   On: 02/14/2017 05:10   Ct Head Code Stroke W/o Cm  Result Date: 02/14/2017 CLINICAL DATA:  Code stroke.  Dizziness and left facial pain EXAM: CT HEAD WITHOUT CONTRAST TECHNIQUE: Contiguous axial images were obtained from the base of the skull through the vertex without intravenous contrast. COMPARISON:  None. FINDINGS: Brain: No mass lesion, intraparenchymal hemorrhage or extra-axial collection. No  evidence of acute cortical infarct. There is an old lacunar infarct in the left thalamus. Vascular: No hyperdense vessel or unexpected calcification. Skull: Normal visualized skull base, calvarium and extracranial soft tissues. Sinuses/Orbits: No sinus fluid levels or advanced mucosal thickening. No mastoid effusion. Normal orbits. ASPECTS [facility] Stroke Program Early CT Score) - Ganglionic level infarction (caudate, lentiform nuclei, internal capsule, insula, M1-M3 cortex): 7 - Supraganglionic infarction (M4-M6 cortex): 3 Total score (0-10  with 10 being normal): 10 IMPRESSION: 1. No acute intracranial abnormality. 2. 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 3. ASPECTS is 10. These results were called by telephone at the time of interpretation on 02/14/2017 at 4:50 am to Dr. Leonel Ramsay, who verbally acknowledged these results. Electronically Signed   By: Ulyses Jarred M.D.   On: 02/14/2017 04:50    Procedures Procedures (including critical care time) CRITICAL CARE Performed by: QUIVH,[email or web address] Total critical care time: 50 minutes Critical care time was exclusive of separately billable procedures and treating other patients. Critical care was necessary to treat or prevent imminent or life-threatening deterioration. Critical care was time spent personally by me on the following activities: development of treatment plan with patient and/or surrogate as well as nursing, discussions with consultants, evaluation of patient's response to treatment, examination of patient, obtaining history from patient or surrogate, ordering and performing treatments and interventions, ordering and review of laboratory studies, ordering and review of radiographic studies, pulse oximetry and re-evaluation of patient's condition.  Medications Ordered in ED Medications  ondansetron (ZOFRAN) 4 MG/2ML injection (not administered)  ondansetron (ZOFRAN) injection 4 mg (not administered)  tetracaine (PONTOCAINE) 0.5 % ophthalmic solution 2  drop (not administered)  iopamidol (ISOVUE-370) 76 % injection 50 mL (50 mLs Intravenous Contrast Given 02/14/17 0448)     Initial Impression / Assessment and Plan / ED Course  I have reviewed the triage vital signs and the nursing notes.  Pertinent labs & imaging results that were available during my care of the patient were reviewed by me and considered in my medical decision making (see chart for details).  Vertigo and facial pain and blurred vision. This certainly could be related to stroke. Electrolytes show severe hypokalemia and she is started on potassium replacement. She was seen in conjunction with Dr. Leonel Ramsay enterology service, and case has been discussed with him. Will check ocular pressures to make sure that she does not have acute angle closure glaucoma. She is given potassium replacement intravenously and orally. She will need to be admitted for further workup.  Ocular pressures were checked using Tono-Pen. Left eye pressure is 18, right eye pressure 25. Right eye pressure is mildly elevated, but not enough to account for symptoms. Case is discussed with Dr. Aggie Moats of [facility], who agrees to admit the patient.  Final Clinical Impressions(s) / ED Diagnoses   Final diagnoses:  Vertigo  Facial pain  Hypokalemia    New Prescriptions New Prescriptions   No medications on file     Delora Fuel, MD 42/76/70 1100

## 2017-02-14 NOTE — ED Notes (Signed)
RN received critical K+ 2.2; Leonel Ramsay MD informed

## 2017-02-14 NOTE — Progress Notes (Signed)
received critical lab value on potassium of 2.5.  Notified on call Dr. Reesa Chew

## 2017-02-14 NOTE — ED Notes (Signed)
Pt remains out of room for MRI

## 2017-02-15 DIAGNOSIS — R2990 Unspecified symptoms and signs involving the nervous system: Secondary | ICD-10-CM

## 2017-02-15 DIAGNOSIS — I10 Essential (primary) hypertension: Secondary | ICD-10-CM

## 2017-02-15 LAB — LIPID PANEL
CHOL/HDL RATIO: 4.1 ratio
CHOLESTEROL: 139 mg/dL (ref 0–200)
HDL: 34 mg/dL — AB (ref >40)
LDL Cholesterol: 80 mg/dL (ref 0–99)
TRIGLYCERIDES: 124 mg/dL (ref <150)
VLDL: 25 mg/dL (ref 0–40)

## 2017-02-15 LAB — CBC
HCT: 33.6 % — ABNORMAL LOW (ref 36.0–46.0)
Hemoglobin: 11.4 g/dL — ABNORMAL LOW (ref 12.0–15.0)
MCH: 28.3 pg (ref 26.0–34.0)
MCHC: 33.9 g/dL (ref 30.0–36.0)
MCV: 83.4 fL (ref 78.0–100.0)
PLATELETS: 206 10*3/uL (ref 150–400)
RBC: 4.03 MIL/uL (ref 3.87–5.11)
RDW: 16 % — AB (ref 11.5–15.5)
WBC: 6.8 10*3/uL (ref 4.0–10.5)

## 2017-02-15 LAB — BASIC METABOLIC PANEL
Anion gap: 6 (ref 5–15)
BUN: 8 mg/dL (ref 6–20)
CALCIUM: 9.3 mg/dL (ref 8.9–10.3)
CHLORIDE: 117 mmol/L — AB (ref 101–111)
CO2: 16 mmol/L — ABNORMAL LOW (ref 22–32)
CREATININE: 0.58 mg/dL (ref 0.44–1.00)
Glucose, Bld: 128 mg/dL — ABNORMAL HIGH (ref 65–99)
Potassium: 2.7 mmol/L — CL (ref 3.5–5.1)
SODIUM: 139 mmol/L (ref 135–145)

## 2017-02-15 LAB — MAGNESIUM: Magnesium: 1.8 mg/dL (ref 1.7–2.4)

## 2017-02-15 MED ORDER — NICOTINE POLACRILEX 2 MG MT GUM
4.0000 mg | CHEWING_GUM | OROMUCOSAL | Status: DC
  Administered 2017-02-15 – 2017-02-18 (×7): 4 mg via ORAL
  Filled 2017-02-15 (×16): qty 2

## 2017-02-15 MED ORDER — POTASSIUM CHLORIDE CRYS ER 20 MEQ PO TBCR
40.0000 meq | EXTENDED_RELEASE_TABLET | Freq: Once | ORAL | Status: AC
  Administered 2017-02-15: 40 meq via ORAL
  Filled 2017-02-15: qty 2

## 2017-02-15 MED ORDER — ASPIRIN EC 325 MG PO TBEC
325.0000 mg | DELAYED_RELEASE_TABLET | Freq: Every day | ORAL | Status: DC
  Administered 2017-02-15 – 2017-02-18 (×4): 325 mg via ORAL
  Filled 2017-02-15 (×4): qty 1

## 2017-02-15 MED ORDER — ALBUTEROL SULFATE (2.5 MG/3ML) 0.083% IN NEBU
2.5000 mg | INHALATION_SOLUTION | Freq: Four times a day (QID) | RESPIRATORY_TRACT | Status: DC | PRN
  Administered 2017-02-15: 2.5 mg via RESPIRATORY_TRACT
  Filled 2017-02-15: qty 3

## 2017-02-15 MED ORDER — ATORVASTATIN CALCIUM 10 MG PO TABS
20.0000 mg | ORAL_TABLET | Freq: Every day | ORAL | Status: DC
  Administered 2017-02-15 – 2017-02-17 (×3): 20 mg via ORAL
  Filled 2017-02-15 (×3): qty 2

## 2017-02-15 NOTE — Evaluation (Signed)
Occupational Therapy Evaluation Patient Details Name: Faith [name] MRN: [id number] DOB: Oct 27, 1951 Today's Date: 02/15/2017    History of Present Illness Pt is a 66 y/o female with a PMH significant for PVD, HTN, schizophrenia, recurrent bronchitis. 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   Clinical Impression   PTA, pt was utilizing a cane or walker during functional mobility and ADL. She has an aide who stays with her during the night but is able to participate in majority of ADL during the day on her own. She presents with L visual field deficits, disconjugate gaze, B UE weakness (L>R) and decreased cognition impacting her ability to participate in ADL at [facility]. Feel pt would benefit from CIR level therapies post-acute D/C in order to maximize independence with ADL and return to PLOF. OT will continue to follow acutely to improve independence with ADL and functional mobility.    Follow Up Recommendations  CIR    Equipment Recommendations  Other (comment) (TBD at next venue of care)    Recommendations for Other Services       Precautions / Restrictions Precautions Precautions: Fall Restrictions Weight Bearing Restrictions: No      Mobility Bed Mobility Overal bed mobility: Needs Assistance Bed Mobility: Sit to Supine     Supine to sit: Min guard Sit to supine: Min guard   General bed mobility comments: Min guard for safety.  Transfers Overall transfer level: Needs assistance Equipment used: Rolling walker (2 wheeled) Transfers: Sit to/from Stand Sit to Stand: Min assist         General transfer comment: Steadying assist.     Balance Overall balance assessment: Needs assistance Sitting-balance support: Feet supported;No upper extremity supported Sitting balance-Leahy Scale: Fair     Standing  balance support: During functional activity;Bilateral upper extremity supported Standing balance-Leahy Scale: Poor Standing balance comment: Requires UE support                            ADL Overall ADL's : Needs assistance/impaired Eating/Feeding: Set up;Sitting   Grooming: Set up;Sitting   Upper Body Bathing: Minimal assistance;Sitting   Lower Body Bathing: Minimal assistance;Sit to/from stand   Upper Body Dressing : Minimal assistance;Sitting   Lower Body Dressing: Minimal assistance;Sit to/from stand   Toilet Transfer: Minimal assistance;Moderate assistance;Ambulation;BSC;RW Toilet Transfer Details (indicate cue type and reason): Fatigues easily Toileting- Clothing Manipulation and Hygiene: Minimal assistance;Sit to/from stand       Functional mobility during ADLs: Minimal assistance;Moderate assistance;Rolling walker General ADL Comments: Pt with significantly decreased activity tolerance for ADL.      Vision Patient Visual Report: Other (comment);Blurring of vision (Decreased vision in L eye) Vision Assessment?: Vision impaired- to be further tested in functional context;Yes Eye Alignment: Impaired (comment) (disconjugate gaze ) Ocular Range of Motion: Within Functional Limits Alignment/Gaze Preference: Within Defined Limits Tracking/Visual Pursuits: Decreased smoothness of horizontal tracking;Decreased smoothness of vertical tracking Visual Fields: Left visual field deficit Additional Comments: Pt with decreased smoothness of tracking. Feel likely has L visual field deficit. Reports blurred vision in L eye as well.     Perception     Praxis      Pertinent Vitals/Pain Pain Assessment: 0-10 Pain Score: 7  Pain Location: RLE Pain Descriptors / Indicators: Sore Pain Intervention(s): Monitored during session     Hand Dominance Right  Extremity/Trunk Assessment Upper Extremity Assessment Upper Extremity Assessment: Generalized weakness;LUE  deficits/detail RUE Deficits / Details: 4/5 grossly LUE Deficits / Details: Decreased strength as compared to R. 3+/5 grossly.   Lower Extremity Assessment Lower Extremity Assessment: Defer to PT evaluation RLE Deficits / Details: Decreased strength and general guarding consistent with fem-pop last month RLE: Unable to fully assess due to pain   Cervical / Trunk Assessment Cervical / Trunk Assessment: Other exceptions Cervical / Trunk Exceptions: Forward head/rounded shoulders posture   Communication Communication Communication: No difficulties   Cognition Arousal/Alertness: Awake/alert Behavior During Therapy: WFL for tasks assessed/performed Overall Cognitive Status: No family/caregiver present to determine baseline cognitive functioning Area of Impairment: Attention;Memory;Awareness;Safety/judgement;Problem solving   Current Attention Level: Sustained Memory: Decreased short-term memory   Safety/Judgement: Decreased awareness of safety Awareness: Emergent Problem Solving: Slow processing;Difficulty sequencing General Comments: Family present during session however gave no indication of baseline and gave very minimal information when directly asked about PLOF.    General Comments       Exercises       Shoulder Instructions      Home Living Family/patient expects to be discharged to:: Private residence Living Arrangements: Alone Available Help at Discharge: Family;Personal care attendant;Available PRN/intermittently (personal care attendant at night only) Type of Home: Apartment Home Access: Stairs to enter Entrance Stairs-Number of Steps: 10 (5 and 5) Entrance Stairs-Rails: Right Home Layout: One level     Bathroom Shower/Tub: Tub/shower unit Shower/tub characteristics: Curtain Biochemist, clinical: Standard     Home Equipment: Cane - single point;Walker - 2 wheels          Prior Functioning/Environment Level of Independence: Needs assistance  Gait / Transfers  Assistance Needed: Used the cane or walker depending on how she was feeling that day ADL's / Homemaking Assistance Needed: Reports she cooks minimally and "sometimes" has assist for LB dressing and bathing from personal care attendant.            OT Problem List: Decreased strength;Decreased range of motion;Decreased activity tolerance;Impaired balance (sitting and/or standing);Decreased safety awareness;Decreased knowledge of use of DME or AE;Decreased knowledge of precautions;Pain;Impaired UE functional use;Decreased cognition;Impaired vision/perception      OT Treatment/Interventions: Self-care/ADL training;Neuromuscular education;DME and/or AE instruction;Therapeutic activities;Cognitive remediation/compensation;Patient/family education;Balance training;Visual/perceptual remediation/compensation    OT Goals(Current goals can be found in the care plan section) Acute Rehab OT Goals Patient Stated Goal: Return home when able OT Goal Formulation: With patient Time For Goal Achievement: 03/01/17 Potential to Achieve Goals: Good ADL Goals Pt Will Perform Lower Body Bathing: with modified independence;sit to/from stand Pt Will Perform Lower Body Dressing: with modified independence;sit to/from stand Pt Will Transfer to Toilet: with modified independence;ambulating;bedside commode (BSC over toilet) Pt Will Perform Toileting - Clothing Manipulation and hygiene: with modified independence;sit to/from stand Pt/caregiver will Perform Home Exercise Program: Increased strength;Left upper extremity;With Supervision;With written HEP provided Additional ADL Goal #1: Pt will demonstrate anticipatory awareness during morning ADL routine in order to improve safety with daily self-care tasks.  OT Frequency: Min 3X/week   Barriers to D/C:            Co-evaluation              End of Session Equipment Utilized During Treatment: Rolling walker;Gait belt Nurse Communication: Mobility  status  Activity Tolerance: Patient tolerated treatment well Patient left: in bed;with call bell/phone within reach;with bed alarm set  OT Visit Diagnosis: Unsteadiness on feet (R26.81);Muscle weakness (generalized) (M62.81)  ADL either performed or assessed with clinical judgement  Time: 1412-1429 OT Time Calculation (min): 17 min Charges:  OT General Charges $OT Visit: 1 Procedure OT Evaluation $OT Eval Moderate Complexity: 1 Procedure G-Codes:     Norman Herrlich, MS OTR/L  Pager: [place] A Laurelle Skiver 02/15/2017, 4:32 PM

## 2017-02-15 NOTE — Progress Notes (Signed)
PROGRESS NOTE    Faith [name]  IHK:[id number] DOB: 12/16/1950 DOA: 02/14/2017 PCP: Philis Fendt, MD   Brief Narrative: 66 y.o. female  with past medical history significant for peripheral vascular disease, recurrent bronchitis, hypertension, schizophrenia, reflux presents emergency room with chief complaint of strokelike symptoms. Patient states that she's been in normal state of health for the last week. Acutely after. So, with the last 12 hours patient began to have symptoms of spinning, visual disturbance and left-sided facial pain. This alarmed the patient and she asked a friend activate EMS. Patient has no history of head trauma or stroke.  Assessment & Plan:   Principal Problem:   Stroke-like symptom - stroke work up underway - neurology on board  Active Problems:   HTN (hypertension) - allow permissive hypertension while undergoing work up    Depression - stable currently    Nonhealing nonsurgical wound - continue wound care consult    Low potassium syndrome - continue replacement, today will add k dur 40 meq orally - assess magnesium level   DVT prophylaxis: SCD's Code Status: Full Family Communication: d/c patient dirrectly Disposition Plan: pending improvement in condition   Consultants:   Neurology   Procedures: none   Antimicrobials: None   Subjective: Pt has no new complaints. No acute issues overnight.  Objective: Vitals:   02/15/17 0459 02/15/17 0500 02/15/17 0900 02/15/17 0959  BP: (!) 153/53   (!) 139/58  Pulse: 62   71  Resp: 16   16  Temp: 98.4 F (36.9 C)   97.7 F (36.5 C)  TempSrc: Oral   Oral  SpO2: 97%   100%  Weight:  64.9 kg (143 lb 1.6 oz) 65.3 kg (144 lb)   Height:   '5\' 1"'$  (1.549 m)    No intake or output data in the 24 hours ending 02/15/17 1304 Filed Weights   02/14/17 0448 02/15/17 0500 02/15/17 0900  Weight: 67.1 kg (147 lb 14.9 oz) 64.9 kg (143 lb 1.6 oz) 65.3 kg (144 lb)    Examination:  General exam:  Appears calm and comfortable, in nad. Respiratory system: Clear to auscultation. Respiratory effort normal. Cardiovascular system: S1 & S2 heard, RRR.  Gastrointestinal system: Abdomen is nondistended, soft and nontender. No organomegaly or masses felt. Normal bowel sounds heard. Central nervous system: Alert and oriented. No focal neurological deficits. Extremities: no cyanosis, pulses present Skin: No rashes, warm and dry Psychiatry: . Mood & affect appropriate.     Data Reviewed: I have personally reviewed following labs and imaging studies  CBC:  Recent Labs Lab 02/14/17 0420 02/14/17 0429 02/15/17 0609  WBC 12.1*  --  6.8  NEUTROABS 5.2  --   --   HGB 12.2 12.9 11.4*  HCT 36.5 38.0 33.6*  MCV 84.3  --  83.4  PLT 220  --  756   Basic Metabolic Panel:  Recent Labs Lab 02/14/17 0420 02/14/17 0425 02/14/17 0429 02/14/17 1849 02/15/17 0609 02/15/17 0610  NA 142  --  146* 134* 139  --   K 2.2*  --  2.2* 2.5* 2.7*  --   CL 117*  --  117* 110 117*  --   CO2 15*  --   --  16* 16*  --   GLUCOSE 167*  --  160* 120* 128*  --   BUN 15  --  '17 11 8  '$ --   CREATININE 0.66  --  0.60 0.55 0.58  --   CALCIUM 9.4  --   --  9.0 9.3  --   MG  --  2.1  --   --   --  1.8  PHOS  --  2.9  --   --   --   --    GFR: Estimated Creatinine Clearance: 61.5 mL/min (by C-G formula based on SCr of 0.58 mg/dL). Liver Function Tests:  Recent Labs Lab 02/14/17 0420  AST 16  ALT 11*  ALKPHOS 116  BILITOT 0.4  PROT 7.0  ALBUMIN 3.6   No results for input(s): LIPASE, AMYLASE in the last 168 hours. No results for input(s): AMMONIA in the last 168 hours. Coagulation Profile:  Recent Labs Lab 02/14/17 0420  INR 1.08   Cardiac Enzymes: No results for input(s): CKTOTAL, CKMB, CKMBINDEX, TROPONINI in the last 168 hours. BNP (last 3 results) No results for input(s): PROBNP in the last 8760 hours. HbA1C: No results for input(s): HGBA1C in the last 72 hours. CBG:  Recent Labs Lab  02/14/17 0422  GLUCAP 167*   Lipid Profile:  Recent Labs  02/15/17 0610  CHOL 139  HDL 34*  LDLCALC 80  TRIG 124  CHOLHDL 4.1   Thyroid Function Tests: No results for input(s): TSH, T4TOTAL, FREET4, T3FREE, THYROIDAB in the last 72 hours. Anemia Panel: No results for input(s): VITAMINB12, FOLATE, FERRITIN, TIBC, IRON, RETICCTPCT in the last 72 hours. Sepsis Labs: No results for input(s): PROCALCITON, LATICACIDVEN in the last 168 hours.  No results found for this or any previous visit (from the past 240 hour(s)).       Radiology Studies: Ct Angio Head W Or Wo Contrast  Result Date: 02/14/2017 CLINICAL DATA:  Left-sided facial pain EXAM: CT ANGIOGRAPHY HEAD AND NECK TECHNIQUE: Multidetector CT imaging of the head and neck was performed using the standard protocol during bolus administration of intravenous contrast. Multiplanar CT image reconstructions and MIPs were obtained to evaluate the vascular anatomy. Carotid stenosis measurements (when applicable) are obtained utilizing NASCET criteria, using the distal internal carotid diameter as the denominator. CONTRAST:  50 mL Isovue 370 IV COMPARISON:  Same day head CT FINDINGS: CTA NECK FINDINGS Aortic arch: There is no aneurysm or dissection of the visualized ascending aorta or aortic arch. 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 The visualized proximal subclavian arteries are normal. Right carotid system: The right common carotid origin is widely patent. There is no common carotid or internal carotid artery dissection or aneurysm. No hemodynamically significant stenosis. Left carotid system: The left common carotid origin is widely patent. 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 There is reconstitution at the communicating segment of the intracranial left ICA. Vertebral arteries: The vertebral system is right dominant. The right vertebral  artery arises from the right common carotid artery. There is moderate atherosclerotic calcification within the proximal right V1 segment. The left vertebral artery arises from the left subclavian artery without origin stenosis. Both vertebral arteries are normal to their confluence with the basilar artery. Skeleton: There is no bony spinal canal stenosis. No lytic or blastic lesions. Other neck: The nasopharynx is clear. The oropharynx and hypopharynx are normal. The epiglottis is normal. The supraglottic larynx, glottis and subglottic larynx are normal. No retropharyngeal collection. The parapharyngeal spaces are preserved. The parotid and submandibular glands are normal. No sialolithiasis or salivary ductal dilatation. The thyroid gland is normal. There is no cervical lymphadenopathy. Upper chest: There are multiple enlarged upper mediastinal lymph nodes, measuring up to 2.0 cm at right  level 4. There is incompletely visualized distortion within the anterior left upper lobe, likely scarring. Review of the MIP images confirms the above findings CTA HEAD FINDINGS Anterior circulation: --Intracranial internal carotid arteries: The proximal intracranial left internal carotid artery is occluded. There is opacification of the communicating segment via collateral or retrograde flow. There is atherosclerotic calcification of the right ICA lacerum, cavernous and clinoid segments without significant stenosis. --Anterior cerebral arteries: Normal. --Middle cerebral arteries: Normal. --Posterior communicating arteries: Present on the right. Not seen on the left. Posterior circulation: --Posterior cerebral arteries: Normal. --Superior cerebellar arteries: Normal. --Basilar artery: Normal. --Anterior inferior cerebellar arteries: Normal. --Posterior inferior cerebellar arteries: Normal. Venous sinuses: As permitted by contrast timing, patent. Anatomic variants: None Delayed phase: Not performed. Review of the MIP images  confirms the above findings IMPRESSION: 1. 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 2. No other intracranial arterial occlusion or high-grade stenosis. 3. Multiple enlarged upper mediastinal lymph nodes measuring up to 2 cm. These are concerning for a lymphoproliferative process or lymphatic spread of malignancy from an unidentified source. 4. 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 Electronically Signed   By: Ulyses Jarred M.D.   On: 02/14/2017 05:10   Ct Angio Neck W Or Wo Contrast  Result Date: 02/14/2017 CLINICAL DATA:  Left-sided facial pain EXAM: CT ANGIOGRAPHY HEAD AND NECK TECHNIQUE: Multidetector CT imaging of the head and neck was performed using the standard protocol during bolus administration of intravenous contrast. Multiplanar CT image reconstructions and MIPs were obtained to evaluate the vascular anatomy. Carotid stenosis measurements (when applicable) are obtained utilizing NASCET criteria, using the distal internal carotid diameter as the denominator. CONTRAST:  50 mL Isovue 370 IV COMPARISON:  Same day head CT FINDINGS: CTA NECK FINDINGS Aortic arch: There is no aneurysm or dissection of the visualized ascending aorta or aortic arch. 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 The visualized proximal subclavian arteries are normal. Right carotid system: The right common carotid origin is widely patent. There is no common carotid or internal carotid artery dissection or aneurysm. No hemodynamically significant stenosis. Left carotid system: The left common carotid origin is widely patent. 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 There is reconstitution at the communicating segment of the intracranial left ICA.  Vertebral arteries: The vertebral system is right dominant. The right vertebral artery arises from the right common carotid artery. There is moderate atherosclerotic calcification within the proximal right V1 segment. The left vertebral artery arises from the left subclavian artery without origin stenosis. Both vertebral arteries are normal to their confluence with the basilar artery. Skeleton: There is no bony spinal canal stenosis. No lytic or blastic lesions. Other neck: The nasopharynx is clear. The oropharynx and hypopharynx are normal. The epiglottis is normal. The supraglottic larynx, glottis and subglottic larynx are normal. No retropharyngeal collection. The parapharyngeal spaces are preserved. The parotid and submandibular glands are normal. No sialolithiasis or salivary ductal dilatation. The thyroid gland is normal. There is no cervical lymphadenopathy. Upper chest: There are multiple enlarged upper mediastinal lymph nodes, measuring up to 2.0 cm at right level 4. There is incompletely visualized distortion within the anterior left upper lobe, likely scarring. Review of the MIP images confirms the above findings CTA HEAD FINDINGS Anterior circulation: --Intracranial internal carotid arteries: The proximal  intracranial left internal carotid artery is occluded. There is opacification of the communicating segment via collateral or retrograde flow. There is atherosclerotic calcification of the right ICA lacerum, cavernous and clinoid segments without significant stenosis. --Anterior cerebral arteries: Normal. --Middle cerebral arteries: Normal. --Posterior communicating arteries: Present on the right. Not seen on the left. Posterior circulation: --Posterior cerebral arteries: Normal. --Superior cerebellar arteries: Normal. --Basilar artery: Normal. --Anterior inferior cerebellar arteries: Normal. --Posterior inferior cerebellar arteries: Normal. Venous sinuses: As permitted by contrast timing, patent.  Anatomic variants: None Delayed phase: Not performed. Review of the MIP images confirms the above findings IMPRESSION: 1. 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 2. No other intracranial arterial occlusion or high-grade stenosis. 3. Multiple enlarged upper mediastinal lymph nodes measuring up to 2 cm. These are concerning for a lymphoproliferative process or lymphatic spread of malignancy from an unidentified source. 4. 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 Electronically Signed   By: Ulyses Jarred M.D.   On: 02/14/2017 05:10   Ct Chest Wo Contrast  Result Date: 02/14/2017 CLINICAL DATA:  Mediastinal adenopathy on recent neck CT. EXAM: CT CHEST WITHOUT CONTRAST TECHNIQUE: Multidetector CT imaging of the chest was performed following the standard protocol without IV contrast. COMPARISON:  Chest CT 12/13/2014 and 05/22/2014 FINDINGS: Cardiovascular: Heart is normal size. There is calcified plaque over the right coronary and left lateral circumflex coronary arteries. Calcified plaque over the thoracic aorta. Aberrant right subclavian artery. Mediastinum/Nodes: There is evidence of adenopathy over the visualized right neck base with several necrotic nodes over the right neck base/ cervical chain on recent CT. Moderate superior mediastinal/mediastinal adenopathy with the largest node over the right peritracheal region measuring 2.4 cm by short axis. Suggestion mild subcarinal adenopathy. No significant axillary adenopathy. Remaining mediastinal structures are unremarkable. Lungs/Pleura: Lungs are adequately inflated. No focal airspace consolidation or effusion. There are a few small subpleural areas of scarring unchanged. Decrease in size of a small cavitary focus over the lateral left midlung. Stable 3 mm nodule over the left lower  lobe. Previously noted 1.6 cm nodule in the right infrahilar region appears smaller in harder to define, although there are 2 small nodules just below this region which were not seen previously measuring 3-4 mm and 6-7 mm respectively. Airways are within normal. Upper Abdomen: Previous cholecystectomy. Stable 1.5 cm cyst over the left lobe of the liver. Calcified plaque over the abdominal aorta. Contrast within the renal collecting systems. Musculoskeletal: Degenerate change of the spine. IMPRESSION: Moderate mediastinal adenopathy as described with the largest lymph node in the right peritracheal region measuring 2.4 cm by short axis. There is also adenopathy over the right neck base with necrotic nodes over the right cervical chain on recent neck CT. Findings likely represent a neoplastic process as consider tissue sampling of 1 of the lower right cervical chain nodes. Few small pulmonary nodules as described some improved and some new. Most likely due to waxing and waning inflammatory process. In light of the above described adenopathy, metastatic disease is not excluded. Largest nodule is present over the right lower lobe inferior to the hilum measuring 6-7 mm. Recommend followup CT 3 months. Aortic atherosclerosis.  Atherosclerotic coronary artery disease. 1.5 cm liver cyst unchanged. Electronically Signed   By: Marin Olp M.D.   On: 02/14/2017 10:22   Mr Brain Wo Contrast  Result Date: 02/14/2017  CLINICAL DATA:  Acute presentation with vertigo and visual disturbance. Left facial pain. EXAM: MRI HEAD WITHOUT CONTRAST TECHNIQUE: Multiplanar, multiecho pulse sequences of the brain and surrounding structures were obtained without intravenous contrast. COMPARISON:  Multiple CT examinations earlier same day. FINDINGS: Brain: Diffusion imaging shows multiple) approximately 10 punctate foci of restricted diffusion consistent with micro embolic or small watershed infarctions in the left MCA territory affecting  the left frontal and parietal cortical and subcortical brain and the deep white matter adjacent to the left lateral ventricle in the left caudate. No large or confluent infarction. The brainstem and cerebellum are normal. There are old lacunar thalamic infarctions. There are old small vessel infarctions in the basal ganglia and cerebral hemispheric white matter. No large vessel territory infarction. No mass lesion, hemorrhage, hydrocephalus or extra-axial collection. Vascular: No antegrade flow in the left cervical internal carotid artery. Skull and upper cervical spine: Chronic C1-2 degenerative changes with spinal stenosis and sub cord deformity. Sinuses/Orbits: Negative Other: None significant IMPRESSION: Scattered punctate acute infarctions in the left MCA territory which could be micro embolic infarctions or watershed infarctions. These are present in the left frontal and parietal cortical and subcortical brain, the left caudate and the left deep white matter. No large or confluent infarction. No antegrade flow in the distal left cervical ICA as previously demonstrated at CT angiography. Old small vessel infarctions affecting the thalami, basal ganglia and cerebral hemispheric white matter. Electronically Signed   By: Nelson Chimes M.D.   On: 02/14/2017 10:46   Ct Head Code Stroke W/o Cm  Result Date: 02/14/2017 CLINICAL DATA:  Code stroke.  Dizziness and left facial pain EXAM: CT HEAD WITHOUT CONTRAST TECHNIQUE: Contiguous axial images were obtained from the base of the skull through the vertex without intravenous contrast. COMPARISON:  None. FINDINGS: Brain: No mass lesion, intraparenchymal hemorrhage or extra-axial collection. No evidence of acute cortical infarct. There is an old lacunar infarct in the left thalamus. Vascular: No hyperdense vessel or unexpected calcification. Skull: Normal visualized skull base, calvarium and extracranial soft tissues. Sinuses/Orbits: No sinus fluid levels or advanced  mucosal thickening. No mastoid effusion. Normal orbits. ASPECTS [facility] Stroke Program Early CT Score) - Ganglionic level infarction (caudate, lentiform nuclei, internal capsule, insula, M1-M3 cortex): 7 - Supraganglionic infarction (M4-M6 cortex): 3 Total score (0-10 with 10 being normal): 10 IMPRESSION: 1. No acute intracranial abnormality. 2. 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 3. ASPECTS is 10. These results were called by telephone at the time of interpretation on 02/14/2017 at 4:50 am to Dr. Leonel Ramsay, who verbally acknowledged these results. Electronically Signed   By: Ulyses Jarred M.D.   On: 02/14/2017 04:50    Scheduled Meds: . aspirin EC  325 mg Oral Daily  . atorvastatin  20 mg Oral q1800  . ondansetron (ZOFRAN) IV  4 mg Intravenous Once  . sodium chloride flush  3 mL Intravenous Q12H  . tetracaine  2 drop Both Eyes Once   Continuous Infusions:   LOS: 1 day    Time spent: > 35 minutes  Velvet Bathe, MD [facility] Pager [phone]  If 7PM-7AM, please contact night-coverage [email or web address] Password [email or web address] 02/15/2017, 1:04 PM

## 2017-02-15 NOTE — Progress Notes (Signed)
STROKE TEAM PROGRESS NOTE   HISTORY OF PRESENT ILLNESS (per record) Faith [name] is a 66 y.o. female with a history of peripheral artery disease who underwent a femoral bypass last month. She was in her normal state of health on laying down around 11:30 PM, however on awakening shortly after 3 AM she was severely vertiginous. She had nausea and vomiting. She also has severe left eye pain and blurred vision.   SUBJECTIVE (INTERVAL HISTORY) Her husband and son and other family members were at the bedside.  Patient is lethargic but able to answer questions and follow some commands   OBJECTIVE Temp:  [97.7 F (36.5 C)-98.8 F (37.1 C)] 97.8 F (36.6 C) (03/18 1438) Pulse Rate:  [52-71] 52 (03/18 1438) Cardiac Rhythm: Sinus bradycardia (03/18 0700) Resp:  [15-18] 15 (03/18 1438) BP: (130-156)/(53-75) 145/57 (03/18 1438) SpO2:  [97 %-100 %] 100 % (03/18 1438) Weight:  [64.9 kg (143 lb 1.6 oz)-65.3 kg (144 lb)] 65.3 kg (144 lb) (03/18 0900)  CBC:   Recent Labs Lab 02/14/17 0420 02/14/17 0429 02/15/17 0609  WBC 12.1*  --  6.8  NEUTROABS 5.2  --   --   HGB 12.2 12.9 11.4*  HCT 36.5 38.0 33.6*  MCV 84.3  --  83.4  PLT 220  --  102    Basic Metabolic Panel:  Recent Labs Lab 02/14/17 0425  02/14/17 1849 02/15/17 0609 02/15/17 0610  NA  --   < > 134* 139  --   K  --   < > 2.5* 2.7*  --   CL  --   < > 110 117*  --   CO2  --   --  16* 16*  --   GLUCOSE  --   < > 120* 128*  --   BUN  --   < > 11 8  --   CREATININE  --   < > 0.55 0.58  --   CALCIUM  --   --  9.0 9.3  --   MG 2.1  --   --   --  1.8  PHOS 2.9  --   --   --   --   < > = values in this interval not displayed.  Lipid Panel:     Component Value Date/Time   CHOL 139 02/15/2017 0610   TRIG 124 02/15/2017 0610   HDL 34 (L) 02/15/2017 0610   CHOLHDL 4.1 02/15/2017 0610   VLDL 25 02/15/2017 0610   LDLCALC 80 02/15/2017 0610   HgbA1c: No results found for: HGBA1C Urine Drug Screen:     Component Value  Date/Time   LABOPIA NONE DETECTED 02/26/2014 1408   COCAINSCRNUR NONE DETECTED 02/26/2014 1408   LABBENZ NONE DETECTED 02/26/2014 1408   AMPHETMU NONE DETECTED 02/26/2014 1408   THCU POSITIVE (A) 02/26/2014 1408   LABBARB NONE DETECTED 02/26/2014 1408      IMAGING  Ct Angio Head and Neck W Or Wo Contrast 02/14/2017 1. 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  2. No other intracranial arterial occlusion or high-grade stenosis.  3. Multiple enlarged upper mediastinal lymph nodes measuring up to 2 cm. These are concerning for a lymphoproliferative process or lymphatic spread of malignancy from an unidentified source.  4. 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   Ct Chest Wo Contrast 02/14/2017 Moderate mediastinal adenopathy  as described with the largest lymph node in the right peritracheal region measuring 2.4 cm by short axis. There is also adenopathy over the right neck base with necrotic nodes over the right cervical chain on recent neck CT. Findings likely represent a neoplastic process as consider tissue sampling of 1 of the lower right cervical chain nodes. Few small pulmonary nodules as described some improved and some new. Most likely due to waxing and waning inflammatory process. In light of the above described adenopathy, metastatic disease is not excluded. Largest nodule is present over the right lower lobe inferior to the hilum measuring 6-7 mm. Recommend followup CT 3 months. Aortic atherosclerosis.  Atherosclerotic coronary artery disease. 1.5 cm liver cyst unchanged.   Mr Brain Wo Contrast 02/14/2017 Scattered punctate acute infarctions in the left MCA territory which could be micro embolic infarctions or watershed infarctions. These are present in the left frontal and parietal cortical and  subcortical brain, the left caudate and the left deep white matter. No large or confluent infarction. No antegrade flow in the distal left cervical ICA as previously demonstrated at CT angiography. Old small vessel infarctions affecting the thalami, basal ganglia and cerebral hemispheric white matter.   Ct Head Code Stroke W/o Cm 02/14/2017 1. No acute intracranial abnormality.  2. 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  3. ASPECTS is 10.    Physical Exam  Constitutional: Appears Elderly Psych: Affect appropriate to situation Eyes: No scleral injection HENT: sclera clear and nose and throat with dry membranes Head: Normocephalic.  Cardiovascular: Normal rate and regular rhythm.  Respiratory: Effort normal and breath sounds normal to anterior ascultation GI: Soft.  No distension. There is no tenderness.  Skin: WDI  Neuro: Mental Status: Patient is lethargic but follow commands No signs of aphasia or neglect; patient is dysarthric  Cranial Nerves: II: Visual Fields are full in the right eye. In the left eye she has hand waving vision. Pupils are equal, but there is an afferent pupillary defect on the left.  III,IV, VI: She has disconjugate gaze with the left eye down and out compared to the right. She has full extra ocular movements the right eye. She does have nystagmus  V: Facial sensation is symmetric to touch VII: Facial movement is symmetric.  VIII: hearing is intact to voice X: Uvula elevates symmetrically XI: Shoulder shrug is symmetric. XII: tongue is midline without atrophy or fasciculations.  Motor: Tone is normal. Bulk is normal. 5/5 strength was present in bilateral arms, she guards the right leg due to recent surgery but does appear to have good strength in it without drift. She is weaker in the legs as compared to arms.  Left leg easily achieves antigravity.  Right leg drifts to the bed Sensory: She does have some numbness of the right leg, which she states is baseline  since her surgery  Cerebellar: FNF intact bilaterally   ASSESSMENT/PLAN Faith [name] is a 66 y.o. female with history of schizophrenia, peripheral vascular disease, chronic bronchitis, hypertension, hyperlipidemia, and bipolar disorder presenting with severe left eye pain with blurred vision, vertigo, nausea and vomiting.  She did not receive IV t-PA due to unknown time of onset.  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   Resultant  Mild right sided weakness and dysarthria  MRI - Scattered punctate acute infarctions - left frontal and parietal cortical and subcortical brain, the left caudate and the left deep white matter  MRA - not  performed.  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   Carotid Doppler - CTA neck  2D Echo - pending  LDL - 80  HgbA1c - pending  VTE prophylaxis - SCDs Diet Heart Room service appropriate? Yes; Fluid consistency: Thin  aspirin 81 mg daily prior to admission, now on aspirin 325 mg daily  Patient counseled to be compliant with her antithrombotic medications  Ongoing aggressive stroke risk factor management  Therapy recommendations: pending  Disposition: Pending  Hypertension  Stable  Permissive hypertension (OK if < 220/120) but gradually normalize in 5-7 days  Long-term BP goal normotensive  Hyperlipidemia  Home meds:  Lipitor 10 mg daily resumed in hospital  LDL 80, goal < 70  Increase Lipitor to 20 mg daily  Continue statin at discharge  Other Stroke Risk Factors  Advanced age  The patient quit smoking 6 weeks ago.  ETOH use, advised to drink no more than 1 drink per day  Overweight, Body mass index is 27.21 kg/m., recommend weight loss, diet and exercise as appropriate    Other Active Problems  Lung nodules - concerning for a lymphoproliferative process or lymphatic spread of  malignancy. F/U Chest CT recommended in 3 mos.  Mild anemia - 11.4 / 33.6  Hypokalemia - 2.7 -> supplement and f/u labs ordered.  UDS - positive for [facility] day # 1  To contact Stroke Continuity provider, please refer to [email or web address] After hours, contact General Neurology

## 2017-02-15 NOTE — Evaluation (Signed)
Physical Therapy Evaluation Patient Details Name: Faith [name] MRN: [id number] DOB: Jul 04, 1951 Today's Date: 02/15/2017   History of Present Illness  Pt is a 66 y/o female with a PMH significant for PVD, HTN, schizophrenia, recurrent bronchitis. 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  Clinical Impression  Pt admitted with above diagnosis. Pt currently with functional limitations due to the deficits listed below (see PT Problem List). At the time of PT eval, pt was limited by dizziness, pain, and general weakness. PTA, pt reports she was able to manage around her home during the day without assistance, however had an aide that came to stay at night. Pt will benefit from skilled PT to increase their independence and safety with mobility to allow discharge to the venue listed below.       Follow Up Recommendations CIR;Supervision/Assistance - 24 hour    Equipment Recommendations  3in1 (PT)    Recommendations for Other Services Rehab consult     Precautions / Restrictions Precautions Precautions: Fall Restrictions Weight Bearing Restrictions: No      Mobility  Bed Mobility Overal bed mobility: Needs Assistance Bed Mobility: Supine to Sit     Supine to sit: Min guard     General bed mobility comments: HOB flat. Increased time required with min guard at shoulders to complete scoot to EOB. Pt reports significant increase in dizziness upon sitting up. Denies vertigo and states it is a lightheaded feeling.  Transfers Overall transfer level: Needs assistance Equipment used: Rolling walker (2 wheeled) Transfers: Sit to/from Stand Sit to Stand: Min assist         General transfer comment: Steadying assist to power-up to full standing position. Increased time to achieve full stand due to dizziness.    Ambulation/Gait Ambulation/Gait assistance: Min assist Ambulation Distance (Feet): 25 Feet Assistive device: Rolling walker (2 wheeled) Gait Pattern/deviations: Step-through pattern;Decreased stride length;Trunk flexed Gait velocity: Decreased Gait velocity interpretation: Below normal speed for age/gender General Gait Details: Pt was able to ambulate with RW out to hall and back to the bed. Pt reports that dizziness improves with ~3 minutes of pre-gait activity at EOB. Pt side stepped and marched in place prior to stepping away from the bedside. Occasional assist provided for balance and walker management.   Stairs            Wheelchair Mobility    Modified Rankin (Stroke Patients Only) Modified Rankin (Stroke Patients Only) Pre-Morbid Rankin Score: Moderate disability Modified Rankin: Moderately severe disability     Balance Overall balance assessment: Needs assistance Sitting-balance support: Feet supported;No upper extremity supported Sitting balance-Leahy Scale: Fair     Standing balance support: No upper extremity supported;During functional activity Standing balance-Leahy Scale: Poor Standing balance comment: Requires UE support                             Pertinent Vitals/Pain Pain Assessment: 0-10 Pain Score: 7  Pain Location: RLE Pain Intervention(s):  (RN present and said she would check for pain meds)    Home Living Family/patient expects to be discharged to:: Private residence Living Arrangements: Alone Available Help at Discharge: Family;Personal care attendant;Available PRN/intermittently (PCA at night only) Type of Home: Apartment Home Access: Stairs to enter Entrance Stairs-Rails: Right Entrance Stairs-Number of Steps: 10 (5 and 5) Home Layout: One level Home  Equipment: Cane - single point;Walker - 2 wheels      Prior Function Level of Independence: Needs assistance   Gait / Transfers Assistance Needed: Used the cane or walker  depending on how she was feeling that day  ADL's / Homemaking Assistance Needed: Assist for putting shoes/socks on and bathing         Hand Dominance   Dominant Hand: Right    Extremity/Trunk Assessment   Upper Extremity Assessment Upper Extremity Assessment: Defer to OT evaluation    Lower Extremity Assessment Lower Extremity Assessment: Generalized weakness;RLE deficits/detail RLE Deficits / Details: Decreased strength and general guarding consistent with fem-pop last month RLE: Unable to fully assess due to pain    Cervical / Trunk Assessment Cervical / Trunk Assessment: Other exceptions Cervical / Trunk Exceptions: Forward head/rounded shoulders posture  Communication   Communication: No difficulties  Cognition Arousal/Alertness: Awake/alert Behavior During Therapy: WFL for tasks assessed/performed Overall Cognitive Status: No family/caregiver present to determine baseline cognitive functioning                 General Comments: Family present during session however gave no indication of baseline and gave very minimal information when directly asked about PLOF.     General Comments      Exercises     Assessment/Plan    PT Assessment Patient needs continued PT services  PT Problem List Decreased strength;Decreased range of motion;Decreased activity tolerance;Decreased balance;Decreased mobility;Decreased knowledge of use of DME;Decreased safety awareness;Decreased knowledge of precautions;Pain       PT Treatment Interventions DME instruction;Gait training;Stair training;Functional mobility training;Therapeutic activities;Therapeutic exercise;Neuromuscular re-education;Patient/family education    PT Goals (Current goals can be found in the Care Plan section)  Acute Rehab PT Goals Patient Stated Goal: Return home when able PT Goal Formulation: With patient Time For Goal Achievement: 03/01/17 Potential to Achieve Goals: Good    Frequency Min 4X/week    Barriers to discharge Inaccessible home environment Pt with 10 STE her apartment    Co-evaluation               End of Session Equipment Utilized During Treatment: Gait belt Activity Tolerance: Patient limited by fatigue;Patient limited by pain Patient left: Other (comment) (Sitting EOB with OT) Nurse Communication: Mobility status PT Visit Diagnosis: Unsteadiness on feet (R26.81);Other symptoms and signs involving the nervous system (R29.898)         Time: 1355-1415 PT Time Calculation (min) (ACUTE ONLY): 20 min   Charges:   PT Evaluation $PT Eval Moderate Complexity: 1 Procedure     PT G Codes:         Thelma Comp 02/15/2017, 2:42 PM   Rolinda Roan, PT, DPT Acute Rehabilitation Services Pager: [phone]

## 2017-02-15 NOTE — Progress Notes (Signed)
Rehab Admissions Coordinator Note:  Patient was screened by Retta Diones for appropriateness for an Inpatient Acute Rehab Consult.  At this time, we are recommending Inpatient Rehab consult.  Retta Diones 02/15/2017, 5:57 PM  I can be reached at [phone].

## 2017-02-15 NOTE — Consult Note (Addendum)
[facility] Nurse wound consult note Reason for Consult:Chronic nonhealing wound to right posterior lower leg near Achilles' tendon.  Nonhealing surgical wound to right inguinal area.  Wound type:chronic nonhealing.  Pressure Injury POA: Yes Measurement:Right posterior leg:  1 cm x 0.3 cm x 0.2 cm pale pink nongranulating Right inguinal opening:  0.5 cm diameter pale pink nongranulating.   Wound GYI:[id number].  Drainage (amount, consistency, odor) minimal serosanguinous Periwound:intact Dressing procedure/placement/frequency:Cleanse wound to right posterior leg and right inguinal fold with NS and pat gently dry.  Apply small strip of Aquacel Ag ([facility]son # [id number])XH wound bed.  Cover with 4x4 and tape.  Change every other day,    Will not follow at this time.  Please re-consult if needed.  Domenic Moras RN BSN [place] Pager [phone]

## 2017-02-15 NOTE — Progress Notes (Signed)
R groin w/stage III wound, old dressing removed, new dressing of auquacel extra then gauze, paper tape. RLE stage III ulcer posterior just above ankle-old dressing removed, aquacel then gauze then paper tape applied. Pt tol well.

## 2017-02-16 ENCOUNTER — Inpatient Hospital Stay (HOSPITAL_COMMUNITY): Payer: Medicaid Other

## 2017-02-16 DIAGNOSIS — I6789 Other cerebrovascular disease: Secondary | ICD-10-CM

## 2017-02-16 LAB — BASIC METABOLIC PANEL
Anion gap: 9 (ref 5–15)
BUN: 7 mg/dL (ref 6–20)
CHLORIDE: 113 mmol/L — AB (ref 101–111)
CO2: 19 mmol/L — ABNORMAL LOW (ref 22–32)
CREATININE: 0.59 mg/dL (ref 0.44–1.00)
Calcium: 9.6 mg/dL (ref 8.9–10.3)
GFR calc Af Amer: >60 mL/min (ref >60)
Glucose, Bld: 119 mg/dL — ABNORMAL HIGH (ref 65–99)
Potassium: 2.8 mmol/L — ABNORMAL LOW (ref 3.5–5.1)
SODIUM: 141 mmol/L (ref 135–145)

## 2017-02-16 LAB — HEMOGLOBIN A1C
Hgb A1c MFr Bld: 4.8 % (ref 4.8–5.6)
MEAN PLASMA GLUCOSE: 91 mg/dL

## 2017-02-16 LAB — ECHOCARDIOGRAM COMPLETE
HEIGHTINCHES: 61 in
WEIGHTICAEL: 2304 [oz_av]

## 2017-02-16 MED ORDER — CLOPIDOGREL BISULFATE 75 MG PO TABS
75.0000 mg | ORAL_TABLET | Freq: Every day | ORAL | Status: DC
  Administered 2017-02-16 – 2017-02-18 (×3): 75 mg via ORAL
  Filled 2017-02-16 (×3): qty 1

## 2017-02-16 MED ORDER — HYDROCODONE-ACETAMINOPHEN 5-325 MG PO TABS
1.0000 | ORAL_TABLET | Freq: Once | ORAL | Status: AC
  Administered 2017-02-17: 1 via ORAL
  Filled 2017-02-16: qty 1

## 2017-02-16 MED ORDER — POTASSIUM CHLORIDE CRYS ER 20 MEQ PO TBCR
20.0000 meq | EXTENDED_RELEASE_TABLET | Freq: Two times a day (BID) | ORAL | Status: DC
  Administered 2017-02-16 – 2017-02-18 (×5): 20 meq via ORAL
  Filled 2017-02-16 (×5): qty 1

## 2017-02-16 MED ORDER — TRAMADOL HCL 50 MG PO TABS
50.0000 mg | ORAL_TABLET | Freq: Four times a day (QID) | ORAL | Status: DC | PRN
  Administered 2017-02-16 – 2017-02-17 (×4): 50 mg via ORAL
  Filled 2017-02-16 (×5): qty 1

## 2017-02-16 MED ORDER — SODIUM CHLORIDE 0.9 % IV SOLN
30.0000 meq | Freq: Once | INTRAVENOUS | Status: AC
  Administered 2017-02-16: 30 meq via INTRAVENOUS
  Filled 2017-02-16: qty 15

## 2017-02-16 NOTE — Progress Notes (Signed)
Physical Therapy Treatment Patient Details Name: Faith [name] MRN: [id number] DOB: May 03, 1951 Today's Date: 02/16/2017    History of Present Illness Pt is a 66 y/o female with a PMH significant for PVD, HTN, schizophrenia, recurrent bronchitis. 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    PT Comments    Patient reports now that she prefers to go home and that she will have assist from her boyfriend and her neighbor in addition to her aide.  Feel she could go home as long as has 24 hour assist.  However, needs to negotiate stairs to ensure able to access her apartment prior to d/c home.  Did issue HEP this session for heel cord stretch for L as pt demonstrates weak ankle DF and tight heel cords walking on her toe.  PT to follow.    Follow Up Recommendations  Home health PT;Supervision/Assistance - 24 hour     Equipment Recommendations  3in1 (PT)    Recommendations for Other Services       Precautions / Restrictions Precautions Precautions: Fall    Mobility  Bed Mobility   Bed Mobility: Supine to Sit       Sit to supine: Modified independent (Device/Increase time)      Transfers   Equipment used: Rolling walker (2 wheeled) Transfers: Sit to/from Stand Sit to Stand: Min guard         General transfer comment: pulls up on walker, assist for safety  Ambulation/Gait Ambulation/Gait assistance: Min guard Ambulation Distance (Feet): 30 Feet Assistive device: Rolling walker (2 wheeled) Gait Pattern/deviations: Step-to pattern;Decreased stride length;Antalgic;Decreased stance time - left;Decreased step length - right     General Gait Details: no c/o dizziness, toe walk on L LE and antalgic with reports does better with her shoe that is at home,  Discussed bringing shoe here   Stairs             Wheelchair Mobility    Modified Rankin (Stroke Patients Only) Modified Rankin (Stroke Patients Only) Pre-Morbid Rankin Score: Moderate disability Modified Rankin: Moderately severe disability     Balance Overall balance assessment: Needs assistance   Sitting balance-Leahy Scale: Good     Standing balance support: Bilateral upper extremity supported Standing balance-Leahy Scale: Poor Standing balance comment: UE support during session, did not assess without UE support                    Cognition Arousal/Alertness: Awake/alert Behavior During Therapy: WFL for tasks assessed/performed Overall Cognitive Status: Within Functional Limits for tasks assessed                      Exercises General Exercises - Lower Extremity Ankle Circles/Pumps: AAROM;Left;10 reps;Seated Other Exercises Other Exercises: passive stretch to L heel cord x 3 reps 20 sec hold Other Exercises: Patient educated and performed self stretch with belt, then with sheet for ankle heel cord stretch x 2 reps 20 sec hold    General Comments        Pertinent Vitals/Pain Pain Score: 6  Pain Location: RLE Pain Descriptors / Indicators: Sore Pain Intervention(s): Monitored during session;Limited activity within patient's tolerance    Home Living                      Prior Function  PT Goals (current goals can now be found in the care plan section) Progress towards PT goals: Progressing toward goals    Frequency    Min 4X/week      PT Plan Discharge plan needs to be updated    Co-evaluation             End of Session Equipment Utilized During Treatment: Gait belt Activity Tolerance: Patient limited by pain Patient left: in chair;with call bell/phone within reach;with chair alarm set   PT Visit Diagnosis: Unsteadiness on feet (R26.81);Other symptoms and signs involving the nervous system (R29.898)     Time: 8978-4784 PT Time Calculation (min)  (ACUTE ONLY): 24 min  Charges:  $Gait Training: 8-22 mins $Therapeutic Exercise: 8-22 mins                    G Codes:       Reginia Naas 03/14/2017, 11:18 AM  Magda Kiel, PT [phone] 03-14-2017

## 2017-02-16 NOTE — Care Management Note (Signed)
Case Management Note  Patient Details  Name: Blessed Martinique MRN: [id number] Date of Birth: [birth year]  Subjective/Objective:              Patient was admitted with CVA. Lives at home alone with a personal care assistant at night.  CM will follow for discharge needs pending therapy evals and physician orders.       Action/Plan:   Expected Discharge Date:                  Expected Discharge Plan:     In-House Referral:     Discharge planning Services     Post Acute Care Choice:    Choice offered to:     DME Arranged:    DME Agency:     HH Arranged:    HH Agency:     Status of Service:     If discussed at [employer] of Stay Meetings, dates discussed:    Additional Comments:  Rolm Baptise, RN 02/16/2017, 9:46 AM

## 2017-02-16 NOTE — Clinical Social Work Note (Signed)
CSW consulted for SNF placement, as backup to CIR. P/T recommending home home with home health; 24 hours supervision.Pt prefers home and reports she will have boyfriend and neighbor to provide 24 hour assist. CSW consulted RNCM, who will follow for d/c needs. CSW signing off as no further SW needs identified. Please reconsult if new needs arise.   Faith [name], [place], [place] Work [phone]

## 2017-02-16 NOTE — Progress Notes (Signed)
  Echocardiogram 2D Echocardiogram has been performed.  Jennette Dubin 02/16/2017, 3:37 PM

## 2017-02-16 NOTE — Progress Notes (Signed)
Thank you for consult on Faith [name]. Note that she is doing better today and is min guard assist. Per reports , she has P[employer] aide at nights and would prefer to go home with 24 hours assist by friends/family. Concur with recommendations for HHPT after discharge. Will defer CIR consult for now and please re-consult if patient changes her mind.

## 2017-02-16 NOTE — Progress Notes (Signed)
PROGRESS NOTE    Faith [name]  TDH:[id number] DOB: Mar 15, 1951 DOA: 02/14/2017 PCP: Philis Fendt, MD   Brief Narrative: 66 y.o. female  with past medical history significant for peripheral vascular disease, recurrent bronchitis, hypertension, schizophrenia, reflux presents emergency room with chief complaint of strokelike symptoms. Patient states that she's been in normal state of health for the last week. Acutely after. So, with the last 12 hours patient began to have symptoms of spinning, visual disturbance and left-sided facial pain. This alarmed the patient and she asked a friend activate EMS. Patient has no history of head trauma or stroke.  Assessment & Plan:   Principal Problem:   Stroke-like symptom - stroke work up underway - neurology on board  Active Problems:   HTN (hypertension) - allow permissive hypertension while undergoing work up    Depression - stable currently    Nonhealing nonsurgical wound - continue wound care consult    Low potassium syndrome - continue replacement, still low despite aggressive replacement - Will place on k dur 20 meq po bid. Administer 30 meq IV - reassess next am.   DVT prophylaxis: SCD's Code Status: Full Family Communication: d/c patient dirrectly Disposition Plan: pending normalization of potassium levels.   Consultants:   Neurology   Procedures: none   Antimicrobials: None   Subjective: Pt has no new complaints. No acute issues overnight.  Objective: Vitals:   02/16/17 0100 02/16/17 0529 02/16/17 0900 02/16/17 1232  BP: (!) 136/59 (!) 147/51 126/65 (!) 140/57  Pulse: (!) 55 61 68 (!) 57  Resp: '16 16 16 16  '$ Temp: 97.9 F (36.6 C) 98.1 F (36.7 C) 97.5 F (36.4 C) 97.6 F (36.4 C)  TempSrc: Oral Oral Oral Oral  SpO2: 99% 99% 100% 100%  Weight:      Height:       No intake or output data in the 24 hours ending 02/16/17 1325 Filed Weights   02/14/17 0448 02/15/17 0500 02/15/17 0900  Weight: 67.1 kg  (147 lb 14.9 oz) 64.9 kg (143 lb 1.6 oz) 65.3 kg (144 lb)    Examination:  General exam: Appears calm and comfortable, in nad. Respiratory system: Clear to auscultation. Respiratory effort normal. Cardiovascular system: S1 & S2 heard, RRR.  Gastrointestinal system: Abdomen is nondistended, soft and nontender. No organomegaly or masses felt. Normal bowel sounds heard. Central nervous system: Alert and oriented. No focal neurological deficits. Extremities: no cyanosis, pulses present Skin: No rashes, warm and dry Psychiatry: . Mood & affect appropriate.   Data Reviewed: I have personally reviewed following labs and imaging studies  CBC:  Recent Labs Lab 02/14/17 0420 02/14/17 0429 02/15/17 0609  WBC 12.1*  --  6.8  NEUTROABS 5.2  --   --   HGB 12.2 12.9 11.4*  HCT 36.5 38.0 33.6*  MCV 84.3  --  83.4  PLT 220  --  646   Basic Metabolic Panel:  Recent Labs Lab 02/14/17 0420 02/14/17 0425 02/14/17 0429 02/14/17 1849 02/15/17 0609 02/15/17 0610 02/16/17 0658  NA 142  --  146* 134* 139  --  141  K 2.2*  --  2.2* 2.5* 2.7*  --  2.8*  CL 117*  --  117* 110 117*  --  113*  CO2 15*  --   --  16* 16*  --  19*  GLUCOSE 167*  --  160* 120* 128*  --  119*  BUN 15  --  '17 11 8  '$ --  7  CREATININE  0.66  --  0.60 0.55 0.58  --  0.59  CALCIUM 9.4  --   --  9.0 9.3  --  9.6  MG  --  2.1  --   --   --  1.8  --   PHOS  --  2.9  --   --   --   --   --    GFR: Estimated Creatinine Clearance: 61.5 mL/min (by C-G formula based on SCr of 0.59 mg/dL). Liver Function Tests:  Recent Labs Lab 02/14/17 0420  AST 16  ALT 11*  ALKPHOS 116  BILITOT 0.4  PROT 7.0  ALBUMIN 3.6   No results for input(s): LIPASE, AMYLASE in the last 168 hours. No results for input(s): AMMONIA in the last 168 hours. Coagulation Profile:  Recent Labs Lab 02/14/17 0420  INR 1.08   Cardiac Enzymes: No results for input(s): CKTOTAL, CKMB, CKMBINDEX, TROPONINI in the last 168 hours. BNP (last 3  results) No results for input(s): PROBNP in the last 8760 hours. HbA1C:  Recent Labs  02/15/17 0610  HGBA1C 4.8   CBG:  Recent Labs Lab 02/14/17 0422  GLUCAP 167*   Lipid Profile:  Recent Labs  02/15/17 0610  CHOL 139  HDL 34*  LDLCALC 80  TRIG 124  CHOLHDL 4.1   Thyroid Function Tests: No results for input(s): TSH, T4TOTAL, FREET4, T3FREE, THYROIDAB in the last 72 hours. Anemia Panel: No results for input(s): VITAMINB12, FOLATE, FERRITIN, TIBC, IRON, RETICCTPCT in the last 72 hours. Sepsis Labs: No results for input(s): PROCALCITON, LATICACIDVEN in the last 168 hours.  No results found for this or any previous visit (from the past 240 hour(s)).    Radiology Studies: No results found.  Scheduled Meds: . aspirin EC  325 mg Oral Daily  . atorvastatin  20 mg Oral q1800  . nicotine polacrilex  4 mg Oral Q4H while awake  . ondansetron (ZOFRAN) IV  4 mg Intravenous Once  . potassium chloride (KCL MULTIRUN) 30 mEq in 265 mL IVPB  30 mEq Intravenous Once  . potassium chloride  20 mEq Oral BID  . sodium chloride flush  3 mL Intravenous Q12H  . tetracaine  2 drop Both Eyes Once   Continuous Infusions:   LOS: 2 days    Time spent: > 35 minutes  Velvet Bathe, MD [facility] Pager [phone]  If 7PM-7AM, please contact night-coverage [email or web address] Password [email or web address] 02/16/2017, 1:25 PM

## 2017-02-16 NOTE — Progress Notes (Signed)
STROKE TEAM PROGRESS NOTE   HISTORY OF PRESENT ILLNESS (per record) Faith [name] is a 66 y.o. female with a history of peripheral artery disease who underwent a femoral bypass last month. She was in her normal state of health on laying down around 11:30 PM, however on awakening shortly after 3 AM she was severely vertiginous. She had nausea and vomiting. She also has severe left eye pain and blurred vision.   SUBJECTIVE (INTERVAL HISTORY) Her   family members were not at the bedside but upon patient's request as spoke to her daughter over the phone and updated her on her condition.  Patient is just returned from vascular lab. She had peripheral vascular bypass surgery 2 weeks ago and states she had never had a carotid ultrasound checked preop OBJECTIVE Temp:  [97.5 F (36.4 C)-98.8 F (37.1 C)] 98.3 F (36.8 C) (03/19 1357) Pulse Rate:  [52-68] 66 (03/19 1357) Cardiac Rhythm: Normal sinus rhythm (03/19 0700) Resp:  [16-20] 16 (03/19 1357) BP: (123-147)/(47-65) 135/51 (03/19 1357) SpO2:  [97 %-100 %] 99 % (03/19 1357)  CBC:   Recent Labs Lab 02/14/17 0420 02/14/17 0429 02/15/17 0609  WBC 12.1*  --  6.8  NEUTROABS 5.2  --   --   HGB 12.2 12.9 11.4*  HCT 36.5 38.0 33.6*  MCV 84.3  --  83.4  PLT 220  --  160    Basic Metabolic Panel:  Recent Labs Lab 02/14/17 0425  02/15/17 0609 02/15/17 0610 02/16/17 0658  NA  --   < > 139  --  141  K  --   < > 2.7*  --  2.8*  CL  --   < > 117*  --  113*  CO2  --   < > 16*  --  19*  GLUCOSE  --   < > 128*  --  119*  BUN  --   < > 8  --  7  CREATININE  --   < > 0.58  --  0.59  CALCIUM  --   < > 9.3  --  9.6  MG 2.1  --   --  1.8  --   PHOS 2.9  --   --   --   --   < > = values in this interval not displayed.  Lipid Panel:     Component Value Date/Time   CHOL 139 02/15/2017 0610   TRIG 124 02/15/2017 0610   HDL 34 (L) 02/15/2017 0610   CHOLHDL 4.1 02/15/2017 0610   VLDL 25 02/15/2017 0610   LDLCALC 80 02/15/2017 0610    HgbA1c:  Lab Results  Component Value Date   HGBA1C 4.8 02/15/2017   Urine Drug Screen:     Component Value Date/Time   LABOPIA NONE DETECTED 02/26/2014 1408   COCAINSCRNUR NONE DETECTED 02/26/2014 1408   LABBENZ NONE DETECTED 02/26/2014 1408   AMPHETMU NONE DETECTED 02/26/2014 1408   THCU POSITIVE (A) 02/26/2014 1408   LABBARB NONE DETECTED 02/26/2014 1408      IMAGING  Ct Angio Head and Neck W Or Wo Contrast 02/14/2017 1. 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  2. No other intracranial arterial occlusion or high-grade stenosis.  3. Multiple enlarged upper mediastinal lymph nodes measuring up to 2 cm. These are concerning for a lymphoproliferative process or lymphatic spread of malignancy from an unidentified source.  4. 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   Ct Chest Wo Contrast 02/14/2017 Moderate mediastinal adenopathy as described with the largest lymph node in the right peritracheal region measuring 2.4 cm by short axis. There is also adenopathy over the right neck base with necrotic nodes over the right cervical chain on recent neck CT. Findings likely represent a neoplastic process as consider tissue sampling of 1 of the lower right cervical chain nodes. Few small pulmonary nodules as described some improved and some new. Most likely due to waxing and waning inflammatory process. In light of the above described adenopathy, metastatic disease is not excluded. Largest nodule is present over the right lower lobe inferior to the hilum measuring 6-7 mm. Recommend followup CT 3 months. Aortic atherosclerosis.  Atherosclerotic coronary artery disease. 1.5 cm liver cyst unchanged.   Mr Brain Wo Contrast 02/14/2017 Scattered punctate acute infarctions in the left MCA territory which could be micro  embolic infarctions or watershed infarctions. These are present in the left frontal and parietal cortical and subcortical brain, the left caudate and the left deep white matter. No large or confluent infarction. No antegrade flow in the distal left cervical ICA as previously demonstrated at CT angiography. Old small vessel infarctions affecting the thalami, basal ganglia and cerebral hemispheric white matter.   Ct Head Code Stroke W/o Cm 02/14/2017 1. No acute intracranial abnormality.  2. 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  3. ASPECTS is 10.    Physical Exam  Constitutional: Frail elderly African-American lady not in distress  . Afebrile. Head is nontraumatic. Neck is supple without bruit.    Cardiac exam no murmur or gallop. Lungs are clear to auscultation. Distal pulses are  not well felt.  Neuro: Mental Status: Patient is lethargic but follow commands No signs of aphasia or neglect; patient is dysarthric  Cranial Nerves: II: Visual Fields are full in the right eye. In the left eye she has hand waving vision. Pupils are equal, but there is an afferent pupillary defect on the left.  III,IV, VI: She has disconjugate gaze with the left eye down and out compared to the right. She has full extra ocular movements the right eye. She does have nystagmus  V: Facial sensation is symmetric to touch VII: Facial movement is symmetric.  VIII: hearing is intact to voice X: Uvula elevates symmetrically XI: Shoulder shrug is symmetric. XII: tongue is midline without atrophy or fasciculations.  Motor: Tone is normal. Bulk is normal. 5/5 strength was present in bilateral arms, she guards the right leg due to recent surgery but does appear to have good strength in it without drift. She is weaker in the legs as compared to arms.  Left leg easily achieves antigravity.  Right leg drifts to the bed Sensory: She does have some numbness of the right leg, which she states is baseline since her  surgery  Cerebellar: FNF intact bilaterally   ASSESSMENT/PLAN Faith [name] is a 66 y.o. female with history of schizophrenia, peripheral vascular disease, chronic bronchitis, hypertension, hyperlipidemia, and bipolar disorder presenting with severe left eye pain with blurred vision, vertigo, nausea and vomiting.  She did not receive IV t-PA due to unknown time of onset.  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   Resultant  Mild right sided weakness and dysarthria  MRI - Scattered punctate acute infarctions - left frontal and parietal cortical and subcortical brain, the left caudate and the  left deep white matter  MRA - not performed.  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   Carotid Doppler - CTA neck  2D Echo - pending  LDL - 80  HgbA1c - 4.8  VTE prophylaxis - SCDs Diet Heart Room service appropriate? Yes; Fluid consistency: Thin  aspirin 81 mg daily prior to admission, now on aspirin 325 mg daily  Patient counseled to be compliant with her antithrombotic medications  Ongoing aggressive stroke risk factor management Therapy recommendations: CLR Hypertension  Stable  Permissive hypertension (OK if < 220/120) but gradually normalize in 5-7 days  Long-term BP goal normotensive  Hyperlipidemia  Home meds:  Lipitor 10 mg daily resumed in hospital  LDL 80, goal < 70  Increase Lipitor to 20 mg daily  Continue statin at discharge  Other Stroke Risk Factors  Advanced age  The patient quit smoking 6 weeks ago.  ETOH use, advised to drink no more than 1 drink per day  Overweight, Body mass index is 27.21 kg/m., recommend weight loss, diet and exercise as appropriate    Other Active Problems  Lung nodules - concerning for a lymphoproliferative process or lymphatic spread of malignancy. F/U Chest CT recommended in 3  mos.  Mild anemia - 11.4 / 33.6  Hypokalemia - 2.7 -> supplement and f/u labs ordered.  UDS - positive for [facility] day # 2 I have personally examined this patient, reviewed notes, independently viewed imaging studies, participated in medical decision making and plan of care.ROS completed by me personally and pertinent positives fully documented  I have made any additions or clarifications directly to the above note. Patient has presented with vertigo, visual disturbance and left facial numbness but MRI scan shows what appear to be small left watershed infarcts secondary likely to left carotid occlusion. The patient's symptoms at presentation would not match or infarcts. She however has significant peripheral vascular disease and it is unclear whether the chronic carotid occlusion is acute or chronic. Recommend check carotid ultrasound and if there is trickle flow may need to consider carotid revascularization and vascular surgery consultation. Recommend aspirin and Plavix for 3 months followed by Plavix alone. I had a long discussion the patient's daughter over the phone and updated her about her condition and plan for evaluation and treatment and answered questions. Greater than 50% time during this 35 minute visit was spent on counseling and coordination of care about her strokes, carotid disease and answering questions  Antony Contras, MD Medical Director [facility] Pager: [phone] 02/16/2017 5:14 PM  To contact Stroke Continuity provider, please refer to [email or web address] After hours, contact General Neurology

## 2017-02-16 NOTE — Progress Notes (Signed)
Occupational Therapy Treatment Patient Details Name: Faith [name] MRN: [id number] DOB: Apr 12, 1951 Today's Date: 02/16/2017    History of present illness Pt is a 66 y/o female with a PMH significant for PVD, HTN, schizophrenia, recurrent bronchitis. 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   OT comments  Pt progressing toward OT goals. She demonstrates improved independence with toilet transfers this session requiring min guard assist and VC's for safe use of RW and was able to stand at sink for hand washing tasks with supervision this session. Decreased vision remains in L eye and educated pt on strategies to compensate for low vision and she verbalizes understanding. Pt able to provide increased information concerning PLOF and assistance available at home this session as compared to evaluation. She reports that she is able to have 24 hour assistance from boyfriend, neighbor and home health aide. Feel with 24 hour assistance she will be safe to return home with home health OT services for continued rehabilitation. Updated D/C plan accordingly.    Follow Up Recommendations  Home health OT;Supervision/Assistance - 24 hour    Equipment Recommendations  3 in 1 bedside commode    Recommendations for Other Services      Precautions / Restrictions Precautions Precautions: Fall Restrictions Weight Bearing Restrictions: No       Mobility Bed Mobility               General bed mobility comments: Sitting at EOB on arrival.  Transfers Overall transfer level: Needs assistance Equipment used: Rolling walker (2 wheeled) Transfers: Sit to/from Stand Sit to Stand: Min guard         General transfer comment: Continues to pull up on RW.    Balance Overall balance assessment: Needs assistance Sitting-balance support:  Feet supported;No upper extremity supported Sitting balance-Leahy Scale: Good     Standing balance support: Bilateral upper extremity supported;No upper extremity supported;During functional activity Standing balance-Leahy Scale: Fair Standing balance comment: Able to stand at sink for grooming tasks without UE support. Reliant on B UE support during functional mobility.                   ADL Overall ADL's : Needs assistance/impaired     Grooming: Supervision/safety;Standing               Lower Body Dressing: Min guard;Sit to/from stand   Toilet Transfer: Min guard;Ambulation;RW Toilet Transfer Details (indicate cue type and reason): Fatigues easily Toileting- Clothing Manipulation and Hygiene: Supervision/safety;Sitting/lateral lean       Functional mobility during ADLs: Min guard;Rolling walker General ADL Comments: Improved activity tolerance and no rest breaks required during 3 consecutive ADL tasks.      Vision                 Additional Comments: L eye blurred vision remains but improves in near gaze. Disconjugate gaze present. Pt does not have glasses with her at this time and is planning to return to opthamologist for new glasses in a few days. Able to identify items in L visual field easily during this session.   Perception     Praxis      Cognition   Behavior During Therapy: WFL for tasks assessed/performed Overall Cognitive Status: Within Functional Limits for tasks assessed Area of Impairment: Memory     Memory: Decreased short-term memory  General Comments: Continues to demonstrate some decreased short-term memory but able to problem solve well during this session.      Exercises     Shoulder Instructions       General Comments      Pertinent Vitals/ Pain       Pain Assessment: Faces Faces Pain Scale: Hurts a little bit Pain Location: RLE Pain Descriptors / Indicators: Sore Pain Intervention(s): Monitored during  session  Home Living                                          Prior Functioning/Environment              Frequency  Min 3X/week        Progress Toward Goals  OT Goals(current goals can now be found in the care plan section)  Progress towards OT goals: Progressing toward goals  Acute Rehab OT Goals Patient Stated Goal: Return home when able OT Goal Formulation: With patient Time For Goal Achievement: 03/01/17 Potential to Achieve Goals: Good ADL Goals Pt Will Perform Lower Body Bathing: with modified independence;sit to/from stand Pt Will Perform Lower Body Dressing: with modified independence;sit to/from stand Pt Will Transfer to Toilet: with modified independence;ambulating;bedside commode (BSC over toilet) Pt Will Perform Toileting - Clothing Manipulation and hygiene: with modified independence;sit to/from stand Pt/caregiver will Perform Home Exercise Program: Increased strength;Left upper extremity;With Supervision;With written HEP provided Additional ADL Goal #1: Pt will demonstrate anticipatory awareness during morning ADL routine in order to improve safety with daily self-care tasks.  Plan Discharge plan needs to be updated    Co-evaluation                 End of Session Equipment Utilized During Treatment: Gait belt;Rolling walker  OT Visit Diagnosis: Unsteadiness on feet (R26.81);Muscle weakness (generalized) (M62.81)   Activity Tolerance Patient tolerated treatment well   Patient Left with call bell/phone within reach;in chair;with chair alarm set   Nurse Communication Mobility status        Time: 0932-3557 OT Time Calculation (min): 18 min  Charges: OT General Charges $OT Visit: 1 Procedure OT Treatments $Self Care/Home Management : 8-22 mins  Norman Herrlich, MS OTR/L  Pager: Faith [name] 02/16/2017, 5:28 PM

## 2017-02-16 NOTE — Patient Instructions (Signed)
ANKLE: Dorsiflexion - Sitting    Sitting, place strap around foot. Pull foot toward body, keeping heel on floor. Keep foot straight. Hold _20__ seconds. _3__ reps per set, __2-3_ sets per day.  Copyright  VHI. All rights reserved.

## 2017-02-16 NOTE — Evaluation (Signed)
Speech Language Pathology Evaluation Patient Details Name: Faith [name] MRN: [id number] DOB: [birth year] Today's Date: 02/16/2017 Time: 4825-0037 SLP Time Calculation (min) (ACUTE ONLY): 18 min  Problem List:  Patient Active Problem List   Diagnosis Date Noted  . Stroke-like symptom 02/14/2017  . Low potassium syndrome 02/14/2017  . PAD (peripheral artery disease) ([place]) 01/15/2017  . Nonhealing nonsurgical wound 12/05/2016  . HTN (hypertension) 07/06/2014  . Depression 07/06/2014  . Back pain 07/06/2014  . Chronic cholecystitis 07/05/2014  . Right sided abdominal pain 05/11/2014  . Cholelithiasis 05/11/2014   Past Medical History:  Past Medical History:  Diagnosis Date  . Anxiety   . Arthritis    "thighs; legs; hips" (07/05/2014)  . Bipolar disorder ([place])   . Cholelithiasis 07/2014  . Chronic bronchitis ([place])    "get it q yr"  . Chronic lower back pain   . Depression    pt. use to go to depression clinic, states she still has depression & anxiety but can't get to appt. so she hasn't had any med. for it in a while   . Dyspnea   . GERD (gastroesophageal reflux disease)   . High cholesterol   . Hypertension   . Peripheral vascular disease ([place])   . Schizophrenia [facility])    Past Surgical History:  Past Surgical History:  Procedure Laterality Date  . ABDOMINAL AORTOGRAM N/A 01/07/2017   Procedure: Abdominal Aortogram;  Surgeon: Waynetta Sandy, MD;  Location: [place] [facility];  Service: Cardiovascular;  Laterality: N/A;  . ABDOMINAL HYSTERECTOMY    . ANKLE FRACTURE SURGERY Right   . ANKLE HARDWARE REMOVAL Right   . APPENDECTOMY  ~ 1963  . BREAST BIOPSY Bilateral   . BREAST CYST EXCISION Bilateral    "not cancer"  . CATARACT EXTRACTION W/ INTRAOCULAR LENS  IMPLANT, BILATERAL    . CHOLECYSTECTOMY N/A 07/05/2014   Procedure: LAPAROSCOPIC CHOLECYSTECTOMY WITH INTRAOPERATIVE CHOLANGIOGRAM;  Surgeon: Gayland Curry, MD;  Location: [place];  Service: General;  Laterality: N/A;   . EXCISIONAL HEMORRHOIDECTOMY    . FEMORAL-POPLITEAL BYPASS GRAFT Right 01/15/2017   Procedure: BYPASS GRAFT FEMORAL-POPLITEAL ARTERY RIGHT LEG;  Surgeon: Waynetta Sandy, MD;  Location: [place];  Service: Vascular;  Laterality: Right;  . LAPAROSCOPIC CHOLECYSTECTOMY  07/05/2014  . LOWER EXTREMITY ANGIOGRAPHY Bilateral 01/07/2017   Procedure: Lower Extremity Angiography;  Surgeon: Waynetta Sandy, MD;  Location: [place] [facility];  Service: Cardiovascular;  Laterality: Bilateral;  . MULTIPLE TOOTH EXTRACTIONS  05/2013   "pulled my upper teeth"  . PILONIDAL CYST EXCISION     HPI:  Pt is a 66 y/o female with a PMH significant for PVD, HTN, schizophrenia, recurrent bronchitis. 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   Assessment / Plan / Recommendation Clinical Impression  Pt presents with normal speech, fluent expression, good pragmatics and intact language.  Comprehension marked by mild difficulty with comprehension of complex paragraphs, + ability to follow commands and adequate yes/no reliability.  No SLP f/u is recommended at this time.  Pt going home with 24 hour supervision by her boyfriend.     SLP Assessment  SLP Recommendation/Assessment: Patient does not need any further Speech Lanaguage Pathology Services    Follow Up Recommendations  None    Frequency and Duration           SLP Evaluation Cognition  Overall Cognitive Status: Within Functional Limits for tasks assessed Arousal/Alertness: Awake/alert Orientation Level: Oriented X4 Attention: Selective Selective Attention: Appears intact Safety/Judgment: Appears intact       Comprehension  Auditory Comprehension Overall Auditory Comprehension: Impaired Yes/No Questions: Impaired Complex Questions: 75-100% accurate Commands: Within  Functional Limits Conversation: Simple Visual Recognition/Discrimination Discrimination: Within Function Limits    Expression Expression Primary Mode of Expression: Verbal Verbal Expression Overall Verbal Expression: Appears within functional limits for tasks assessed Initiation: No impairment Level of Generative/Spontaneous Verbalization: Conversation Repetition: No impairment Naming: No impairment Pragmatics: No impairment Written Expression Dominant Hand: Right   Oral / Motor  Oral Motor/Sensory Function Overall Oral Motor/Sensory Function: Within functional limits Motor Speech Overall Motor Speech: Appears within functional limits for tasks assessed   GO                    Juan Quam Laurice 02/16/2017, 11:52 AM

## 2017-02-17 ENCOUNTER — Inpatient Hospital Stay (HOSPITAL_COMMUNITY): Payer: Medicaid Other

## 2017-02-17 ENCOUNTER — Encounter (HOSPITAL_COMMUNITY): Payer: Medicaid Other

## 2017-02-17 DIAGNOSIS — R2990 Unspecified symptoms and signs involving the nervous system: Secondary | ICD-10-CM

## 2017-02-17 LAB — VAS US CAROTID
LCCADDIAS: 7 cm/s
LCCADSYS: 63 cm/s
LEFT ECA DIAS: -8 cm/s
LEFT VERTEBRAL DIAS: 8 cm/s
LICADDIAS: 0 cm/s
LICADSYS: -20 cm/s
LICAPSYS: -67 cm/s
Left CCA prox dias: 7 cm/s
Left CCA prox sys: 77 cm/s
RCCADSYS: -124 cm/s
RCCAPSYS: 110 cm/s
RIGHT ECA DIAS: 11 cm/s
RIGHT VERTEBRAL DIAS: 5 cm/s
Right CCA prox dias: 14 cm/s

## 2017-02-17 LAB — BASIC METABOLIC PANEL
Anion gap: 9 (ref 5–15)
BUN: 5 mg/dL — AB (ref 6–20)
CALCIUM: 9.2 mg/dL (ref 8.9–10.3)
CO2: 18 mmol/L — ABNORMAL LOW (ref 22–32)
CREATININE: 0.5 mg/dL (ref 0.44–1.00)
Chloride: 111 mmol/L (ref 101–111)
GFR calc Af Amer: >60 mL/min (ref >60)
Glucose, Bld: 100 mg/dL — ABNORMAL HIGH (ref 65–99)
Potassium: 3.3 mmol/L — ABNORMAL LOW (ref 3.5–5.1)
SODIUM: 138 mmol/L (ref 135–145)

## 2017-02-17 LAB — CBC
HCT: 34 % — ABNORMAL LOW (ref 36.0–46.0)
Hemoglobin: 11.4 g/dL — ABNORMAL LOW (ref 12.0–15.0)
MCH: 28 pg (ref 26.0–34.0)
MCHC: 33.5 g/dL (ref 30.0–36.0)
MCV: 83.5 fL (ref 78.0–100.0)
PLATELETS: 192 10*3/uL (ref 150–400)
RBC: 4.07 MIL/uL (ref 3.87–5.11)
RDW: 15.7 % — AB (ref 11.5–15.5)
WBC: 8.7 10*3/uL (ref 4.0–10.5)

## 2017-02-17 LAB — MAGNESIUM: Magnesium: 1.8 mg/dL (ref 1.7–2.4)

## 2017-02-17 NOTE — Progress Notes (Signed)
Physical Therapy Treatment Patient Details Name: Faith [name] MRN: [id number] DOB: [birth year] Today's Date: 02/17/2017    History of Present Illness Pt is a 66 y/o female with a PMH significant for PVD, HTN, schizophrenia, recurrent bronchitis. 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    PT Comments    Patient able to negotiate stairs this session appropriate for home entry.  Continues to report she will have help between her aide, significant other and neighbor.  Will need follow up HHPT at d/c.     Follow Up Recommendations  Home health PT;Supervision/Assistance - 24 hour     Equipment Recommendations  3in1 (PT)    Recommendations for Other Services       Precautions / Restrictions Precautions Precautions: Fall Required Braces or Orthoses: Other Brace/Splint Other Brace/Splint: post op shoe on R from home    Mobility  Bed Mobility   Bed Mobility: Supine to Sit     Supine to sit: Modified independent (Device/Increase time)        Transfers Overall transfer level: Needs assistance Equipment used: Rolling walker (2 wheeled)   Sit to Stand: Supervision         General transfer comment: assist to don shoe on R, pt donned shoe on L; S for safety  Ambulation/Gait Ambulation/Gait assistance: Supervision Ambulation Distance (Feet): 160 Feet Assistive device: Rolling walker (2 wheeled) Gait Pattern/deviations: Step-to pattern;Step-through pattern;Decreased stride length;Antalgic     General Gait Details: reports better with shoe, but still with antalgia on R   Stairs Stairs: Yes   Stair Management: One rail Right;Step to pattern;Sideways Number of Stairs: 10 General stair comments: educated pt how to have caregiver assist with bringing walker up the stairs and then guarding from down side on  steps  Wheelchair Mobility    Modified Rankin (Stroke Patients Only) Modified Rankin (Stroke Patients Only) Pre-Morbid Rankin Score: Moderate disability Modified Rankin: Moderately severe disability     Balance Overall balance assessment: Needs assistance   Sitting balance-Leahy Scale: Good       Standing balance-Leahy Scale: Fair                      Cognition Arousal/Alertness: Awake/alert Behavior During Therapy: WFL for tasks assessed/performed Overall Cognitive Status: Within Functional Limits for tasks assessed                      Exercises      General Comments General comments (skin integrity, edema, etc.): First attempt to see pt today, reports she had episode overnight with pain R side of face and wondered if had another stroke, RN and MD checked pt prior to tx and felt she had no new changes.       Pertinent Vitals/Pain Faces Pain Scale: Hurts little more Pain Location: R LE  Pain Descriptors / Indicators: Sore Pain Intervention(s): Monitored during session    Home Living                      Prior Function            PT Goals (current goals can now be found in the care plan section) Progress towards PT goals: Progressing toward goals    Frequency    Min 4X/week      PT Plan Current plan remains appropriate  Co-evaluation             End of Session Equipment Utilized During Treatment: Gait belt Activity Tolerance: Patient tolerated treatment well Patient left: with call bell/phone within reach;with chair alarm set;in chair   PT Visit Diagnosis: Unsteadiness on feet (R26.81);Other symptoms and signs involving the nervous system (R29.898)     Time: 0762-2633 PT Time Calculation (min) (ACUTE ONLY): 24 min  Charges:  $Gait Training: 23-37 mins                    G Codes:       Faith [name] 2017-03-13, 12:22 PM  Faith [name], [place] 03-13-17

## 2017-02-17 NOTE — Progress Notes (Signed)
PROGRESS NOTE    Faith [name]  VZD:[id number] DOB: 05-14-1951 DOA: 02/14/2017 PCP: Philis Fendt, MD   Brief Narrative: 66 y.o. female  with past medical history significant for peripheral vascular disease, recurrent bronchitis, hypertension, schizophrenia, reflux presents emergency room with chief complaint of strokelike symptoms. Patient states that she's been in normal state of health for the last week. Acutely after. So, with the last 12 hours patient began to have symptoms of spinning, visual disturbance and left-sided facial pain. This alarmed the patient and she asked a friend activate EMS. Patient has no history of head trauma or stroke.  Patient had carotid ultrasound which showed distal occlusion.  Hospital stay complicated by hypokalemia despite persistent replacement  Assessment & Plan:   Principal Problem:   Stroke-like symptom - stroke work up underway - neurology on board. Discussed carotid findings with neurologist. Patient may be discharged with vascular surgeon follow-up as outpatient.  Active Problems:   HTN (hypertension) - allow permissive hypertension while undergoing work up    Depression - stable currently    Nonhealing nonsurgical wound - continue wound care consult    Low potassium syndrome - Improved but not within normal levels. We'll continue K Dur 20 mEq twice a day and reassess BMP next a.m. If potassium level still low patient may require higher home dose potassium replacement. Magnesium levels 1.8 on last check   DVT prophylaxis: SCD's Code Status: Full Family Communication: d/c patient directly Disposition Plan: pending normalization of potassium levels.   Consultants:   Neurology   Procedures: none   Antimicrobials: None   Subjective: Pt has no new complaints. No acute issues overnight.  Objective: Vitals:   02/17/17 0512 02/17/17 0954 02/17/17 1300 02/17/17 1809  BP: (!) 134/52 137/60 128/64 (!) 155/72  Pulse: (!) 53 61   (!) 55  Resp: '18 18  20  '$ Temp: 98.2 F (36.8 C) 97.8 F (36.6 C) 98.1 F (36.7 C) 98.6 F (37 C)  TempSrc: Oral Axillary  Oral  SpO2: 99% 98%  98%  Weight:      Height:        Intake/Output Summary (Last 24 hours) at 02/17/17 1826 Last data filed at 02/17/17 1504  Gross per 24 hour  Intake              240 ml  Output              300 ml  Net              -60 ml   Filed Weights   02/15/17 0500 02/15/17 0900 02/17/17 0500  Weight: 64.9 kg (143 lb 1.6 oz) 65.3 kg (144 lb) 66.1 kg (145 lb 12.8 oz)    Examination:  General exam: Appears calm and comfortable, in nad. Respiratory system: Clear to auscultation. Respiratory effort normal. Cardiovascular system: S1 & S2 heard, RRR.  Gastrointestinal system: Abdomen is nondistended, soft and nontender. No organomegaly or masses felt. Normal bowel sounds heard. Central nervous system: Alert and oriented. No focal neurological deficits. Extremities: no cyanosis, pulses present Skin: No rashes, warm and dry Psychiatry: . Mood & affect appropriate.   Data Reviewed: I have personally reviewed following labs and imaging studies  CBC:  Recent Labs Lab 02/14/17 0420 02/14/17 0429 02/15/17 0609 02/17/17 0258  WBC 12.1*  --  6.8 8.7  NEUTROABS 5.2  --   --   --   HGB 12.2 12.9 11.4* 11.4*  HCT 36.5 38.0 33.6* 34.0*  MCV 84.3  --  83.4 83.5  PLT 220  --  206 889   Basic Metabolic Panel:  Recent Labs Lab 02/14/17 0420 02/14/17 0425 02/14/17 0429 02/14/17 1849 02/15/17 0609 02/15/17 0610 02/16/17 0658 02/17/17 0258  NA 142  --  146* 134* 139  --  141 138  K 2.2*  --  2.2* 2.5* 2.7*  --  2.8* 3.3*  CL 117*  --  117* 110 117*  --  113* 111  CO2 15*  --   --  16* 16*  --  19* 18*  GLUCOSE 167*  --  160* 120* 128*  --  119* 100*  BUN 15  --  '17 11 8  '$ --  7 5*  CREATININE 0.66  --  0.60 0.55 0.58  --  0.59 0.50  CALCIUM 9.4  --   --  9.0 9.3  --  9.6 9.2  MG  --  2.1  --   --   --  1.8  --  1.8  PHOS  --  2.9  --   --    --   --   --   --    GFR: Estimated Creatinine Clearance: 61.8 mL/min (by C-G formula based on SCr of 0.5 mg/dL). Liver Function Tests:  Recent Labs Lab 02/14/17 0420  AST 16  ALT 11*  ALKPHOS 116  BILITOT 0.4  PROT 7.0  ALBUMIN 3.6   No results for input(s): LIPASE, AMYLASE in the last 168 hours. No results for input(s): AMMONIA in the last 168 hours. Coagulation Profile:  Recent Labs Lab 02/14/17 0420  INR 1.08   Cardiac Enzymes: No results for input(s): CKTOTAL, CKMB, CKMBINDEX, TROPONINI in the last 168 hours. BNP (last 3 results) No results for input(s): PROBNP in the last 8760 hours. HbA1C:  Recent Labs  02/15/17 0610  HGBA1C 4.8   CBG:  Recent Labs Lab 02/14/17 0422  GLUCAP 167*   Lipid Profile:  Recent Labs  02/15/17 0610  CHOL 139  HDL 34*  LDLCALC 80  TRIG 124  CHOLHDL 4.1   Thyroid Function Tests: No results for input(s): TSH, T4TOTAL, FREET4, T3FREE, THYROIDAB in the last 72 hours. Anemia Panel: No results for input(s): VITAMINB12, FOLATE, FERRITIN, TIBC, IRON, RETICCTPCT in the last 72 hours. Sepsis Labs: No results for input(s): PROCALCITON, LATICACIDVEN in the last 168 hours.  No results found for this or any previous visit (from the past 240 hour(s)).    Radiology Studies: No results found.  Scheduled Meds: . aspirin EC  325 mg Oral Daily  . atorvastatin  20 mg Oral q1800  . clopidogrel  75 mg Oral Daily  . nicotine polacrilex  4 mg Oral Q4H while awake  . ondansetron (ZOFRAN) IV  4 mg Intravenous Once  . potassium chloride  20 mEq Oral BID  . sodium chloride flush  3 mL Intravenous Q12H  . tetracaine  2 drop Both Eyes Once   Continuous Infusions:   LOS: 3 days    Time spent: > 35 minutes  Velvet Bathe, MD [facility] Pager [phone]  If 7PM-7AM, please contact night-coverage [email or web address] Password [facility] 02/17/2017, 6:26 PM

## 2017-02-17 NOTE — Progress Notes (Signed)
STROKE TEAM PROGRESS NOTE   HISTORY OF PRESENT ILLNESS (per record) Faith [name] is a 66 y.o. female with a history of peripheral artery disease who underwent a femoral bypass last month. She was in her normal state of health on laying down around 11:30 PM, however on awakening shortly after 3 AM she was severely vertiginous. She had nausea and vomiting. She also has severe left eye pain and blurred vision.   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 OBJECTIVE Temp:  [97.5 F (36.4 C)-98.6 F (37 C)] 98.6 F (37 C) (03/20 1809) Pulse Rate:  [52-61] 55 (03/20 1809) Cardiac Rhythm: Sinus bradycardia (03/20 1900) Resp:  [18-20] 20 (03/20 1809) BP: (128-167)/(52-72) 155/72 (03/20 1809) SpO2:  [98 %-100 %] 98 % (03/20 1809) Weight:  [145 lb 12.8 oz (66.1 kg)] 145 lb 12.8 oz (66.1 kg) (03/20 0500)  CBC:   Recent Labs Lab 02/14/17 0420  02/15/17 0609 02/17/17 0258  WBC 12.1*  --  6.8 8.7  NEUTROABS 5.2  --   --   --   HGB 12.2  < > 11.4* 11.4*  HCT 36.5  < > 33.6* 34.0*  MCV 84.3  --  83.4 83.5  PLT 220  --  206 192  < > = values in this interval not displayed.  Basic Metabolic Panel:  Recent Labs Lab 02/14/17 0425  02/15/17 0610 02/16/17 0658 02/17/17 0258  NA  --   < >  --  141 138  K  --   < >  --  2.8* 3.3*  CL  --   < >  --  113* 111  CO2  --   < >  --  19* 18*  GLUCOSE  --   < >  --  119* 100*  BUN  --   < >  --  7 5*  CREATININE  --   < >  --  0.59 0.50  CALCIUM  --   < >  --  9.6 9.2  MG 2.1  --  1.8  --  1.8  PHOS 2.9  --   --   --   --   < > = values in this interval not displayed.  Lipid Panel:     Component Value Date/Time   CHOL 139 02/15/2017 0610   TRIG 124 02/15/2017 0610   HDL 34 (L) 02/15/2017 0610   CHOLHDL 4.1 02/15/2017 0610   VLDL 25 02/15/2017 0610   LDLCALC 80 02/15/2017 0610   HgbA1c:  Lab Results  Component Value Date    HGBA1C 4.8 02/15/2017   Urine Drug Screen:     Component Value Date/Time   LABOPIA NONE DETECTED 02/26/2014 1408   COCAINSCRNUR NONE DETECTED 02/26/2014 1408   LABBENZ NONE DETECTED 02/26/2014 1408   AMPHETMU NONE DETECTED 02/26/2014 1408   THCU POSITIVE (A) 02/26/2014 1408   LABBARB NONE DETECTED 02/26/2014 1408      IMAGING  Ct Angio Head and Neck W Or Wo Contrast 02/14/2017 1. 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  2. No other intracranial arterial occlusion or high-grade stenosis.  3. Multiple enlarged upper mediastinal lymph nodes measuring up to 2 cm. These are concerning for a lymphoproliferative process or lymphatic spread of malignancy from an unidentified source.  4. 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   Ct Chest Wo Contrast 02/14/2017 Moderate mediastinal adenopathy as described with the largest lymph node in the right peritracheal region measuring 2.4 cm by short axis. There is also adenopathy over the right neck base with necrotic nodes over the right cervical chain on recent neck CT. Findings likely represent a neoplastic process as consider tissue sampling of 1 of the lower right cervical chain nodes. Few small pulmonary nodules as described some improved and some new. Most likely due to waxing and waning inflammatory process. In light of the above described adenopathy, metastatic disease is not excluded. Largest nodule is present over the right lower lobe inferior to the hilum measuring 6-7 mm. Recommend followup CT 3 months. Aortic atherosclerosis.  Atherosclerotic coronary artery disease. 1.5 cm liver cyst unchanged.   Mr Brain Wo Contrast 02/14/2017 Scattered punctate acute infarctions in the left MCA territory which could be micro embolic infarctions or watershed infarctions. These  are present in the left frontal and parietal cortical and subcortical brain, the left caudate and the left deep white matter. No large or confluent infarction. No antegrade flow in the distal left cervical ICA as previously demonstrated at CT angiography. Old small vessel infarctions affecting the thalami, basal ganglia and cerebral hemispheric white matter.   Ct Head Code Stroke W/o Cm 02/14/2017 1. No acute intracranial abnormality.  2. 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  3. ASPECTS is 10.    Physical Exam  Constitutional: Frail elderly African-American lady not in distress  . Afebrile. Head is nontraumatic. Neck is supple without bruit.    Cardiac exam no murmur or gallop. Lungs are clear to auscultation. Distal pulses are  not well felt.  Neuro: Mental Status: Patient is awake and alert No signs of aphasia or neglect; patient is dysarthric  Cranial Nerves: II: Visual Fields are full in the right eye. In the left eye she has hand waving vision. Pupils are equal, but there is an afferent pupillary defect on the left.  III,IV, VI: She has disconjugate gaze with the left eye down and out compared to the right. She has full extra ocular movements the right eye. She does have nystagmus  V: Facial sensation is symmetric to touch VII: Facial movement is symmetric.  VIII: hearing is intact to voice X: Uvula elevates symmetrically XI: Shoulder shrug is symmetric. XII: tongue is midline without atrophy or fasciculations.  Motor: Tone is normal. Bulk is normal. 5/5 strength was present in bilateral arms, she guards the right leg due to recent surgery but does appear to have good strength in it without drift. She is weaker in the legs as compared to arms.  Left leg easily achieves antigravity.  Right leg drifts to the bed Sensory: She does have some numbness of the right leg, which she states is baseline since her surgery  Cerebellar: FNF intact bilaterally   ASSESSMENT/PLAN Ms. Faith  [name] is a 66 y.o. female with history of schizophrenia, peripheral vascular disease, chronic bronchitis, hypertension, hyperlipidemia, and bipolar disorder presenting with severe left eye pain with blurred vision, vertigo, nausea and vomiting.  She did not receive IV t-PA due to unknown time of onset.  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   Resultant  Mild right sided weakness and dysarthria  MRI - Scattered punctate acute infarctions - left frontal and parietal cortical and subcortical brain, the left caudate and the left deep  white matter  MRA - not performed.  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   Carotid Doppler - CTA neck 2D Echo - Left ventricle: The cavity size was normal. There was mild   concentric hypertrophy. Systolic function was normal. The   estimated ejection fraction was in the range of 55% to 60%. Wall   motion was normal; there were no regional wall motion   abnormalities. Left ventricular diastolic function parameters    were normal.  LDL - 80  HgbA1c - 4.8  VTE prophylaxis - SCDs Diet Heart Room service appropriate? Yes; Fluid consistency: Thin  aspirin 81 mg daily prior to admission, now on aspirin 325 mg daily  Patient counseled to be compliant with her antithrombotic medications  Ongoing aggressive stroke risk factor management Therapy recommendations: CLR Hypertension  Stable  Permissive hypertension (OK if < 220/120) but gradually normalize in 5-7 days  Long-term BP goal normotensive  Hyperlipidemia  Home meds:  Lipitor 10 mg daily resumed in hospital  LDL 80, goal < 70  Increase Lipitor to 20 mg daily  Continue statin at discharge  Other Stroke Risk Factors  Advanced age  The patient quit smoking 6 weeks ago.  ETOH use, advised to drink no more than 1 drink per day  Overweight, Body mass index  is 27.55 kg/m., recommend weight loss, diet and exercise as appropriate    Other Active Problems  Lung nodules - concerning for a lymphoproliferative process or lymphatic spread of malignancy. F/U Chest CT recommended in 3 mos.  Mild anemia - 11.4 / 33.6  Hypokalemia - 2.7 -> supplement and f/u labs ordered.  UDS - positive for [facility] day # 3 I have personally examined this patient, reviewed notes, independently viewed imaging studies, participated in medical decision making and plan of care.ROS completed by me personally and pertinent positives fully documented  I have made any additions or clarifications directly to the above note. Patient has presented with vertigo, visual disturbance and left facial numbness but MRI scan shows what appear to be small left watershed infarcts secondary likely to left carotid occlusion. The patient's symptoms at presentation would not match or infarcts. She however has significant peripheral vascular disease and it is unclear whether the chronic carotid occlusion is acute or chronic.   Recommend aspirin and Plavix for 3 months followed by Plavix alone.Recommend vascular surgery f/u as outpatient. I had a long discussion the patient's daughter over the phone and updated her about her condition and plan for evaluation and treatment and answered questions. Greater than 50% time during this 25 minute visit was spent on counseling and coordination of care about her strokes, carotid disease and answering questions Stroke team will sign off. Call for questions. F/U as outpatient in stroke clinic in [place], MD Medical Director [place] Pager: [phone] 02/17/2017 8:42 PM  To contact Stroke Continuity provider, please refer to [email or web address] After hours, contact General Neurology

## 2017-02-17 NOTE — Progress Notes (Signed)
*  PRELIMINARY RESULTS* Vascular Ultrasound Carotid Duplex (Doppler) has been completed.  Findings suggest 1-39% right internal carotid artery stenosis. The left internal carotid artery appears to be patent at its origin, however significantly atypical waveforms suggest distal occlusion. Vertebral arteries are patent with antegrade flow.  Incidental finding: the right lateral neck exhibits multiple heterogenous vascularized areas, largest measuring 2.1cm; suggestive of possible prominent lymph nodes versus unknown etiology.  02/17/2017 1:41 PM Maudry Mayhew, BS, RVT, RDCS, RDMS

## 2017-02-18 DIAGNOSIS — E876 Hypokalemia: Secondary | ICD-10-CM

## 2017-02-18 LAB — BASIC METABOLIC PANEL
Anion gap: 7 (ref 5–15)
BUN: 6 mg/dL (ref 6–20)
CALCIUM: 9.8 mg/dL (ref 8.9–10.3)
CO2: 21 mmol/L — ABNORMAL LOW (ref 22–32)
CREATININE: 0.56 mg/dL (ref 0.44–1.00)
Chloride: 113 mmol/L — ABNORMAL HIGH (ref 101–111)
GFR calc Af Amer: >60 mL/min (ref >60)
GFR calc non Af Amer: >60 mL/min (ref >60)
GLUCOSE: 94 mg/dL (ref 65–99)
Potassium: 3.5 mmol/L (ref 3.5–5.1)
SODIUM: 141 mmol/L (ref 135–145)

## 2017-02-18 MED ORDER — ASPIRIN EC 325 MG PO TBEC: 325.0000 mg | DELAYED_RELEASE_TABLET | Freq: Every day | ORAL | 0 refills | Status: DC

## 2017-02-18 MED ORDER — DIPHENHYDRAMINE HCL 12.5 MG/5ML PO ELIX: 25.0000 mg | ORAL_SOLUTION | Freq: Once | ORAL | Status: AC

## 2017-02-18 MED ORDER — ATORVASTATIN CALCIUM 10 MG PO TABS: 20.0000 mg | ORAL_TABLET | Freq: Every day | ORAL | 0 refills | Status: DC

## 2017-02-18 MED ORDER — CLOPIDOGREL BISULFATE 75 MG PO TABS: 75.0000 mg | ORAL_TABLET | Freq: Every day | ORAL | 0 refills | Status: AC

## 2017-02-18 MED ORDER — DIPHENHYDRAMINE HCL 25 MG PO CAPS
25.0000 mg | ORAL_CAPSULE | Freq: Every evening | ORAL | Status: DC | PRN
  Administered 2017-02-18 (×2): 25 mg via ORAL
  Filled 2017-02-18 (×2): qty 1

## 2017-02-18 MED ORDER — POTASSIUM CHLORIDE CRYS ER 20 MEQ PO TBCR: 40.0000 meq | EXTENDED_RELEASE_TABLET | Freq: Every day | ORAL | 0 refills | Status: DC

## 2017-02-18 MED ORDER — POTASSIUM CHLORIDE CRYS ER 20 MEQ PO TBCR
40.0000 meq | EXTENDED_RELEASE_TABLET | Freq: Once | ORAL | Status: AC
  Administered 2017-02-18: 40 meq via ORAL
  Filled 2017-02-18: qty 2

## 2017-02-18 NOTE — Care Management Note (Signed)
Case Management Note  Patient Details  Name: Faith [name] MRN: [id number] Date of Birth: 08/12/1951  Subjective/Objective:                    Action/Plan: Patient discharging home with [facility] services. CM met with the patient and her daughter and provided her a list of [facility] agencies. They selected [place]. Santiago Glad with [facility] notified and accepted the referral.  Pt with orders for 3 in 1. Pt states she has this equipment at home. Daughter to provide transportation.   Expected Discharge Date:  02/18/17               Expected Discharge Plan:  [place]  In-House Referral:     Discharge planning Services  CM Consult  Post Acute Care Choice:  Home Health Choice offered to:  Patient  DME Arranged:   (pt refused 3 in 1) DME Agency:     HH Arranged:  PT, OT HH Agency:  [place]  Status of Service:  Completed, signed off  If discussed at [place] of Stay Meetings, dates discussed:    Additional Comments:  Pollie Friar, RN 02/18/2017, 3:19 PM

## 2017-02-18 NOTE — Progress Notes (Signed)
Occupational Therapy Treatment/Discharge Patient Details Name: Faith [name] MRN: [id number] DOB: 09-02-1951 Today's Date: 02/18/2017    History of present illness Pt is a 66 y/o female with a PMH significant for PVD, HTN, schizophrenia, recurrent bronchitis. 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   OT comments  Pt progressing well toward OT goals. She was able to complete all ADL at supervision level this session. Discussed shower transfer techniques and pt and daughter verbalize understanding. Additionally educated pt and daughter concerning fall prevention and home set-up to improve safety post-acute D/C. D/C plan remains appropriate. Pt with plan to D/C home this afternoon with home health services. All further OT needs can be met via home health OT. Acute OT will sign off.    Follow Up Recommendations  Home health OT;Supervision/Assistance - 24 hour    Equipment Recommendations  3 in 1 bedside commode    Recommendations for Other Services      Precautions / Restrictions Precautions Precautions: Fall Required Braces or Orthoses: Other Brace/Splint Other Brace/Splint: post op shoe on R from home Restrictions Weight Bearing Restrictions: No       Mobility Bed Mobility               General bed mobility comments: Sitting at EOB on arrival.  Transfers Overall transfer level: Needs assistance Equipment used: Rolling walker (2 wheeled) Transfers: Sit to/from Stand Sit to Stand: Supervision              Balance Overall balance assessment: Needs assistance Sitting-balance support: Feet supported;No upper extremity supported Sitting balance-Leahy Scale: Good     Standing balance support: Bilateral upper extremity supported;No upper extremity supported;During functional activity Standing  balance-Leahy Scale: Fair Standing balance comment: Able to stand at sink for grooming tasks without UE support. Reliant on B UE support during functional mobility.                   ADL Overall ADL's : Needs assistance/impaired     Grooming: Supervision/safety;Standing                   Toilet Transfer: Supervision/safety;RW;Ambulation;BSC (BSC over toilet) Toilet Transfer Details (indicate cue type and reason): Fatigues easily Toileting- Water quality scientist and Hygiene: Supervision/safety;Sit to/from stand       Functional mobility during ADLs: Supervision/safety;Rolling walker General ADL Comments: Educated pt and daughter concerning home safety and fall prevention strategies. Additionally discussed compensatory strategies including head turns and presenting items on R in order to improve functional use of vision.      Vision                 Additional Comments: Pt continues to report blurred vision in L eye which impacted her ability to locate items to the L in the hallway. Improved with head turns to compensate and pt able to independently identify items to the L with this strategy implemented.   Perception     Praxis      Cognition   Behavior During Therapy: WFL for tasks assessed/performed Overall Cognitive Status: Within Functional Limits for tasks assessed                         Exercises     Shoulder Instructions       General Comments      Pertinent  Vitals/ Pain       Pain Assessment: Faces Faces Pain Scale: Hurts a little bit Pain Location: R LE  Pain Descriptors / Indicators: Sore Pain Intervention(s): Monitored during session  Home Living                                          Prior Functioning/Environment              Frequency  Min 3X/week        Progress Toward Goals  OT Goals(current goals can now be found in the care plan section)  Progress towards OT goals: Progressing toward  goals  Acute Rehab OT Goals Patient Stated Goal: Return home when able OT Goal Formulation: With patient Time For Goal Achievement: 03/01/17 Potential to Achieve Goals: Good ADL Goals Pt Will Perform Lower Body Bathing: with modified independence;sit to/from stand Pt Will Perform Lower Body Dressing: with modified independence;sit to/from stand Pt Will Transfer to Toilet: with modified independence;ambulating;bedside commode (BSC over toilet) Pt Will Perform Toileting - Clothing Manipulation and hygiene: with modified independence;sit to/from stand Pt/caregiver will Perform Home Exercise Program: Increased strength;Left upper extremity;With Supervision;With written HEP provided Additional ADL Goal #1: Pt will demonstrate anticipatory awareness during morning ADL routine in order to improve safety with daily self-care tasks.  Plan Discharge plan remains appropriate (Pt's further OT needs can be met via home health OT)    Co-evaluation                 End of Session Equipment Utilized During Treatment: Gait belt;Rolling walker  OT Visit Diagnosis: Unsteadiness on feet (R26.81);Muscle weakness (generalized) (M62.81)   Activity Tolerance Patient tolerated treatment well   Patient Left with call bell/phone within reach;in chair;with chair alarm set   Nurse Communication Mobility status        Time: 1440-1502 OT Time Calculation (min): 22 min  Charges: OT General Charges $OT Visit: 1 Procedure OT Treatments $Self Care/Home Management : 8-22 mins  Norman Herrlich, MS OTR/L  Pager: [place] A Aarya Robinson 02/18/2017, 3:29 PM

## 2017-02-18 NOTE — Discharge Instructions (Signed)

## 2017-02-18 NOTE — Progress Notes (Signed)
Pt discharging at this time with daughter taking all personal belongings. IV discontinued, dry dressing applied. Discharge instructions provided with verbal understanding. Pt and daughter notified about follow up appts. Home health services will contact her at home for needs. Dressings to right groin and ankle clean, dry and intact. No noted distress.

## 2017-02-18 NOTE — Discharge Summary (Addendum)
Physician Discharge Summary  Faith [name] TML:[id number] DOB: Apr 29, 1951  PCP: Philis Fendt, MD  Admit date: 02/14/2017 Discharge date: 02/18/2017  Recommendations for Outpatient Follow-up:  1. Dr. [name], PCP in 5 days with repeat labs (CBC & BMP). Recommend outpatient close follow-up for evaluation and management of lymphadenopathy seen on imaging studies as below. 2. Dr. Antony Contras, Neurology : MDs office will call patient with appointment date and time. 3. Dr. Servando Snare, Vascular Surgery: In 2 weeks. 4. Continue outpatient follow-up weekly with wound care center for management of right postsurgical groin wound and right posterior ankle wound. 5. Continue outpatient follow-up with pain management.  Home Health: PT & OT Equipment/Devices: 3 and 1    Discharge Condition: Improved and stable  CODE STATUS: Full  Diet recommendation: Heart healthy diet.  Discharge Diagnoses:  Principal Problem:   Stroke-like symptom Active Problems:   HTN (hypertension)   Depression   Nonhealing nonsurgical wound   Low potassium syndrome   Brief Summary: 66 year old female with PMH of PAD status post femoral bypass last month, chronic bronchitis, BPD/depression & anxiety/schizophrenia, HTN, HLD, GERD, chronic pain, presented to ED with severe vertiginous symptoms, nausea, vomiting and severe left eye pain and blurred vision. Diagnosed with acute stroke. Neurology consulted.  Assessment and plan  Acute stroke: Scattered punctate acute infarctions in the left MCA-embolic-source unknown.  - Resultant mild right sided weakness and dysarthria. 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 - Carotid Doppler: Results as below. -  2-D echo: LVEF 55-60 percent. - LDL: 80 - Hemoglobin A1c: 4.8 - Patient was on aspirin 81 MG daily prior to admission. As per neurology recommendations, patient presented with vertigo, visual disturbance and left facial numbness but MRI scan shows what appears to be small left watershed infarcts secondary likely to left carotid occlusion. The patient's symptoms at presentation would not match infarcts. She however has significant PVD and it is unclear whether the chronic carotid occlusion is acute or chronic. Neurology recommended aspirin and Plavix for 3 months followed by Plavix alone. They also recommended vascular surgery follow-up as outpatient. - Therapies recommended home health PT, OT and DME. Patient declines CIR or SNF. She states that she has her boyfriend, family/friends who will assist her.  Essential hypertension - Allowed permissive hypertension while hospitalized. Resume home antihypertensives at discharge.  Hyperlipidemia - Was on Lipitor 10 MG daily prior to admission. LDL 80, goal <70. Lipitor increased to 20 MG daily.  Tobacco abuse - Quit smoking 6 weeks ago. Congratulated and encouraged abstinence.  Chronic bronchitis - Stable without clinical bronchospasm.  Hypokalemia - Was severe. This has improved after replacement. Etiology unclear. Continue replacement as outpatient and discussed with patient and family regarding consuming high potassium foods. Follow-up BMP closely as outpatient. Magnesium normal.  Anemia - No bleeding reported. Outpatient follow-up.  THC abuse - UDS positive for THC. Cessation counseled.  Lymphadenopathy, needs further evaluation CT chest on 02/14/17 showed moderate mediastinal adenopathy, adenopathy over right neck base with necrotic nodes over the right cervical chain on recent CT neck and findings could represent a neoplastic process. Few small pulmonary nodules, some improved and some new. In light of adenopathy, metastatic disease is not  excluded. - Differential diagnosis including lymphoproliferative process versus malignancy. - This  needs further close outpatient follow-up and evaluation as deemed necessary and may include lymph node biopsy and/or pulmonology/oncology consultation prior to biopsy. This has been discussed in detail with patient and patient's daughter. They verbalized understanding.  PAD - says sees OP wound care center for management of post operative right groin wound which is clean without acute findings and a tiny wound over her right ankle which also is clean without acute findings.  Chronic pain - States that she follows with pain management as outpatient. Intermittent pruritus while hospitalized? Related to all tram versus Fioricet. Discontinue these. Continue home dose of Percocet at discharge and when necessary Benadryl. No skin rashes noted.     Consultations:  Neurology  Procedures:  2-D echo 02/16/17: Study Conclusions  - Left ventricle: The cavity size was normal. There was mild   concentric hypertrophy. Systolic function was normal. The   estimated ejection fraction was in the range of 55% to 60%. Wall   motion was normal; there were no regional wall motion   abnormalities. Left ventricular diastolic function parameters   were normal. - Aortic valve: Transvalvular velocity was within the normal range.   There was no stenosis. There was no regurgitation. - Mitral valve: Transvalvular velocity was within the normal range.   There was no evidence for stenosis. There was trivial   regurgitation. - Right ventricle: The cavity size was normal. Wall thickness was   normal. Systolic function was normal. - Atrial septum: No defect or patent foramen ovale was identified   by color flow Doppler. - Tricuspid valve: There was trivial regurgitation. - Pulmonary arteries: Systolic pressure was mildly to moderately   increased. PA peak pressure: 47 mm Hg (S).   Carotid Dopplers 02/17/17:  Summary: Findings suggest 1-39% right internal carotid artery stenosis. The left internal carotid artery appears to be patent at its origin, however significantly atypical waveforms suggest distal occlusion. Vertebral arteries are patent with antegrade flow.  Incidental finding: the right lateral neck exhibits multiple heterogenous vascularized areas, largest measuring 2.1cm; suggestive of possible prominent lymph nodes versus unknown etiology.Correlate with Ct/MRI neck soft tissue if clinically necessary   Discharge Instructions  Discharge Instructions    Ambulatory referral to Neurology    Complete by:  As directed    Stroke patient. Dr. Leonie Man prefers follow up in 6 weeks   Call MD for:    Complete by:  As directed    Strokelike symptoms.   Call MD for:  extreme fatigue    Complete by:  As directed    Call MD for:  persistant dizziness or light-headedness    Complete by:  As directed    Call MD for:  severe uncontrolled pain    Complete by:  As directed    Diet - low sodium heart healthy    Complete by:  As directed    Increase activity slowly    Complete by:  As directed        Medication List    STOP taking these medications   ibuprofen 800 MG tablet Commonly known as:  ADVIL,MOTRIN     TAKE these medications   albuterol (2.5 MG/3ML) 0.083% nebulizer solution Commonly known as:  PROVENTIL Take 2.5 mg by nebulization every 6 (six) hours as needed for wheezing or shortness of breath.   albuterol 108 (90 Base) MCG/ACT inhaler Commonly known as:  PROVENTIL HFA;VENTOLIN HFA Inhale 1-2 puffs into the lungs every 6 (six) hours as needed for wheezing or shortness of breath.  amLODipine 5 MG tablet Commonly known as:  NORVASC Take 5 mg by mouth daily.   aspirin EC 325 MG tablet Take 1 tablet (325 mg total) by mouth daily. What changed:  medication strength  how much to take   atorvastatin 10 MG tablet Commonly known as:  LIPITOR Take 2 tablets (20 mg  total) by mouth daily. What changed:  how much to take   benzonatate 100 MG capsule Commonly known as:  TESSALON Take 100 mg by mouth daily as needed for cough.   clopidogrel 75 MG tablet Commonly known as:  PLAVIX Take 1 tablet (75 mg total) by mouth daily. Start taking on:  02/19/2017   diclofenac sodium 1 % Gel Commonly known as:  VOLTAREN Apply 2 g topically daily as needed (pain).   diphenhydrAMINE 25 MG tablet Commonly known as:  BENADRYL Take 2 tablets (50 mg total) by mouth at bedtime as needed for sleep.   fluticasone 50 MCG/ACT nasal spray Commonly known as:  FLONASE Place 2 sprays into both nostrils daily.   furosemide 20 MG tablet Commonly known as:  LASIX Take 20 mg by mouth every other day.   gabapentin 300 MG capsule Commonly known as:  NEURONTIN Take 300 mg by mouth 2 (two) times daily.   LINZESS 290 MCG Caps capsule Generic drug:  linaclotide Take 290-580 mcg by mouth daily. Takes 1-2 caps daily   loratadine 10 MG tablet Commonly known as:  CLARITIN Take 10 mg by mouth daily.   methocarbamol 750 MG tablet Commonly known as:  ROBAXIN Take 750 mg by mouth every 8 (eight) hours as needed for muscle spasms.   mometasone 0.1 % ointment Commonly known as:  ELOCON Apply 1 application topically daily as needed (irritation).   nicotine polacrilex 4 MG gum Commonly known as:  NICORETTE Take 4 mg by mouth as needed for smoking cessation.   omeprazole 20 MG capsule Commonly known as:  PRILOSEC Take 20 mg by mouth 2 (two) times daily before a meal.   oxyCODONE-acetaminophen 10-325 MG tablet Commonly known as:  PERCOCET Take 1 tablet by mouth every 6 (six) hours as needed for pain.   potassium chloride SA 20 MEQ tablet Commonly known as:  K-DUR,KLOR-CON Take 2 tablets (40 mEq total) by mouth daily. Start taking on:  02/19/2017 What changed:  how much to take      Follow-up Information    SETHI,PRAMOD, MD Follow up in 6 week(s).   Specialties:   Neurology, Radiology Why:  stroke clinic. office will call with appt date and time Contact information: [address] [place] [address] [phone]        Philis Fendt, MD. Schedule an appointment as soon as possible for a visit in 5 day(s).   Specialty:  Internal Medicine Why:  To be seen with repeat labs (CBC & BMP). Contact information: [place] [address] [phone]        Servando Snare, MD. Schedule an appointment as soon as possible for a visit in 2 week(s).   Specialties:  Vascular Surgery, Cardiology Contact information: [address] [place] [address] [phone]          Allergies  Allergen Reactions  . Penicillins Itching and Other (See Comments)    REACTION: Yeast infection  Has patient had a PCN  reaction causing immediate rash, facial/tongue/throat swelling, SOB or lightheadedness with hypotension: {no Has patient had a PCN reaction causing severe rash involving mucus membranes or skin necrosis: {  no Has patient had a PCN reaction that required hospitalization no Has patient had a PCN reaction occurring within the last 10 years: {no If all of the above answers are "NO", then may proceed with Cephalosporin use.  Marland Kitchen Percocet [Oxycodone-Acetaminophen] Itching    Takes with benadryl for itching       Procedures/Studies: Ct Angio Head W Or Wo Contrast  Result Date: 02/14/2017 CLINICAL DATA:  Left-sided facial pain EXAM: CT ANGIOGRAPHY HEAD AND NECK TECHNIQUE: Multidetector CT imaging of the head and neck was performed using the standard protocol during bolus administration of intravenous contrast. Multiplanar CT image reconstructions and MIPs were obtained to evaluate the vascular anatomy. Carotid stenosis measurements (when applicable) are obtained utilizing NASCET criteria, using the distal internal carotid diameter as the denominator. CONTRAST:  50 mL Isovue 370 IV COMPARISON:  Same day head CT FINDINGS: CTA  NECK FINDINGS Aortic arch: There is no aneurysm or dissection of the visualized ascending aorta or aortic arch. 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 The visualized proximal subclavian arteries are normal. Right carotid system: The right common carotid origin is widely patent. There is no common carotid or internal carotid artery dissection or aneurysm. No hemodynamically significant stenosis. Left carotid system: The left common carotid origin is widely patent. 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 There is reconstitution at the communicating segment of the intracranial left ICA. Vertebral arteries: The vertebral system is right dominant. The right vertebral artery arises from the right common carotid artery. There is moderate atherosclerotic calcification within the proximal right V1 segment. The left vertebral artery arises from the left subclavian artery without origin stenosis. Both vertebral arteries are normal to their confluence with the basilar artery. Skeleton: There is no bony spinal canal stenosis. No lytic or blastic lesions. Other neck: The nasopharynx is clear. The oropharynx and hypopharynx are normal. The epiglottis is normal. The supraglottic larynx, glottis and subglottic larynx are normal. No retropharyngeal collection. The parapharyngeal spaces are preserved. The parotid and submandibular glands are normal. No sialolithiasis or salivary ductal dilatation. The thyroid gland is normal. There is no cervical lymphadenopathy. Upper chest: There are multiple enlarged upper mediastinal lymph nodes, measuring up to 2.0 cm at right level 4. There is incompletely visualized distortion within the anterior left upper lobe, likely scarring. Review of the MIP images confirms the above findings CTA HEAD FINDINGS Anterior circulation: --Intracranial internal carotid arteries: The proximal intracranial left  internal carotid artery is occluded. There is opacification of the communicating segment via collateral or retrograde flow. There is atherosclerotic calcification of the right ICA lacerum, cavernous and clinoid segments without significant stenosis. --Anterior cerebral arteries: Normal. --Middle cerebral arteries: Normal. --Posterior communicating arteries: Present on the right. Not seen on the left. Posterior circulation: --Posterior cerebral arteries: Normal. --Superior cerebellar arteries: Normal. --Basilar artery: Normal. --Anterior inferior cerebellar arteries: Normal. --Posterior inferior cerebellar arteries: Normal. Venous sinuses: As permitted by contrast timing, patent. Anatomic variants: None Delayed phase: Not performed. Review of the MIP images confirms the above findings IMPRESSION: 1. 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 2. No other intracranial arterial occlusion or high-grade stenosis. 3. Multiple enlarged upper mediastinal lymph nodes measuring up to 2 cm. These are concerning for a lymphoproliferative process or lymphatic spread of malignancy from an unidentified source. 4. 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 Electronically Signed   By: Ulyses Jarred M.D.   On: 02/14/2017 05:10   Ct Angio Neck W Or Wo Contrast  Result Date: 02/14/2017 CLINICAL DATA:  Left-sided facial pain EXAM: CT ANGIOGRAPHY HEAD AND NECK TECHNIQUE: Multidetector CT imaging of the head and neck was performed using the standard protocol during bolus administration of intravenous contrast. Multiplanar CT image reconstructions and MIPs were obtained to evaluate the vascular anatomy. Carotid stenosis measurements (when applicable) are obtained utilizing NASCET criteria, using the distal internal carotid diameter as the denominator.  CONTRAST:  50 mL Isovue 370 IV COMPARISON:  Same day head CT FINDINGS: CTA NECK FINDINGS Aortic arch: There is no aneurysm or dissection of the visualized ascending aorta or aortic arch. 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 The visualized proximal subclavian arteries are normal. Right carotid system: The right common carotid origin is widely patent. There is no common carotid or internal carotid artery dissection or aneurysm. No hemodynamically significant stenosis. Left carotid system: The left common carotid origin is widely patent. 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 There is reconstitution at the communicating segment of the intracranial left ICA. Vertebral arteries: The vertebral system is right dominant. The right vertebral artery arises from the right common carotid artery. There is moderate atherosclerotic calcification within the proximal right V1 segment. The left vertebral artery arises from the left subclavian artery without origin stenosis. Both vertebral arteries are normal to their confluence with the basilar artery. Skeleton: There is no bony spinal canal stenosis. No lytic or blastic lesions. Other neck: The nasopharynx is clear. The oropharynx and hypopharynx are normal. The epiglottis is normal. The supraglottic larynx, glottis and subglottic larynx are normal. No retropharyngeal collection. The parapharyngeal spaces are preserved. The parotid and submandibular glands are normal. No sialolithiasis or salivary ductal dilatation. The thyroid gland is normal. There is no cervical lymphadenopathy. Upper chest: There are multiple enlarged upper mediastinal lymph nodes, measuring up to 2.0 cm at right level 4. There is incompletely visualized distortion within the anterior left upper lobe, likely scarring. Review of the MIP images confirms the above findings CTA HEAD FINDINGS Anterior  circulation: --Intracranial internal carotid arteries: The proximal intracranial left internal carotid artery is occluded. There is opacification of the communicating segment via collateral or retrograde flow. There is atherosclerotic calcification of the right ICA lacerum, cavernous and clinoid segments without significant stenosis. --Anterior cerebral arteries: Normal. --Middle cerebral arteries: Normal. --Posterior communicating arteries: Present on the right. Not seen on the left. Posterior circulation: --Posterior cerebral arteries: Normal. --Superior cerebellar arteries: Normal. --Basilar artery: Normal. --Anterior inferior cerebellar arteries: Normal. --Posterior inferior cerebellar arteries: Normal. Venous sinuses: As permitted by contrast timing, patent. Anatomic variants: None Delayed phase: Not performed. Review of the MIP images confirms the above findings IMPRESSION: 1. 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 2. No other intracranial arterial occlusion or high-grade stenosis. 3. Multiple enlarged upper mediastinal lymph nodes measuring up to 2 cm. These are concerning for a lymphoproliferative process or lymphatic spread of malignancy from an unidentified source. 4. 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 Electronically Signed   By: Ulyses Jarred M.D.   On: 02/14/2017 05:10   Ct Chest Wo Contrast  Result Date: 02/14/2017 CLINICAL DATA:  Mediastinal  adenopathy on recent neck CT. EXAM: CT CHEST WITHOUT CONTRAST TECHNIQUE: Multidetector CT imaging of the chest was performed following the standard protocol without IV contrast. COMPARISON:  Chest CT 12/13/2014 and 05/22/2014 FINDINGS: Cardiovascular: Heart is normal size. There is calcified plaque over the right coronary and left lateral circumflex coronary arteries.  Calcified plaque over the thoracic aorta. Aberrant right subclavian artery. Mediastinum/Nodes: There is evidence of adenopathy over the visualized right neck base with several necrotic nodes over the right neck base/ cervical chain on recent CT. Moderate superior mediastinal/mediastinal adenopathy with the largest node over the right peritracheal region measuring 2.4 cm by short axis. Suggestion mild subcarinal adenopathy. No significant axillary adenopathy. Remaining mediastinal structures are unremarkable. Lungs/Pleura: Lungs are adequately inflated. No focal airspace consolidation or effusion. There are a few small subpleural areas of scarring unchanged. Decrease in size of a small cavitary focus over the lateral left midlung. Stable 3 mm nodule over the left lower lobe. Previously noted 1.6 cm nodule in the right infrahilar region appears smaller in harder to define, although there are 2 small nodules just below this region which were not seen previously measuring 3-4 mm and 6-7 mm respectively. Airways are within normal. Upper Abdomen: Previous cholecystectomy. Stable 1.5 cm cyst over the left lobe of the liver. Calcified plaque over the abdominal aorta. Contrast within the renal collecting systems. Musculoskeletal: Degenerate change of the spine. IMPRESSION: Moderate mediastinal adenopathy as described with the largest lymph node in the right peritracheal region measuring 2.4 cm by short axis. There is also adenopathy over the right neck base with necrotic nodes over the right cervical chain on recent neck CT. Findings likely represent a neoplastic process as consider tissue sampling of 1 of the lower right cervical chain nodes. Few small pulmonary nodules as described some improved and some new. Most likely due to waxing and waning inflammatory process. In light of the above described adenopathy, metastatic disease is not excluded. Largest nodule is present over the right lower lobe inferior to the hilum  measuring 6-7 mm. Recommend followup CT 3 months. Aortic atherosclerosis.  Atherosclerotic coronary artery disease. 1.5 cm liver cyst unchanged. Electronically Signed   By: Marin Olp M.D.   On: 02/14/2017 10:22   Mr Brain Wo Contrast  Result Date: 02/14/2017 CLINICAL DATA:  Acute presentation with vertigo and visual disturbance. Left facial pain. EXAM: MRI HEAD WITHOUT CONTRAST TECHNIQUE: Multiplanar, multiecho pulse sequences of the brain and surrounding structures were obtained without intravenous contrast. COMPARISON:  Multiple CT examinations earlier same day. FINDINGS: Brain: Diffusion imaging shows multiple) approximately 10 punctate foci of restricted diffusion consistent with micro embolic or small watershed infarctions in the left MCA territory affecting the left frontal and parietal cortical and subcortical brain and the deep white matter adjacent to the left lateral ventricle in the left caudate. No large or confluent infarction. The brainstem and cerebellum are normal. There are old lacunar thalamic infarctions. There are old small vessel infarctions in the basal ganglia and cerebral hemispheric white matter. No large vessel territory infarction. No mass lesion, hemorrhage, hydrocephalus or extra-axial collection. Vascular: No antegrade flow in the left cervical internal carotid artery. Skull and upper cervical spine: Chronic C1-2 degenerative changes with spinal stenosis and sub cord deformity. Sinuses/Orbits: Negative Other: None significant IMPRESSION: Scattered punctate acute infarctions in the left MCA territory which could be micro embolic infarctions or watershed infarctions. These are present in the left frontal and parietal cortical and subcortical brain, the left caudate and the left  deep white matter. No large or confluent infarction. No antegrade flow in the distal left cervical ICA as previously demonstrated at CT angiography. Old small vessel infarctions affecting the thalami, basal  ganglia and cerebral hemispheric white matter. Electronically Signed   By: Nelson Chimes M.D.   On: 02/14/2017 10:46   Ct Head Code Stroke W/o Cm  Result Date: 02/14/2017 CLINICAL DATA:  Code stroke.  Dizziness and left facial pain EXAM: CT HEAD WITHOUT CONTRAST TECHNIQUE: Contiguous axial images were obtained from the base of the skull through the vertex without intravenous contrast. COMPARISON:  None. FINDINGS: Brain: No mass lesion, intraparenchymal hemorrhage or extra-axial collection. No evidence of acute cortical infarct. There is an old lacunar infarct in the left thalamus. Vascular: No hyperdense vessel or unexpected calcification. Skull: Normal visualized skull base, calvarium and extracranial soft tissues. Sinuses/Orbits: No sinus fluid levels or advanced mucosal thickening. No mastoid effusion. Normal orbits. ASPECTS [facility] Stroke Program Early CT Score) - Ganglionic level infarction (caudate, lentiform nuclei, internal capsule, insula, M1-M3 cortex): 7 - Supraganglionic infarction (M4-M6 cortex): 3 Total score (0-10 with 10 being normal): 10 IMPRESSION: 1. No acute intracranial abnormality. 2. 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 3. ASPECTS is 10. These results were called by telephone at the time of interpretation on 02/14/2017 at 4:50 am to Dr. Leonel Ramsay, who verbally acknowledged these results. Electronically Signed   By: Ulyses Jarred M.D.   On: 02/14/2017 04:50      Subjective: Patient complains of intermittent itching even at home for which she takes when necessary Benadryl. Thinks that she got something different for pain in the hospital which caused increased pain (was on Ultram). Denies any other complaints. States that she has been ambulating with the help of supervision and a walker. Wishes to go home and declines SNF or CIR. No chest pain or dyspnea reported.  Discharge Exam:  Vitals:   02/18/17 0103 02/18/17 0457 02/18/17 0850 02/18/17 1207  BP: (!) 149/66 (!) 145/80 118/61  127/68  Pulse: (!) 55 (!) 53 (!) 58 61  Resp: '18 18 18 18  '$ Temp: 98.3 F (36.8 C) 98.2 F (36.8 C) 97.8 F (36.6 C) 98.1 F (36.7 C)  TempSrc: Oral Oral Oral Oral  SpO2: 99% 100% 100% 100%  Weight:  66.3 kg (146 lb 2.6 oz)    Height:        General: Pt lying comfortably in bed & appears in no obvious distress. Cardiovascular: S1 & S2 heard, RRR, S1/S2 +. No murmurs, rubs, gallops or clicks. No JVD or pedal edema.Telemetry: Sinus bradycardia in the 50s-SR in the 60s. Respiratory: Clear to auscultation without wheezing, rhonchi or crackles. No increased work of breathing. Abdominal:  Non distended, non tender & soft. No organomegaly or masses appreciated. Normal bowel sounds heard. CNS: Alert and oriented. No focal deficits noted on today's exam. Extremities: no edema, no cyanosis    The results of significant diagnostics from this hospitalization (including imaging, microbiology, ancillary and laboratory) are listed below for reference.     Microbiology: No results found for this or any previous visit (from the past 240 hour(s)).   Labs: CBC:  Recent Labs Lab 02/14/17 0420 02/14/17 0429 02/15/17 0609 02/17/17 0258  WBC 12.1*  --  6.8 8.7  NEUTROABS 5.2  --   --   --   HGB 12.2 12.9 11.4* 11.4*  HCT 36.5 38.0 33.6* 34.0*  MCV 84.3  --  83.4 83.5  PLT 220  --  206 192  Basic Metabolic Panel:  Recent Labs Lab 02/14/17 0425  02/14/17 1849 02/15/17 9021 02/15/17 0610 02/16/17 0658 02/17/17 0258 02/18/17 0729  NA  --   < > 134* 139  --  141 138 141  K  --   < > 2.5* 2.7*  --  2.8* 3.3* 3.5  CL  --   < > 110 117*  --  113* 111 113*  CO2  --   --  16* 16*  --  19* 18* 21*  GLUCOSE  --   < > 120* 128*  --  119* 100* 94  BUN  --   < > 11 8  --  7 5* 6  CREATININE  --   < > 0.55 0.58  --  0.59 0.50 0.56  CALCIUM  --   --  9.0 9.3  --  9.6 9.2 9.8  MG 2.1  --   --   --  1.8  --  1.8  --   PHOS 2.9  --   --   --   --   --   --   --   < > = values in this interval  not displayed. Liver Function Tests:  Recent Labs Lab 02/14/17 0420  AST 16  ALT 11*  ALKPHOS 116  BILITOT 0.4  PROT 7.0  ALBUMIN 3.6   CBG:  Recent Labs Lab 02/14/17 0422  GLUCAP 167*    Discussed in detail with patient and her daughter. Updated care and answered questions.   Time coordinating discharge: Over 30 minutes  SIGNED:  Vernell Leep, MD, FACP, [place]. [facility] Pager [phone]  If 7PM-7AM, please contact night-coverage [email or web address] Password [email or web address] 02/18/2017, 2:17 PM

## 2017-02-18 NOTE — Progress Notes (Signed)
PT Cancellation Note  Patient Details Name: Faith [name] MRN: [id number] DOB: 1951-07-06   Cancelled Treatment:    Reason Eval/Treat Not Completed: Other (comment).  Pt is getting dressed to go home.  Has no questions for acute PT, already practiced stairs yesterday and reports walking with assist in the hallway today.  PT to defer further therapy to [facility].   Thanks,    Barbarann Ehlers. [place], [place], DPT [phone]   02/18/2017, 4:06 PM

## 2017-02-19 DIAGNOSIS — L89612 Pressure ulcer of right heel, stage 2: Secondary | ICD-10-CM | POA: Diagnosis not present

## 2017-02-19 DIAGNOSIS — S31103A Unspecified open wound of abdominal wall, right lower quadrant without penetration into peritoneal cavity, initial encounter: Secondary | ICD-10-CM | POA: Diagnosis not present

## 2017-02-19 DIAGNOSIS — I10 Essential (primary) hypertension: Secondary | ICD-10-CM | POA: Diagnosis not present

## 2017-02-19 DIAGNOSIS — Y838 Other surgical procedures as the cause of abnormal reaction of the patient, or of later complication, without mention of misadventure at the time of the procedure: Secondary | ICD-10-CM | POA: Diagnosis not present

## 2017-02-23 ENCOUNTER — Other Ambulatory Visit: Payer: Self-pay

## 2017-02-23 NOTE — Patient Outreach (Signed)
[facility]) Care Management  02/23/2017  Yovana Martinique September 17, 1951 [id number]   Referral Date:  02/23/17 Source:  Emmi Stroke Issue: Feeling worse all over?  Yes  THN eligible:  No Insurance:  [facility] Admission:  02/14/2017 - 02/18/2017  Stroke-like symptom.  H/o discharged to home with PT/OT services with [place].   Outreach call #1 to patient; not reached.  RN CM left HIPAA compliant voice message with name and number.  RN CM scheduled for next outreach call within one week.   Nathaneil Canary, BSN, RN, [place] Care Management Care Management Coordinator [phone] Direct [phone] Cell [phone] Office [phone] Fax [email or web address]

## 2017-02-23 NOTE — Patient Outreach (Signed)
[facility]) Care Management  02/23/2017  Faith [name] 09-22-1951 [id number]   Patient triggered RED on EMMI Stroke Dashboard, notification sent to:  Mariann Laster, RN

## 2017-02-24 ENCOUNTER — Other Ambulatory Visit: Payer: Self-pay

## 2017-02-24 ENCOUNTER — Telehealth: Payer: Self-pay | Admitting: Vascular Surgery

## 2017-02-24 NOTE — Patient Outreach (Signed)
[facility]) Care Management  02/24/2017  Jalisia Martinique 23-Feb-1951 [id number]  Emmi Stroke  Referral Date:  02/23/17 Source:  Emmi Stroke Issue: Feeling worse all over?  Yes  THN eligible:  No Insurance:  [facility] Admission:  02/14/2017 - 02/18/2017  Stroke-like symptom.  H/o discharged to home with PT/OT services with   Outreach call #2 to patient.  Patient reached and completed call. [place] [phone] (M) Patient states she is doing well and doing / feeling better since discharge.  States she has MD follow-up appt today.  States her daughter will provide transportation.  States [place] services are active for PT services with [place].  Ambulating with cane or walker as needed.  Denies any safety concerns or falls.  Patient lives alone but has a friend staying over at night.  Patient has no questions/concerns.    Encounter Medications:  Outpatient Encounter Prescriptions as of 02/24/2017  Medication Sig Note  . albuterol (PROVENTIL HFA;VENTOLIN HFA) 108 (90 Base) MCG/ACT inhaler Inhale 1-2 puffs into the lungs every 6 (six) hours as needed for wheezing or shortness of breath.   Marland Kitchen albuterol (PROVENTIL) (2.5 MG/3ML) 0.083% nebulizer solution Take 2.5 mg by nebulization every 6 (six) hours as needed for wheezing or shortness of breath.   Marland Kitchen amLODipine (NORVASC) 5 MG tablet Take 5 mg by mouth daily.   Marland Kitchen aspirin EC 325 MG tablet Take 1 tablet (325 mg total) by mouth daily.   Marland Kitchen atorvastatin (LIPITOR) 10 MG tablet Take 2 tablets (20 mg total) by mouth daily.   . benzonatate (TESSALON) 100 MG capsule Take 100 mg by mouth daily as needed for cough.   . clopidogrel (PLAVIX) 75 MG tablet Take 1 tablet (75 mg total) by mouth daily.   . diclofenac sodium (VOLTAREN) 1 % GEL Apply 2 g topically daily as needed (pain).    Marland Kitchen diphenhydrAMINE (BENADRYL) 25 MG tablet Take 2 tablets (50 mg total) by mouth at bedtime as needed for  sleep. 02/14/2017: Takes with percocet for itching sometimes  . fluticasone (FLONASE) 50 MCG/ACT nasal spray Place 2 sprays into both nostrils daily.    . furosemide (LASIX) 20 MG tablet Take 20 mg by mouth every other day.    . gabapentin (NEURONTIN) 300 MG capsule Take 300 mg by mouth 2 (two) times daily.    . Linaclotide (LINZESS) 290 MCG CAPS capsule Take 290-580 mcg by mouth daily. Takes 1-2 caps daily   . loratadine (CLARITIN) 10 MG tablet Take 10 mg by mouth daily.   . methocarbamol (ROBAXIN) 750 MG tablet Take 750 mg by mouth every 8 (eight) hours as needed for muscle spasms.    . mometasone (ELOCON) 0.1 % ointment Apply 1 application topically daily as needed (irritation).    . nicotine polacrilex (NICORETTE) 4 MG gum Take 4 mg by mouth as needed for smoking cessation.   Marland Kitchen omeprazole (PRILOSEC) 20 MG capsule Take 20 mg by mouth 2 (two) times daily before a meal.   . oxyCODONE-acetaminophen (PERCOCET) 10-325 MG tablet Take 1 tablet by mouth every 6 (six) hours as needed for pain.   . potassium chloride SA (K-DUR,KLOR-CON) 20 MEQ tablet Take 2 tablets (40 mEq total) by mouth daily.    No facility-administered encounter medications on file as of 02/24/2017.      Plan: RN CM advised patient will continue to get Emmi Stroke calls and nurse phone call according to answers on  calls.   Mariann Laster, MSHL, BSN, RN, [place] Management Care Management Coordinator [phone] Direct [phone] Cell [phone] Office [phone] Fax [email or web address] [email or web address]

## 2017-02-24 NOTE — Telephone Encounter (Signed)
-----   Message from Mena Goes, RN sent at 02/24/2017  3:04 PM EDT ----- Regarding: FW: Appt Question/Scheduling Contact: [phone] Her discharge summary says 2 weeks with Dr. Donzetta Matters, according to it she has new ulcers on her leg  ----- Message ----- From: Lujean Amel Sent: 02/24/2017  10:11 AM To: [place] Records, RN, Madalyn Rob, LPN, # Subject: Appt Question/Scheduling                       Good morning! This patient's daughter Salli Real called this morning requesting to schedule a hospital fu appt w/ Dr.Cain. She stated she spoke to someone last week but wasn't sure of who it may have been. The patient had a stroke on 02/14/17 was admitted to [facility] and they did a carotid US on 02/17/17. Please advise how I should schedule her post hospital fu. She is an established patient of [facility] for [facility]. Thanks, Anne Ng

## 2017-02-24 NOTE — Telephone Encounter (Signed)
Per Kay's instructions, I scheduled an appt for the patient to see Dr.Cain on 03/13/17 at 3:45pm.  I left a voicemail for her daughter [name] and also mailed an appt letter. awt

## 2017-02-26 DIAGNOSIS — L89612 Pressure ulcer of right heel, stage 2: Secondary | ICD-10-CM | POA: Diagnosis not present

## 2017-03-05 ENCOUNTER — Encounter: Payer: Self-pay | Admitting: Vascular Surgery

## 2017-03-05 ENCOUNTER — Encounter (HOSPITAL_BASED_OUTPATIENT_CLINIC_OR_DEPARTMENT_OTHER): Payer: Medicaid Other | Attending: Internal Medicine

## 2017-03-05 DIAGNOSIS — I10 Essential (primary) hypertension: Secondary | ICD-10-CM | POA: Insufficient documentation

## 2017-03-05 DIAGNOSIS — W19XXXA Unspecified fall, initial encounter: Secondary | ICD-10-CM | POA: Insufficient documentation

## 2017-03-05 DIAGNOSIS — L89612 Pressure ulcer of right heel, stage 2: Secondary | ICD-10-CM | POA: Diagnosis not present

## 2017-03-05 DIAGNOSIS — S91311A Laceration without foreign body, right foot, initial encounter: Secondary | ICD-10-CM | POA: Diagnosis present

## 2017-03-13 ENCOUNTER — Ambulatory Visit: Payer: Medicaid Other | Admitting: Vascular Surgery

## 2017-03-20 DIAGNOSIS — S91311A Laceration without foreign body, right foot, initial encounter: Secondary | ICD-10-CM | POA: Diagnosis not present

## 2017-03-26 ENCOUNTER — Encounter: Payer: Self-pay | Admitting: Vascular Surgery

## 2017-03-26 DIAGNOSIS — S91311A Laceration without foreign body, right foot, initial encounter: Secondary | ICD-10-CM | POA: Diagnosis not present

## 2017-04-02 ENCOUNTER — Encounter (HOSPITAL_BASED_OUTPATIENT_CLINIC_OR_DEPARTMENT_OTHER): Payer: Medicaid Other | Attending: Internal Medicine

## 2017-04-02 DIAGNOSIS — L89612 Pressure ulcer of right heel, stage 2: Secondary | ICD-10-CM | POA: Insufficient documentation

## 2017-04-02 DIAGNOSIS — I10 Essential (primary) hypertension: Secondary | ICD-10-CM | POA: Insufficient documentation

## 2017-04-02 DIAGNOSIS — G40909 Epilepsy, unspecified, not intractable, without status epilepticus: Secondary | ICD-10-CM | POA: Insufficient documentation

## 2017-04-02 DIAGNOSIS — S91311D Laceration without foreign body, right foot, subsequent encounter: Secondary | ICD-10-CM | POA: Insufficient documentation

## 2017-04-02 DIAGNOSIS — M1990 Unspecified osteoarthritis, unspecified site: Secondary | ICD-10-CM | POA: Insufficient documentation

## 2017-04-02 DIAGNOSIS — F411 Generalized anxiety disorder: Secondary | ICD-10-CM | POA: Insufficient documentation

## 2017-04-02 DIAGNOSIS — X58XXXD Exposure to other specified factors, subsequent encounter: Secondary | ICD-10-CM | POA: Insufficient documentation

## 2017-04-05 ENCOUNTER — Other Ambulatory Visit: Payer: Self-pay | Admitting: Vascular Surgery

## 2017-04-08 ENCOUNTER — Encounter: Payer: Self-pay | Admitting: Vascular Surgery

## 2017-04-08 ENCOUNTER — Ambulatory Visit (INDEPENDENT_AMBULATORY_CARE_PROVIDER_SITE_OTHER): Payer: Medicaid Other | Admitting: Vascular Surgery

## 2017-04-08 VITALS — BP 121/69 | HR 80 | Temp 98.4°F | Resp 18 | Ht 61.0 in | Wt 137.6 lb

## 2017-04-08 DIAGNOSIS — I739 Peripheral vascular disease, unspecified: Secondary | ICD-10-CM

## 2017-04-08 NOTE — Progress Notes (Signed)
Subjective:     Patient ID: Faith [name], female   DOB: December 22, 1950, 66 y.o.   MRN: [id number]  HPI 66 year old female is status post right femoral to popliteal artery bypass grafting for wound which has subsequently healed. 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 She has done well that she does have some swelling in her right lower extremity. Her surgical wounds have all healed including R groin wound which previous had some fibrous exudate. Other than weakness from her stroke she is progressing as expected.   Review of Systems Residual right sided weakness Swelling of right leg    Objective:   Physical Exam aaox3 Non labored respirations Palpable dp on right Well healed incisions rle 1+ edema of right leg    Assessment/plan     66 year old female follows up from recent right lower extremity bypass for wound which have now healed. She has had a stroke and is now here for follow-up. She can keep her follow-up appointment in 1 month with right lower extremity duplex and ABIs. She remains on aspirin and statin. Have also recommended compression stockings to help with the swelling which will hopefully resolve with time.    Ariya Bohannon C. Donzetta Matters, MD [facility] Office: [phone] Pager: [phone]

## 2017-04-17 DIAGNOSIS — I10 Essential (primary) hypertension: Secondary | ICD-10-CM | POA: Diagnosis not present

## 2017-04-17 DIAGNOSIS — G40909 Epilepsy, unspecified, not intractable, without status epilepticus: Secondary | ICD-10-CM | POA: Diagnosis not present

## 2017-04-17 DIAGNOSIS — F411 Generalized anxiety disorder: Secondary | ICD-10-CM | POA: Diagnosis not present

## 2017-04-17 DIAGNOSIS — L89612 Pressure ulcer of right heel, stage 2: Secondary | ICD-10-CM | POA: Diagnosis not present

## 2017-04-17 DIAGNOSIS — S91311D Laceration without foreign body, right foot, subsequent encounter: Secondary | ICD-10-CM | POA: Diagnosis not present

## 2017-04-17 DIAGNOSIS — X58XXXD Exposure to other specified factors, subsequent encounter: Secondary | ICD-10-CM | POA: Diagnosis not present

## 2017-04-17 DIAGNOSIS — M1990 Unspecified osteoarthritis, unspecified site: Secondary | ICD-10-CM | POA: Diagnosis not present

## 2017-05-05 ENCOUNTER — Ambulatory Visit (INDEPENDENT_AMBULATORY_CARE_PROVIDER_SITE_OTHER): Payer: Medicaid Other | Admitting: Neurology

## 2017-05-05 ENCOUNTER — Encounter: Payer: Self-pay | Admitting: Neurology

## 2017-05-05 VITALS — BP 114/70 | HR 87 | Ht 61.0 in | Wt 133.4 lb

## 2017-05-05 DIAGNOSIS — I6389 Other cerebral infarction: Secondary | ICD-10-CM

## 2017-05-05 DIAGNOSIS — I638 Other cerebral infarction: Secondary | ICD-10-CM | POA: Diagnosis not present

## 2017-05-05 NOTE — Patient Instructions (Signed)
I had a long d/w patient about her recent watershed strokes from left carotid occlusion, risk for recurrent stroke/TIAs, personally independently reviewed imaging studies and stroke evaluation results and answered questions.Continue aspirin 325 mg daily and clopidogrel 75 mg daily  for secondary stroke prevention  Given significant peripheral vascular disease as well and maintain strict control of hypertension with blood pressure goal below 130/90, diabetes with hemoglobin A1c goal below 6.5% and lipids with LDL cholesterol goal below 70 mg/dL. I also advised the patient to eat a healthy diet with plenty of whole grains, cereals, fruits and vegetables, exercise regularly and maintain ideal body weight. I counseled her to quit smoking completely. She may also consider possible participation in the [place] stroke prevention study if interested. Followup in the future with my nurse practitioner in 6 months or call earlier if necessary  Stroke Prevention Some medical conditions and behaviors are associated with an increased chance of having a stroke. You may prevent a stroke by making healthy choices and managing medical conditions. How can I reduce my risk of having a stroke?  Stay physically active. Get at least 30 minutes of activity on most or all days.  Do not smoke. It may also be helpful to avoid exposure to secondhand smoke.  Limit alcohol use. Moderate alcohol use is considered to be: ? No more than 2 drinks per day for men. ? No more than 1 drink per day for nonpregnant women.  Eat healthy foods. This involves: ? Eating 5 or more servings of fruits and vegetables a day. ? Making dietary changes that address high blood pressure (hypertension), high cholesterol, diabetes, or obesity.  Manage your cholesterol levels. ? Making food choices that are high in fiber and low in saturated fat, trans fat, and cholesterol may control cholesterol levels. ? Take any prescribed medicines to control  cholesterol as directed by your health care provider.  Manage your diabetes. ? Controlling your carbohydrate and sugar intake is recommended to manage diabetes. ? Take any prescribed medicines to control diabetes as directed by your health care provider.  Control your hypertension. ? Making food choices that are low in salt (sodium), saturated fat, trans fat, and cholesterol is recommended to manage hypertension. ? Ask your health care provider if you need treatment to lower your blood pressure. Take any prescribed medicines to control hypertension as directed by your health care provider. ? If you are 32-54 years of age, have your blood pressure checked every 3-5 years. If you are 33 years of age or older, have your blood pressure checked every year.  Maintain a healthy weight. ? Reducing calorie intake and making food choices that are low in sodium, saturated fat, trans fat, and cholesterol are recommended to manage weight.  Stop drug abuse.  Avoid taking birth control pills. ? Talk to your health care provider about the risks of taking birth control pills if you are over 54 years old, smoke, get migraines, or have ever had a blood clot.  Get evaluated for sleep disorders (sleep apnea). ? Talk to your health care provider about getting a sleep evaluation if you snore a lot or have excessive sleepiness.  Take medicines only as directed by your health care provider. ? For some people, aspirin or blood thinners (anticoagulants) are helpful in reducing the risk of forming abnormal blood clots that can lead to stroke. If you have the irregular heart rhythm of atrial fibrillation, you should be on a blood thinner unless there is a good reason  you cannot take them. ? Understand all your medicine instructions.  Make sure that other conditions (such as anemia or atherosclerosis) are addressed. Get help right away if:  You have sudden weakness or numbness of the face, arm, or leg, especially on  one side of the body.  Your face or eyelid droops to one side.  You have sudden confusion.  You have trouble speaking (aphasia) or understanding.  You have sudden trouble seeing in one or both eyes.  You have sudden trouble walking.  You have dizziness.  You have a loss of balance or coordination.  You have a sudden, severe headache with no known cause.  You have new chest pain or an irregular heartbeat. Any of these symptoms may represent a serious problem that is an emergency. Do not wait to see if the symptoms will go away. Get medical help at once. Call your local emergency services (911 in [place] Do not drive yourself to the hospital. This information is not intended to replace advice given to you by your health care provider. Make sure you discuss any questions you have with your health care provider. Document Released: 12/25/2004 Document Revised: 04/24/2016 Document Reviewed: 05/20/2013 Elsevier Interactive Patient Education  2017 [employer].

## 2017-05-05 NOTE — Progress Notes (Signed)
[facility] [address] [place]. [place] [address] [phone]       OFFICE FOLLOW-UP NOTE  [name] Date of Birth:  1951-01-02 Medical Record Number:  [id number]   HPI: [name] is a pleasant 66 year old African-American lady is seen today for first office follow-up visit following hospital admission for stroke in March 2018. History is up 10 from the patient, review of Hospital medical records and have personally reviewed imaging films. Faith [name] is a 66 y.o. female with a history of peripheral artery disease who underwent a femoral bypass last month. She was in her normal state of health on laying down around 11:30 PM on 02/13/17, however on awakening shortly after 3 AM  next day she was severely vertiginous. She had nausea and vomiting. She also has severe left eye pain and blurred vision. CT scan of the head on admission showed a old left thalamus lacunar infarct. 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 There was a incidental multiple enlarged upper mediastinal lymph nodes noted. This was confirmed subsequently with a CT scan of the chest. MRI scan of the brain confirms scattered punctate acute infarcts in the left frontal, parietal cortical as well as subcortical region and left caudate and deep white matter. Transthoracic echo showed normal ejection fraction. LDL cholesterol was 80 mg percent and hemoglobin A1c was 4.8. Patient was started on aspirin and Plavix as well as Lipitor and advised to quit smoking. She states she's done well since discharge her speech seems to have improved she still has some mild right-sided weakness. Is also having right foot swelling. She is seen vascular surgeon Dr. Donzetta Matters who plans  to outpatient LE arterial Doppler on her. She has cut back smoking but still occasionally sneaks in a few cigarettes with friends. She is tolerating aspirin and Plavix  without significant bleeding and only mild bruising. She still drags her right leg while walking but feels confident with a cane. She has had no falls or injuries. ROS:   14 system review of systems is positive for  memory loss, weakness, slurred speech, tremor, joint pain, aching muscles, decreased energy, sleepiness and all other systems negative  PMH:  Past Medical History:  Diagnosis Date  . Anxiety   . Arthritis    "thighs; legs; hips" (07/05/2014)  . Bipolar disorder ([place])   . Cholelithiasis 07/2014  . Chronic bronchitis ([place])    "get it q yr"  . Chronic lower back pain   . Depression    pt. use to go to depression clinic, states she still has depression & anxiety but can't get to appt. so she hasn't had any med. for it in a while   . Dyspnea   . GERD (gastroesophageal reflux disease)   . High cholesterol   . Hypertension   . Peripheral vascular disease ([place])   . Schizophrenia ([place])   . Stroke [facility])     Social History:  Social History   Social History  . Marital status: Single    Spouse name: N/A  . Number of children: N/A  . Years of education: N/A   Occupational History  . Not on file.   Social History Main Topics  . Smoking status: Former Smoker    Packs/day: 1.50    Years: 48.00    Types: Cigarettes    Quit date: 12/31/2016  . Smokeless tobacco: Never Used  . Alcohol use 1.2 oz/week  2 Cans of beer per week     Comment:  only on the weekend  . Drug use: No     Comment: 15-20 yrs. ago- used marijuana   . Sexual activity: Yes    Birth control/ protection: Post-menopausal   Other Topics Concern  . Not on file   Social History Narrative  . No narrative on file    Medications:   Current Outpatient Prescriptions on File Prior to Visit  Medication Sig Dispense Refill  . albuterol (PROVENTIL HFA;VENTOLIN HFA) 108 (90 Base) MCG/ACT inhaler Inhale 1-2 puffs into the lungs every 6 (six) hours as needed for wheezing or shortness of breath.    Marland Kitchen albuterol  (PROVENTIL) (2.5 MG/3ML) 0.083% nebulizer solution Take 2.5 mg by nebulization every 6 (six) hours as needed for wheezing or shortness of breath.    Marland Kitchen amLODipine (NORVASC) 5 MG tablet Take 5 mg by mouth daily.    Marland Kitchen aspirin EC 325 MG tablet Take 1 tablet (325 mg total) by mouth daily. 30 tablet 0  . atorvastatin (LIPITOR) 10 MG tablet take 1 tablet by mouth once daily 30 tablet 0  . benzonatate (TESSALON) 100 MG capsule Take 100 mg by mouth daily as needed for cough.    . clopidogrel (PLAVIX) 75 MG tablet Take 1 tablet (75 mg total) by mouth daily. 30 tablet 0  . diclofenac sodium (VOLTAREN) 1 % GEL Apply 2 g topically daily as needed (pain).     Marland Kitchen diphenhydrAMINE (BENADRYL) 25 MG tablet Take 2 tablets (50 mg total) by mouth at bedtime as needed for sleep. 20 tablet 0  . fluticasone (FLONASE) 50 MCG/ACT nasal spray Place 2 sprays into both nostrils daily.   0  . furosemide (LASIX) 20 MG tablet Take 20 mg by mouth every other day.     . gabapentin (NEURONTIN) 300 MG capsule Take 300 mg by mouth 2 (two) times daily.     . Linaclotide (LINZESS) 290 MCG CAPS capsule Take 290-580 mcg by mouth daily. Takes 1-2 caps daily    . loratadine (CLARITIN) 10 MG tablet Take 10 mg by mouth daily.    . methocarbamol (ROBAXIN) 750 MG tablet Take 750 mg by mouth every 8 (eight) hours as needed for muscle spasms.     . mometasone (ELOCON) 0.1 % ointment Apply 1 application topically daily as needed (irritation).     . nicotine polacrilex (NICORETTE) 4 MG gum Take 4 mg by mouth as needed for smoking cessation.    Marland Kitchen omeprazole (PRILOSEC) 20 MG capsule Take 20 mg by mouth 2 (two) times daily before a meal.    . oxyCODONE-acetaminophen (PERCOCET) 10-325 MG tablet Take 1 tablet by mouth every 6 (six) hours as needed for pain. 30 tablet 0  . potassium chloride SA (K-DUR,KLOR-CON) 20 MEQ tablet Take 2 tablets (40 mEq total) by mouth daily. 30 tablet 0   No current facility-administered medications on file prior to visit.       Allergies:   Allergies  Allergen Reactions  . Penicillins Itching and Other (See Comments)    REACTION: Yeast infection  Has patient had a PCN  reaction causing immediate rash, facial/tongue/throat swelling, SOB or lightheadedness with hypotension: {no Has patient had a PCN reaction causing severe rash involving mucus membranes or skin necrosis: {no Has patient had a PCN reaction that required hospitalization no Has patient had a PCN reaction occurring within the last 10 years: {no If all of the above answers are "NO", then may proceed  with Cephalosporin use.  Marland Kitchen Percocet [Oxycodone-Acetaminophen] Itching    Takes with benadryl for itching     Physical Exam General: well developed, well nourished, seated, in no evident distress Head: head normocephalic and atraumatic.  Neck: supple with no carotid or supraclavicular bruits Cardiovascular: regular rate and rhythm, no murmurs Musculoskeletal: no deformity Skin:  no rash/petichiae Vascular:  Normal pulses all extremities Vitals:   05/05/17 1313  BP: 114/70  Pulse: 87   Neurologic Exam Mental Status: Awake and fully alert. Oriented to place and time. Recent and remote memory intact. Attention span, concentration and fund of knowledge appropriate. Mood and affect appropriate.  Cranial Nerves: Fundoscopic exam reveals sharp disc margins. Pupils equal, briskly reactive to light. Extraocular movements full without nystagmus. Visual fields full to confrontation. Hearing intact. Facial sensation intact. Face, tongue, palate moves normally and symmetrically.  Motor: Normal bulk and tone. Normal strength in all tested extremity muscles Except mild weakness of right hip vigorous and ankle dorsiflexors. Tone slightly increase in the right leg.. Sensory.: intact to touch ,pinprick .position and vibratory sensation.  Coordination: Rapid alternating movements normal in all extremities. Finger-to-nose and heel-to-shin performed accurately  bilaterally. Gait and Station: Arises from chair without difficulty. Stance is normal. Uses a cane. Retract the right leg slightly when walking. Unsteady on a narrow base with right leg unsupported. Nota ble to heel, toe and tandem walk without difficulty.  Reflexes: 1+ and symmetric. Toes downgoing.   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 Multiple vascular risk factors of peripheral vascular disease, smoking, hypertension and hyperlipidemia    PLAN: I had a long d/w patient about her recent watershed strokes from left carotid occlusion, risk for recurrent stroke/TIAs, personally independently reviewed imaging studies and stroke evaluation results and answered questions.Continue aspirin 325 mg daily and clopidogrel 75 mg daily  for secondary stroke prevention  Given significant peripheral vascular disease as well and maintain strict control of hypertension with blood pressure goal below 130/90, diabetes with hemoglobin A1c goal below 6.5% and lipids with LDL cholesterol goal below 70 mg/dL. I also advised the patient to eat a healthy diet with plenty of whole grains, cereals, fruits and vegetables, exercise regularly and maintain ideal body weight. I counseled her to quit smoking completely. She may also consider possible participation in the [place] stroke prevention study if interested. Followup in the future with my nurse practitioner in 6 months or call earlier if necessary Greater than 50% of time during this 25 minute visit was spent on counseling,explanation of diagnosis of "watershed infarcts, chronic left carotid occlusion, planning of further management, discussion with patient and family and coordination of care Antony Contras, MD Medical Director [place] Pager: [phone] 05/05/2017 1:39 PM Note: This document was prepared with digital  dictation and possible smart phrase technology. Any transcriptional errors that result from this process are unintentional

## 2017-05-07 ENCOUNTER — Encounter: Payer: Self-pay | Admitting: Vascular Surgery

## 2017-05-15 ENCOUNTER — Ambulatory Visit (HOSPITAL_COMMUNITY)
Admission: RE | Admit: 2017-05-15 | Discharge: 2017-05-15 | Disposition: A | Discharge status: Home / Self Care | Payer: Medicaid Other | Source: Ambulatory Visit | Attending: Vascular Surgery | Admitting: Vascular Surgery

## 2017-05-15 ENCOUNTER — Ambulatory Visit: Payer: Medicaid Other | Admitting: Vascular Surgery

## 2017-05-15 ENCOUNTER — Ambulatory Visit (INDEPENDENT_AMBULATORY_CARE_PROVIDER_SITE_OTHER)
Admission: RE | Admit: 2017-05-15 | Discharge: 2017-05-15 | Disposition: A | Discharge status: Home / Self Care | Payer: Medicaid Other | Source: Ambulatory Visit | Attending: Vascular Surgery | Admitting: Vascular Surgery

## 2017-05-15 DIAGNOSIS — I739 Peripheral vascular disease, unspecified: Secondary | ICD-10-CM | POA: Insufficient documentation

## 2017-05-22 ENCOUNTER — Encounter: Payer: Self-pay | Admitting: Vascular Surgery

## 2017-05-22 ENCOUNTER — Encounter (HOSPITAL_COMMUNITY): Payer: Medicaid Other

## 2017-05-22 ENCOUNTER — Ambulatory Visit: Payer: Medicaid Other | Admitting: Vascular Surgery

## 2017-05-22 ENCOUNTER — Ambulatory Visit (INDEPENDENT_AMBULATORY_CARE_PROVIDER_SITE_OTHER): Payer: Medicaid Other | Admitting: Vascular Surgery

## 2017-05-22 VITALS — BP 139/80 | HR 70 | Temp 97.7°F | Resp 14 | Ht 61.0 in | Wt 129.0 lb

## 2017-05-22 DIAGNOSIS — I739 Peripheral vascular disease, unspecified: Secondary | ICD-10-CM | POA: Diagnosis not present

## 2017-05-22 NOTE — Progress Notes (Signed)
Patient ID: Faith [name], female   DOB: 1951/07/10, 66 y.o.   MRN: [id number]  Reason for Consult: PAD (f/u)   Referred by [name], MD  Subjective:     HPI:  Faith [name] is a 66 y.o. female with history of right foot wound underwent bypass femoral-popliteal with graft in February of this year. Unfortunately between then and now she has had a stroke although mostly recovered. She continues on aspirin and Plavix. Her only complaints about her leg today are swelling and some numbness of the foot which have been present for many months now. She is walking with 8 of a cane. She is very satisfied that her wounds have healed. Her incisions have healed as well. She has no new complaints related to today's visit.  Past Medical History:  Diagnosis Date  . Anxiety   . Arthritis    "thighs; legs; hips" (07/05/2014)  . Bipolar disorder ([place])   . Cholelithiasis 07/2014  . Chronic bronchitis ([place])    "get it q yr"  . Chronic lower back pain   . Depression    pt. use to go to depression clinic, states she still has depression & anxiety but can't get to appt. so she hasn't had any med. for it in a while   . Dyspnea   . GERD (gastroesophageal reflux disease)   . High cholesterol   . Hypertension   . Peripheral vascular disease ([place])   . Schizophrenia ([place])   . Stroke [facility])    Family History  Problem Relation Age of Onset  . Cancer Mother 17       colon  . Diabetes Mother   . Hypertension Mother   . Cancer Father   . Diabetes Father   . Hypertension Father    Past Surgical History:  Procedure Laterality Date  . ABDOMINAL AORTOGRAM N/A 01/07/2017   Procedure: Abdominal Aortogram;  Surgeon: Waynetta Sandy, MD;  Location: [facility];  Service: Cardiovascular;  Laterality: N/A;  . ABDOMINAL HYSTERECTOMY    . ANKLE FRACTURE SURGERY Right   . ANKLE HARDWARE REMOVAL Right   . APPENDECTOMY  ~ 1963  . BREAST BIOPSY Bilateral   . BREAST CYST EXCISION Bilateral    "not  cancer"  . CATARACT EXTRACTION W/ INTRAOCULAR LENS  IMPLANT, BILATERAL    . CHOLECYSTECTOMY N/A 07/05/2014   Procedure: LAPAROSCOPIC CHOLECYSTECTOMY WITH INTRAOPERATIVE CHOLANGIOGRAM;  Surgeon: Gayland Curry, MD;  Location: [facility];  Service: General;  Laterality: N/A;  . EXCISIONAL HEMORRHOIDECTOMY    . FEMORAL-POPLITEAL BYPASS GRAFT Right 01/15/2017   Procedure: BYPASS GRAFT FEMORAL-POPLITEAL ARTERY RIGHT LEG;  Surgeon: Waynetta Sandy, MD;  Location: [facility];  Service: Vascular;  Laterality: Right;  . LAPAROSCOPIC CHOLECYSTECTOMY  07/05/2014  . LOWER EXTREMITY ANGIOGRAPHY Bilateral 01/07/2017   Procedure: Lower Extremity Angiography;  Surgeon: Waynetta Sandy, MD;  Location: [facility];  Service: Cardiovascular;  Laterality: Bilateral;  . MULTIPLE TOOTH EXTRACTIONS  05/2013   "pulled my upper teeth"  . PILONIDAL CYST EXCISION      Short Social History:  Social History  Substance Use Topics  . Smoking status: Former Smoker    Packs/day: 1.50    Years: 48.00    Types: Cigarettes    Quit date: 12/31/2016  . Smokeless tobacco: Never Used  . Alcohol use 1.2 oz/week    2 Cans of beer per week     Comment:  only on the weekend    Allergies  Allergen  Reactions  . Penicillins Itching and Other (See Comments)    REACTION: Yeast infection  Has patient had a PCN  reaction causing immediate rash, facial/tongue/throat swelling, SOB or lightheadedness with hypotension: {no Has patient had a PCN reaction causing severe rash involving mucus membranes or skin necrosis: {no Has patient had a PCN reaction that required hospitalization no Has patient had a PCN reaction occurring within the last 10 years: {no If all of the above answers are "NO", then may proceed with Cephalosporin use.  Marland Kitchen Percocet [Oxycodone-Acetaminophen] Itching    Takes with benadryl for itching     Current Outpatient Prescriptions  Medication Sig Dispense Refill  . albuterol (PROVENTIL HFA;VENTOLIN HFA)  108 (90 Base) MCG/ACT inhaler Inhale 1-2 puffs into the lungs every 6 (six) hours as needed for wheezing or shortness of breath.    Marland Kitchen albuterol (PROVENTIL) (2.5 MG/3ML) 0.083% nebulizer solution Take 2.5 mg by nebulization every 6 (six) hours as needed for wheezing or shortness of breath.    Marland Kitchen amLODipine (NORVASC) 5 MG tablet Take 5 mg by mouth daily.    Marland Kitchen aspirin EC 325 MG tablet Take 1 tablet (325 mg total) by mouth daily. 30 tablet 0  . atorvastatin (LIPITOR) 10 MG tablet take 1 tablet by mouth once daily 30 tablet 0  . benzonatate (TESSALON) 100 MG capsule Take 100 mg by mouth daily as needed for cough.    . clopidogrel (PLAVIX) 75 MG tablet Take 1 tablet (75 mg total) by mouth daily. 30 tablet 0  . diclofenac sodium (VOLTAREN) 1 % GEL Apply 2 g topically daily as needed (pain).     Marland Kitchen diphenhydrAMINE (BENADRYL) 25 MG tablet Take 2 tablets (50 mg total) by mouth at bedtime as needed for sleep. 20 tablet 0  . fluticasone (FLONASE) 50 MCG/ACT nasal spray Place 2 sprays into both nostrils daily.   0  . furosemide (LASIX) 20 MG tablet Take 20 mg by mouth every other day.     . gabapentin (NEURONTIN) 300 MG capsule Take 300 mg by mouth 2 (two) times daily.     . Linaclotide (LINZESS) 290 MCG CAPS capsule Take 290-580 mcg by mouth daily. Takes 1-2 caps daily    . loratadine (CLARITIN) 10 MG tablet Take 10 mg by mouth daily.    . methocarbamol (ROBAXIN) 750 MG tablet Take 750 mg by mouth every 8 (eight) hours as needed for muscle spasms.     . mometasone (ELOCON) 0.1 % ointment Apply 1 application topically daily as needed (irritation).     Marland Kitchen MOVANTIK 25 MG TABS tablet   0  . nicotine polacrilex (NICORETTE) 4 MG gum Take 4 mg by mouth as needed for smoking cessation.    Marland Kitchen omeprazole (PRILOSEC) 20 MG capsule Take 20 mg by mouth 2 (two) times daily before a meal.    . oxyCODONE-acetaminophen (PERCOCET) 10-325 MG tablet Take 1 tablet by mouth every 6 (six) hours as needed for pain. 30 tablet 0  .  potassium chloride SA (K-DUR,KLOR-CON) 20 MEQ tablet Take 2 tablets (40 mEq total) by mouth daily. 30 tablet 0   No current facility-administered medications for this visit.     Review of Systems  Constitutional:  Constitutional negative. HENT: HENT negative.  Eyes: Eyes negative.  Respiratory: Respiratory negative.  Cardiovascular: Cardiovascular negative.  GI: Gastrointestinal negative.  Musculoskeletal: Positive for leg pain.  Skin: Skin negative.  Neurological: Positive for numbness.  Hematologic: Hematologic/lymphatic negative.  Psychiatric: Psychiatric negative.  Objective:  Objective   Vitals:   05/22/17 0856  BP: 139/80  Pulse: 70  Resp: 14  Temp: 97.7 F (36.5 C)  TempSrc: Oral  SpO2: 99%  Weight: 129 lb (58.5 kg)  Height: 5\' 1"  (1.549 m)   Body mass index is 24.37 kg/m.  Physical Exam  Constitutional: She appears well-developed.  HENT:  Head: Normocephalic.  Eyes: Pupils are equal, round, and reactive to light.  Cardiovascular: Normal rate.   Pulses:      Dorsalis pedis pulses are 2+ on the right side.  Signal at left dp/pt  Pulmonary/Chest: Effort normal.  Abdominal: Soft. She exhibits no mass.  Musculoskeletal: Normal range of motion. She exhibits no edema.  Well healed right groin and above knee incisions Well healed right dorsal foot wound  Neurological: She is alert.  Skin: Skin is warm and dry.  Psychiatric: She has a normal mood and affect. Her behavior is normal. Judgment and thought content normal.    Data:      Assessment/Plan:     66 year old female with history of right femoral to above-knee popliteal artery bypass grafting with graft. She has an ABI of 1 with a palpable pulse today and is healed her wounds since her surgery in February. She does have some residual swelling which I recommended compression stockings for. She is thinking on smoking. She should continue aspirin and Plavix at this time and follow up in 6 months  with repeat ABIs and duplex.     Waynetta Sandy MD [facility]

## 2017-05-26 NOTE — Addendum Note (Signed)
Addended by: Lianne Cure A on: 05/26/2017 04:19 PM   Modules accepted: Orders

## 2017-05-27 ENCOUNTER — Other Ambulatory Visit: Payer: Self-pay | Admitting: Internal Medicine

## 2017-05-27 DIAGNOSIS — E2839 Other primary ovarian failure: Secondary | ICD-10-CM

## 2017-10-07 ENCOUNTER — Other Ambulatory Visit: Payer: Self-pay | Admitting: Internal Medicine

## 2017-10-07 DIAGNOSIS — Z1231 Encounter for screening mammogram for malignant neoplasm of breast: Secondary | ICD-10-CM

## 2017-11-03 NOTE — Progress Notes (Deleted)
[facility]  PATIENT: Faith [name] DOB: 10-Sep-1951   REASON FOR VISIT: *** HISTORY FROM:    HISTORY OF PRESENT ILLNESS:  05/05/17 PSMs. [name] is a pleasant 66 year old African-American lady is seen today for first office follow-up visit following hospital admission for stroke in March 2018. History is up 10 from the patient, review of Hospital medical records and have personally reviewed imaging films. Faith [name] a 66 y.o.femalewith a history of peripheral artery disease who underwent a femoral bypass last month. She was in her normal state of health on laying down around 11:30 PM on 02/13/17, however on awakening shortly after 3 AM  next day she was severely vertiginous. She had nausea and vomiting. She also has severe left eye pain and blurred vision. CT scan of the head on admission showed a old left thalamus lacunar infarct. 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 There was a incidental multiple enlarged upper mediastinal lymph nodes noted. This was confirmed subsequently with a CT scan of the chest. MRI scan of the brain confirms scattered punctate acute infarcts in the left frontal, parietal cortical as well as subcortical region and left caudate and deep white matter. Transthoracic echo showed normal ejection fraction. LDL cholesterol was 80 mg percent and hemoglobin A1c was 4.8. Patient was started on aspirin and Plavix as well as Lipitor and advised to quit smoking. She states she's done well since discharge her speech seems to have improved she still has some mild right-sided weakness. Is also having right foot swelling. She is seen vascular surgeon Dr. Donzetta Matters who plans  to outpatient LE arterial Doppler on her. She has cut back smoking but still occasionally sneaks in a few cigarettes with friends. She is tolerating aspirin and Plavix without significant bleeding and only mild  bruising. She still drags her right leg while walking but feels confident with a cane. She has had no falls or injuries.   REVIEW OF SYSTEMS: Full 14 system review of systems performed and notable only for those listed, all others are neg:  Constitutional: neg  Cardiovascular: neg Ear/Nose/Throat: neg  Skin: neg Eyes: neg Respiratory: neg Gastroitestinal: neg  Hematology/Lymphatic: neg  Endocrine: neg Musculoskeletal:neg Allergy/Immunology: neg Neurological: neg Psychiatric: neg Sleep : neg   ALLERGIES: Allergies  Allergen Reactions  . Penicillins Itching and Other (See Comments)    REACTION: Yeast infection  Has patient had a PCN  reaction causing immediate rash, facial/tongue/throat swelling, SOB or lightheadedness with hypotension: {no Has patient had a PCN reaction causing severe rash involving mucus membranes or skin necrosis: {no Has patient had a PCN reaction that required hospitalization no Has patient had a PCN reaction occurring within the last 10 years: {no If all of the above answers are "NO", then may proceed with Cephalosporin use.  Marland Kitchen Percocet [Oxycodone-Acetaminophen] Itching    Takes with benadryl for itching     HOME MEDICATIONS: Outpatient Medications Prior to Visit  Medication Sig Dispense Refill  . albuterol (PROVENTIL HFA;VENTOLIN HFA) 108 (90 Base) MCG/ACT inhaler Inhale 1-2 puffs into the lungs every 6 (six) hours as needed for wheezing or shortness of breath.    Marland Kitchen albuterol (PROVENTIL) (2.5 MG/3ML) 0.083% nebulizer solution Take 2.5 mg by nebulization every 6 (six) hours as needed for wheezing or shortness of breath.    Marland Kitchen amLODipine (NORVASC) 5 MG tablet Take 5 mg by mouth daily.    Marland Kitchen aspirin EC 325 MG tablet Take 1  tablet (325 mg total) by mouth daily. 30 tablet 0  . atorvastatin (LIPITOR) 10 MG tablet take 1 tablet by mouth once daily 30 tablet 0  . benzonatate (TESSALON) 100 MG capsule Take 100 mg by mouth daily as needed for cough.    .  clopidogrel (PLAVIX) 75 MG tablet Take 1 tablet (75 mg total) by mouth daily. 30 tablet 0  . diclofenac sodium (VOLTAREN) 1 % GEL Apply 2 g topically daily as needed (pain).     Marland Kitchen diphenhydrAMINE (BENADRYL) 25 MG tablet Take 2 tablets (50 mg total) by mouth at bedtime as needed for sleep. 20 tablet 0  . fluticasone (FLONASE) 50 MCG/ACT nasal spray Place 2 sprays into both nostrils daily.   0  . furosemide (LASIX) 20 MG tablet Take 20 mg by mouth every other day.     . gabapentin (NEURONTIN) 300 MG capsule Take 300 mg by mouth 2 (two) times daily.     . Linaclotide (LINZESS) 290 MCG CAPS capsule Take 290-580 mcg by mouth daily. Takes 1-2 caps daily    . loratadine (CLARITIN) 10 MG tablet Take 10 mg by mouth daily.    . methocarbamol (ROBAXIN) 750 MG tablet Take 750 mg by mouth every 8 (eight) hours as needed for muscle spasms.     . mometasone (ELOCON) 0.1 % ointment Apply 1 application topically daily as needed (irritation).     Marland Kitchen MOVANTIK 25 MG TABS tablet   0  . nicotine polacrilex (NICORETTE) 4 MG gum Take 4 mg by mouth as needed for smoking cessation.    Marland Kitchen omeprazole (PRILOSEC) 20 MG capsule Take 20 mg by mouth 2 (two) times daily before a meal.    . oxyCODONE-acetaminophen (PERCOCET) 10-325 MG tablet Take 1 tablet by mouth every 6 (six) hours as needed for pain. 30 tablet 0  . potassium chloride SA (K-DUR,KLOR-CON) 20 MEQ tablet Take 2 tablets (40 mEq total) by mouth daily. 30 tablet 0   No facility-administered medications prior to visit.     PAST MEDICAL HISTORY: Past Medical History:  Diagnosis Date  . Anxiety   . Arthritis    "thighs; legs; hips" (07/05/2014)  . Bipolar disorder ([place])   . Cholelithiasis 07/2014  . Chronic bronchitis ([place])    "get it q yr"  . Chronic lower back pain   . Depression    pt. use to go to depression clinic, states she still has depression & anxiety but can't get to appt. so she hasn't had any med. for it in a while   . Dyspnea   . GERD  (gastroesophageal reflux disease)   . High cholesterol   . Hypertension   . Peripheral vascular disease ([place])   . Schizophrenia ([place])   . Stroke [facility])     PAST SURGICAL HISTORY: Past Surgical History:  Procedure Laterality Date  . ABDOMINAL AORTOGRAM N/A 01/07/2017   Procedure: Abdominal Aortogram;  Surgeon: Waynetta Sandy, MD;  Location: [facility];  Service: Cardiovascular;  Laterality: N/A;  . ABDOMINAL HYSTERECTOMY    . ANKLE FRACTURE SURGERY Right   . ANKLE HARDWARE REMOVAL Right   . APPENDECTOMY  ~ 1963  . BREAST BIOPSY Bilateral   . BREAST CYST EXCISION Bilateral    "not cancer"  . CATARACT EXTRACTION W/ INTRAOCULAR LENS  IMPLANT, BILATERAL    . CHOLECYSTECTOMY N/A 07/05/2014   Procedure: LAPAROSCOPIC CHOLECYSTECTOMY WITH INTRAOPERATIVE CHOLANGIOGRAM;  Surgeon: Gayland Curry, MD;  Location: [facility];  Service: General;  Laterality:  N/A;  . EXCISIONAL HEMORRHOIDECTOMY    . FEMORAL-POPLITEAL BYPASS GRAFT Right 01/15/2017   Procedure: BYPASS GRAFT FEMORAL-POPLITEAL ARTERY RIGHT LEG;  Surgeon: Waynetta Sandy, MD;  Location: [place];  Service: Vascular;  Laterality: Right;  . LAPAROSCOPIC CHOLECYSTECTOMY  07/05/2014  . LOWER EXTREMITY ANGIOGRAPHY Bilateral 01/07/2017   Procedure: Lower Extremity Angiography;  Surgeon: Waynetta Sandy, MD;  Location: [facility];  Service: Cardiovascular;  Laterality: Bilateral;  . MULTIPLE TOOTH EXTRACTIONS  05/2013   "pulled my upper teeth"  . PILONIDAL CYST EXCISION      FAMILY HISTORY: Family History  Problem Relation Age of Onset  . Cancer Mother 27       colon  . Diabetes Mother   . Hypertension Mother   . Cancer Father   . Diabetes Father   . Hypertension Father     SOCIAL HISTORY: Social History   Socioeconomic History  . Marital status: Single    Spouse name: Not on file  . Number of children: Not on file  . Years of education: Not on file  . Highest education level: Not on file  Social  Needs  . Financial resource strain: Not on file  . Food insecurity - worry: Not on file  . Food insecurity - inability: Not on file  . Transportation needs - medical: Not on file  . Transportation needs - non-medical: Not on file  Occupational History  . Not on file  Tobacco Use  . Smoking status: Former Smoker    Packs/day: 1.50    Years: 48.00    Pack years: 72.00    Types: Cigarettes    Last attempt to quit: 12/31/2016    Years since quitting: 0.8  . Smokeless tobacco: Never Used  Substance and Sexual Activity  . Alcohol use: Yes    Alcohol/week: 1.2 oz    Types: 2 Cans of beer per week    Comment:  only on the weekend  . Drug use: No    Comment: 15-20 yrs. ago- used marijuana   . Sexual activity: Yes    Birth control/protection: Post-menopausal  Other Topics Concern  . Not on file  Social History Narrative  . Not on file     PHYSICAL EXAM  There were no vitals filed for this visit. There is no height or weight on file to calculate BMI.  Generalized: Well developed, in no acute distress  Head: normocephalic and atraumatic,. Oropharynx benign  Neck: Supple, no carotid bruits  Cardiac: Regular rate rhythm, no murmur  Musculoskeletal: No deformity   Neurological examination   Mentation: Alert oriented to time, place, history taking. Attention span and concentration appropriate. Recent and remote memory intact.  Follows all commands speech and language fluent.   Cranial nerve II-XII: Fundoscopic exam reveals sharp disc margins.Pupils were equal round reactive to light extraocular movements were full, visual field were full on confrontational test. Facial sensation and strength were normal. hearing was intact to finger rubbing bilaterally. Uvula tongue midline. head turning and shoulder shrug were normal and symmetric.Tongue protrusion into cheek strength was normal. Motor: normal bulk and tone, full strength in the BUE, BLE, fine finger movements normal, no pronator  drift. No focal weakness Sensory: normal and symmetric to light touch, pinprick, and  Vibration, proprioception  Coordination: finger-nose-finger, heel-to-shin bilaterally, no dysmetria Reflexes: Brachioradialis 2/2, biceps 2/2, triceps 2/2, patellar 2/2, Achilles 2/2, plantar responses were flexor bilaterally. Gait and Station: Rising up from seated position without assistance, normal stance,  moderate  stride, good arm swing, smooth turning, able to perform tiptoe, and heel walking without difficulty. Tandem gait is steady  DIAGNOSTIC DATA (LABS, IMAGING, TESTING) - I reviewed patient records, labs, notes, testing and imaging myself where available.  Lab Results  Component Value Date   WBC 8.7 02/17/2017   HGB 11.4 (L) 02/17/2017   HCT 34.0 (L) 02/17/2017   MCV 83.5 02/17/2017   PLT 192 02/17/2017      Component Value Date/Time   NA 141 02/18/2017 0729   K 3.5 02/18/2017 0729   CL 113 (H) 02/18/2017 0729   CO2 21 (L) 02/18/2017 0729   GLUCOSE 94 02/18/2017 0729   BUN 6 02/18/2017 0729   CREATININE 0.56 02/18/2017 0729   CALCIUM 9.8 02/18/2017 0729   PROT 7.0 02/14/2017 0420   ALBUMIN 3.6 02/14/2017 0420   AST 16 02/14/2017 0420   ALT 11 (L) 02/14/2017 0420   ALKPHOS 116 02/14/2017 0420   BILITOT 0.4 02/14/2017 0420   GFRNONAA >60 02/18/2017 0729   GFRAA >60 02/18/2017 0729   Lab Results  Component Value Date   CHOL 139 02/15/2017   HDL 34 (L) 02/15/2017   LDLCALC 80 02/15/2017   TRIG 124 02/15/2017   CHOLHDL 4.1 02/15/2017   Lab Results  Component Value Date   HGBA1C 4.8 02/15/2017    ASSESSMENT AND PLAN  27 year African-American lady with left hemispheric watershed infarcts in March 2018 secondary to chronic left ICA occlusion. Multiple vascular risk factors of peripheral vascular disease, smoking, hypertension and hyperlipidemia    PLAN: I had a long d/w patient about her recent watershed strokes from left carotid occlusion, risk for recurrent  stroke/TIAs, personally independently reviewed imaging studies and stroke evaluation results and answered questions.Continue aspirin 325 mg daily and clopidogrel 75 mg daily  for secondary stroke prevention  Given significant peripheral vascular disease as well and maintain strict control of hypertension with blood pressure goal below 130/90, diabetes with hemoglobin A1c goal below 6.5% and lipids with LDL cholesterol goal below 70 mg/dL. I also advised the patient to eat a healthy diet with plenty of whole grains, cereals, fruits and vegetables, exercise regularly and maintain ideal body weight. I counseled her to quit smoking completely. She may also consider possible participation in the [place], [facility], [facility], [facility] [address] [place], [place] [address] [phone]

## 2017-11-04 ENCOUNTER — Ambulatory Visit: Payer: Medicaid Other | Admitting: Nurse Practitioner

## 2017-11-05 ENCOUNTER — Ambulatory Visit: Payer: Self-pay

## 2017-11-05 ENCOUNTER — Encounter: Payer: Self-pay | Admitting: Nurse Practitioner

## 2017-11-26 ENCOUNTER — Emergency Department (HOSPITAL_COMMUNITY): Payer: Medicare Other

## 2017-11-26 ENCOUNTER — Encounter (HOSPITAL_COMMUNITY): Payer: Self-pay | Admitting: Emergency Medicine

## 2017-11-26 ENCOUNTER — Emergency Department (HOSPITAL_COMMUNITY)
Admission: EM | Admit: 2017-11-26 | Discharge: 2017-11-26 | Disposition: A | Discharge status: Home / Self Care | Payer: Medicare Other | Attending: Emergency Medicine | Admitting: Emergency Medicine

## 2017-11-26 ENCOUNTER — Other Ambulatory Visit: Payer: Self-pay

## 2017-11-26 DIAGNOSIS — M545 Low back pain: Secondary | ICD-10-CM | POA: Diagnosis not present

## 2017-11-26 DIAGNOSIS — R221 Localized swelling, mass and lump, neck: Secondary | ICD-10-CM

## 2017-11-26 DIAGNOSIS — Z79899 Other long term (current) drug therapy: Secondary | ICD-10-CM | POA: Insufficient documentation

## 2017-11-26 DIAGNOSIS — Z7982 Long term (current) use of aspirin: Secondary | ICD-10-CM | POA: Insufficient documentation

## 2017-11-26 DIAGNOSIS — Z87891 Personal history of nicotine dependence: Secondary | ICD-10-CM | POA: Diagnosis not present

## 2017-11-26 DIAGNOSIS — G8929 Other chronic pain: Secondary | ICD-10-CM | POA: Diagnosis not present

## 2017-11-26 DIAGNOSIS — M25561 Pain in right knee: Secondary | ICD-10-CM | POA: Insufficient documentation

## 2017-11-26 DIAGNOSIS — Z8673 Personal history of transient ischemic attack (TIA), and cerebral infarction without residual deficits: Secondary | ICD-10-CM | POA: Insufficient documentation

## 2017-11-26 DIAGNOSIS — R2241 Localized swelling, mass and lump, right lower limb: Secondary | ICD-10-CM | POA: Insufficient documentation

## 2017-11-26 DIAGNOSIS — Z7902 Long term (current) use of antithrombotics/antiplatelets: Secondary | ICD-10-CM | POA: Diagnosis not present

## 2017-11-26 DIAGNOSIS — I10 Essential (primary) hypertension: Secondary | ICD-10-CM | POA: Insufficient documentation

## 2017-11-26 MED ORDER — OXYCODONE-ACETAMINOPHEN 5-325 MG PO TABS
2.0000 | ORAL_TABLET | Freq: Once | ORAL | Status: AC
  Administered 2017-11-26: 2 via ORAL
  Filled 2017-11-26: qty 2
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
## 2017-11-26 NOTE — ED Notes (Signed)
Patient transported to X-ray 
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
## 2017-11-27 ENCOUNTER — Ambulatory Visit: Payer: Medicaid Other | Admitting: Family

## 2017-11-27 ENCOUNTER — Encounter (HOSPITAL_COMMUNITY): Payer: Medicaid Other

## 2017-12-01 DIAGNOSIS — R221 Localized swelling, mass and lump, neck: Secondary | ICD-10-CM

## 2017-12-01 HISTORY — DX: Localized swelling, mass and lump, neck: R22.1

## 2017-12-03 ENCOUNTER — Ambulatory Visit: Payer: Self-pay

## 2017-12-11 ENCOUNTER — Ambulatory Visit: Payer: Self-pay | Admitting: Family

## 2017-12-16 ENCOUNTER — Ambulatory Visit: Payer: Medicaid Other

## 2017-12-16 ENCOUNTER — Inpatient Hospital Stay
Admission: RE | Admit: 2017-12-16 | Discharge: 2017-12-16 | Disposition: A | Discharge status: Home / Self Care | Payer: Medicaid Other | Source: Ambulatory Visit | Attending: Internal Medicine | Admitting: Internal Medicine

## 2017-12-21 NOTE — Pre-Procedure Instructions (Addendum)
Taleigh Martinique  12/21/2017      [id number] [place] [facility] [address] [place] [address] Phone: [phone] Fax: [phone]  [facility] [address] [place], [place] [address] [place] [address] Phone: [phone] Fax: [phone]  [place], [place] [address] [place] [address] Phone: [phone] Fax: [phone]    Your procedure is scheduled on 12/25/2017.  Report to [facility] Admitting at [phone] A.M.  Call this number if you have problems the morning of surgery:  [phone]   Remember:   Continue all medications as directed by your physician except follow these medication instructions before surgery below   Do not eat food or drink liquids after midnight.   Take these medicines the morning of surgery with A SIP OF WATER: Albuterol inhaler - if needed Albuterol nebulizer - if needed Amlodipine (Norvasc) Benzonatate (tessalon) - if needed for cough Fluticasone (Flonase)  Gabapentin (Neurontin) Loratadine (Claritin) Methocarbamol (Robaxin) - if needed for muscle spasms Omeprazole (Prilosec) Oxycodone-acetaminophen (Percocet) - if needed for pain  7 days prior to surgery STOP taking any Diclofenac Sodium Gel, Aspirin (unless otherwise instructed by your surgeon), Aleve, Naproxen, Ibuprofen, Motrin, Advil, Goody's, BC's, all herbal medications, fish oil, and all vitamins (DO NOT stop your potassium supplement)  Follow your doctors instructions regarding your Plavix and Aspirin.  If no instructions were given by your doctor, then you will need to call the prescribing office office to get instructions.       Do not wear jewelry, make-up or nail polish.  Do not wear lotions, powders, or perfumes, or deodorant.  Do not shave 48 hours prior to surgery.    Do not bring  valuables to the hospital.  [facility] is not responsible for any belongings or valuables.  Hearing aids, eyeglasses, contacts, dentures or bridgework may not be worn into surgery.  Leave your suitcase in the car.  After surgery it may be brought to your room.  For patients admitted to the hospital, discharge time will be determined by your treatment team.  Patients discharged the day of surgery will not be allowed to drive home.   Name and phone number of your driver:    Special instructions:   [facility]- Preparing For Surgery  Before surgery, you can play an important role. Because skin is not sterile, your skin needs to be as free of germs as possible. You can reduce the number of germs on your skin by washing with CHG (chlorahexidine gluconate) Soap before surgery.  CHG is an antiseptic cleaner which kills germs and bonds with the skin to continue killing germs even after washing.  Please do not use if you have an allergy to CHG or antibacterial soaps. If your skin becomes reddened/irritated stop using the CHG.  Do not shave (including legs and underarms) for at least 48 hours prior to first CHG shower. It is OK to shave your face.  Please follow these instructions carefully.   1. Shower the NIGHT BEFORE SURGERY and the MORNING OF SURGERY with CHG.   2. If you chose to wash your hair, wash your hair first as usual with your normal shampoo.  3. After you shampoo, rinse your hair and body thoroughly to remove the shampoo.  4. Use CHG as you would any other liquid soap. You can apply CHG directly to  the skin and wash gently with a scrungie or a clean washcloth.   5. Apply the CHG Soap to your body ONLY FROM THE NECK DOWN.  Do not use on open wounds or open sores. Avoid contact with your eyes, ears, mouth and genitals (private parts). Wash Face and genitals (private parts)  with your normal soap.  6. Wash thoroughly, paying special attention to the area where your surgery will be  performed.  7. Thoroughly rinse your body with warm water from the neck down.  8. DO NOT shower/wash with your normal soap after using and rinsing off the CHG Soap.  9. Pat yourself dry with a CLEAN TOWEL.  10. Wear CLEAN PAJAMAS to bed the night before surgery, wear comfortable clothes the morning of surgery  11. Place CLEAN SHEETS on your bed the night of your first shower and DO NOT SLEEP WITH PETS.    Day of Surgery: Shower as stated above. Do not apply any deodorants/lotions. Please wear clean clothes to the hospital/surgery center.      Please read over the following fact sheets that you were given.

## 2017-12-22 ENCOUNTER — Other Ambulatory Visit: Payer: Self-pay

## 2017-12-22 ENCOUNTER — Encounter (HOSPITAL_COMMUNITY)
Admission: RE | Admit: 2017-12-22 | Discharge: 2017-12-22 | Disposition: A | Discharge status: Home / Self Care | Payer: Medicare Other | Source: Ambulatory Visit | Attending: Otolaryngology | Admitting: Otolaryngology

## 2017-12-22 ENCOUNTER — Inpatient Hospital Stay (HOSPITAL_COMMUNITY)
Admission: EM | Admit: 2017-12-22 | Discharge: 2017-12-25 | DRG: 641 | Disposition: A | Discharge status: Home Health | Payer: Medicare Other | Attending: Nephrology | Admitting: Nephrology

## 2017-12-22 ENCOUNTER — Encounter (HOSPITAL_COMMUNITY): Payer: Self-pay

## 2017-12-22 DIAGNOSIS — I10 Essential (primary) hypertension: Secondary | ICD-10-CM | POA: Diagnosis not present

## 2017-12-22 DIAGNOSIS — R112 Nausea with vomiting, unspecified: Secondary | ICD-10-CM | POA: Diagnosis present

## 2017-12-22 DIAGNOSIS — E875 Hyperkalemia: Secondary | ICD-10-CM | POA: Diagnosis present

## 2017-12-22 DIAGNOSIS — Z7982 Long term (current) use of aspirin: Secondary | ICD-10-CM

## 2017-12-22 DIAGNOSIS — E785 Hyperlipidemia, unspecified: Secondary | ICD-10-CM | POA: Diagnosis present

## 2017-12-22 DIAGNOSIS — E872 Acidosis, unspecified: Secondary | ICD-10-CM

## 2017-12-22 DIAGNOSIS — R197 Diarrhea, unspecified: Secondary | ICD-10-CM

## 2017-12-22 DIAGNOSIS — Z79899 Other long term (current) drug therapy: Secondary | ICD-10-CM

## 2017-12-22 DIAGNOSIS — I639 Cerebral infarction, unspecified: Secondary | ICD-10-CM

## 2017-12-22 DIAGNOSIS — I739 Peripheral vascular disease, unspecified: Secondary | ICD-10-CM | POA: Diagnosis present

## 2017-12-22 DIAGNOSIS — Z961 Presence of intraocular lens: Secondary | ICD-10-CM | POA: Diagnosis present

## 2017-12-22 DIAGNOSIS — Z9842 Cataract extraction status, left eye: Secondary | ICD-10-CM

## 2017-12-22 DIAGNOSIS — K219 Gastro-esophageal reflux disease without esophagitis: Secondary | ICD-10-CM

## 2017-12-22 DIAGNOSIS — Z720 Tobacco use: Secondary | ICD-10-CM

## 2017-12-22 DIAGNOSIS — N289 Disorder of kidney and ureter, unspecified: Secondary | ICD-10-CM

## 2017-12-22 DIAGNOSIS — Z7902 Long term (current) use of antithrombotics/antiplatelets: Secondary | ICD-10-CM

## 2017-12-22 DIAGNOSIS — R221 Localized swelling, mass and lump, neck: Secondary | ICD-10-CM

## 2017-12-22 DIAGNOSIS — F419 Anxiety disorder, unspecified: Secondary | ICD-10-CM | POA: Diagnosis present

## 2017-12-22 DIAGNOSIS — Z9841 Cataract extraction status, right eye: Secondary | ICD-10-CM

## 2017-12-22 DIAGNOSIS — E876 Hypokalemia: Secondary | ICD-10-CM | POA: Diagnosis present

## 2017-12-22 DIAGNOSIS — G8929 Other chronic pain: Secondary | ICD-10-CM | POA: Diagnosis present

## 2017-12-22 DIAGNOSIS — F319 Bipolar disorder, unspecified: Secondary | ICD-10-CM | POA: Diagnosis present

## 2017-12-22 DIAGNOSIS — N179 Acute kidney failure, unspecified: Secondary | ICD-10-CM | POA: Diagnosis not present

## 2017-12-22 DIAGNOSIS — F17210 Nicotine dependence, cigarettes, uncomplicated: Secondary | ICD-10-CM | POA: Diagnosis present

## 2017-12-22 DIAGNOSIS — F209 Schizophrenia, unspecified: Secondary | ICD-10-CM | POA: Diagnosis present

## 2017-12-22 DIAGNOSIS — Z9049 Acquired absence of other specified parts of digestive tract: Secondary | ICD-10-CM

## 2017-12-22 DIAGNOSIS — E870 Hyperosmolality and hypernatremia: Secondary | ICD-10-CM | POA: Diagnosis present

## 2017-12-22 DIAGNOSIS — Z8673 Personal history of transient ischemic attack (TIA), and cerebral infarction without residual deficits: Secondary | ICD-10-CM | POA: Diagnosis present

## 2017-12-22 DIAGNOSIS — E7800 Pure hypercholesterolemia, unspecified: Secondary | ICD-10-CM | POA: Diagnosis present

## 2017-12-22 DIAGNOSIS — C779 Secondary and unspecified malignant neoplasm of lymph node, unspecified: Secondary | ICD-10-CM | POA: Diagnosis present

## 2017-12-22 DIAGNOSIS — E860 Dehydration: Secondary | ICD-10-CM | POA: Diagnosis present

## 2017-12-22 HISTORY — DX: Localized swelling, mass and lump, neck: R22.1

## 2017-12-22 LAB — BASIC METABOLIC PANEL
ANION GAP: 10 (ref 5–15)
Anion gap: 10 (ref 5–15)
BUN: 20 mg/dL (ref 6–20)
BUN: 21 mg/dL — ABNORMAL HIGH (ref 6–20)
CALCIUM: 11.9 mg/dL — AB (ref 8.9–10.3)
CO2: 17 mmol/L — ABNORMAL LOW (ref 22–32)
CO2: 16 mmol/L — AB (ref 22–32)
CREATININE: 1.19 mg/dL — AB (ref 0.44–1.00)
CREATININE: 1.29 mg/dL — AB (ref 0.44–1.00)
Calcium: 12 mg/dL — ABNORMAL HIGH (ref 8.9–10.3)
Chloride: 113 mmol/L — ABNORMAL HIGH (ref 101–111)
Chloride: 112 mmol/L — ABNORMAL HIGH (ref 101–111)
GFR calc Af Amer: 54 mL/min — ABNORMAL LOW (ref >60)
GFR, EST AFRICAN AMERICAN: 49 mL/min — AB (ref >60)
GFR, EST NON AFRICAN AMERICAN: 47 mL/min — AB (ref >60)
GFR, EST NON AFRICAN AMERICAN: 42 mL/min — AB (ref >60)
GLUCOSE: 97 mg/dL (ref 65–99)
GLUCOSE: 105 mg/dL — AB (ref 65–99)
POTASSIUM: 2 mmol/L — AB (ref 3.5–5.1)
Potassium: 2.4 mmol/L — CL (ref 3.5–5.1)
SODIUM: 140 mmol/L (ref 135–145)
Sodium: 138 mmol/L (ref 135–145)

## 2017-12-22 LAB — CBC
HEMATOCRIT: 36.2 % (ref 36.0–46.0)
Hemoglobin: 12.8 g/dL (ref 12.0–15.0)
MCH: 29.3 pg (ref 26.0–34.0)
MCHC: 35.4 g/dL (ref 30.0–36.0)
MCV: 82.8 fL (ref 78.0–100.0)
Platelets: 203 10*3/uL (ref 150–400)
RBC: 4.37 MIL/uL (ref 3.87–5.11)
RDW: 14.1 % (ref 11.5–15.5)
WBC: 9.7 10*3/uL (ref 4.0–10.5)

## 2017-12-22 LAB — MAGNESIUM: Magnesium: 2.2 mg/dL (ref 1.7–2.4)

## 2017-12-22 MED ORDER — METHOCARBAMOL 500 MG PO TABS
750.0000 mg | ORAL_TABLET | Freq: Two times a day (BID) | ORAL | Status: DC | PRN
  Administered 2017-12-23: 02:00:00 via ORAL
  Filled 2017-12-22 (×2): qty 2

## 2017-12-22 MED ORDER — ATORVASTATIN CALCIUM 10 MG PO TABS
10.0000 mg | ORAL_TABLET | Freq: Every day | ORAL | Status: DC
  Administered 2017-12-23 – 2017-12-25 (×3): 10 mg via ORAL
  Filled 2017-12-22 (×4): qty 1

## 2017-12-22 MED ORDER — ENOXAPARIN SODIUM 40 MG/0.4ML ~~LOC~~ SOLN
40.0000 mg | Freq: Every day | SUBCUTANEOUS | Status: DC
  Administered 2017-12-23 – 2017-12-24 (×2): 40 mg via SUBCUTANEOUS
  Filled 2017-12-22 (×3): qty 0.4

## 2017-12-22 MED ORDER — LINACLOTIDE 145 MCG PO CAPS
290.0000 ug | ORAL_CAPSULE | Freq: Every day | ORAL | Status: DC
  Administered 2017-12-24: 290 ug via ORAL
  Filled 2017-12-22 (×3): qty 2
  Filled 2017-12-22: qty 1

## 2017-12-22 MED ORDER — ASPIRIN EC 325 MG PO TBEC
325.0000 mg | DELAYED_RELEASE_TABLET | Freq: Every day | ORAL | Status: DC
  Administered 2017-12-23 – 2017-12-25 (×3): 325 mg via ORAL
  Filled 2017-12-22 (×3): qty 1

## 2017-12-22 MED ORDER — CALCITONIN (SALMON) 200 UNIT/ML IJ SOLN: 4.0000 [IU]/kg | Freq: Once | INTRAMUSCULAR | Status: DC

## 2017-12-22 MED ORDER — POTASSIUM CHLORIDE 10 MEQ/100ML IV SOLN
10.0000 meq | INTRAVENOUS | Status: AC
  Administered 2017-12-22 – 2017-12-23 (×3): 10 meq via INTRAVENOUS
  Filled 2017-12-22 (×3): qty 100

## 2017-12-22 MED ORDER — LUBIPROSTONE 24 MCG PO CAPS
24.0000 ug | ORAL_CAPSULE | Freq: Two times a day (BID) | ORAL | Status: DC
  Administered 2017-12-23 – 2017-12-24 (×4): 24 ug via ORAL
  Filled 2017-12-22 (×5): qty 1

## 2017-12-22 MED ORDER — ALBUTEROL SULFATE (2.5 MG/3ML) 0.083% IN NEBU: 2.5000 mg | INHALATION_SOLUTION | RESPIRATORY_TRACT | Status: DC | PRN

## 2017-12-22 MED ORDER — POTASSIUM CHLORIDE CRYS ER 20 MEQ PO TBCR
40.0000 meq | EXTENDED_RELEASE_TABLET | Freq: Once | ORAL | Status: AC
  Administered 2017-12-22: 40 meq via ORAL
  Filled 2017-12-22: qty 2

## 2017-12-22 MED ORDER — POTASSIUM CHLORIDE CRYS ER 20 MEQ PO TBCR
40.0000 meq | EXTENDED_RELEASE_TABLET | Freq: Once | ORAL | Status: AC
  Administered 2017-12-23: 40 meq via ORAL
  Filled 2017-12-22: qty 2

## 2017-12-22 MED ORDER — ONDANSETRON HCL 4 MG/2ML IJ SOLN
4.0000 mg | Freq: Four times a day (QID) | INTRAMUSCULAR | Status: DC | PRN
Start: 2017-12-22 — End: 2017-12-25

## 2017-12-22 MED ORDER — ACETAMINOPHEN 650 MG RE SUPP: 650.0000 mg | Freq: Four times a day (QID) | RECTAL | Status: DC | PRN

## 2017-12-22 MED ORDER — SODIUM CHLORIDE 0.9 % IV BOLUS (SEPSIS)
1000.0000 mL | Freq: Once | INTRAVENOUS | Status: AC
  Administered 2017-12-23: 1000 mL via INTRAVENOUS

## 2017-12-22 MED ORDER — PANTOPRAZOLE SODIUM 40 MG PO TBEC
40.0000 mg | DELAYED_RELEASE_TABLET | Freq: Every day | ORAL | Status: DC
  Administered 2017-12-23 – 2017-12-25 (×3): 40 mg via ORAL
  Filled 2017-12-22 (×3): qty 1

## 2017-12-22 MED ORDER — LORATADINE 10 MG PO TABS
10.0000 mg | ORAL_TABLET | Freq: Every day | ORAL | Status: DC
  Administered 2017-12-23 – 2017-12-25 (×3): 10 mg via ORAL
  Filled 2017-12-22 (×3): qty 1

## 2017-12-22 MED ORDER — ZOLPIDEM TARTRATE 5 MG PO TABS: 5.0000 mg | ORAL_TABLET | Freq: Every evening | ORAL | Status: DC | PRN

## 2017-12-22 MED ORDER — NICOTINE 21 MG/24HR TD PT24
21.0000 mg | MEDICATED_PATCH | Freq: Every day | TRANSDERMAL | Status: DC
  Administered 2017-12-23 – 2017-12-25 (×4): 21 mg via TRANSDERMAL
  Filled 2017-12-22 (×4): qty 1

## 2017-12-22 MED ORDER — CLOPIDOGREL BISULFATE 75 MG PO TABS
75.0000 mg | ORAL_TABLET | Freq: Every day | ORAL | Status: DC
  Administered 2017-12-23 – 2017-12-25 (×3): 75 mg via ORAL
  Filled 2017-12-22 (×3): qty 1

## 2017-12-22 MED ORDER — ACETAMINOPHEN 325 MG PO TABS: 650.0000 mg | ORAL_TABLET | Freq: Four times a day (QID) | ORAL | Status: DC | PRN

## 2017-12-22 MED ORDER — ONDANSETRON HCL 4 MG PO TABS
4.0000 mg | ORAL_TABLET | Freq: Four times a day (QID) | ORAL | Status: DC | PRN
  Administered 2017-12-23 (×2): 4 mg via ORAL
  Filled 2017-12-22 (×2): qty 1

## 2017-12-22 MED ORDER — DICLOFENAC SODIUM 1 % TD GEL
2.0000 g | Freq: Every day | TRANSDERMAL | Status: DC | PRN
Start: 2017-12-22 — End: 2017-12-25

## 2017-12-22 MED ORDER — TRIAMCINOLONE ACETONIDE 0.1 % EX OINT: 1.0000 "application " | TOPICAL_OINTMENT | Freq: Every day | CUTANEOUS | Status: DC | PRN

## 2017-12-22 MED ORDER — DIPHENHYDRAMINE HCL 25 MG PO CAPS
50.0000 mg | ORAL_CAPSULE | Freq: Every evening | ORAL | Status: DC | PRN
Start: 2017-12-22 — End: 2017-12-24
  Administered 2017-12-23 (×2): 50 mg via ORAL
  Filled 2017-12-22 (×2): qty 2

## 2017-12-22 MED ORDER — FLUTICASONE PROPIONATE 50 MCG/ACT NA SUSP
2.0000 | Freq: Every day | NASAL | Status: DC
Start: 2017-12-23 — End: 2017-12-25
  Administered 2017-12-23 – 2017-12-24 (×2): 2 via NASAL
  Filled 2017-12-22: qty 16

## 2017-12-22 MED ORDER — GABAPENTIN 300 MG PO CAPS
300.0000 mg | ORAL_CAPSULE | Freq: Three times a day (TID) | ORAL | Status: DC
  Administered 2017-12-23 – 2017-12-25 (×7): 300 mg via ORAL
  Filled 2017-12-22 (×7): qty 1

## 2017-12-22 MED ORDER — OXYCODONE-ACETAMINOPHEN 10-325 MG PO TABS: 1.0000 | ORAL_TABLET | Freq: Four times a day (QID) | ORAL | Status: DC | PRN

## 2017-12-22 MED ORDER — SODIUM CHLORIDE 0.9 % IV SOLN
INTRAVENOUS | Status: DC
  Administered 2017-12-22: via INTRAVENOUS

## 2017-12-22 MED ORDER — HYDRALAZINE HCL 20 MG/ML IJ SOLN: 5.0000 mg | INTRAMUSCULAR | Status: DC | PRN

## 2017-12-22 NOTE — ED Triage Notes (Signed)
Pt presents with report of potassium of 2, drawn today for pre-op for neck/chest mass biopsy.  Pt reports weakness and dizziness; was just told to d/c blood pressure medication due to hypotension.  Pt also reports no bowel movement x 1 week.

## 2017-12-22 NOTE — H&P (Addendum)
History and Physical    Faith [name] WUJ:[id number] DOB: 01/31/1951 DOA: 12/22/2017  Referring MD/NP/PA:   PCP: Faith Ebbs, MD   Patient coming from:  The patient is coming from home.  At baseline, pt is independent for most of ADL.  Chief Complaint: Abnormal lab (low potassium)  HPI: Faith [name] is a 67 y.o. female with medical history significant of hypertension, hyperlipidemia, stroke, GERD, depression, anxiety, schizophrenia, bipolar disorder, PVD, chronic back pain, tobacco abuse, neck mass, who presents with Abnormal lab (low potassium).   Per patient's daughter, patient has a right neck mass, which has been present for several months. Pt is being worked up in [facility] in [facility]. Patient recently had fine-needle aspiration biopsy by ENT, Dr. Blenda Nicely on 11/30/17. The result indicated possible malignancy, but not sure about the origin of malignancy. They are planning to do surgical biopsy. Pt went to her doctor's office for presurgical lab today. She was found to have hypokalemia with potassium 2.0, and was sent to ED for further eval and treatment.  Pt report pain around her neck and left shoulder areas. Denies chest pain, shortness of breath, cough, fever or chills. Per her daughter, patient has a chronic mild intermittent nausea, vomiting and a loose stool, which has not changed recently. Patient states that she does not have abdominal pain. She did not have diarrhea, nausea or vomiting today. Patient denies symptoms of UTI or unilateral weakness. Per her daughter, patient memory has been declining recently.  ED Course: pt was found to have potassium 2.4, calcium 11.9, acute renal injury with creatinine 1.29, BUN 21, pending urinalysis, temperature normal, bradycardia, oxygen saturation 100% on room air. Patient is placed on telemetry bed for observation.  Review of Systems:   General: no fevers, chills, no body weight gain, has poor appetite, has fatigue HEENT:  no blurry vision, hearing changes or sore throat. Has right neck mass and neck pain. Respiratory: no dyspnea, coughing, wheezing CV: no chest pain, no palpitations GI: has nausea, vomiting, abdominal pain, diarrhea, no constipation GU: no dysuria, burning on urination, increased urinary frequency, hematuria  Ext: no leg edema Neuro: no unilateral weakness, numbness, or tingling, no vision change or hearing loss Skin: no rash, no skin tear. MSK: No muscle spasm, no deformity, no limitation of range of movement in spin Heme: No easy bruising.  Travel history: No recent long distant travel.  Allergy:  Allergies  Allergen Reactions  . Codeine Itching and Other (See Comments)    And yeast infections  . Penicillins Itching and Other (See Comments)    Causes yeast infections Has patient had a PCN  reaction causing immediate rash, facial/tongue/throat swelling, SOB or lightheadedness with hypotension: Yes Has patient had a PCN reaction causing severe rash involving mucus membranes or skin necrosis: Unk Has patient had a PCN reaction that required hospitalization: No Has patient had a PCN reaction occurring within the last 10 years: Yes If all of the above answers are "NO", then may proceed with Cephalosporin use.  Marland Kitchen Percocet [Oxycodone-Acetaminophen] Itching    Tolerates with Benadryl    Past Medical History:  Diagnosis Date  . Anxiety   . Arthritis    "thighs; legs; hips" (07/05/2014)  . Bipolar disorder ([place])   . Cholelithiasis 07/2014  . Chronic bronchitis ([place])    "get it q yr"  . Chronic lower back pain   . Depression    pt. use to go to depression clinic, states she still has depression &  anxiety but can't get to appt. so she hasn't had any med. for it in a while   . Dyspnea   . GERD (gastroesophageal reflux disease)   . High cholesterol   . Hypertension   . Peripheral vascular disease ([place])   . Schizophrenia ([place])   . Stroke [facility])     Past Surgical History:  Procedure  Laterality Date  . ABDOMINAL AORTOGRAM N/A 01/07/2017   Procedure: Abdominal Aortogram;  Surgeon: Waynetta Sandy, MD;  Location: [place] [facility];  Service: Cardiovascular;  Laterality: N/A;  . ABDOMINAL HYSTERECTOMY    . ANKLE FRACTURE SURGERY Right   . ANKLE HARDWARE REMOVAL Right   . APPENDECTOMY  ~ 1963  . BREAST BIOPSY Bilateral   . BREAST CYST EXCISION Bilateral    "not cancer"  . CATARACT EXTRACTION W/ INTRAOCULAR LENS  IMPLANT, BILATERAL    . CHOLECYSTECTOMY N/A 07/05/2014   Procedure: LAPAROSCOPIC CHOLECYSTECTOMY WITH INTRAOPERATIVE CHOLANGIOGRAM;  Surgeon: Gayland Curry, MD;  Location: [place];  Service: General;  Laterality: N/A;  . EXCISIONAL HEMORRHOIDECTOMY    . FEMORAL-POPLITEAL BYPASS GRAFT Right 01/15/2017   Procedure: BYPASS GRAFT FEMORAL-POPLITEAL ARTERY RIGHT LEG;  Surgeon: Waynetta Sandy, MD;  Location: [facility];  Service: Vascular;  Laterality: Right;  . LAPAROSCOPIC CHOLECYSTECTOMY  07/05/2014  . LOWER EXTREMITY ANGIOGRAPHY Bilateral 01/07/2017   Procedure: Lower Extremity Angiography;  Surgeon: Waynetta Sandy, MD;  Location: [place] [facility];  Service: Cardiovascular;  Laterality: Bilateral;  . MULTIPLE TOOTH EXTRACTIONS  05/2013   "pulled my upper teeth"  . PILONIDAL CYST EXCISION      Social History:  reports that she has been smoking cigarettes.  She has a 48.00 pack-year smoking history. she has never used smokeless tobacco. She reports that she does not drink alcohol or use drugs.  Family History:  Family History  Problem Relation Age of Onset  . Cancer Mother 67       colon  . Diabetes Mother   . Hypertension Mother   . Cancer Father   . Diabetes Father   . Hypertension Father      Prior to Admission medications   Medication Sig Start Date End Date Taking? Authorizing Provider  furosemide (LASIX) 20 MG tablet Take 20 mg by mouth daily as needed for fluid or edema.    Yes [provider]  albuterol (PROVENTIL  HFA;VENTOLIN HFA) 108 (90 Base) MCG/ACT inhaler Inhale 1-2 puffs into the lungs every 6 (six) hours as needed for wheezing or shortness of breath.    [provider]  albuterol (PROVENTIL) (2.5 MG/3ML) 0.083% nebulizer solution Take 2.5 mg by nebulization every 6 (six) hours as needed for wheezing or shortness of breath.    [provider]  AMITIZA 24 MCG capsule Take 24 mcg by mouth 2 (two) times daily. 12/07/17   [provider]  aspirin EC 325 MG tablet Take 1 tablet (325 mg total) by mouth daily. 02/18/17   Hongalgi, Lenis Dickinson, MD  atorvastatin (LIPITOR) 10 MG tablet take 1 tablet by mouth once daily 04/05/17   Waynetta Sandy, MD  clopidogrel (PLAVIX) 75 MG tablet Take 1 tablet (75 mg total) by mouth daily. 02/19/17   Hongalgi, Lenis Dickinson, MD  diclofenac sodium (VOLTAREN) 1 % GEL Apply 2 g topically daily as needed (pain).     [provider]  diphenhydrAMINE (BENADRYL) 25 MG tablet Take 2 tablets (50 mg total) by mouth at bedtime as needed for sleep. 09/11/15  Evelina Bucy, MD  fluticasone [facility]) 50 MCG/ACT nasal spray Place 2 sprays into both nostrils daily.  09/10/15   [provider]  gabapentin (NEURONTIN) 300 MG capsule Take 300 mg by mouth 3 (three) times daily.     [provider]  ibuprofen (ADVIL,MOTRIN) 800 MG tablet Take 800 mg by mouth 3 (three) times daily.    [provider]  Linaclotide Rolan Lipa) 290 MCG CAPS capsule Take 290-580 mcg by mouth daily. Takes 1-2 caps daily    [provider]  loratadine (CLARITIN) 10 MG tablet Take 10 mg by mouth daily.    [provider]  methocarbamol (ROBAXIN) 750 MG tablet Take 750 mg by mouth 2 (two) times daily as needed for muscle spasms.     [provider]  mometasone (ELOCON) 0.1 % ointment Apply 1 application topically daily as needed (irritation).     [provider]  omeprazole (PRILOSEC) 20 MG capsule Take 20 mg by mouth 2 (two) times  daily before a meal.    [provider]  oxyCODONE-acetaminophen (PERCOCET) 10-325 MG tablet Take 1 tablet by mouth every 6 (six) hours as needed for pain. 01/16/17   Virgina Jock A, PA-C  potassium chloride 20 MEQ/15ML (10%) SOLN Take 15 mLs by mouth daily. 10/30/17   [provider]  potassium chloride SA (K-DUR,KLOR-CON) 20 MEQ tablet Take 2 tablets (40 mEq total) by mouth daily. 02/19/17   Hongalgi, Lenis Dickinson, MD  Vitamin D, Ergocalciferol, (DRISDOL) 50000 units CAPS capsule Take 50,000 Units by mouth every 7 (seven) days.    [provider]    Physical Exam: Vitals:   12/22/17 2107 12/22/17 2149 12/22/17 2301 12/23/17 0044  BP: (!) 113/58  (!) 107/51 (!) 99/52  Pulse: 60  (!) 49 (!) 52  Resp: 18  18 18   Temp: 98.1 F (36.7 C)     TempSrc: Oral     SpO2: 100%  100% 100%  Weight:  58.5 kg (129 lb)    Height:  5\' 1"  (1.549 m)     General: Not in acute distress HEENT: pt has a hard, non-movable mass in right neck.       Eyes: PERRL, EOMI, no scleral icterus.       ENT: No discharge from the ears and nose, no pharynx injection, no tonsillar enlargement.        Neck: No JVD, no bruit, no mass felt. Heme: No neck lymph node enlargement. Cardiac: S1/S2, RRR, No murmurs, No gallops or rubs. Respiratory:  No rales, wheezing, rhonchi or rubs. GI: Soft, nondistended, nontender, no rebound pain, no organomegaly, BS present. GU: No hematuria Ext: No pitting leg edema bilaterally. 2+DP/PT pulse bilaterally. Musculoskeletal: No joint deformities, No joint redness or warmth, no limitation of ROM in spin. Skin: No rashes.  Neuro: Alert, oriented X3, cranial nerves II-XII grossly intact, moves all extremities normally. Psych: Patient is not psychotic, no suicidal or hemocidal ideation.  Labs on Admission: I have personally reviewed following labs and imaging studies  CBC: Recent Labs  Lab 12/22/17 1613  WBC 9.7  HGB 12.8  HCT 36.2  MCV 82.8  PLT 106   Basic  Metabolic Panel: Recent Labs  Lab 12/22/17 1613 12/22/17 2142  NA 140 138  K 2.0* 2.4*  CL 113* 112*  CO2 17* 16*  GLUCOSE 97 105*  BUN 20 21*  CREATININE 1.19* 1.29*  CALCIUM 12.0* 11.9*  MG  --  2.2   GFR: Estimated Creatinine Clearance: 35.8 mL/min (  A) (by C-G formula based on SCr of 1.29 mg/dL (H)). Liver Function Tests: No results for input(s): AST, ALT, ALKPHOS, BILITOT, PROT, ALBUMIN in the last 168 hours. No results for input(s): LIPASE, AMYLASE in the last 168 hours. No results for input(s): AMMONIA in the last 168 hours. Coagulation Profile: No results for input(s): INR, PROTIME in the last 168 hours. Cardiac Enzymes: No results for input(s): CKTOTAL, CKMB, CKMBINDEX, TROPONINI in the last 168 hours. BNP (last 3 results) No results for input(s): PROBNP in the last 8760 hours. HbA1C: No results for input(s): HGBA1C in the last 72 hours. CBG: No results for input(s): GLUCAP in the last 168 hours. Lipid Profile: No results for input(s): CHOL, HDL, LDLCALC, TRIG, CHOLHDL, LDLDIRECT in the last 72 hours. Thyroid Function Tests: No results for input(s): TSH, T4TOTAL, FREET4, T3FREE, THYROIDAB in the last 72 hours. Anemia Panel: No results for input(s): VITAMINB12, FOLATE, FERRITIN, TIBC, IRON, RETICCTPCT in the last 72 hours. Urine analysis:    Component Value Date/Time   COLORURINE STRAW (A) 01/13/2017 1213   APPEARANCEUR CLEAR 01/13/2017 1213   LABSPEC 1.009 01/13/2017 1213   PHURINE 6.0 01/13/2017 1213   GLUCOSEU NEGATIVE 01/13/2017 1213   HGBUR NEGATIVE 01/13/2017 [place] 01/13/2017 1213   KETONESUR NEGATIVE 01/13/2017 1213   PROTEINUR NEGATIVE 01/13/2017 1213   UROBILINOGEN 0.2 09/11/2015 1300   NITRITE NEGATIVE 01/13/2017 1213   LEUKOCYTESUR NEGATIVE 01/13/2017 1213   Sepsis Labs: @LABRCNTIP (procalcitonin:4,lacticidven:4) )No results found for this or any previous visit (from the past 240 hour(s)).   Radiological Exams on  Admission: No results found.   EKG: Independently reviewed.  Sinus rhythm, QTC 375, T-wave flattening, early R-wave progression  Assessment/Plan Principal Problem:   Hypercalcemia Active Problems:   Essential hypertension   HLD (hyperlipidemia)   GERD (gastroesophageal reflux disease)   Stroke (cerebrum) (HCC)   Tobacco abuse   Mass in neck   Hypokalemia   Nausea vomiting and diarrhea   AKI (acute kidney injury) ([place])   Hypercalcemia: Ca=11.9. Pt has poor memory, but oriented x 3.  This is most likely due to malignancy. Pt is also on Vd supplement, which he may have contributed partially.  -will place on tele bed for obs - hold Vd supplement - check intact PTH, PTH related protein, 1,25-hydroxy vitamin D - Start the patient on aggressive hydration: 1L NS, then 150 cc/h.  - f/u by BMP. If not improvement-->May give calcitonin treatment or IV lasix after good hydration  Hypokalemia: K= 2.4  on admission. - Repleted with total of 40 x 2 orally and 10 x 3 by IV of KCl - Mg level is 2.2  Addendum: BMP at 3:13 AM showed K=2.4 ( this is after pt received oral 40 MEq and 30 mEQ by IV). Pt received another 40 mEQ of KCl at 4:50 AM, but this seems not to be enough. -will give another 91 mEQ of KCl.  -will repeat BMP at 7:00  HTN:  -hold lasix due to AKI -IV hydralazine prn  HLD: -lipitor  GERD: -Protonix  Hx of Stroke (cerebrum) ([place]): -on ASA and lipitor  Tobacco abuse: -Did counseling about importance of quitting smoking -Nicotine patch  AKI: Likely due to prerenal secondary to dehydration and continuation of NSAIDs and diuretics - IVF as above - Follow up renal function by BMP - Hold lasix and ibuprofen  Mass in neck: pt is being worked up by ENT, Dr. Blenda Nicely. She has had a CT scan of her neck and  chest which demonstrate metastatic lymph nodes with central necrosis. Had fine-needle aspiration biopsy, is planning for surgical biopsy currently. Pt was already  given referral to oncologist, Dr. Julien Nordmann, but not seen yet. -prn percocet for pain  Chronic Nausea vomiting and diarrhea: no symptoms of the day -observe now   DVT ppx: SQ Lovenox Code Status: Full code Family Communication:  Yes, patient's daughter at bed side Disposition Plan:  Anticipate discharge back to previous home environment Consults called: none Admission status: Obs / tele    Date of Service 12/23/2017    Ivor Costa [facility] Pager [phone]  If 7PM-7AM, please contact night-coverage [email or web address] Password [email or web address] 12/23/2017, 1:18 AM

## 2017-12-22 NOTE — ED Notes (Signed)
Potassium intake had to be reduced to 59ml/hr for patient comfort

## 2017-12-22 NOTE — Progress Notes (Signed)
CRITICAL VALUE ALERT  Critical Value:  Potassium 2.0  Date & Time Notied:  12/22/2017 1715  Provider Notified: Called Dr. Blenda Nicely   Orders Received/Actions taken: Patient not currently an inpatient. Lab value was from PAT appointment. Dr. Blenda Nicely states she will contact patient.

## 2017-12-22 NOTE — ED Provider Notes (Signed)
[facility] EMERGENCY DEPARTMENT Provider Note   CSN: [id number] Arrival date & time: 12/22/17  2100     History   Chief Complaint No chief complaint on file.   HPI Faith [name] is a 67 y.o. female.  HPI Pt had some pre op labs today in perparation of a neck,chest biopsy.  Pt is supposed to have the procedure on Friday.  SHe was called back because her potassium was 2.  Pt has been having chronic vomiting an diarrhea issues for the last month.   She has loose stools every two to three days.  SHe has been having episodes of vomiting in the morning.  No abd pain.  No fevers.  She was on a blood pressure medication 1 week ago that was changed. Past Medical History:  Diagnosis Date  . Anxiety   . Arthritis    "thighs; legs; hips" (07/05/2014)  . Bipolar disorder ([place])   . Cholelithiasis 07/2014  . Chronic bronchitis ([place])    "get it q yr"  . Chronic lower back pain   . Depression    pt. use to go to depression clinic, states she still has depression & anxiety but can't get to appt. so she hasn't had any med. for it in a while   . Dyspnea   . GERD (gastroesophageal reflux disease)   . High cholesterol   . Hypertension   . Peripheral vascular disease ([place])   . Schizophrenia ([place])   . Stroke [facility])     Patient Active Problem List   Diagnosis Date Noted  . Stroke-like symptom 02/14/2017  . Low potassium syndrome 02/14/2017  . PAD (peripheral artery disease) ([place]) 01/15/2017  . Nonhealing nonsurgical wound 12/05/2016  . HTN (hypertension) 07/06/2014  . Depression 07/06/2014  . Back pain 07/06/2014  . Chronic cholecystitis 07/05/2014  . Right sided abdominal pain 05/11/2014  . Cholelithiasis 05/11/2014    Past Surgical History:  Procedure Laterality Date  . ABDOMINAL AORTOGRAM N/A 01/07/2017   Procedure: Abdominal Aortogram;  Surgeon: Waynetta Sandy, MD;  Location: [facility];  Service: Cardiovascular;  Laterality: N/A;  . ABDOMINAL  HYSTERECTOMY    . ANKLE FRACTURE SURGERY Right   . ANKLE HARDWARE REMOVAL Right   . APPENDECTOMY  ~ 1963  . BREAST BIOPSY Bilateral   . BREAST CYST EXCISION Bilateral    "not cancer"  . CATARACT EXTRACTION W/ INTRAOCULAR LENS  IMPLANT, BILATERAL    . CHOLECYSTECTOMY N/A 07/05/2014   Procedure: LAPAROSCOPIC CHOLECYSTECTOMY WITH INTRAOPERATIVE CHOLANGIOGRAM;  Surgeon: Gayland Curry, MD;  Location: [facility];  Service: General;  Laterality: N/A;  . EXCISIONAL HEMORRHOIDECTOMY    . FEMORAL-POPLITEAL BYPASS GRAFT Right 01/15/2017   Procedure: BYPASS GRAFT FEMORAL-POPLITEAL ARTERY RIGHT LEG;  Surgeon: Waynetta Sandy, MD;  Location: [facility];  Service: Vascular;  Laterality: Right;  . LAPAROSCOPIC CHOLECYSTECTOMY  07/05/2014  . LOWER EXTREMITY ANGIOGRAPHY Bilateral 01/07/2017   Procedure: Lower Extremity Angiography;  Surgeon: Waynetta Sandy, MD;  Location: [facility];  Service: Cardiovascular;  Laterality: Bilateral;  . MULTIPLE TOOTH EXTRACTIONS  05/2013   "pulled my upper teeth"  . PILONIDAL CYST EXCISION      OB History    No data available       Home Medications    Prior to Admission medications   Medication Sig Start Date End Date Taking? Authorizing Provider  furosemide (LASIX) 20 MG tablet Take 20 mg by mouth daily as needed for fluid or edema.  Yes [provider]  albuterol (PROVENTIL HFA;VENTOLIN HFA) 108 (90 Base) MCG/ACT inhaler Inhale 1-2 puffs into the lungs every 6 (six) hours as needed for wheezing or shortness of breath.    [provider]  albuterol (PROVENTIL) (2.5 MG/3ML) 0.083% nebulizer solution Take 2.5 mg by nebulization every 6 (six) hours as needed for wheezing or shortness of breath.    [provider]  AMITIZA 24 MCG capsule Take 24 mcg by mouth 2 (two) times daily. 12/07/17   [provider]  aspirin EC 325 MG tablet Take 1 tablet (325 mg total) by mouth daily. 02/18/17   Hongalgi, Lenis Dickinson, MD  atorvastatin  (LIPITOR) 10 MG tablet take 1 tablet by mouth once daily 04/05/17   Waynetta Sandy, MD  clopidogrel (PLAVIX) 75 MG tablet Take 1 tablet (75 mg total) by mouth daily. 02/19/17   Hongalgi, Lenis Dickinson, MD  diclofenac sodium (VOLTAREN) 1 % GEL Apply 2 g topically daily as needed (pain).     [provider]  diphenhydrAMINE (BENADRYL) 25 MG tablet Take 2 tablets (50 mg total) by mouth at bedtime as needed for sleep. 09/11/15   Evelina Bucy, MD  fluticasone (FLONASE) 50 MCG/ACT nasal spray Place 2 sprays into both nostrils daily.  09/10/15   [provider]  gabapentin (NEURONTIN) 300 MG capsule Take 300 mg by mouth 3 (three) times daily.     [provider]  ibuprofen (ADVIL,MOTRIN) 800 MG tablet Take 800 mg by mouth 3 (three) times daily.    [provider]  Linaclotide Rolan Lipa) 290 MCG CAPS capsule Take 290-580 mcg by mouth daily. Takes 1-2 caps daily    [provider]  loratadine (CLARITIN) 10 MG tablet Take 10 mg by mouth daily.    [provider]  methocarbamol (ROBAXIN) 750 MG tablet Take 750 mg by mouth 2 (two) times daily as needed for muscle spasms.     [provider]  mometasone (ELOCON) 0.1 % ointment Apply 1 application topically daily as needed (irritation).     [provider]  omeprazole (PRILOSEC) 20 MG capsule Take 20 mg by mouth 2 (two) times daily before a meal.    [provider]  oxyCODONE-acetaminophen (PERCOCET) 10-325 MG tablet Take 1 tablet by mouth every 6 (six) hours as needed for pain. 01/16/17   Virgina Jock A, PA-C  potassium chloride 20 MEQ/15ML (10%) SOLN Take 15 mLs by mouth daily. 10/30/17   [provider]  potassium chloride SA (K-DUR,KLOR-CON) 20 MEQ tablet Take 2 tablets (40 mEq total) by mouth daily. 02/19/17   Hongalgi, Lenis Dickinson, MD  Vitamin D, Ergocalciferol, (DRISDOL) 50000 units CAPS capsule Take 50,000 Units by mouth every 7 (seven) days.    [provider]     Family History Family History  Problem Relation Age of Onset  . Cancer Mother 30       colon  . Diabetes Mother   . Hypertension Mother   . Cancer Father   . Diabetes Father   . Hypertension Father     Social History Social History   Tobacco Use  . Smoking status: Current Every Day Smoker    Packs/day: 1.00    Years: 48.00    Pack years: 48.00    Types: Cigarettes    Last attempt to quit: 12/31/2016    Years since quitting: 0.9  . Smokeless tobacco: Never Used  Substance Use Topics  . Alcohol use: No    Frequency: Never  .  Drug use: No    Comment: 15-20 yrs. ago- used marijuana      Allergies   Codeine; Penicillins; and Percocet [oxycodone-acetaminophen]   Review of Systems Review of Systems  Musculoskeletal: Positive for neck pain ( neck masses).  All other systems reviewed and are negative.    Physical Exam Updated Vital Signs BP (!) 113/58 (BP Location: Left Arm)   Pulse 60   Temp 98.1 F (36.7 C) (Oral)   Resp 18   Ht 1.549 m (5\' 1" )   Wt 58.5 kg (129 lb)   SpO2 100%   BMI 24.37 kg/m   Physical Exam  Constitutional: No distress.  HENT:  Head: Normocephalic and atraumatic.  Right Ear: External ear normal.  Left Ear: External ear normal.  Eyes: Conjunctivae are normal. Right eye exhibits no discharge. Left eye exhibits no discharge. No scleral icterus.  Neck: Neck supple. No tracheal deviation present.  Neck mass right side  Cardiovascular: Normal rate, regular rhythm and intact distal pulses.  Pulmonary/Chest: Effort normal and breath sounds normal. No stridor. No respiratory distress. She has no wheezes. She has no rales.  Abdominal: Soft. Bowel sounds are normal. She exhibits no distension. There is no tenderness. There is no rebound and no guarding.  Musculoskeletal: She exhibits no edema or tenderness.  Lymphadenopathy:    She has cervical adenopathy.  Neurological: She is alert. She has normal strength. No cranial nerve deficit (no  facial droop, extraocular movements intact, no slurred speech) or sensory deficit. She exhibits normal muscle tone. She displays no seizure activity. Coordination normal.  Skin: Skin is warm and dry. No rash noted. She is not diaphoretic.  Psychiatric: She has a normal mood and affect.  Nursing note and vitals reviewed.    ED Treatments / Results  Labs (all labs ordered are listed, but only abnormal results are displayed) Labs Reviewed  BASIC METABOLIC PANEL - Abnormal; Notable for the following components:      Result Value   Potassium 2.4 (*)    Chloride 112 (*)    CO2 16 (*)    Glucose, Bld 105 (*)    BUN 21 (*)    Creatinine, Ser 1.29 (*)    Calcium 11.9 (*)    GFR calc non Af Amer 42 (*)    GFR calc Af Amer 49 (*)    All other components within normal limits  MAGNESIUM    EKG  EKG Interpretation  Date/Time:  Tuesday December 22 2017 21:05:38 EST Ventricular Rate:  62 PR Interval:  146 QRS Duration: 80 QT Interval:  370 QTC Calculation: 375 R Axis:   39 Text Interpretation:  Normal sinus rhythm Nonspecific T wave abnormality Abnormal ECG No significant change since last tracing Confirmed by Dorie Rank [phone]) on 12/22/2017 9:51:49 PM       Procedures Procedures (including critical care time)  Medications Ordered in ED Medications  potassium chloride 10 mEq in 100 mL IVPB (10 mEq Intravenous New Bag/Given 12/22/17 2154)  0.9 %  sodium chloride infusion (not administered)  potassium chloride SA (K-DUR,KLOR-CON) CR tablet 40 mEq (40 mEq Oral Given 12/22/17 2154)     Initial Impression / Assessment and Plan / ED Course  I have reviewed the triage vital signs and the nursing notes.  Pertinent labs & imaging results that were available during my care of the patient were reviewed by me and considered in my medical decision making (see chart for details).   Patient presented to the emergency room for  further treatment of hypokalemia.  Patient's electrolytes show  worsening renal insufficiency associated with hypokalemia and a non-anion gap metabolic acidosis.  I have ordered IV fluids and potassium.  With her degree of hypokalemia and the desire to get her stabilized in preparation of her biopsy procedure , I will consult the medical service to bring her into the hospital for further treatment.  Final Clinical Impressions(s) / ED Diagnoses   Final diagnoses:  Hypokalemia  Neck mass  Renal insufficiency  Metabolic acidosis     Dorie Rank, MD 12/22/17 2239

## 2017-12-22 NOTE — Progress Notes (Addendum)
PCP - Dr. [name] Cardiologist - patient denies  Chest x-ray - n/a EKG - 01/07/2017 Stress Test - patient denies ECHO - 02/16/2017 Cardiac Cath - patient denies  Sleep Study - patient denies CPAP - n/a  Blood Thinner Instructions: patient instructed to stop plavix 3 days prior to surgery Aspirin Instructions: patient instructed to stop ASA 3 days prior to surgery  Anesthesia review: yes, abnormal EKG  Patient denies shortness of breath, fever, cough and chest pain at PAT appointment  Patient's BP was low at PAT appointment.  Patient's daughter stated that it was low last week at appointment with her PCP and PCP told patient to stop taking the Norvasc.  Daughter was unsure if patient stopped but said she will check her pill box.  Attempted to recheck BP at end of PAT appointment but patient left before I could.     Patient verbalized understanding of instructions that were given to them at the PAT appointment. Patient was also instructed that they will need to review over the PAT instructions again at home before surgery.

## 2017-12-23 ENCOUNTER — Encounter (HOSPITAL_COMMUNITY): Payer: Self-pay | Admitting: General Practice

## 2017-12-23 DIAGNOSIS — E860 Dehydration: Secondary | ICD-10-CM | POA: Diagnosis present

## 2017-12-23 DIAGNOSIS — E872 Acidosis: Secondary | ICD-10-CM | POA: Diagnosis present

## 2017-12-23 DIAGNOSIS — Z8673 Personal history of transient ischemic attack (TIA), and cerebral infarction without residual deficits: Secondary | ICD-10-CM | POA: Diagnosis not present

## 2017-12-23 DIAGNOSIS — C779 Secondary and unspecified malignant neoplasm of lymph node, unspecified: Secondary | ICD-10-CM | POA: Diagnosis present

## 2017-12-23 DIAGNOSIS — N179 Acute kidney failure, unspecified: Secondary | ICD-10-CM | POA: Diagnosis present

## 2017-12-23 DIAGNOSIS — Z9842 Cataract extraction status, left eye: Secondary | ICD-10-CM | POA: Diagnosis not present

## 2017-12-23 DIAGNOSIS — F419 Anxiety disorder, unspecified: Secondary | ICD-10-CM | POA: Diagnosis present

## 2017-12-23 DIAGNOSIS — Z9049 Acquired absence of other specified parts of digestive tract: Secondary | ICD-10-CM | POA: Diagnosis not present

## 2017-12-23 DIAGNOSIS — E876 Hypokalemia: Secondary | ICD-10-CM | POA: Diagnosis present

## 2017-12-23 DIAGNOSIS — Z79899 Other long term (current) drug therapy: Secondary | ICD-10-CM | POA: Diagnosis not present

## 2017-12-23 DIAGNOSIS — F17210 Nicotine dependence, cigarettes, uncomplicated: Secondary | ICD-10-CM | POA: Diagnosis present

## 2017-12-23 DIAGNOSIS — K219 Gastro-esophageal reflux disease without esophagitis: Secondary | ICD-10-CM | POA: Diagnosis present

## 2017-12-23 DIAGNOSIS — Z7982 Long term (current) use of aspirin: Secondary | ICD-10-CM | POA: Diagnosis not present

## 2017-12-23 DIAGNOSIS — E7800 Pure hypercholesterolemia, unspecified: Secondary | ICD-10-CM | POA: Diagnosis present

## 2017-12-23 DIAGNOSIS — I739 Peripheral vascular disease, unspecified: Secondary | ICD-10-CM | POA: Diagnosis present

## 2017-12-23 DIAGNOSIS — I10 Essential (primary) hypertension: Secondary | ICD-10-CM | POA: Diagnosis present

## 2017-12-23 DIAGNOSIS — E875 Hyperkalemia: Secondary | ICD-10-CM | POA: Diagnosis present

## 2017-12-23 DIAGNOSIS — G8929 Other chronic pain: Secondary | ICD-10-CM | POA: Diagnosis present

## 2017-12-23 DIAGNOSIS — F319 Bipolar disorder, unspecified: Secondary | ICD-10-CM | POA: Diagnosis present

## 2017-12-23 DIAGNOSIS — Z9841 Cataract extraction status, right eye: Secondary | ICD-10-CM | POA: Diagnosis not present

## 2017-12-23 DIAGNOSIS — Z961 Presence of intraocular lens: Secondary | ICD-10-CM | POA: Diagnosis present

## 2017-12-23 DIAGNOSIS — E870 Hyperosmolality and hypernatremia: Secondary | ICD-10-CM | POA: Diagnosis present

## 2017-12-23 DIAGNOSIS — Z7902 Long term (current) use of antithrombotics/antiplatelets: Secondary | ICD-10-CM | POA: Diagnosis not present

## 2017-12-23 DIAGNOSIS — R221 Localized swelling, mass and lump, neck: Secondary | ICD-10-CM | POA: Diagnosis not present

## 2017-12-23 DIAGNOSIS — F209 Schizophrenia, unspecified: Secondary | ICD-10-CM | POA: Diagnosis present

## 2017-12-23 LAB — BASIC METABOLIC PANEL
ANION GAP: 6 (ref 5–15)
Anion gap: 7 (ref 5–15)
Anion gap: 9 (ref 5–15)
BUN: 20 mg/dL (ref 6–20)
BUN: 17 mg/dL (ref 6–20)
BUN: 13 mg/dL (ref 6–20)
CALCIUM: 11.4 mg/dL — AB (ref 8.9–10.3)
CALCIUM: 11 mg/dL — AB (ref 8.9–10.3)
CO2: 16 mmol/L — AB (ref 22–32)
CO2: 17 mmol/L — AB (ref 22–32)
CO2: 17 mmol/L — AB (ref 22–32)
CREATININE: 0.99 mg/dL (ref 0.44–1.00)
CREATININE: 1.06 mg/dL — AB (ref 0.44–1.00)
Calcium: 10.8 mg/dL — ABNORMAL HIGH (ref 8.9–10.3)
Chloride: 113 mmol/L — ABNORMAL HIGH (ref 101–111)
Chloride: 120 mmol/L — ABNORMAL HIGH (ref 101–111)
Chloride: 120 mmol/L — ABNORMAL HIGH (ref 101–111)
Creatinine, Ser: 1.09 mg/dL — ABNORMAL HIGH (ref 0.44–1.00)
GFR calc Af Amer: >60 mL/min (ref >60)
GFR calc non Af Amer: 59 mL/min — ABNORMAL LOW (ref >60)
GFR calc non Af Amer: 54 mL/min — ABNORMAL LOW (ref >60)
GFR, EST NON AFRICAN AMERICAN: 52 mL/min — AB (ref >60)
GLUCOSE: 103 mg/dL — AB (ref 65–99)
Glucose, Bld: 106 mg/dL — ABNORMAL HIGH (ref 65–99)
Glucose, Bld: 88 mg/dL (ref 65–99)
POTASSIUM: 3 mmol/L — AB (ref 3.5–5.1)
Potassium: 2.4 mmol/L — CL (ref 3.5–5.1)
Potassium: 2.8 mmol/L — ABNORMAL LOW (ref 3.5–5.1)
SODIUM: 136 mmol/L (ref 135–145)
SODIUM: 146 mmol/L — AB (ref 135–145)
Sodium: 143 mmol/L (ref 135–145)

## 2017-12-23 LAB — HEPATIC FUNCTION PANEL
ALK PHOS: 78 U/L (ref 38–126)
ALT: 9 U/L — ABNORMAL LOW (ref 14–54)
AST: 17 U/L (ref 15–41)
Albumin: 2.7 g/dL — ABNORMAL LOW (ref 3.5–5.0)
BILIRUBIN TOTAL: 0.7 mg/dL (ref 0.3–1.2)
Total Protein: 5.1 g/dL — ABNORMAL LOW (ref 6.5–8.1)

## 2017-12-23 LAB — MAGNESIUM: Magnesium: 1.8 mg/dL (ref 1.7–2.4)

## 2017-12-23 MED ORDER — POTASSIUM CHLORIDE CRYS ER 20 MEQ PO TBCR
40.0000 meq | EXTENDED_RELEASE_TABLET | Freq: Once | ORAL | Status: DC
  Filled 2017-12-23: qty 2

## 2017-12-23 MED ORDER — OXYCODONE-ACETAMINOPHEN 5-325 MG PO TABS
1.0000 | ORAL_TABLET | Freq: Four times a day (QID) | ORAL | Status: DC | PRN
  Administered 2017-12-23 – 2017-12-25 (×5): 1 via ORAL
  Filled 2017-12-23 (×5): qty 1

## 2017-12-23 MED ORDER — POTASSIUM CHLORIDE 10 MEQ/100ML IV SOLN: 10.0000 meq | INTRAVENOUS | Status: DC

## 2017-12-23 MED ORDER — POTASSIUM CHLORIDE CRYS ER 20 MEQ PO TBCR
40.0000 meq | EXTENDED_RELEASE_TABLET | Freq: Three times a day (TID) | ORAL | Status: DC
  Administered 2017-12-23 – 2017-12-25 (×7): 40 meq via ORAL
  Filled 2017-12-23 (×6): qty 2

## 2017-12-23 MED ORDER — POLYETHYLENE GLYCOL 3350 17 G PO PACK
17.0000 g | PACK | Freq: Every day | ORAL | Status: DC
  Administered 2017-12-23 – 2017-12-25 (×3): 17 g via ORAL
  Filled 2017-12-23 (×3): qty 1

## 2017-12-23 MED ORDER — POTASSIUM CHLORIDE 10 MEQ/100ML IV SOLN
10.0000 meq | INTRAVENOUS | Status: AC
  Administered 2017-12-23 (×2): 10 meq via INTRAVENOUS
  Filled 2017-12-23 (×2): qty 100

## 2017-12-23 MED ORDER — OXYCODONE HCL 5 MG PO TABS
5.0000 mg | ORAL_TABLET | Freq: Four times a day (QID) | ORAL | Status: DC | PRN
Start: 2017-12-23 — End: 2017-12-25
  Administered 2017-12-23 – 2017-12-25 (×5): 5 mg via ORAL
  Filled 2017-12-23 (×5): qty 1

## 2017-12-23 MED ORDER — POTASSIUM CHLORIDE 10 MEQ/100ML IV SOLN
10.0000 meq | INTRAVENOUS | Status: AC
  Filled 2017-12-23: qty 100

## 2017-12-23 MED ORDER — POTASSIUM CHLORIDE IN NACL 40-0.9 MEQ/L-% IV SOLN
INTRAVENOUS | Status: DC
  Administered 2017-12-23: 125 mL/h via INTRAVENOUS
  Filled 2017-12-23: qty 1000

## 2017-12-23 MED ORDER — KCL IN DEXTROSE-NACL 40-5-0.45 MEQ/L-%-% IV SOLN
INTRAVENOUS | Status: DC
  Administered 2017-12-23: 12:00:00 via INTRAVENOUS
  Filled 2017-12-23 (×2): qty 1000

## 2017-12-23 MED ORDER — ZOLEDRONIC ACID 5 MG/100ML IV SOLN
5.0000 mg | Freq: Once | INTRAVENOUS | Status: DC
Start: 2017-12-23 — End: 2017-12-23
  Filled 2017-12-23: qty 100

## 2017-12-23 NOTE — Progress Notes (Signed)
OT Cancellation Note  Patient Details Name: Faith [name] MRN: [id number] DOB: Apr 28, 1951   Cancelled Treatment:    Reason Eval/Treat Not Completed: Medical issues which prohibited therapy(low BP). Will follow.  Malka So 12/23/2017, 1:23 PM  12/23/2017 Nestor Lewandowsky, OTR/L Pager: [phone]

## 2017-12-23 NOTE — Evaluation (Signed)
Physical Therapy Evaluation Patient Details Name: Faith [name] MRN: [id number] DOB: 10-14-1951 Today's Date: 12/23/2017   History of Present Illness  Pt is a 67 y/o female admitted secondary to abnormal lab value, hypokalemia. PMH including but not limited to hypertension, hyperlipidemia, stroke, GERD, depression, anxiety, schizophrenia, bipolar disorder, PVD, chronic back pain, tobacco abuse, neck mass (being evaluated at [facility])    Clinical Impression  Pt presented supine in bed with HOB elevated, awake and willing to participate in therapy session. Prior to admission, pt reported that she lives alone but has family and a neighbor that assist her whenever she needs it. Pt stated that she ambulates with use of SPC and requires assistance with ADLs from family. Pt limited this session secondary to soft BP (95/38 in supine, 88/54 in sitting), therefore, mobility assessment was limited. PT will f/u with pt acutely for mobility progression and to assist with d/c planning.    Follow Up Recommendations Other (comment)(potentially HHPT based on further mobility assessment)    Equipment Recommendations  None recommended by PT    Recommendations for Other Services       Precautions / Restrictions Precautions Precautions: Fall Restrictions Weight Bearing Restrictions: No      Mobility  Bed Mobility Overal bed mobility: Needs Assistance Bed Mobility: Supine to Sit     Supine to sit: Min assist     General bed mobility comments: increased time and effort, min A to elevate trunk  Transfers                 General transfer comment: deferred secondary to very soft BP (95/38 in supine, 88/54 in sitting)  Ambulation/Gait                Stairs            Wheelchair Mobility    Modified Rankin (Stroke Patients Only)       Balance                                             Pertinent Vitals/Pain Pain Assessment: 0-10 Pain Score: 8   Pain Location: R side of neck and R shoulder Pain Descriptors / Indicators: Sore Pain Intervention(s): Monitored during session;Repositioned    Home Living Family/patient expects to be discharged to:: Private residence Living Arrangements: Alone Available Help at Discharge: Family;Friend(s);Neighbor;Available 24 hours/day Type of Home: Apartment Home Access: Stairs to enter Entrance Stairs-Rails: Right;Left Entrance Stairs-Number of Steps: 7 Home Layout: One level Home Equipment: Grab bars - tub/shower;Shower seat;Walker - 2 wheels;Cane - single point      Prior Function Level of Independence: Needs assistance   Gait / Transfers Assistance Needed: uses SPC all the time  ADL's / Homemaking Assistance Needed: requires assistance with ADLs either from family or her neighbor        Hand Dominance   Dominant Hand: Right    Extremity/Trunk Assessment   Upper Extremity Assessment Upper Extremity Assessment: Overall WFL for tasks assessed    Lower Extremity Assessment Lower Extremity Assessment: Overall WFL for tasks assessed    Cervical / Trunk Assessment Cervical / Trunk Assessment: Kyphotic  Communication   Communication: No difficulties  Cognition Arousal/Alertness: Awake/alert Behavior During Therapy: WFL for tasks assessed/performed Overall Cognitive Status: No family/caregiver present to determine baseline cognitive functioning Area of Impairment: Memory  Memory: Decreased short-term memory                General Comments      Exercises     Assessment/Plan    PT Assessment Patient needs continued PT services  PT Problem List Decreased balance;Decreased mobility;Decreased coordination;Decreased safety awareness;Cardiopulmonary status limiting activity       PT Treatment Interventions DME instruction;Gait training;Stair training;Therapeutic activities;Functional mobility training;Therapeutic exercise;Balance  training;Neuromuscular re-education;Patient/family education    PT Goals (Current goals can be found in the Care Plan section)  Acute Rehab PT Goals Patient Stated Goal: return home PT Goal Formulation: With patient Time For Goal Achievement: 01/06/18 Potential to Achieve Goals: Good    Frequency Min 3X/week   Barriers to discharge        Co-evaluation               AM-PAC PT "6 Clicks" Daily Activity  Outcome Measure Difficulty turning over in bed (including adjusting bedclothes, sheets and blankets)?: A Little Difficulty moving from lying on back to sitting on the side of the bed? : Unable Difficulty sitting down on and standing up from a chair with arms (e.g., wheelchair, bedside commode, etc,.)?: Unable Help needed moving to and from a bed to chair (including a wheelchair)?: A Little Help needed walking in hospital room?: A Little Help needed climbing 3-5 steps with a railing? : A Little 6 Click Score: 14    End of Session   Activity Tolerance: Patient tolerated treatment well Patient left: in bed;with call bell/phone within reach;with bed alarm set;Other (comment)(sitting EOB to eat lunch) Nurse Communication: Mobility status;Other (comment)(very soft BP) PT Visit Diagnosis: Other abnormalities of gait and mobility (R26.89)    Time: 5697-9480 PT Time Calculation (min) (ACUTE ONLY): 17 min   Charges:   PT Evaluation $PT Eval Low Complexity: 1 Low     PT G Codes:        Ceex Haci, [place], [place] 12/23/2017, 12:55 PM

## 2017-12-23 NOTE — Progress Notes (Signed)
PROGRESS NOTE    Faith [name]  BTD:[id number] DOB: 20-Sep-1951 DOA: 12/22/2017 PCP: [name], MD   Brief Narrative: 67 year old female with history of hypertension, hyperlipidemia, stroke, acid reflux, anxiety depression, schizophrenia, bipolar, peripheral vascular disease, chronic back pain, tobacco abuse, neck mass who was admitted for abnormal lab, hypokalemia with serum potassium level of 2.  As per H&P, Per patient's daughter, patient has a right neck mass, which has been present for several months. Pt is being worked up in [facility] in [facility]. Patient recently had fine-needle aspiration biopsy by ENT, Dr. Blenda Nicely on 11/30/17. The result indicated possible malignancy, but not sure about the origin of malignancy. They are planning to do surgical biopsy. Pt went to her doctor's office for presurgical lab today. She was found to have hypokalemia with potassium 2.0, and was sent to ED for further eval and treatment.  In the ER patient was found to have serum potassium level of 2.4, calcium 11.9 admitted for further evaluation.  Assessment & Plan:   #Hypercalcemia likely in the setting of neck mass which is likely malignancy: Serum calcium level trending down to 11 from 12.  Continue IV fluid. -Ordered a dose of zoledronic acid. -Check liver function tests including albumin. -Follow-up PTH, vitamin D level.  #Hypokalemia: Magnesium level acceptable.  Replating oral and IV potassium chloride.  Repeat lab in the evening.  Continue telemetry monitoring.  #Mild hypernatremia due to NS.  Discontinue normal saline to half NS and D5  #Acute kidney injury in the setting of hypercalcemia: Serum creatinine level improving.  Monitor BMP.  Avoid nephrotoxins.  #History of hypertension: Monitor blood pressure.  #Hyperlipidemia: Continue Lipitor  #Mass in neck: pt is being worked up by ENT, Dr. Blenda Nicely. 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 Had fine-needle aspiration biopsy, is planning for surgical biopsy currently. Pt was already given referral to oncologist, Dr. Julien Nordmann, but not seen yet. -prn percocet for pain  PT, OT evaluation.  DVT prophylaxis: Lovenox subcutaneous Code Status: Full code Family Communication: No family at bedside Disposition Plan: Likely discharge home in 1-2 days    Consultants:   None  Procedures: None Antimicrobials: None  Subjective: Seen and examined at bedside.  Reported chronic pain.  Denies headache, dizziness, nausea, vomiting, chest pain.  wants stool softener.  Objective: Vitals:   12/23/17 0500 12/23/17 0622 12/23/17 0625 12/23/17 0834  BP: (!) 112/36  (!) 115/55 (!) 111/45  Pulse: (!) 47  (!) 50 (!) 55  Resp: 17  18   Temp:   98.3 F (36.8 C) (!) 97.4 F (36.3 C)  TempSrc:   Oral Oral  SpO2: 100%  100% 97%  Weight:  59 kg (130 lb 1.1 oz)    Height:  5\' 1"  (1.549 m)      Intake/Output Summary (Last 24 hours) at 12/23/2017 1133 Last data filed at 12/23/2017 0900 Gross per 24 hour  Intake 1440 ml  Output -  Net 1440 ml   Filed Weights   12/22/17 2149 12/23/17 0622  Weight: 58.5 kg (129 lb) 59 kg (130 lb 1.1 oz)    Examination:  General exam: Appears calm and comfortable  Respiratory system: Clear to auscultation. Respiratory effort normal. No wheezing or crackle Cardiovascular system: S1 & S2 heard, RRR.  No pedal edema. Gastrointestinal system: Abdomen is nondistended, soft and nontender. Normal bowel sounds heard. Central nervous system: Alert and oriented. No focal neurological  deficits. Extremities: Symmetric 5 x 5 power. Skin: No rashes, lesions or ulcers Psychiatry: Judgement and insight appear normal. Mood & affect appropriate.     Data Reviewed: I have personally reviewed following labs and imaging studies  CBC: Recent Labs  Lab 12/22/17 1613  WBC 9.7  HGB 12.8  HCT 36.2  MCV 82.8  PLT 131   Basic  Metabolic Panel: Recent Labs  Lab 12/22/17 1613 12/22/17 2142 12/23/17 0313 12/23/17 0657  NA 140 138 136 146*  K 2.0* 2.4* 2.4* 2.8*  CL 113* 112* 113* 120*  CO2 17* 16* 16* 17*  GLUCOSE 97 105* 106* 88  BUN 20 21* 20 17  CREATININE 1.19* 1.29* 0.99 1.06*  CALCIUM 12.0* 11.9* 11.4* 11.0*  MG  --  2.2  --   --    GFR: Estimated Creatinine Clearance: 43.7 mL/min (A) (by C-G formula based on SCr of 1.06 mg/dL (H)). Liver Function Tests: No results for input(s): AST, ALT, ALKPHOS, BILITOT, PROT, ALBUMIN in the last 168 hours. No results for input(s): LIPASE, AMYLASE in the last 168 hours. No results for input(s): AMMONIA in the last 168 hours. Coagulation Profile: No results for input(s): INR, PROTIME in the last 168 hours. Cardiac Enzymes: No results for input(s): CKTOTAL, CKMB, CKMBINDEX, TROPONINI in the last 168 hours. BNP (last 3 results) No results for input(s): PROBNP in the last 8760 hours. HbA1C: No results for input(s): HGBA1C in the last 72 hours. CBG: No results for input(s): GLUCAP in the last 168 hours. Lipid Profile: No results for input(s): CHOL, HDL, LDLCALC, TRIG, CHOLHDL, LDLDIRECT in the last 72 hours. Thyroid Function Tests: No results for input(s): TSH, T4TOTAL, FREET4, T3FREE, THYROIDAB in the last 72 hours. Anemia Panel: No results for input(s): VITAMINB12, FOLATE, FERRITIN, TIBC, IRON, RETICCTPCT in the last 72 hours. Sepsis Labs: No results for input(s): PROCALCITON, LATICACIDVEN in the last 168 hours.  No results found for this or any previous visit (from the past 240 hour(s)).       Radiology Studies: No results found.      Scheduled Meds: . aspirin EC  325 mg Oral Daily  . atorvastatin  10 mg Oral Daily  . clopidogrel  75 mg Oral Daily  . enoxaparin (LOVENOX) injection  40 mg Subcutaneous Daily  . fluticasone  2 spray Each Nare Daily  . gabapentin  300 mg Oral TID  . linaclotide  290 mcg Oral QAC breakfast  . loratadine  10 mg  Oral Daily  . lubiprostone  24 mcg Oral BID WC  . nicotine  21 mg Transdermal Daily  . pantoprazole  40 mg Oral Daily  . potassium chloride  40 mEq Oral Once  . potassium chloride  40 mEq Oral TID   Continuous Infusions: . dextrose 5 % and 0.45 % NaCl with KCl 40 mEq/L    . potassium chloride       LOS: 0 days    Jarae Nemmers Tanna Furry, MD [facility] Pager [phone]  If 7PM-7AM, please contact night-coverage [email or web address] Password [facility] 12/23/2017, 11:33 AM

## 2017-12-23 NOTE — Care Management Obs Status (Signed)
[place] NOTIFICATION   Patient Details  Name: Royce A Martinique MRN: [id number] Date of Birth: 27-Oct-1951   Medicare Observation Status Notification Given:  Yes    Carles Collet, RN 12/23/2017, 11:45 AM

## 2017-12-23 NOTE — ED Notes (Signed)
Diamantina Providence, RN witnessed waste of 250mg  MethoCarb.Marland KitchenMarland Kitchen

## 2017-12-24 LAB — BASIC METABOLIC PANEL
Anion gap: 5 (ref 5–15)
BUN: 12 mg/dL (ref 6–20)
CALCIUM: 10.5 mg/dL — AB (ref 8.9–10.3)
CHLORIDE: 123 mmol/L — AB (ref 101–111)
CO2: 16 mmol/L — AB (ref 22–32)
CREATININE: 0.86 mg/dL (ref 0.44–1.00)
GFR calc Af Amer: >60 mL/min (ref >60)
GFR calc non Af Amer: >60 mL/min (ref >60)
GLUCOSE: 89 mg/dL (ref 65–99)
Potassium: 3.8 mmol/L (ref 3.5–5.1)
Sodium: 144 mmol/L (ref 135–145)

## 2017-12-24 LAB — VITAMIN D 25 HYDROXY (VIT D DEFICIENCY, FRACTURES): Vit D, 25-Hydroxy: 39.8 ng/mL (ref 30.0–100.0)

## 2017-12-24 LAB — PARATHYROID HORMONE, INTACT (NO CA): PTH: 13 pg/mL — ABNORMAL LOW (ref 15–65)

## 2017-12-24 LAB — GLUCOSE, CAPILLARY: Glucose-Capillary: 102 mg/dL — ABNORMAL HIGH (ref 65–99)

## 2017-12-24 LAB — CALCITRIOL (1,25 DI-OH VIT D): VIT D 1 25 DIHYDROXY: 51.7 pg/mL (ref 19.9–79.3)

## 2017-12-24 MED ORDER — SODIUM BICARBONATE 650 MG PO TABS
1300.0000 mg | ORAL_TABLET | Freq: Two times a day (BID) | ORAL | Status: DC
  Administered 2017-12-24 – 2017-12-25 (×3): 1300 mg via ORAL
  Filled 2017-12-24 (×3): qty 2

## 2017-12-24 MED ORDER — SENNOSIDES-DOCUSATE SODIUM 8.6-50 MG PO TABS
2.0000 | ORAL_TABLET | Freq: Every day | ORAL | Status: DC
  Administered 2017-12-24: 2 via ORAL
  Filled 2017-12-24: qty 2

## 2017-12-24 MED ORDER — BISACODYL 10 MG RE SUPP
10.0000 mg | Freq: Once | RECTAL | Status: AC
  Administered 2017-12-24: 10 mg via RECTAL
  Filled 2017-12-24: qty 1

## 2017-12-24 MED ORDER — CHLORHEXIDINE GLUCONATE CLOTH 2 % EX PADS
6.0000 | MEDICATED_PAD | Freq: Once | CUTANEOUS | Status: AC
  Administered 2017-12-25: 6 via TOPICAL

## 2017-12-24 MED ORDER — POTASSIUM CHLORIDE CRYS ER 20 MEQ PO TBCR: 40.0000 meq | EXTENDED_RELEASE_TABLET | Freq: Two times a day (BID) | ORAL | Status: DC

## 2017-12-24 MED ORDER — DIPHENHYDRAMINE HCL 25 MG PO CAPS
25.0000 mg | ORAL_CAPSULE | ORAL | Status: DC | PRN
  Administered 2017-12-24: 25 mg via ORAL
  Filled 2017-12-24: qty 1

## 2017-12-24 MED ORDER — CHLORHEXIDINE GLUCONATE CLOTH 2 % EX PADS
6.0000 | MEDICATED_PAD | Freq: Once | CUTANEOUS | Status: AC
  Administered 2017-12-24: 6 via TOPICAL

## 2017-12-24 MED ORDER — DIPHENHYDRAMINE HCL 25 MG PO CAPS
25.0000 mg | ORAL_CAPSULE | Freq: Four times a day (QID) | ORAL | Status: DC | PRN
  Administered 2017-12-24 – 2017-12-25 (×2): 25 mg via ORAL
  Filled 2017-12-24 (×2): qty 1

## 2017-12-24 NOTE — Progress Notes (Addendum)
PROGRESS NOTE    Faith [name]  KCL:[id number] DOB: 26-Sep-1951 DOA: 12/22/2017 PCP: [name], MD   Brief Narrative: 67 year old female with history of hypertension, hyperlipidemia, stroke, acid reflux, anxiety depression, schizophrenia, bipolar, peripheral vascular disease, chronic back pain, tobacco abuse, neck mass who was admitted for abnormal lab, hypokalemia with serum potassium level of 2.  As per H&P, Per patient's daughter, patient has a right neck mass, which has been present for several months. Pt is being worked up in [facility] in [facility]. Patient recently had fine-needle aspiration biopsy by ENT, Dr. Blenda Nicely on 11/30/17. The result indicated possible malignancy, but not sure about the origin of malignancy. They are planning to do surgical biopsy. Pt went to her doctor's office for presurgical lab today. She was found to have hypokalemia with potassium 2.0, and was sent to ED for further eval and treatment.  In the ER patient was found to have serum potassium level of 2.4, calcium 11.9 admitted for further evaluation.  Assessment & Plan:   #Hypercalcemia likely in the setting of neck mass which is likely malignancy: Serum calcium level trending down to 10.5 from12 on admission.  Lower the rate of IV fluid.  Did not receive zoledronic acid yesterday. -Vitamin D level normal.  PTH level appropriately low.  Follow-up PTH related peptide which was ordered on admission.  #Hypokalemia: Magnesium level acceptable.  Continue oral potassium chloride supplement.  Potassium level of 3.8 today.  #Mild hypernatremia improved now.  #Acute kidney injury in the setting of hypercalcemia: Serum creatinine level improved.  Monitor BMP.  Start sodium bicarbonate.  #History of hypertension: Monitor blood pressure.  #Hyperlipidemia: Continue Lipitor  #Mass in neck: pt is being worked up by ENT, Dr. Blenda Nicely. She has had a CT scan of her neck and chest which demonstrate  metastatic lymph nodes with central necrosis. Had fine-needle aspiration biopsy, is planning for surgical biopsy currently. Pt was already given referral to oncologist, Dr. Julien Nordmann, but not seen yet. -prn percocet for pain -Received a call from [facility] [place], who recommended to keep patient n.p.o. from midnight for biopsy tomorrow.  Discussed with the patient.  PT, OT evaluation.  DVT prophylaxis: Hold Lovenox. Code Status: Full code Family Communication: No family at bedside Disposition Plan: Likely discharge home in 1-2 days    Consultants:   None  Procedures: None Antimicrobials: None  Subjective: Seen and examined at bedside.  Still having pain on the shoulder side which is chronic.  Denied nausea vomiting chest pain shortness of breath.  No abdominal pain.  Objective: Vitals:   12/23/17 1700 12/23/17 1957 12/24/17 0030 12/24/17 0413  BP: 138/60 (!) 112/51 (!) 104/46 125/65  Pulse: (!) 55 (!) 59 (!) 56 63  Resp:  18 18 18   Temp:  (!) 97.5 F (36.4 C) 98.8 F (37.1 C) (!) 97.5 F (36.4 C)  TempSrc:  Oral Oral Oral  SpO2: 100% 100% 100% 100%  Weight:    62.1 kg (136 lb 12.8 oz)  Height:        Intake/Output Summary (Last 24 hours) at 12/24/2017 1112 Last data filed at 12/24/2017 0945 Gross per 24 hour  Intake 2443.33 ml  Output 1350 ml  Net 1093.33 ml   Filed Weights   12/22/17 2149 12/23/17 0622 12/24/17 0413  Weight: 58.5 kg (129 lb) 59 kg (130 lb 1.1 oz) 62.1 kg (136 lb 12.8 oz)    Examination:  General exam: Sitting in bed comfortable. Respiratory system: Clear bilateral, no wheezing or  crackle. Cardiovascular system: Regular rate rhythm S1-S2 normal.  No pedal edema. Gastrointestinal system: Abdomen soft, nontender, nondistended.  Bowel sounds positive. Central nervous system: Alert and oriented. No focal neurological deficits. Skin: No rashes, lesions or ulcers Psychiatry: Judgement and insight appear normal. Mood & affect appropriate.     Data  Reviewed: I have personally reviewed following labs and imaging studies  CBC: Recent Labs  Lab 12/22/17 1613  WBC 9.7  HGB 12.8  HCT 36.2  MCV 82.8  PLT 562   Basic Metabolic Panel: Recent Labs  Lab 12/22/17 2142 12/23/17 0313 12/23/17 0657 12/23/17 1358 12/23/17 1838 12/24/17 0446  NA 138 136 146*  --  143 144  K 2.4* 2.4* 2.8*  --  3.0* 3.8  CL 112* 113* 120*  --  120* 123*  CO2 16* 16* 17*  --  17* 16*  GLUCOSE 105* 106* 88  --  103* 89  BUN 21* 20 17  --  13 12  CREATININE 1.29* 0.99 1.06*  --  1.09* 0.86  CALCIUM 11.9* 11.4* 11.0*  --  10.8* 10.5*  MG 2.2  --   --  1.8  --   --    GFR: Estimated Creatinine Clearance: 55.1 mL/min (by C-G formula based on SCr of 0.86 mg/dL). Liver Function Tests: Recent Labs  Lab 12/23/17 1358  AST 17  ALT 9*  ALKPHOS 78  BILITOT 0.7  PROT 5.1*  ALBUMIN 2.7*   No results for input(s): LIPASE, AMYLASE in the last 168 hours. No results for input(s): AMMONIA in the last 168 hours. Coagulation Profile: No results for input(s): INR, PROTIME in the last 168 hours. Cardiac Enzymes: No results for input(s): CKTOTAL, CKMB, CKMBINDEX, TROPONINI in the last 168 hours. BNP (last 3 results) No results for input(s): PROBNP in the last 8760 hours. HbA1C: No results for input(s): HGBA1C in the last 72 hours. CBG: Recent Labs  Lab 12/24/17 0626  GLUCAP 102*   Lipid Profile: No results for input(s): CHOL, HDL, LDLCALC, TRIG, CHOLHDL, LDLDIRECT in the last 72 hours. Thyroid Function Tests: No results for input(s): TSH, T4TOTAL, FREET4, T3FREE, THYROIDAB in the last 72 hours. Anemia Panel: No results for input(s): VITAMINB12, FOLATE, FERRITIN, TIBC, IRON, RETICCTPCT in the last 72 hours. Sepsis Labs: No results for input(s): PROCALCITON, LATICACIDVEN in the last 168 hours.  No results found for this or any previous visit (from the past 240 hour(s)).       Radiology Studies: No results found.      Scheduled Meds: .  aspirin EC  325 mg Oral Daily  . atorvastatin  10 mg Oral Daily  . clopidogrel  75 mg Oral Daily  . enoxaparin (LOVENOX) injection  40 mg Subcutaneous Daily  . fluticasone  2 spray Each Nare Daily  . gabapentin  300 mg Oral TID  . linaclotide  290 mcg Oral QAC breakfast  . loratadine  10 mg Oral Daily  . lubiprostone  24 mcg Oral BID WC  . nicotine  21 mg Transdermal Daily  . pantoprazole  40 mg Oral Daily  . polyethylene glycol  17 g Oral Daily  . potassium chloride  40 mEq Oral Once  . potassium chloride  40 mEq Oral TID  . sodium bicarbonate  1,300 mg Oral BID   Continuous Infusions: . dextrose 5 % and 0.45 % NaCl with KCl 40 mEq/L 100 mL/hr at 12/23/17 1222     LOS: 1 day    Amere Iott Tanna Furry, MD [facility]  Pager [phone]  If 7PM-7AM, please contact night-coverage [email or web address] Password [email or web address] 12/24/2017, 11:12 AM

## 2017-12-24 NOTE — Evaluation (Signed)
Occupational Therapy Evaluation and Discharge Patient Details Name: Faith [name] MRN: [id number] DOB: 04/17/1951 Today's Date: 12/24/2017    History of Present Illness Pt is a 67 y/o female admitted secondary to abnormal lab value, hypokalemia. PMH including but not limited to hypertension, hyperlipidemia, stroke, GERD, depression, anxiety, schizophrenia, bipolar disorder, PVD, chronic back pain, tobacco abuse, neck mass (being evaluated at [facility])   Clinical Impression   Pt demonstrated ability to perform ADL with set up to min guard assist. Her vision is poor. She is very near her baseline per her report and has 24 hour family support as needed at home. No further OT needs.    Follow Up Recommendations  No OT follow up    Equipment Recommendations  None recommended by OT    Recommendations for Other Services       Precautions / Restrictions Precautions Precautions: Fall Restrictions Weight Bearing Restrictions: No      Mobility Bed Mobility Overal bed mobility: Modified Independent             General bed mobility comments: HOB up  Transfers Overall transfer level: Needs assistance Equipment used: Straight cane Transfers: Sit to/from Stand Sit to Stand: Supervision         General transfer comment: for safety from bed and toilet    Balance Overall balance assessment: Needs assistance   Sitting balance-Leahy Scale: Good Sitting balance - Comments: no LOB with donning R sock     Standing balance-Leahy Scale: Poor Standing balance comment: requires cane, static balance at sink fair                           ADL either performed or assessed with clinical judgement   ADL Overall ADL's : At baseline                                       General ADL Comments: Pt demonstrated ability to dress with increased time, ambulate to bathroom and perform standing grooming with min guard assist. Pt reports feeling close to her  baseline     Vision Patient Visual Report: Blurring of vision;No change from baseline Additional Comments: pt reports blurred vision since her stroke     Perception     Praxis      Pertinent Vitals/Pain Pain Assessment: Faces Faces Pain Scale: Hurts a little bit Pain Location: R side of neck and R shoulder Pain Descriptors / Indicators: Sore Pain Intervention(s): Monitored during session     Hand Dominance Right   Extremity/Trunk Assessment Upper Extremity Assessment Upper Extremity Assessment: Overall WFL for tasks assessed   Lower Extremity Assessment Lower Extremity Assessment: Defer to PT evaluation   Cervical / Trunk Assessment Cervical / Trunk Assessment: Kyphotic   Communication Communication Communication: HOH   Cognition Arousal/Alertness: Awake/alert Behavior During Therapy: WFL for tasks assessed/performed Overall Cognitive Status: Within Functional Limits for tasks assessed                                     General Comments       Exercises     Shoulder Instructions      Home Living Family/patient expects to be discharged to:: Private residence Living Arrangements: Alone Available Help at Discharge: Family;Friend(s);Neighbor;Available 24 hours/day Type of Home: Apartment Home Access:  Stairs to enter [employer] of Steps: 7 Entrance Stairs-Rails: Right;Left Home Layout: One level     Bathroom Shower/Tub: Teacher, early years/pre: Standard     Home Equipment: Grab bars - tub/shower;Shower seat;Walker - 2 wheels;Cane - single point          Prior Functioning/Environment Level of Independence: Needs assistance  Gait / Transfers Assistance Needed: uses SPC all the time ADL's / Homemaking Assistance Needed: requires assistance with ADL and IADL either from family or her neighbor   Comments: plans to get an aide        OT Problem List:        OT Treatment/Interventions:      OT Goals(Current  goals can be found in the care plan section) Acute Rehab OT Goals Patient Stated Goal: return home  OT Frequency:     Barriers to D/C:            Co-evaluation              AM-PAC PT "6 Clicks" Daily Activity     Outcome Measure Help from another person eating meals?: None Help from another person taking care of personal grooming?: A Little Help from another person toileting, which includes using toliet, bedpan, or urinal?: A Little Help from another person bathing (including washing, rinsing, drying)?: A Little Help from another person to put on and taking off regular upper body clothing?: None Help from another person to put on and taking off regular lower body clothing?: A Little 6 Click Score: 20   End of Session Equipment Utilized During Treatment: Gait belt  Activity Tolerance: Patient tolerated treatment well Patient left: in chair;with call bell/phone within reach;with chair alarm set  OT Visit Diagnosis: Other abnormalities of gait and mobility (R26.89)                Time: 2706-2376 OT Time Calculation (min): 26 min Charges:  OT General Charges $OT Visit: 1 Visit OT Evaluation $OT Eval Moderate Complexity: 1 Mod OT Treatments $Self Care/Home Management : 8-22 mins G-Codes:     Malka So 12/24/2017, 9:36 AM  12/24/2017 Nestor Lewandowsky, OTR/L Pager: [phone]

## 2017-12-24 NOTE — Progress Notes (Signed)
Physical Therapy Treatment Patient Details Name: Faith [name] MRN: [id number] DOB: [birth year] Today's Date: 12/24/2017    History of Present Illness Pt is a 67 y/o female admitted 12/22/16 secondary to abnormal lab value, hypokalemia. Surgery scheduled 1/25 for incisional biopsy of right neck mass. PMH includes HTN, HLD, CVA, GERD, depression, anxiety, schizophrenia, bipolar disorder, PVD, chronic back pain, tobacco abuse, neck mass (being evaluated at [facility]).   PT Comments    Pt progressing with mobility. Today's session limited by patient needing to stay close to the bathroom. Able to amb around room and into/out of bathroom multiple times with use of SPC and intermittent supervision for safety. Recommend HHPT services to maximize functional mobility and independence. Will follow acutely.     Follow Up Recommendations  Home health PT     Equipment Recommendations  None recommended by PT    Recommendations for Other Services       Precautions / Restrictions Precautions Precautions: Fall Restrictions Weight Bearing Restrictions: No    Mobility  Bed Mobility               General bed mobility comments: Received sitting in chair  Transfers Overall transfer level: Needs assistance Equipment used: Straight cane Transfers: Sit to/from Stand Sit to Stand: Supervision         General transfer comment: Stood multiple times from bed and toilet, only requiring intermittent supervision for safety  Ambulation/Gait Ambulation/Gait assistance: Supervision Ambulation Distance (Feet): 50 Feet Assistive device: Straight cane Gait Pattern/deviations: Step-through pattern;Decreased stride length;Trunk flexed Gait velocity: Decreased Gait velocity interpretation: <1.8 ft/sec, indicative of risk for recurrent falls General Gait Details: Pt ambulated multiple times from chair into bathroom and back with SPC and rolling her IV pole, only requiring supervision for safety. Amb  outside room limited secondary to pt needing to stay close to bathroom   Stairs            Wheelchair Mobility    Modified Rankin (Stroke Patients Only)       Balance Overall balance assessment: Needs assistance   Sitting balance-Leahy Scale: Good       Standing balance-Leahy Scale: Poor Standing balance comment: requires cane, static balance at sink fair                            Cognition Arousal/Alertness: Awake/alert Behavior During Therapy: WFL for tasks assessed/performed Overall Cognitive Status: Within Functional Limits for tasks assessed                                        Exercises      General Comments        Pertinent Vitals/Pain Pain Assessment: Faces Faces Pain Scale: Hurts a little bit Pain Location: Generalized Pain Descriptors / Indicators: Discomfort Pain Intervention(s): Monitored during session    Home Living                      Prior Function            PT Goals (current goals can now be found in the care plan section) Acute Rehab PT Goals Patient Stated Goal: return home PT Goal Formulation: With patient Time For Goal Achievement: 01/06/18 Potential to Achieve Goals: Good Progress towards PT goals: Progressing toward goals    Frequency    Min 3X/week  PT Plan Discharge plan needs to be updated    Co-evaluation              AM-PAC PT "6 Clicks" Daily Activity  Outcome Measure  Difficulty turning over in bed (including adjusting bedclothes, sheets and blankets)?: None Difficulty moving from lying on back to sitting on the side of the bed? : A Little Difficulty sitting down on and standing up from a chair with arms (e.g., wheelchair, bedside commode, etc,.)?: A Little Help needed moving to and from a bed to chair (including a wheelchair)?: A Little Help needed walking in hospital room?: A Little Help needed climbing 3-5 steps with a railing? : A Little 6 Click  Score: 19    End of Session   Activity Tolerance: Patient tolerated treatment well Patient left: in chair;with call bell/phone within reach Nurse Communication: Mobility status PT Visit Diagnosis: Other abnormalities of gait and mobility (R26.89)     Time: 7473-4037 PT Time Calculation (min) (ACUTE ONLY): 13 min  Charges:  $Gait Training: 8-22 mins                    G Codes:      Faith [name], PT, DPT Acute Rehab Services  Pager: Faith [name] 12/24/2017, 3:16 PM

## 2017-12-24 NOTE — Progress Notes (Signed)
ENT  Note   Patient is scheduled for surgery tomorrow morning for incisional biopsy of right neck mass. Patient's electrolytes appear to be much more stable now after having hypokalemia a couple of days ago.   We will proceed with surgery tomorrow under local in the operating room. Patient may return to room tomorrow and potentially home tomorrow afternoon, if acceptable per primary team, after surgery.   Please ensure patient is NPO at midnight with MIVF in anticipation of surgery tomorrow.     Thank you for involving [facility] in the care of this patient. Should you need further assistance, please call our office at [phone].    Gavin Pound, MD

## 2017-12-25 ENCOUNTER — Encounter (HOSPITAL_COMMUNITY)
Admission: EM | Disposition: A | Discharge status: Home Health | Payer: Self-pay | Source: Home / Self Care | Attending: Nephrology

## 2017-12-25 ENCOUNTER — Inpatient Hospital Stay (HOSPITAL_COMMUNITY): Payer: Medicare Other | Admitting: Certified Registered Nurse Anesthetist

## 2017-12-25 ENCOUNTER — Ambulatory Visit (HOSPITAL_COMMUNITY): Admission: RE | Admit: 2017-12-25 | Payer: Medicare Other | Source: Ambulatory Visit | Admitting: Otolaryngology

## 2017-12-25 ENCOUNTER — Encounter (HOSPITAL_COMMUNITY): Payer: Self-pay | Admitting: *Deleted

## 2017-12-25 DIAGNOSIS — R221 Localized swelling, mass and lump, neck: Secondary | ICD-10-CM

## 2017-12-25 HISTORY — PX: MASS BIOPSY: SHX5445

## 2017-12-25 LAB — CBC
HEMATOCRIT: 28.8 % — AB (ref 36.0–46.0)
Hemoglobin: 9.9 g/dL — ABNORMAL LOW (ref 12.0–15.0)
MCH: 29.1 pg (ref 26.0–34.0)
MCHC: 34.4 g/dL (ref 30.0–36.0)
MCV: 84.7 fL (ref 78.0–100.0)
Platelets: 152 10*3/uL (ref 150–400)
RBC: 3.4 MIL/uL — ABNORMAL LOW (ref 3.87–5.11)
RDW: 14.7 % (ref 11.5–15.5)
WBC: 9 10*3/uL (ref 4.0–10.5)

## 2017-12-25 LAB — BASIC METABOLIC PANEL
ANION GAP: 5 (ref 5–15)
BUN: 9 mg/dL (ref 6–20)
CALCIUM: 10.4 mg/dL — AB (ref 8.9–10.3)
CO2: 15 mmol/L — AB (ref 22–32)
CREATININE: 0.9 mg/dL (ref 0.44–1.00)
Chloride: 123 mmol/L — ABNORMAL HIGH (ref 101–111)
GFR calc Af Amer: >60 mL/min (ref >60)
GFR calc non Af Amer: >60 mL/min (ref >60)
GLUCOSE: 83 mg/dL (ref 65–99)
Potassium: 4.5 mmol/L (ref 3.5–5.1)
Sodium: 143 mmol/L (ref 135–145)

## 2017-12-25 LAB — SURGICAL PCR SCREEN
MRSA, PCR: NEGATIVE
Staphylococcus aureus: NEGATIVE

## 2017-12-25 LAB — GLUCOSE, CAPILLARY: Glucose-Capillary: 81 mg/dL (ref 65–99)

## 2017-12-25 SURGERY — BIOPSY, MASS, NECK
Anesthesia: Monitor Anesthesia Care | Site: Neck | Laterality: Right

## 2017-12-25 MED ORDER — SODIUM BICARBONATE 650 MG PO TABS: 1300.0000 mg | ORAL_TABLET | Freq: Two times a day (BID) | ORAL | 0 refills | Status: DC

## 2017-12-25 MED ORDER — LIDOCAINE-EPINEPHRINE 2 %-1:100000 IJ SOLN
INTRAMUSCULAR | Status: AC
  Filled 2017-12-25: qty 1

## 2017-12-25 MED ORDER — LIDOCAINE-EPINEPHRINE 1 %-1:100000 IJ SOLN
INTRAMUSCULAR | Status: DC | PRN
  Administered 2017-12-25: 10 mL
  Administered 2017-12-25: 9 mL

## 2017-12-25 MED ORDER — ONDANSETRON HCL 4 MG/2ML IJ SOLN
INTRAMUSCULAR | Status: AC
  Filled 2017-12-25: qty 2

## 2017-12-25 MED ORDER — BACITRACIN ZINC 500 UNIT/GM EX OINT
TOPICAL_OINTMENT | CUTANEOUS | Status: DC | PRN
  Administered 2017-12-25: 1 via TOPICAL

## 2017-12-25 MED ORDER — 0.9 % SODIUM CHLORIDE (POUR BTL) OPTIME
TOPICAL | Status: DC | PRN
  Administered 2017-12-25: 1000 mL

## 2017-12-25 MED ORDER — LACTATED RINGERS IV SOLN
INTRAVENOUS | Status: DC
  Administered 2017-12-25: 08:00:00 via INTRAVENOUS

## 2017-12-25 MED ORDER — POLYETHYLENE GLYCOL 3350 17 G PO PACK: 17.0000 g | PACK | Freq: Every day | ORAL | 0 refills | Status: DC

## 2017-12-25 MED ORDER — PROPOFOL 10 MG/ML IV BOLUS
INTRAVENOUS | Status: AC
  Filled 2017-12-25: qty 20

## 2017-12-25 MED ORDER — ACETAMINOPHEN 325 MG PO TABS: 650.0000 mg | ORAL_TABLET | Freq: Four times a day (QID) | ORAL | Status: DC | PRN

## 2017-12-25 MED ORDER — BACITRACIN ZINC 500 UNIT/GM EX OINT
TOPICAL_OINTMENT | Freq: Two times a day (BID) | CUTANEOUS | Status: DC
  Administered 2017-12-25: 11:00:00 via TOPICAL
  Filled 2017-12-25: qty 28.35

## 2017-12-25 MED ORDER — BACITRACIN ZINC 500 UNIT/GM EX OINT
TOPICAL_OINTMENT | CUTANEOUS | Status: AC
  Filled 2017-12-25: qty 28.35

## 2017-12-25 MED ORDER — FENTANYL CITRATE (PF) 250 MCG/5ML IJ SOLN
INTRAMUSCULAR | Status: AC
  Filled 2017-12-25: qty 5

## 2017-12-25 MED ORDER — HEMOSTATIC AGENTS (NO CHARGE) OPTIME
TOPICAL | Status: DC | PRN
  Administered 2017-12-25: 1

## 2017-12-25 MED ORDER — LIDOCAINE-EPINEPHRINE 1 %-1:100000 IJ SOLN
INTRAMUSCULAR | Status: AC
  Filled 2017-12-25: qty 1

## 2017-12-25 MED ORDER — MIDAZOLAM HCL 2 MG/2ML IJ SOLN
INTRAMUSCULAR | Status: AC
  Filled 2017-12-25: qty 2

## 2017-12-25 MED ORDER — STERILE WATER FOR IRRIGATION IR SOLN
Status: DC | PRN
  Administered 2017-12-25: 1000 mL

## 2017-12-25 SURGICAL SUPPLY — 37 items
BLADE SURG 15 STRL LF DISP TIS (BLADE) IMPLANT
BLADE SURG 15 STRL SS (BLADE) ×2
CANISTER SUCT 3000ML PPV (MISCELLANEOUS) ×1 IMPLANT
CONT SPEC 4OZ CLIKSEAL STRL BL (MISCELLANEOUS) ×1 IMPLANT
COVER SURGICAL LIGHT HANDLE (MISCELLANEOUS) ×2 IMPLANT
CRADLE DONUT ADULT HEAD (MISCELLANEOUS) ×1 IMPLANT
DRSG TELFA 3X8 NADH (GAUZE/BANDAGES/DRESSINGS) ×4 IMPLANT
ELECT COATED BLADE 2.86 ST (ELECTRODE) ×2 IMPLANT
ELECT REM PT RETURN 9FT ADLT (ELECTROSURGICAL) ×2
ELECTRODE REM PT RTRN 9FT ADLT (ELECTROSURGICAL) ×1 IMPLANT
GLOVE BIO SURGEON STRL SZ 6.5 (GLOVE) ×2 IMPLANT
GLOVE BIO SURGEON STRL SZ7 (GLOVE) ×1 IMPLANT
GLOVE BIOGEL PI IND STRL 7.0 (GLOVE) IMPLANT
GLOVE BIOGEL PI IND STRL 7.5 (GLOVE) IMPLANT
GLOVE BIOGEL PI INDICATOR 7.0 (GLOVE) ×1
GLOVE BIOGEL PI INDICATOR 7.5 (GLOVE) ×1
GLOVE SURG SS PI 6.5 STRL IVOR (GLOVE) ×1 IMPLANT
GLOVE SURG SS PI 7.0 STRL IVOR (GLOVE) ×1 IMPLANT
GOWN STRL REUS W/ TWL LRG LVL3 (GOWN DISPOSABLE) ×2 IMPLANT
GOWN STRL REUS W/TWL LRG LVL3 (GOWN DISPOSABLE) ×6
HEMOSTAT SURGICEL 2X14 (HEMOSTASIS) ×1 IMPLANT
KIT BASIN OR (CUSTOM PROCEDURE TRAY) ×2 IMPLANT
KIT TURNOVER KIT B (KITS) ×1 IMPLANT
MARKER SKIN DUAL TIP RULER LAB (MISCELLANEOUS) ×1 IMPLANT
NDL HYPO 25GX1X1/2 BEV (NEEDLE) IMPLANT
NEEDLE HYPO 25GX1X1/2 BEV (NEEDLE) ×4 IMPLANT
NS IRRIG 1000ML POUR BTL (IV SOLUTION) ×2 IMPLANT
PAD ARMBOARD 7.5X6 YLW CONV (MISCELLANEOUS) ×3 IMPLANT
PAD DRESSING TELFA 3X8 NADH (GAUZE/BANDAGES/DRESSINGS) IMPLANT
PENCIL BUTTON HOLSTER BLD 10FT (ELECTRODE) ×2 IMPLANT
RUBBERBAND STERILE (MISCELLANEOUS) ×1 IMPLANT
SUT PLAIN 5 0 P 3 18 (SUTURE) ×1 IMPLANT
SUT VIC AB 4-0 PS2 18 (SUTURE) ×1 IMPLANT
SYR CONTROL 10ML LL (SYRINGE) ×2 IMPLANT
TOWEL NATURAL 6PK STERILE (DISPOSABLE) ×2 IMPLANT
TRAY ENT MC OR (CUSTOM PROCEDURE TRAY) ×2 IMPLANT
WATER STERILE IRR 1000ML POUR (IV SOLUTION) ×2 IMPLANT

## 2017-12-25 NOTE — Transfer of Care (Signed)
Immediate Anesthesia Transfer of Care Note  Patient: Faith [name]  Procedure(s) Performed: INCISIONAL RIGHT NECK MASS BIOPSY (Right Neck)  Patient Location: PACU  Anesthesia Type:MAC  Level of Consciousness: awake, alert  and oriented  Airway & Oxygen Therapy: Patient Spontanous Breathing  Post-op Assessment: Report given to RN and Post -op Vital signs reviewed and stable  Post vital signs: Reviewed and stable  Last Vitals:  Vitals:   12/24/17 2001 12/25/17 0612  BP: (!) 126/53 (!) 98/45  Pulse: 71 68  Resp: 17 18  Temp: 36.5 C 37.3 C  SpO2: 95% 99%    Last Pain:  Vitals:   12/25/17 0612  TempSrc: Oral  PainSc:       Patients Stated Pain Goal: 0 (27/51/70 0174)  Complications: No apparent anesthesia complications

## 2017-12-25 NOTE — Care Management Important Message (Signed)
Important Message  Patient Details  Name: Faith [name] MRN: [id number] Date of Birth: 01-06-1951   Medicare Important Message Given:  Yes    Carles Collet, RN 12/25/2017, 11:27 AM

## 2017-12-25 NOTE — H&P (Signed)
The surgical history remains accurate and without interval change. The condition still exists which makes the procedure necessary. The patient and/or family is aware of their condition and has been informed of the risks and benefits of surgery, as well as alternatives. All parties have elected to proceed with surgery.   Surgical plan: incisional biopsy right neck mass  Helayne Seminole

## 2017-12-25 NOTE — Anesthesia Postprocedure Evaluation (Signed)
Anesthesia Post Note  Patient: Faith [name]  Procedure(s) Performed: INCISIONAL RIGHT NECK MASS BIOPSY (Right Neck)     Patient location during evaluation: PACU Anesthesia Type: General Level of consciousness: awake Pain management: pain level controlled Vital Signs Assessment: post-procedure vital signs reviewed and stable Respiratory status: spontaneous breathing Cardiovascular status: stable Anesthetic complications: no    Last Vitals:  Vitals:   12/25/17 0957 12/25/17 1000  BP: (!) 105/56   Pulse: 68   Resp: 14   Temp:  36.6 C  SpO2: 100%     Last Pain:  Vitals:   12/25/17 0942  TempSrc:   PainSc: 0-No pain                 Anjalee Cope

## 2017-12-25 NOTE — Discharge Summary (Signed)
Physician Discharge Summary  Faith [name] NTI:[id number] DOB: [birth year] DOA: 12/22/2017  PCP: [name], MD  Admit date: 12/22/2017 Discharge date: 12/25/2017  Admitted From:home Disposition:home  Recommendations for Outpatient Follow-up:  1. Follow up with PCP in 1-2 weeks 2. Please obtain BMP/CBC in one week 3. Please follow-up with ENT for the biopsy result.  Home Health: Yes Equipment/Devices: No Discharge Condition: Stable CODE STATUS: Full code  diet recommendation: Heart healthy  Brief/Interim Summary: 67 year old female with history of hypertension, hyperlipidemia, stroke, acid reflux, anxiety depression, schizophrenia, bipolar, peripheral vascular disease, chronic back pain, tobacco abuse, neck mass who was admitted for abnormal lab, hypokalemia with serum potassium level of 2.  As per H&P, Perpatient's daughter, patient has a right neck mass, which has been present for several months. Pt is being worked up [facility] in [facility]. Patient recently had fine-needle aspiration biopsy by ENT, Dr. Blenda Nicely on 11/30/17. The result indicated possible malignancy, but not sure about the origin of malignancy. They are planning to do surgical biopsy. Pt went to herdoctor's office for presurgical labtoday. She was found to have hypokalemia with potassium 2.0, and was sent to ED for further eval and treatment.  In the ER patient was found to have serum potassium level of 2.4, calcium 12 admitted for further evaluation.  #Hypercalcemia likely in the setting of neck mass which is likely malignancy: Serum calcium level trending down to 10.4 from12 on admission.   -Vitamin D level normal.  PTH level appropriately low.  -Recommended to monitor labs as outpatient.  Patient has good oral intake.  Encourage hydration.  #Hypokalemia: Magnesium level acceptable.    Continue oral potassium supplement.  Now electrolytes improved.  #Mild hypernatremia improved  now.  #Acute kidney injury in the setting of hypercalcemia: Serum creatinine level improved.    On sodium bicarbonate for acidosis.  Repeat lab in a week.  #History of hypertension: Monitor blood pressure.  #Hyperlipidemia: Continue Lipitor  #Mass in neck: pt is being worked up by ENT, Dr. Blenda Nicely.She has had a CT scan of her neck and chest which demonstrate metastatic lymph nodes with central necrosis.Hadfine-needle aspiration biopsy, is planning for surgical biopsy currently.Pt was already given referral tooncologist, Dr. Julien Nordmann, but notseen yet. -prn percocet for pain -Patient underwent biopsy today.  Incision site looks clean.  Recommend to apply ice and to continue pain medication.  Patient will follow-up with ENT and oncology outpatient.  Patient had a biopsy today.  Potassium and calcium level improved.  Renal function improved.  Tolerating diet well.  Evaluated by PT OT.  Discharging home with home care services.  On potassium supplement.  Discussed with the patient and daughter at bedside today.  Patient reported chronic right leg pain.  Patient was examined at bedside.  X-rays of the ankle and knee with no acute finding.  Patient has a good dorsalis pedis pulse.  Warm to touch.  On Lasix as needed for edema.  She follow-up with a vascular and plan for Doppler study next month.  I recommend to keep the appointment and the testing.  Stable to discharge with outpatient follow-up.    Discharge Diagnoses:  Principal Problem:   Hypercalcemia Active Problems:   Essential hypertension   HLD (hyperlipidemia)   GERD (gastroesophageal reflux disease)   Stroke (cerebrum) (HCC)   Tobacco abuse   Mass in neck   Hypokalemia   Nausea vomiting and diarrhea   AKI (acute kidney injury) ([place])   Hyperkalemia    Discharge Instructions  Discharge Instructions    Call MD for:  difficulty breathing, headache or visual disturbances   Complete by:  As directed    Call MD for:   extreme fatigue   Complete by:  As directed    Call MD for:  hives   Complete by:  As directed    Call MD for:  persistant dizziness or light-headedness   Complete by:  As directed    Call MD for:  persistant nausea and vomiting   Complete by:  As directed    Call MD for:  redness, tenderness, or signs of infection (pain, swelling, redness, odor or green/yellow discharge around incision site)   Complete by:  As directed    Call MD for:  severe uncontrolled pain   Complete by:  As directed    Call MD for:  temperature >100.4   Complete by:  As directed    Diet - low sodium heart healthy   Complete by:  As directed    Discharge instructions   Complete by:  As directed    Please apply ice compression at the site of biopsy and take pain medication.  Please follow-up with ENT for the biopsy result.  Continue to follow-up with vascular surgeon for ultrasound study and follow-up.  Please check lab including BMP within a week with her PCP to monitor electrolytes.   Increase activity slowly   Complete by:  As directed      Allergies as of 12/25/2017      Reactions   Penicillins Itching, Other (See Comments)   Causes yeast infections  PATIENT HAS HAD A PCN REACTION WITH IMMEDIATE RASH, FACIAL/TONGUE/THROAT SWELLING, SOB, OR LIGHTHEADEDNESS WITH HYPOTENSION:  #  #  #  YES  #  #  #   Has patient had a PCN reaction causing severe rash involving mucus membranes or skin necrosis: Unk Has patient had a PCN reaction that required hospitalization: No Has patient had a PCN reaction occurring within the last 10 years: #  #  #  YES  #  #  #  If all of the above answers are "NO", then may proceed with Cephalosporin    Codeine Itching, Other (See Comments)   And yeast infections   Percocet [oxycodone-acetaminophen] Itching   Tolerates with Benadryl      Medication List    STOP taking these medications   ibuprofen 800 MG tablet Commonly known as:  ADVIL,MOTRIN     TAKE these medications    acetaminophen 325 MG tablet Commonly known as:  TYLENOL Take 2 tablets (650 mg total) by mouth every 6 (six) hours as needed for mild pain (or Fever >/= 101).   albuterol (2.5 MG/3ML) 0.083% nebulizer solution Commonly known as:  PROVENTIL Take 2.5 mg by nebulization every 6 (six) hours as needed for wheezing or shortness of breath.   albuterol 108 (90 Base) MCG/ACT inhaler Commonly known as:  PROVENTIL HFA;VENTOLIN HFA Inhale 1-2 puffs into the lungs every 6 (six) hours as needed for wheezing or shortness of breath.   AMITIZA 24 MCG capsule Generic drug:  lubiprostone Take 24 mcg by mouth 2 (two) times daily.   aspirin EC 325 MG tablet Take 1 tablet (325 mg total) by mouth daily.   atorvastatin 10 MG tablet Commonly known as:  LIPITOR take 1 tablet by mouth once daily   clopidogrel 75 MG tablet Commonly known as:  PLAVIX Take 1 tablet (75 mg total) by mouth daily.   diclofenac sodium 1 %  Gel Commonly known as:  VOLTAREN Apply 2 g topically daily as needed (pain).   diphenhydrAMINE 25 MG tablet Commonly known as:  BENADRYL Take 2 tablets (50 mg total) by mouth at bedtime as needed for sleep.   fluticasone 50 MCG/ACT nasal spray Commonly known as:  FLONASE Place 2 sprays into both nostrils daily.   furosemide 20 MG tablet Commonly known as:  LASIX Take 20 mg by mouth daily as needed for fluid or edema.   gabapentin 300 MG capsule Commonly known as:  NEURONTIN Take 300 mg by mouth 3 (three) times daily.   LINZESS 290 MCG Caps capsule Generic drug:  linaclotide Take 290-580 mcg by mouth daily as needed (constipation).   loratadine 10 MG tablet Commonly known as:  CLARITIN Take 10 mg by mouth daily.   methocarbamol 750 MG tablet Commonly known as:  ROBAXIN Take 750 mg by mouth 2 (two) times daily as needed for muscle spasms.   mometasone 0.1 % ointment Commonly known as:  ELOCON Apply 1 application topically daily as needed (irritation).   omeprazole 20 MG  capsule Commonly known as:  PRILOSEC Take 20 mg by mouth 2 (two) times daily before a meal.   oxyCODONE-acetaminophen 10-325 MG tablet Commonly known as:  PERCOCET Take 1 tablet by mouth every 6 (six) hours as needed for pain.   polyethylene glycol packet Commonly known as:  MIRALAX / GLYCOLAX Take 17 g by mouth daily. Start taking on:  12/26/2017   potassium chloride 20 MEQ/15ML (10%) Soln Take 15 mLs by mouth daily.   sodium bicarbonate 650 MG tablet Take 2 tablets (1,300 mg total) by mouth 2 (two) times daily.   Vitamin D (Ergocalciferol) 50000 units Caps capsule Commonly known as:  DRISDOL Take 50,000 Units by mouth every 7 (seven) days.      Follow-up Information    [name], MD. Schedule an appointment as soon as possible for a visit in 1 week(s).   Specialty:  Internal Medicine Contact information: [place] [address] [phone]        Helayne Seminole, MD. Schedule an appointment as soon as possible for a visit in 2 week(s).   Specialty:  Otolaryngology Contact information: [address] [place] [address] [phone]          Allergies  Allergen Reactions  . Penicillins Itching and Other (See Comments)    Causes yeast infections  PATIENT HAS HAD A PCN REACTION WITH IMMEDIATE RASH, FACIAL/TONGUE/THROAT SWELLING, SOB, OR LIGHTHEADEDNESS WITH HYPOTENSION:  #  #  #  YES  #  #  #   Has patient had a PCN reaction causing severe rash involving mucus membranes or skin necrosis: Unk Has patient had a PCN reaction that required hospitalization: No Has patient had a PCN reaction occurring within the last 10 years: #  #  #  YES  #  #  #  If all of the above answers are "NO", then may proceed with Cephalosporin   . Codeine Itching and Other (See Comments)    And yeast infections  . Percocet [Oxycodone-Acetaminophen] Itching    Tolerates with Benadryl    Consultations: none  Procedures/Studies: Biopsy by  ENT  Subjective: Seen and examined at bedside.  Denied headache, dizziness, nausea, vomiting, chest pain, shortness of breath.  Has a right leg pain which is chronic in nature.  Discharge Exam: Vitals:   12/25/17 1100 12/25/17 1200  BP: 103/73 (!) 108/46  Pulse: 67  70  Resp: 14 16  Temp: 98.3 F (36.8 C) 97.7 F (36.5 C)  SpO2: 100% 100%   Vitals:   12/25/17 0957 12/25/17 1000 12/25/17 1100 12/25/17 1200  BP: (!) 105/56  103/73 (!) 108/46  Pulse: 68  67 70  Resp: 14  14 16   Temp:  97.9 F (36.6 C) 98.3 F (36.8 C) 97.7 F (36.5 C)  TempSrc:      SpO2: 100%  100% 100%  Weight:      Height:        General: Pt is alert, awake, not in acute distress Cardiovascular: RRR, S1/S2 +, no rubs, no gallops Respiratory: CTA bilaterally, no wheezing, no rhonchi Abdominal: Soft, NT, ND, bowel sounds + Extremities: Trace none pitting edema of right ankle area which is chronic as per patient.  No redness.  Right dorsalis pedis pulse palpable.  No tender, warm.    The results of significant diagnostics from this hospitalization (including imaging, microbiology, ancillary and laboratory) are listed below for reference.     Microbiology: Recent Results (from the past 240 hour(s))  Surgical pcr screen     Status: None   Collection Time: 12/24/17  9:34 PM  Result Value Ref Range Status   MRSA, PCR NEGATIVE NEGATIVE Final   Staphylococcus aureus NEGATIVE NEGATIVE Final    Comment: (NOTE) The Xpert SA Assay (FDA approved for NASAL specimens in patients 19 years of age and older), is one component of a comprehensive surveillance program. It is not intended to diagnose infection nor to guide or monitor treatment.      Labs: BNP (last 3 results) No results for input(s): BNP in the last 8760 hours. Basic Metabolic Panel: Recent Labs  Lab 12/22/17 2142 12/23/17 0313 12/23/17 0657 12/23/17 1358 12/23/17 1838 12/24/17 0446 12/25/17 0611  NA 138 136 146*  --  143 144 143  K  2.4* 2.4* 2.8*  --  3.0* 3.8 4.5  CL 112* 113* 120*  --  120* 123* 123*  CO2 16* 16* 17*  --  17* 16* 15*  GLUCOSE 105* 106* 88  --  103* 89 83  BUN 21* 20 17  --  13 12 9   CREATININE 1.29* 0.99 1.06*  --  1.09* 0.86 0.90  CALCIUM 11.9* 11.4* 11.0*  --  10.8* 10.5* 10.4*  MG 2.2  --   --  1.8  --   --   --    Liver Function Tests: Recent Labs  Lab 12/23/17 1358  AST 17  ALT 9*  ALKPHOS 78  BILITOT 0.7  PROT 5.1*  ALBUMIN 2.7*   No results for input(s): LIPASE, AMYLASE in the last 168 hours. No results for input(s): AMMONIA in the last 168 hours. CBC: Recent Labs  Lab 12/22/17 1613 12/25/17 0611  WBC 9.7 9.0  HGB 12.8 9.9*  HCT 36.2 28.8*  MCV 82.8 84.7  PLT 203 152   Cardiac Enzymes: No results for input(s): CKTOTAL, CKMB, CKMBINDEX, TROPONINI in the last 168 hours. BNP: Invalid input(s): POCBNP CBG: Recent Labs  Lab 12/24/17 0626 12/25/17 0618  GLUCAP 102* 81   D-Dimer No results for input(s): DDIMER in the last 72 hours. Hgb A1c No results for input(s): HGBA1C in the last 72 hours. Lipid Profile No results for input(s): CHOL, HDL, LDLCALC, TRIG, CHOLHDL, LDLDIRECT in the last 72 hours. Thyroid function studies No results for input(s): TSH, T4TOTAL, T3FREE, THYROIDAB in the last 72 hours.  Invalid input(s): FREET3 Anemia work up No results for  input(s): VITAMINB12, FOLATE, FERRITIN, TIBC, IRON, RETICCTPCT in the last 72 hours. Urinalysis    Component Value Date/Time   COLORURINE STRAW (A) 01/13/2017 1213   APPEARANCEUR CLEAR 01/13/2017 1213   LABSPEC 1.009 01/13/2017 1213   PHURINE 6.0 01/13/2017 1213   GLUCOSEU NEGATIVE 01/13/2017 1213   HGBUR NEGATIVE 01/13/2017 1213   [place] 01/13/2017 1213   KETONESUR NEGATIVE 01/13/2017 1213   PROTEINUR NEGATIVE 01/13/2017 1213   UROBILINOGEN 0.2 09/11/2015 1300   NITRITE NEGATIVE 01/13/2017 1213   LEUKOCYTESUR NEGATIVE 01/13/2017 1213   Sepsis Labs Invalid input(s): PROCALCITONIN,  WBC,   LACTICIDVEN Microbiology Recent Results (from the past 240 hour(s))  Surgical pcr screen     Status: None   Collection Time: 12/24/17  9:34 PM  Result Value Ref Range Status   MRSA, PCR NEGATIVE NEGATIVE Final   Staphylococcus aureus NEGATIVE NEGATIVE Final    Comment: (NOTE) The Xpert SA Assay (FDA approved for NASAL specimens in patients 102 years of age and older), is one component of a comprehensive surveillance program. It is not intended to diagnose infection nor to guide or monitor treatment.      Time coordinating discharge:  30 minutes  SIGNED:   Rosita Fire, MD  [facility] 12/25/2017, 1:08 PM  If 7PM-7AM, please contact night-coverage [email or web address] Password [email or web address]

## 2017-12-25 NOTE — Op Note (Signed)
DATE OF PROCEDURE:  12/25/2017    PRE-OPERATIVE DIAGNOSIS:  right neck mass    POST-OPERATIVE DIAGNOSIS:  Same    PROCEDURE(S): Incisional biopsy of right neck mass   SURGEON:  Gavin Pound, MD    ASSISTANT(S):  none    ANESTHESIA:  Local    ESTIMATED BLOOD LOSS:  10 mL   SPECIMENS:  Right neck mass for lymphoma protocol    COMPLICATIONS:  None    OPERATIVE FINDINGS: There was a firm, nondiscrete right neck mass which demonstrated minimal mobility. It was very dysmorphic on exam. The mass was somewhat friable and did demonstrate a cental necrotic fluid. Bled somewhat, not excessive.     OPERATIVE DETAILS: The patient was brought to the operating room and placed in the semirecumbent position. Local anesthesia with 1% lidocaine with 1:100,000 epinephrine was injected around the right neck mass. The patient was prepped and draped in the usual sterile fashion.  A 2.5 cm incision was placed over the right neck mass in a natural horizontal neck crease. Sharp dissection was carried through skin, soft tissue, and down to the mass. A section of the mass was grasped with forceps and excised sharply with scissors. The neck was copiously irrigated. Specimens were passed off the field. Hemostasis was achieved with cautery and Surgicel. Deep layers were closed with buried interrupted 4.0 Vicryl. Skin was closed with simple interrupted 5.0 plain gut. The skin was cleansed. Bacitracin was applied. The patient tolerated the procedure well and was transported to PACU in good condition.

## 2017-12-25 NOTE — Care Management Note (Signed)
Case Management Note  Patient Details  Name: Faith [name] MRN: [id number] Date of Birth: [birth year]  Subjective/Objective:   Hypokalemia                Action/Plan: Patient discharging home with [facility] provided by [facility]; Stanton Kidney with [facility] called for arrangements;   Expected Discharge Date:  12/25/17               Expected Discharge Plan:  [place]  Choice offered to:  Patient  HH Arranged:  RN, PT, Nurse's Aide [place] Agency:   [facility]  Status of Service:  In process, will continue to follow  Sherrilyn Rist [phone] 12/25/2017, 1:18 PM

## 2017-12-25 NOTE — Anesthesia Preprocedure Evaluation (Signed)
Anesthesia Evaluation  Patient identified by MRN, date of birth, ID band Patient awake    Reviewed: Allergy & Precautions, NPO status , Patient's Chart, lab work & pertinent test results  History of Anesthesia Complications (+) PSEUDOCHOLINESTERASE DEFICIENCY  Airway Mallampati: II  TM Distance: >3 FB     Dental   Pulmonary shortness of breath, former smoker,    breath sounds clear to auscultation       Cardiovascular hypertension, + Peripheral Vascular Disease   Rhythm:Regular Rate:Normal     Neuro/Psych    GI/Hepatic Neg liver ROS, GERD  ,  Endo/Other    Renal/GU Renal disease     Musculoskeletal   Abdominal   Peds  Hematology   Anesthesia Other Findings   Reproductive/Obstetrics                             Anesthesia Physical Anesthesia Plan  ASA: III  Anesthesia Plan: MAC   Post-op Pain Management:    Induction: Intravenous  PONV Risk Score and Plan: 2 and Treatment may vary due to age or medical condition  Airway Management Planned: Simple Face Mask and Nasal Cannula  Additional Equipment:   Intra-op Plan:   Post-operative Plan:   Informed Consent: I have reviewed the patients History and Physical, chart, labs and discussed the procedure including the risks, benefits and alternatives for the proposed anesthesia with the patient or authorized representative who has indicated his/her understanding and acceptance.   Dental advisory given  Plan Discussed with: CRNA and Anesthesiologist  Anesthesia Plan Comments:         Anesthesia Quick Evaluation

## 2017-12-26 ENCOUNTER — Encounter (HOSPITAL_COMMUNITY): Payer: Self-pay | Admitting: Otolaryngology

## 2017-12-28 ENCOUNTER — Ambulatory Visit: Payer: Medicaid Other | Admitting: Internal Medicine

## 2017-12-28 LAB — PTH-RELATED PEPTIDE: PTH-related peptide: <2 pmol/L

## 2017-12-29 ENCOUNTER — Other Ambulatory Visit (HOSPITAL_COMMUNITY): Payer: Self-pay | Admitting: Otolaryngology

## 2017-12-29 DIAGNOSIS — J9859 Other diseases of mediastinum, not elsewhere classified: Secondary | ICD-10-CM

## 2017-12-29 DIAGNOSIS — C801 Malignant (primary) neoplasm, unspecified: Secondary | ICD-10-CM

## 2017-12-30 ENCOUNTER — Encounter: Payer: Self-pay | Admitting: Radiation Oncology

## 2018-01-01 ENCOUNTER — Telehealth: Payer: Self-pay | Admitting: *Deleted

## 2018-01-01 NOTE — Progress Notes (Signed)
Head and Neck Cancer Location of Tumor / Histology:  12/25/17 Diagnosis Soft tissue mass, biopsy, Right Neck - POORLY DIFFERENTIATED CARCINOMA.  Patient presented months ago with symptoms of: Faith [name] presented to Dr. Blenda Nicely on 11/26/17 after being referred by her PCP for right neck mass, that had been present for several months. She reported pain and difficulty turning her neck to the right. She also reported difficulty swallowing.   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   Nutrition Status Yes No Comments  Weight changes? [x]  []  She reports gaining about 6-10 lbs recently.   Swallowing concerns? [x]  []  She is eating softer foods. She has difficulty chewing meat  PEG? []  [x]     Referrals Yes No Comments  Social Work? []  [x]    Dentistry? []  [x]    Swallowing therapy? []  [x]    Nutrition? []  [x]    Med/Onc? [x]  []  Dr. Earlie Server 01/04/18   Safety Issues Yes No Comments  Prior radiation? []  [x]    Pacemaker/ICD? []  [x]    Possible current pregnancy? []  [x]    Is the patient on methotrexate? []  [x]     Tobacco/Marijuana/Snuff/ETOH use: She is a former smoker quitting 12/18. She does not drink alcohol.    Past/Anticipated interventions by otolaryngology, if any:  12/25/17 PROCEDURE(S): Incisional biopsy of right neck mass SURGEON: Gavin Pound, MD   Past/Anticipated interventions by medical oncology, if any:  01/04/18 Dr. Earlie Server.    Current Complaints / other details:  01/09/18 PET scheduled  BP 138/62 (BP Location: Right Arm, Patient Position: Sitting, Cuff Size: Normal)   Pulse 80   Temp 98.6 F (37 C) (Oral)   Resp 18   Ht 5\' 1"  (1.549 m)   Wt 136 lb 9.6 oz (62 kg)   SpO2 100%   BMI 25.81 kg/m    Wt Readings from Last 3 Encounters:  01/04/18 136 lb 9.6 oz (62 kg)  12/25/17 138 lb 9.6 oz (62.9 kg)  12/22/17 129 lb 14.4 oz (58.9 kg)

## 2018-01-04 ENCOUNTER — Encounter: Payer: Self-pay | Admitting: *Deleted

## 2018-01-04 ENCOUNTER — Encounter: Payer: Self-pay | Admitting: Internal Medicine

## 2018-01-04 ENCOUNTER — Encounter: Payer: Self-pay | Admitting: Radiation Oncology

## 2018-01-04 ENCOUNTER — Ambulatory Visit
Admission: RE | Admit: 2018-01-04 | Discharge: 2018-01-04 | Disposition: A | Discharge status: Home / Self Care | Payer: Medicare Other | Source: Ambulatory Visit | Attending: Radiation Oncology | Admitting: Radiation Oncology

## 2018-01-04 ENCOUNTER — Inpatient Hospital Stay: Payer: Medicare Other | Attending: Internal Medicine | Admitting: Internal Medicine

## 2018-01-04 VITALS — BP 138/62 | HR 80 | Temp 98.6°F | Resp 18 | Ht 61.0 in | Wt 136.6 lb

## 2018-01-04 DIAGNOSIS — Z79899 Other long term (current) drug therapy: Secondary | ICD-10-CM | POA: Diagnosis not present

## 2018-01-04 DIAGNOSIS — C778 Secondary and unspecified malignant neoplasm of lymph nodes of multiple regions: Secondary | ICD-10-CM | POA: Insufficient documentation

## 2018-01-04 DIAGNOSIS — R221 Localized swelling, mass and lump, neck: Secondary | ICD-10-CM

## 2018-01-04 DIAGNOSIS — R109 Unspecified abdominal pain: Secondary | ICD-10-CM | POA: Insufficient documentation

## 2018-01-04 DIAGNOSIS — R1310 Dysphagia, unspecified: Secondary | ICD-10-CM | POA: Diagnosis not present

## 2018-01-04 DIAGNOSIS — I493 Ventricular premature depolarization: Secondary | ICD-10-CM | POA: Diagnosis not present

## 2018-01-04 DIAGNOSIS — I739 Peripheral vascular disease, unspecified: Secondary | ICD-10-CM

## 2018-01-04 DIAGNOSIS — C770 Secondary and unspecified malignant neoplasm of lymph nodes of head, face and neck: Secondary | ICD-10-CM | POA: Insufficient documentation

## 2018-01-04 DIAGNOSIS — Z8673 Personal history of transient ischemic attack (TIA), and cerebral infarction without residual deficits: Secondary | ICD-10-CM | POA: Diagnosis not present

## 2018-01-04 DIAGNOSIS — E7800 Pure hypercholesterolemia, unspecified: Secondary | ICD-10-CM | POA: Insufficient documentation

## 2018-01-04 DIAGNOSIS — C760 Malignant neoplasm of head, face and neck: Secondary | ICD-10-CM | POA: Insufficient documentation

## 2018-01-04 DIAGNOSIS — Z7982 Long term (current) use of aspirin: Secondary | ICD-10-CM | POA: Diagnosis not present

## 2018-01-04 DIAGNOSIS — K219 Gastro-esophageal reflux disease without esophagitis: Secondary | ICD-10-CM | POA: Diagnosis not present

## 2018-01-04 DIAGNOSIS — Z809 Family history of malignant neoplasm, unspecified: Secondary | ICD-10-CM | POA: Insufficient documentation

## 2018-01-04 DIAGNOSIS — M25511 Pain in right shoulder: Secondary | ICD-10-CM | POA: Insufficient documentation

## 2018-01-04 DIAGNOSIS — F209 Schizophrenia, unspecified: Secondary | ICD-10-CM | POA: Diagnosis not present

## 2018-01-04 DIAGNOSIS — I10 Essential (primary) hypertension: Secondary | ICD-10-CM | POA: Diagnosis not present

## 2018-01-04 DIAGNOSIS — F419 Anxiety disorder, unspecified: Secondary | ICD-10-CM | POA: Insufficient documentation

## 2018-01-04 DIAGNOSIS — H9191 Unspecified hearing loss, right ear: Secondary | ICD-10-CM

## 2018-01-04 DIAGNOSIS — M542 Cervicalgia: Secondary | ICD-10-CM

## 2018-01-04 DIAGNOSIS — C801 Malignant (primary) neoplasm, unspecified: Secondary | ICD-10-CM | POA: Diagnosis not present

## 2018-01-04 DIAGNOSIS — Z87891 Personal history of nicotine dependence: Secondary | ICD-10-CM

## 2018-01-04 DIAGNOSIS — K5900 Constipation, unspecified: Secondary | ICD-10-CM | POA: Insufficient documentation

## 2018-01-04 DIAGNOSIS — B370 Candidal stomatitis: Secondary | ICD-10-CM | POA: Diagnosis not present

## 2018-01-04 DIAGNOSIS — R918 Other nonspecific abnormal finding of lung field: Secondary | ICD-10-CM | POA: Insufficient documentation

## 2018-01-04 DIAGNOSIS — F319 Bipolar disorder, unspecified: Secondary | ICD-10-CM | POA: Diagnosis not present

## 2018-01-04 DIAGNOSIS — R609 Edema, unspecified: Secondary | ICD-10-CM | POA: Diagnosis not present

## 2018-01-04 MED ORDER — FLUCONAZOLE 100 MG PO TABS: ORAL_TABLET | ORAL | 0 refills | Status: DC

## 2018-01-04 NOTE — Progress Notes (Signed)
[place] [phone]   [phone]  CONSULT NOTE  REFERRING PHYSICIAN: Dr. Lind Guest  REASON FOR CONSULTATION:  67 years old African-American female recently diagnosed with poorly differentiated carcinoma of unknown primary.  HPI Faith [name] is a 67 y.o. female with past medical history significant for hypertension, stroke, GERD, depression, osteoarthritis, and anxiety/depression, schizophrenia, dyslipidemia as well as long history for smoking.  The patient mentioned that since October 2018 she noticed swelling in the right side of the neck that has been getting bigger over the last few months.  Of note she had CT scan of the neck and chest on [name] 17, 2018 that showed moderate mediastinal adenopathy with the largest lymph node in the right peritracheal region measuring 2.4 cm by short axis.  There was also adenopathy over the right neck with necrotic node over the right cervical change seen on the CT scan of the neck at that time.  This was likely representing a neoplastic process and tissue sampling was recommended.  She had MRI of the brain performed at that time that was unremarkable for metastatic disease but showed scattered punctate acute infarctions in the left MCA territory suspicious for microembolic infarctions or watershed infarctions.  I am not sure about the delay of her workup since that time.  Because of the enlarging right neck mass, the patient had CT of the head and neck performed on November 26, 2017 and that showed progression of the upper mediastinal and supraclavicular adenopathy.  A large mass/adenopathy was seen in the right lateral neck (level 5B).  This was new or increased in size compared to the prior CT.  The patient was referred to Dr. Blenda Nicely.  She initially underwent fine-needle aspiration of the right neck mass that was nonconclusive.  She was then underwent excisional biopsy of the right neck mass and according to  Dr. Trish Mage note, this was consistent with poorly differentiated carcinoma.  Unfortunately I do not have the pathology report available to me at this point. The patient was referred to me today for recommendation regarding her condition.  She was also seen earlier today by Dr. Isidore Moos from radiation oncology. 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  She also has a swelling of the lower extremities secondary to peripheral vascular disease and she is followed by Dr. Donzetta Matters.  The patient denied having any chest pain but has shortness of breath with exertion with mild cough and no hemoptysis.  She denied having any recent weight loss or night sweats.  She has no nausea, vomiting, diarrhea or constipation.  She denied having any headache or visual changes. Family history significant for mother with colon cancer and father also had cancer of unknown type. The patient is separated and has one daughter, Faith [name].  She has a history for smoking up to 2 pack/day for around 50 years and quit recently.  She has no history of alcohol or drug abuse.  HPI  Past Medical History:  Diagnosis Date  . Anxiety   . Arthritis    "thighs; legs; hips" (07/05/2014)  . Bipolar disorder ([place])   . Cholelithiasis 07/2014  . Chronic bronchitis ([place])    "get it q yr"  . Chronic lower back pain   . Depression    pt. use to go to depression clinic, states she still has depression & anxiety but  can't get to appt. so she hasn't had any med. for it in a while   . Dyspnea   . GERD (gastroesophageal reflux disease)   . High cholesterol   . Hypertension   . Mass in neck 12/2017   RIGHT SIDE OF NECK  . Peripheral vascular disease ([place])   . Schizophrenia ([place])   . Stroke [facility])     Past Surgical History:  Procedure Laterality Date  . ABDOMINAL AORTOGRAM N/A 01/07/2017   Procedure: Abdominal Aortogram;  Surgeon: Waynetta Sandy, MD;  Location: [place] [facility];  Service: Cardiovascular;  Laterality: N/A;  . ABDOMINAL HYSTERECTOMY    . ANKLE FRACTURE SURGERY Right   . ANKLE HARDWARE REMOVAL Right   . APPENDECTOMY  ~ 1963  . BREAST BIOPSY Bilateral   . BREAST CYST EXCISION Bilateral    "not cancer"  . CATARACT EXTRACTION W/ INTRAOCULAR LENS  IMPLANT, BILATERAL    . CHOLECYSTECTOMY N/A 07/05/2014   Procedure: LAPAROSCOPIC CHOLECYSTECTOMY WITH INTRAOPERATIVE CHOLANGIOGRAM;  Surgeon: Gayland Curry, MD;  Location: [place];  Service: General;  Laterality: N/A;  . EXCISIONAL HEMORRHOIDECTOMY    . FEMORAL-POPLITEAL BYPASS GRAFT Right 01/15/2017   Procedure: BYPASS GRAFT FEMORAL-POPLITEAL ARTERY RIGHT LEG;  Surgeon: Waynetta Sandy, MD;  Location: [place];  Service: Vascular;  Laterality: Right;  . LAPAROSCOPIC CHOLECYSTECTOMY  07/05/2014  . LOWER EXTREMITY ANGIOGRAPHY Bilateral 01/07/2017   Procedure: Lower Extremity Angiography;  Surgeon: Waynetta Sandy, MD;  Location: [place] [facility];  Service: Cardiovascular;  Laterality: Bilateral;  . MASS BIOPSY Right 12/25/2017   Procedure: INCISIONAL RIGHT NECK MASS BIOPSY;  Surgeon: Helayne Seminole, MD;  Location: [place];  Service: ENT;  Laterality: Right;  . MULTIPLE TOOTH EXTRACTIONS  05/2013   "pulled my upper teeth"  . PILONIDAL CYST EXCISION      Family History  Problem Relation Age of Onset  . Cancer Mother 95       colon  . Diabetes Mother   . Hypertension Mother   . Cancer Father   . Diabetes Father   . Hypertension Father     Social History Social History   Tobacco Use  . Smoking status: Former Smoker    Packs/day: 1.00    Years: 48.00    Pack years: 48.00    Types: Cigarettes    Last attempt to quit: 11/30/2017    Years since quitting: 0.0  . Smokeless tobacco: Never Used  Substance Use Topics  . Alcohol use: No    Frequency: Never  . Drug use: No    Comment: 15-20 yrs. ago- used marijuana     Allergies    Allergen Reactions  . Penicillins Itching and Other (See Comments)    Causes yeast infections  PATIENT HAS HAD A PCN REACTION WITH IMMEDIATE RASH, FACIAL/TONGUE/THROAT SWELLING, SOB, OR LIGHTHEADEDNESS WITH HYPOTENSION:  #  #  #  YES  #  #  #   Has patient had a PCN reaction causing severe rash involving mucus membranes or skin necrosis: Unk Has patient had a PCN reaction that required hospitalization: No Has patient had a PCN reaction occurring within the last 10 years: #  #  #  YES  #  #  #  If all of the above answers are "NO", then may proceed with Cephalosporin   . Codeine Itching and Other (See Comments)    And yeast infections  . Percocet [Oxycodone-Acetaminophen] Itching    Tolerates with Benadryl  Current Outpatient Medications  Medication Sig Dispense Refill  . albuterol (PROVENTIL HFA;VENTOLIN HFA) 108 (90 Base) MCG/ACT inhaler Inhale 1-2 puffs into the lungs every 6 (six) hours as needed for wheezing or shortness of breath.    . AMITIZA 24 MCG capsule Take 24 mcg by mouth 2 (two) times daily.  0  . aspirin EC 325 MG tablet Take 1 tablet (325 mg total) by mouth daily. 30 tablet 0  . atorvastatin (LIPITOR) 10 MG tablet take 1 tablet by mouth once daily 30 tablet 0  . clopidogrel (PLAVIX) 75 MG tablet Take 1 tablet (75 mg total) by mouth daily. 30 tablet 0  . diclofenac sodium (VOLTAREN) 1 % GEL Apply 2 g topically daily as needed (pain).     Marland Kitchen diphenhydrAMINE (BENADRYL) 25 MG tablet Take 2 tablets (50 mg total) by mouth at bedtime as needed for sleep. 20 tablet 0  . fluticasone (FLONASE) 50 MCG/ACT nasal spray Place 2 sprays into both nostrils daily.   0  . furosemide (LASIX) 20 MG tablet Take 20 mg by mouth daily as needed for fluid or edema.     . gabapentin (NEURONTIN) 300 MG capsule Take 300 mg by mouth 3 (three) times daily.     . Linaclotide (LINZESS) 290 MCG CAPS capsule Take 290-580 mcg by mouth daily as needed (constipation).     Marland Kitchen loratadine (CLARITIN) 10 MG tablet  Take 10 mg by mouth daily.    . methocarbamol (ROBAXIN) 750 MG tablet Take 750 mg by mouth 2 (two) times daily as needed for muscle spasms.     . mometasone (ELOCON) 0.1 % ointment Apply 1 application topically daily as needed (irritation).     Marland Kitchen omeprazole (PRILOSEC) 20 MG capsule Take 20 mg by mouth 2 (two) times daily before a meal.    . oxyCODONE-acetaminophen (PERCOCET) 10-325 MG tablet Take 1 tablet by mouth every 6 (six) hours as needed for pain. 30 tablet 0  . polyethylene glycol (MIRALAX / GLYCOLAX) packet Take 17 g by mouth daily. 14 each 0  . potassium chloride 20 MEQ/15ML (10%) SOLN Take 15 mLs by mouth daily.    . sodium bicarbonate 650 MG tablet Take 2 tablets (1,300 mg total) by mouth 2 (two) times daily. 60 tablet 0  . Vitamin D, Ergocalciferol, (DRISDOL) 50000 units CAPS capsule Take 50,000 Units by mouth every 7 (seven) days.    Marland Kitchen acetaminophen (TYLENOL) 325 MG tablet Take 2 tablets (650 mg total) by mouth every 6 (six) hours as needed for mild pain (or Fever >/= 101). (Patient not taking: Reported on 01/04/2018)    . albuterol (PROVENTIL) (2.5 MG/3ML) 0.083% nebulizer solution Take 2.5 mg by nebulization every 6 (six) hours as needed for wheezing or shortness of breath.    . fluconazole (DIFLUCAN) 100 MG tablet Take 2 tabs today, then 1 tab daily for 6 more days; Hold Atorvastatin while on this. (Patient not taking: Reported on 01/04/2018) 8 tablet 0   No current facility-administered medications for this visit.     Review of Systems  Constitutional: positive for fatigue Eyes: negative Ears, nose, mouth, throat, and face: negative Respiratory: positive for cough and dyspnea on exertion Cardiovascular: negative Gastrointestinal: negative Genitourinary:negative Integument/breast: negative Hematologic/lymphatic: negative Musculoskeletal:negative Neurological: negative Behavioral/Psych: negative Endocrine: negative Allergic/Immunologic: negative  Physical  Exam  NID:POEUM, healthy, no distress, well nourished and well developed SKIN: skin color, texture, turgor are normal, no rashes or significant lesions HEAD: Normocephalic, No masses, lesions, tenderness or abnormalities EYES: normal,  PERRLA, Conjunctiva are pink and non-injected EARS: External ears normal, Canals clear OROPHARYNX:no exudate, no erythema and lips, buccal mucosa, and tongue normal  NECK: supple, no adenopathy, no JVD LYMPH:  no palpable lymphadenopathy, no hepatosplenomegaly BREAST:not examined LUNGS: clear to auscultation , and palpation HEART: regular rate & rhythm, no murmurs and no gallops ABDOMEN:abdomen soft, non-tender and normal bowel sounds BACK: Back symmetric, no curvature., No CVA tenderness EXTREMITIES:no joint deformities, effusion, or inflammation, no edema, no skin discoloration  NEURO: alert & oriented x 3 with fluent speech, no focal motor/sensory deficits  PERFORMANCE STATUS: ECOG 1  LABORATORY DATA: Lab Results  Component Value Date   WBC 9.0 12/25/2017   HGB 9.9 (L) 12/25/2017   HCT 28.8 (L) 12/25/2017   MCV 84.7 12/25/2017   PLT 152 12/25/2017      Chemistry      Component Value Date/Time   NA 143 12/25/2017 0611   K 4.5 12/25/2017 0611   CL 123 (H) 12/25/2017 0611   CO2 15 (L) 12/25/2017 0611   BUN 9 12/25/2017 0611   CREATININE 0.90 12/25/2017 0611      Component Value Date/Time   CALCIUM 10.4 (H) 12/25/2017 0611   ALKPHOS 78 12/23/2017 1358   AST 17 12/23/2017 1358   ALT 9 (L) 12/23/2017 1358   BILITOT 0.7 12/23/2017 1358       RADIOGRAPHIC STUDIES: No results found.  ASSESSMENT: This is a very pleasant 67 years old African-American female presented with right neck as well as mediastinal lymphadenopathy with the diagnosis of poorly differentiated carcinoma of unknown primary.  PLAN: I had a lengthy discussion with the patient and her daughter today about her current disease stage, prognosis and treatment options.  I  personally and independently reviewed the previous scan images. I will get the biopsy results from Dr. Dimple Nanas office. I also recommended for the patient to complete the staging workup by ordering MRI of the brain in addition to the previously scheduled PET scan. I would see the patient back for follow-up visit next week for reevaluation and more detailed discussion of her treatment options based on the final staging workup. She was seen by Dr. Isidore Moos from radiation oncology and definitely she may benefit from a course of palliative or even curative radiotherapy depending on the final staging workup. She was advised to call immediately if she has any concerning symptoms in the interval. The patient voices understanding of current disease status and treatment options and is in agreement with the current care plan.  All questions were answered. The patient knows to call the clinic with any problems, questions or concerns. We can certainly see the patient much sooner if necessary.  Thank you so much for allowing me to participate in the care of Faith [name]. I will continue to follow up the patient with you and assist in her care.  I spent 40 minutes counseling the patient face to face. The total time spent in the appointment was 60 minutes.  Disclaimer: This note was dictated with voice recognition software. Similar sounding words can inadvertently be transcribed and may not be corrected upon review.   Eilleen Kempf January 04, 2018, 12:08 PM

## 2018-01-04 NOTE — Telephone Encounter (Addendum)
Oncology Nurse Navigator Documentation  Spoke with pt dtr to coordinate earlier appt with Dr. Isidore Moos than previously scheduled 2/22.  I explained Dr. Blenda Nicely noted urgency when RadOnc referral placed.  We recognized that while results of PET scheduled for 2/9 needed to fully understand extent of her mother's disease and susequent tmt plan, better to consult with Dr. Isidore Moos ASAP.  She agreed, appt arranged for 2/4 7:30 NE followed by 8:00 consult with Dr. Isidore Moos.  She noted appt with Dr. Earlie Server same day at 11:30.  Gayleen Orem, RN, BSN Head & Neck Oncology Nurse [place] at [facility] [phone]

## 2018-01-04 NOTE — Progress Notes (Signed)
[place] Psychosocial Distress Screening Clinical Social Work  Clinical Social Work was referred by distress screening protocol.  The patient scored a 6 on the Psychosocial Distress Thermometer which indicates moderate distress. Clinical Social Worker contacted patient at home to assess for distress and other psychosocial needs.  CSW spoke with patient and patients daughter.  Patient stated she was doing "ok", and had no needs or concerns.  CSW informed patient of the support team and support services at [facility].  CSW also spoke with patients daughter, and provided education on CSW role.  Patients daughter had questions regarding home care.  Patient has medicaid benefits which could provide home care services.  CSW and patients daughter discussed PCS services.  Patients daughter stated they had discussed PCS with patients PCP, but did not have any updates on application status.  Patients daughter plans to contact patients PCP, and will contact CSW if additional assistance is needed.  CSW provided contact information and encouraged patient and family to call with questions or concerns.        ONCBCN DISTRESS SCREENING 01/04/2018  Screening Type Initial Screening  Distress experienced in past week (1-10) 6  Emotional problem type Nervousness/Anxiety  Information Concerns Type Lack of info about diagnosis;Lack of info about treatment;Lack of info about complementary therapy choices  Physical Problem type Pain;Nausea/vomiting;Talking;Constipation/diarrhea;Skin dry/itchy;Swollen arms/legs    Johnnye Lana, MSW, LCSW, OSW-C Clinical Social Worker [place] [phone]

## 2018-01-04 NOTE — Progress Notes (Addendum)
Radiation Oncology         [phone] ________________________________  Initial outpatient Consultation  Name: Faith [name] MRN: [id number]  Date: 01/04/2018  DOB: 1951/02/15  [id number], Christean Grief, MD  Helayne Seminole, MD   REFERRING PHYSICIAN: Helayne Seminole, MD  DIAGNOSIS:    ICD-10-CM   1. Mass of right side of neck R22.1 Ambulatory referral to Social Work  2. Candidiasis, mouth B37.0 fluconazole (DIFLUCAN) 100 MG tablet  3. 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 female who presented with mediastinal masses during an inpatient stay, on CT imaging of the chest.  This was in March 2018.  Upon discharge, according to documentation, the patient and her family were told that further workup was indicated but it does not appear that further workup was performed.    Months later the patient noted a growing painful right neck mass.  CT of the head and neck in December 2018 showed adenopathy in the supraclavicular region and the upper mediastinum  Subsequently, the patient saw Dr. Blenda Nicely who performed a biopsy of the right neck mass on 12/25/2017 revealing poorly differentiated carcinoma.  Per pathology report, "Immunohistochemistry shows the tumor is strongly positive with cytokeratin 5/6, cytokeratin 903, and p63. The tumor is negative with p16 (HPV), thyroid transcription factor-1, Napsin A and CDX-2. 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  She sees Dr. Earlie Server today in medical oncology.  Swallowing issues, if any:   She is eating softer foods but has difficulty chewing meat.  She does report some difficulty swallowing.  Weight Changes: The patient reports that  she is gained 6-10 pounds recently.  Pain status: She reports pain in her right neck extending to her right head.  Other symptoms: She reports urine ear numbness on the right side, right hearing loss, prior stroke which has affected function on the right side of her body.  She also reports heaviness in her right shoulder upon growth of the right neck mass/right arm weakness.  She has right throat pain and occasional shortness of breath.  She is coughing up some phlegm but denies hemoptysis.  She takes medication for constipation she has abdominal pain that comes and goes.  This is in the central abdominal region.  She has chronic edema of the right foot that waxes and wanes related to a prior fracture.  Tobacco history, if any: She reports that she quit smoking in December 2018  ETOH abuse, if any: Denies  She lives alone in [place].  The patient has schizophrenia.  She is accompanied by her sister today  PREVIOUS RADIATION THERAPY: No  PAST MEDICAL HISTORY:  has a past medical history of Anxiety, Arthritis, Bipolar disorder ([place]), Cholelithiasis (07/2014), Chronic bronchitis ([place]), Chronic lower back pain, Depression, Dyspnea, GERD (gastroesophageal reflux disease), High cholesterol, Hypertension, Mass in neck (12/2017), Peripheral vascular disease ([place]), Schizophrenia ([place]), and Stroke ([place]).    PAST SURGICAL HISTORY: Past Surgical History:  Procedure Laterality Date  . ABDOMINAL AORTOGRAM N/A 01/07/2017   Procedure: Abdominal Aortogram;  Surgeon: Waynetta Sandy, MD;  Location: [facility];  Service: Cardiovascular;  Laterality: N/A;  . ABDOMINAL HYSTERECTOMY    . ANKLE FRACTURE SURGERY Right   .  ANKLE HARDWARE REMOVAL Right   . APPENDECTOMY  ~ 1963  . BREAST BIOPSY Bilateral   . BREAST CYST EXCISION Bilateral    "not cancer"  . CATARACT EXTRACTION W/ INTRAOCULAR LENS  IMPLANT, BILATERAL    . CHOLECYSTECTOMY N/A 07/05/2014   Procedure: LAPAROSCOPIC CHOLECYSTECTOMY WITH  INTRAOPERATIVE CHOLANGIOGRAM;  Surgeon: Gayland Curry, MD;  Location: [facility];  Service: General;  Laterality: N/A;  . EXCISIONAL HEMORRHOIDECTOMY    . FEMORAL-POPLITEAL BYPASS GRAFT Right 01/15/2017   Procedure: BYPASS GRAFT FEMORAL-POPLITEAL ARTERY RIGHT LEG;  Surgeon: Waynetta Sandy, MD;  Location: [facility];  Service: Vascular;  Laterality: Right;  . LAPAROSCOPIC CHOLECYSTECTOMY  07/05/2014  . LOWER EXTREMITY ANGIOGRAPHY Bilateral 01/07/2017   Procedure: Lower Extremity Angiography;  Surgeon: Waynetta Sandy, MD;  Location: [facility];  Service: Cardiovascular;  Laterality: Bilateral;  . MASS BIOPSY Right 12/25/2017   Procedure: INCISIONAL RIGHT NECK MASS BIOPSY;  Surgeon: Helayne Seminole, MD;  Location: [facility];  Service: ENT;  Laterality: Right;  . MULTIPLE TOOTH EXTRACTIONS  05/2013   "pulled my upper teeth"  . PILONIDAL CYST EXCISION      FAMILY HISTORY: family history includes Cancer in her father; Cancer (age of onset: 65) in her mother; Diabetes in her father and mother; Hypertension in her father and mother.  SOCIAL HISTORY:  reports that she quit smoking about 5 weeks ago. Her smoking use included cigarettes. She has a 48.00 pack-year smoking history. she has never used smokeless tobacco. She reports that she does not drink alcohol or use drugs.  ALLERGIES: Penicillins; Codeine; and Percocet [oxycodone-acetaminophen]  MEDICATIONS:  Current Outpatient Medications  Medication Sig Dispense Refill  . acetaminophen (TYLENOL) 325 MG tablet Take 2 tablets (650 mg total) by mouth every 6 (six) hours as needed for mild pain (or Fever >/= 101). (Patient not taking: Reported on 01/04/2018)    . albuterol (PROVENTIL HFA;VENTOLIN HFA) 108 (90 Base) MCG/ACT inhaler Inhale 1-2 puffs into the lungs every 6 (six) hours as needed for wheezing or shortness of breath.    Marland Kitchen albuterol (PROVENTIL) (2.5 MG/3ML) 0.083% nebulizer solution Take 2.5 mg by nebulization every 6 (six) hours as  needed for wheezing or shortness of breath.    . AMITIZA 24 MCG capsule Take 24 mcg by mouth 2 (two) times daily.  0  . aspirin EC 325 MG tablet Take 1 tablet (325 mg total) by mouth daily. 30 tablet 0  . atorvastatin (LIPITOR) 10 MG tablet take 1 tablet by mouth once daily 30 tablet 0  . clopidogrel (PLAVIX) 75 MG tablet Take 1 tablet (75 mg total) by mouth daily. 30 tablet 0  . diclofenac sodium (VOLTAREN) 1 % GEL Apply 2 g topically daily as needed (pain).     Marland Kitchen diphenhydrAMINE (BENADRYL) 25 MG tablet Take 2 tablets (50 mg total) by mouth at bedtime as needed for sleep. 20 tablet 0  . fluticasone (FLONASE) 50 MCG/ACT nasal spray Place 2 sprays into both nostrils daily.   0  . furosemide (LASIX) 20 MG tablet Take 20 mg by mouth daily as needed for fluid or edema.     . gabapentin (NEURONTIN) 300 MG capsule Take 300 mg by mouth 3 (three) times daily.     . Linaclotide (LINZESS) 290 MCG CAPS capsule Take 290-580 mcg by mouth daily as needed (constipation).     Marland Kitchen loratadine (CLARITIN) 10 MG tablet Take 10 mg by mouth daily.    . methocarbamol (ROBAXIN) 750 MG tablet Take  750 mg by mouth 2 (two) times daily as needed for muscle spasms.     . mometasone (ELOCON) 0.1 % ointment Apply 1 application topically daily as needed (irritation).     Marland Kitchen omeprazole (PRILOSEC) 20 MG capsule Take 20 mg by mouth 2 (two) times daily before a meal.    . oxyCODONE-acetaminophen (PERCOCET) 10-325 MG tablet Take 1 tablet by mouth every 6 (six) hours as needed for pain. 30 tablet 0  . polyethylene glycol (MIRALAX / GLYCOLAX) packet Take 17 g by mouth daily. 14 each 0  . potassium chloride 20 MEQ/15ML (10%) SOLN Take 15 mLs by mouth daily.    . sodium bicarbonate 650 MG tablet Take 2 tablets (1,300 mg total) by mouth 2 (two) times daily. 60 tablet 0  . Vitamin D, Ergocalciferol, (DRISDOL) 50000 units CAPS capsule Take 50,000 Units by mouth every 7 (seven) days.    . fluconazole (DIFLUCAN) 100 MG tablet Take 2 tabs today,  then 1 tab daily for 6 more days; Hold Atorvastatin while on this. (Patient not taking: Reported on 01/04/2018) 8 tablet 0   No current facility-administered medications for this encounter.     REVIEW OF SYSTEMS:  Notable for that above.   PHYSICAL EXAM:  height is 5\' 1"  (1.549 m) and weight is 136 lb 9.6 oz (62 kg). Her oral temperature is 98.6 F (37 C). Her blood pressure is 138/62 and her pulse is 80. Her respiration is 18 and oxygen saturation is 100%.   General: Alert and oriented, in no acute distress HEENT: Head is normocephalic.  Left eye deviates laterally. Oropharynx is notable for no lesions except thrush. Dentures removed.  Neck: Neck is notable for a protuberant mass in the right lower neck with evidence of skin invasion/crusting of the skin Heart: Regular in rate and rhythm with no murmurs, rubs, or gallops. Chest: Clear to auscultation bilaterally, with no rhonchi, wheezes, or rales. Abdomen: Soft, nontender, nondistended, with no rigidity or guarding. Extremities: No upper extremity edema Lymphatics: see Neck Exam Skin: Neck exam above with skin involvement by tumor  musculoskeletal: Slightly diminished right upper extremity and right lower extremity strength Neurologic: Tardive dyskinesia of the mouth.  Left eye deviates laterally. speech is fluent. Coordination is intact. Psychiatric: Pleasant to speak with, in no acute distress  ECOG = 2  0 - Asymptomatic (Fully active, able to carry on all predisease activities without restriction)  1 - Symptomatic but completely ambulatory (Restricted in physically strenuous activity but ambulatory and able to carry out work of a light or sedentary nature. For example, light housework, office work)  2 - Symptomatic, <50% in bed during the day (Ambulatory and capable of all self care but unable to carry out any work activities. Up and about more than 50% of waking hours)  3 - Symptomatic, >50% in bed, but not bedbound (Capable of only  limited self-care, confined to bed or chair 50% or more of waking hours)  4 - Bedbound (Completely disabled. Cannot carry on any self-care. Totally confined to bed or chair)  5 - Death   Eustace Pen MM, Creech RH, Tormey DC, et al. [phone]). "Toxicity and response criteria of the [facility]". [place] Oncol. 5 (6): 649-55   LABORATORY DATA:  Lab Results  Component Value Date   WBC 9.0 12/25/2017   HGB 9.9 (L) 12/25/2017   HCT 28.8 (L) 12/25/2017   MCV 84.7 12/25/2017   PLT 152 12/25/2017   CMP  Component Value Date/Time   NA 143 12/25/2017 0611   K 4.5 12/25/2017 0611   CL 123 (H) 12/25/2017 0611   CO2 15 (L) 12/25/2017 0611   GLUCOSE 83 12/25/2017 0611   BUN 9 12/25/2017 0611   CREATININE 0.90 12/25/2017 0611   CALCIUM 10.4 (H) 12/25/2017 0611   PROT 5.1 (L) 12/23/2017 1358   ALBUMIN 2.7 (L) 12/23/2017 1358   AST 17 12/23/2017 1358   ALT 9 (L) 12/23/2017 1358   ALKPHOS 78 12/23/2017 1358   BILITOT 0.7 12/23/2017 1358   GFRNONAA >60 12/25/2017 0611   GFRAA >60 12/25/2017 0611     No results found for: TSH     RADIOGRAPHY: As above I reviewed her images    IMPRESSION/PLAN:  This is a delightful patient with head and neck cancer. I may recommend radiotherapy for this patient.  I told her that we will need to wait on the images from her PET scan to determine the extent of her cancer.  Then I will confer with medical oncology on the best course of care.  She understands that treatment will likely involve systemic therapy, or a combination of systemic therapy and radiation, or radiation therapy alone  We discussed the potential risks, benefits, and side effects of radiotherapy to the neck and chest. We talked in detail about acute and late effects. We discussed that some of the most bothersome acute effects may be mucositis, dysgeusia, salivary changes, skin irritation, hair loss, dehydration, weight loss and fatigue. We talked about late effects which  include but are not necessarily limited to dysphagia, hypothyroidism, nerve injury, spinal cord injury, injury to lungs and/or heart, xerostomia, and neck edema. No guarantees of treatment were given. A consent form was signed and placed in the patient's medical record. The patient is enthusiastic about proceeding with treatment. I look forward to participating in the patient's care.    Simulation (treatment planning) will take place upon completion of workup and multidisciplinary discussion  We also discussed that the treatment of head and neck cancer is a multidisciplinary process to maximize treatment outcomes and quality of life. For this reasons the following referrals have been or will be made:    Medical oncology to discuss chemotherapy    Nutritionist for nutrition support during and after treatment.   Speech language pathology for swallowing and/or speech therapy. MBSS study.   Social work for social support.    Physical therapy due to risk of lymphedema in neck and deconditioning.   Baseline  TSH.   Thrush - fluconazole.  Hold statin while on this.  __________________________________________   Eppie Gibson, MD

## 2018-01-04 NOTE — Progress Notes (Signed)
Oncology Nurse Navigator Documentation  Met with patient during initial consult with Dr. Isidore Moos.  She was accompanied by her dtr. 1. Further introduced myself as her Navigator, explained my role as a member of the Care Team.   2. Provided New Patient Information packet, discussed contents:  Contact information for physician(s), myself, other members of the Care Team.    Advance Directive information ([place] blue pamphlet with LCSW contact info).  Pt and dtr denied having ADs, I provided [facility] AD document, encouraged future appt with LCSW to complete.  Fall Prevention Patient Safety Plan  Appointment Guideline  Financial Assistance Information sheet  [place] with highlight of [place]  SLP information sheet 3. Provided introductory explanation of radiation treatment including SIM planning and purpose of Aquaplast head and shoulder mask, showed them example.   4. Provided a tour of SIM and [place] areas, explained treatment and arrival procedures. 5. I encouraged them to contact me with questions/concerns as treatments/procedures begin.  6. I escorted them to lobby, reviewed registration procedure for appt with Dr. Earlie Server later this morning. They verbalized understanding of information provided.    Gayleen Orem, RN, BSN Head & Neck Oncology Nurse [place] at [place] [phone]

## 2018-01-05 ENCOUNTER — Telehealth: Payer: Self-pay | Admitting: Oncology

## 2018-01-05 ENCOUNTER — Encounter: Payer: Self-pay | Admitting: Radiation Oncology

## 2018-01-05 ENCOUNTER — Other Ambulatory Visit: Payer: Self-pay | Admitting: Radiation Oncology

## 2018-01-05 DIAGNOSIS — C770 Secondary and unspecified malignant neoplasm of lymph nodes of head, face and neck: Secondary | ICD-10-CM | POA: Insufficient documentation

## 2018-01-05 DIAGNOSIS — R635 Abnormal weight gain: Secondary | ICD-10-CM

## 2018-01-05 DIAGNOSIS — Z1329 Encounter for screening for other suspected endocrine disorder: Secondary | ICD-10-CM

## 2018-01-05 NOTE — Telephone Encounter (Signed)
Scheduled appt per 2/4 sch message - daughter is aware of nutritionist appt added.

## 2018-01-07 ENCOUNTER — Telehealth: Payer: Self-pay | Admitting: Internal Medicine

## 2018-01-07 NOTE — Telephone Encounter (Signed)
01/07/18 @ 11:51 am spoke with caregiver, Nalaysia Manganiello and confirmed FMLA was successfully faxed to [phone] @ 11:50 am.  She requested a copy be mailed to: [address] [place], [place] [address] for personal records.

## 2018-01-08 ENCOUNTER — Ambulatory Visit (INDEPENDENT_AMBULATORY_CARE_PROVIDER_SITE_OTHER)
Admission: RE | Admit: 2018-01-08 | Discharge: 2018-01-08 | Disposition: A | Discharge status: Home / Self Care | Payer: Medicare Other | Source: Ambulatory Visit | Attending: Vascular Surgery | Admitting: Vascular Surgery

## 2018-01-08 ENCOUNTER — Ambulatory Visit (INDEPENDENT_AMBULATORY_CARE_PROVIDER_SITE_OTHER): Payer: Medicare Other | Admitting: Family

## 2018-01-08 ENCOUNTER — Other Ambulatory Visit: Payer: Self-pay

## 2018-01-08 ENCOUNTER — Ambulatory Visit (HOSPITAL_COMMUNITY)
Admission: RE | Admit: 2018-01-08 | Discharge: 2018-01-08 | Disposition: A | Discharge status: Home / Self Care | Payer: Medicare Other | Source: Ambulatory Visit | Attending: Vascular Surgery | Admitting: Vascular Surgery

## 2018-01-08 ENCOUNTER — Other Ambulatory Visit (HOSPITAL_COMMUNITY): Payer: Self-pay | Admitting: Radiation Oncology

## 2018-01-08 ENCOUNTER — Encounter: Payer: Self-pay | Admitting: Family

## 2018-01-08 ENCOUNTER — Other Ambulatory Visit: Payer: Self-pay | Admitting: Vascular Surgery

## 2018-01-08 VITALS — BP 103/63 | HR 98 | Temp 98.3°F | Resp 14 | Ht 61.0 in | Wt 128.0 lb

## 2018-01-08 DIAGNOSIS — I739 Peripheral vascular disease, unspecified: Secondary | ICD-10-CM | POA: Diagnosis present

## 2018-01-08 DIAGNOSIS — Z95828 Presence of other vascular implants and grafts: Secondary | ICD-10-CM

## 2018-01-08 DIAGNOSIS — T82898A Other specified complication of vascular prosthetic devices, implants and grafts, initial encounter: Secondary | ICD-10-CM | POA: Diagnosis not present

## 2018-01-08 DIAGNOSIS — R1319 Other dysphagia: Secondary | ICD-10-CM

## 2018-01-08 DIAGNOSIS — Z87891 Personal history of nicotine dependence: Secondary | ICD-10-CM | POA: Diagnosis not present

## 2018-01-08 DIAGNOSIS — I70301 Unspecified atherosclerosis of unspecified type of bypass graft(s) of the extremities, right leg: Secondary | ICD-10-CM | POA: Diagnosis not present

## 2018-01-08 DIAGNOSIS — I779 Disorder of arteries and arterioles, unspecified: Secondary | ICD-10-CM

## 2018-01-08 NOTE — Progress Notes (Signed)
[facility] [place]   CC: Follow up peripheral artery occlusive disease  History of Present Illness Faith [name] is a 67 y.o. female with a history of right foot wound and underwent bypass femoral-popliteal with graft in February 2018 Unfortunately she had a stroke in March 2018, although mostly recovered. She continues on aspirin and Plavix.   She is walking with aid of a cane. Her wounds have healed. Her incisions have healed as well.   Dr. Donzetta Matters last evaluated pt on 05-22-17. At that time she had an ABI of 1 with a palpable pulse and healed her wounds since her surgery in February 2018. She had some residual swelling for which Dr. Donzetta Matters recommended compression stockings. She should continue aspirin and Plavix at that time and follow up in 6 months with repeat ABIs and duplex.  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 She is undergoing further evaluation to determine what type of carcinoma and the origin of the tumor.   Pt states after the neck bx right leg "blew up" with swelling.  She c/o pain in right ankle at rest, started a couple of weeks ago, feels better when walking.     Diabetic: No Tobacco use: former smoker, quit In January 2019, smoked x 48 years  Pt meds include: Statin :Yes Betablocker: No ASA: Yes Other anticoagulants/antiplatelets: Plavix was stopped for a week when she took an antifungal medication for oral candidiasis, she will resume after she finishes the antifungal in 2 days.   Past Medical History:  Diagnosis Date  . Anxiety   . Arthritis    "thighs; legs; hips" (07/05/2014)  . Bipolar disorder ([place])   . Cholelithiasis 07/2014  . Chronic bronchitis ([place])    "get it q yr"  . Chronic lower back pain   . Depression    pt. use to go to depression clinic, states she still has depression & anxiety but can't get to  appt. so she hasn't had any med. for it in a while   . Dyspnea   . GERD (gastroesophageal reflux disease)   . High cholesterol   . Hypertension   . Mass in neck 12/2017   RIGHT SIDE OF NECK  . Peripheral vascular disease ([place])   . Schizophrenia ([place])   . Stroke [facility])     Social History Social History   Tobacco Use  . Smoking status: Former Smoker    Packs/day: 1.00    Years: 48.00    Pack years: 48.00    Types: Cigarettes    Last attempt to quit: 11/30/2017    Years since quitting: 0.1  . Smokeless tobacco: Never Used  Substance Use Topics  . Alcohol use: No    Frequency: Never  . Drug use: No    Comment: 15-20 yrs. ago- used marijuana     Family History Family History  Problem Relation Age of Onset  . Cancer Mother 30       colon  . Diabetes Mother   . Hypertension Mother   . Cancer Father   . Diabetes Father   . Hypertension Father     Past Surgical History:  Procedure Laterality Date  . ABDOMINAL AORTOGRAM N/A 01/07/2017   Procedure: Abdominal Aortogram;  Surgeon: Waynetta Sandy, MD;  Location: [place] [facility];  Service: Cardiovascular;  Laterality: N/A;  . ABDOMINAL  HYSTERECTOMY    . ANKLE FRACTURE SURGERY Right   . ANKLE HARDWARE REMOVAL Right   . APPENDECTOMY  ~ 1963  . BREAST BIOPSY Bilateral   . BREAST CYST EXCISION Bilateral    "not cancer"  . CATARACT EXTRACTION W/ INTRAOCULAR LENS  IMPLANT, BILATERAL    . CHOLECYSTECTOMY N/A 07/05/2014   Procedure: LAPAROSCOPIC CHOLECYSTECTOMY WITH INTRAOPERATIVE CHOLANGIOGRAM;  Surgeon: Gayland Curry, MD;  Location: [place];  Service: General;  Laterality: N/A;  . EXCISIONAL HEMORRHOIDECTOMY    . FEMORAL-POPLITEAL BYPASS GRAFT Right 01/15/2017   Procedure: BYPASS GRAFT FEMORAL-POPLITEAL ARTERY RIGHT LEG;  Surgeon: Waynetta Sandy, MD;  Location: [place];  Service: Vascular;  Laterality: Right;  . LAPAROSCOPIC CHOLECYSTECTOMY  07/05/2014  . LOWER EXTREMITY ANGIOGRAPHY Bilateral 01/07/2017   Procedure:  Lower Extremity Angiography;  Surgeon: Waynetta Sandy, MD;  Location: [place] [facility];  Service: Cardiovascular;  Laterality: Bilateral;  . MASS BIOPSY Right 12/25/2017   Procedure: INCISIONAL RIGHT NECK MASS BIOPSY;  Surgeon: Helayne Seminole, MD;  Location: [place];  Service: ENT;  Laterality: Right;  . MULTIPLE TOOTH EXTRACTIONS  05/2013   "pulled my upper teeth"  . PILONIDAL CYST EXCISION      Allergies  Allergen Reactions  . Penicillins Itching and Other (See Comments)    Causes yeast infections  PATIENT HAS HAD A PCN REACTION WITH IMMEDIATE RASH, FACIAL/TONGUE/THROAT SWELLING, SOB, OR LIGHTHEADEDNESS WITH HYPOTENSION:  #  #  #  YES  #  #  #   Has patient had a PCN reaction causing severe rash involving mucus membranes or skin necrosis: Unk Has patient had a PCN reaction that required hospitalization: No Has patient had a PCN reaction occurring within the last 10 years: #  #  #  YES  #  #  #  If all of the above answers are "NO", then may proceed with Cephalosporin   . Codeine Itching and Other (See Comments)    And yeast infections  . Percocet [Oxycodone-Acetaminophen] Itching    Tolerates with Benadryl    Current Outpatient Medications  Medication Sig Dispense Refill  . acetaminophen (TYLENOL) 325 MG tablet Take 2 tablets (650 mg total) by mouth every 6 (six) hours as needed for mild pain (or Fever >/= 101). (Patient not taking: Reported on 01/04/2018)    . albuterol (PROVENTIL HFA;VENTOLIN HFA) 108 (90 Base) MCG/ACT inhaler Inhale 1-2 puffs into the lungs every 6 (six) hours as needed for wheezing or shortness of breath.    Marland Kitchen albuterol (PROVENTIL) (2.5 MG/3ML) 0.083% nebulizer solution Take 2.5 mg by nebulization every 6 (six) hours as needed for wheezing or shortness of breath.    . AMITIZA 24 MCG capsule Take 24 mcg by mouth 2 (two) times daily.  0  . aspirin EC 325 MG tablet Take 1 tablet (325 mg total) by mouth daily. 30 tablet 0  . atorvastatin (LIPITOR) 10 MG  tablet take 1 tablet by mouth once daily 30 tablet 0  . clopidogrel (PLAVIX) 75 MG tablet Take 1 tablet (75 mg total) by mouth daily. 30 tablet 0  . diclofenac sodium (VOLTAREN) 1 % GEL Apply 2 g topically daily as needed (pain).     Marland Kitchen diphenhydrAMINE (BENADRYL) 25 MG tablet Take 2 tablets (50 mg total) by mouth at bedtime as needed for sleep. 20 tablet 0  . fluconazole (DIFLUCAN) 100 MG tablet Take 2 tabs today, then 1 tab daily for 6 more days; Hold Atorvastatin while on this. (Patient not  taking: Reported on 01/04/2018) 8 tablet 0  . fluticasone (FLONASE) 50 MCG/ACT nasal spray Place 2 sprays into both nostrils daily.   0  . furosemide (LASIX) 20 MG tablet Take 20 mg by mouth daily as needed for fluid or edema.     . gabapentin (NEURONTIN) 300 MG capsule Take 300 mg by mouth 3 (three) times daily.     . Linaclotide (LINZESS) 290 MCG CAPS capsule Take 290-580 mcg by mouth daily as needed (constipation).     Marland Kitchen loratadine (CLARITIN) 10 MG tablet Take 10 mg by mouth daily.    . methocarbamol (ROBAXIN) 750 MG tablet Take 750 mg by mouth 2 (two) times daily as needed for muscle spasms.     . mometasone (ELOCON) 0.1 % ointment Apply 1 application topically daily as needed (irritation).     Marland Kitchen omeprazole (PRILOSEC) 20 MG capsule Take 20 mg by mouth 2 (two) times daily before a meal.    . oxyCODONE-acetaminophen (PERCOCET) 10-325 MG tablet Take 1 tablet by mouth every 6 (six) hours as needed for pain. 30 tablet 0  . polyethylene glycol (MIRALAX / GLYCOLAX) packet Take 17 g by mouth daily. 14 each 0  . potassium chloride 20 MEQ/15ML (10%) SOLN Take 15 mLs by mouth daily.    . sodium bicarbonate 650 MG tablet Take 2 tablets (1,300 mg total) by mouth 2 (two) times daily. 60 tablet 0  . Vitamin D, Ergocalciferol, (DRISDOL) 50000 units CAPS capsule Take 50,000 Units by mouth every 7 (seven) days.     No current facility-administered medications for this visit.     ROS: See HPI for pertinent positives and  negatives.   Physical Examination  Vitals:   01/08/18 1431  BP: 103/63  Pulse: 98  Resp: 14  Temp: 98.3 F (36.8 C)  SpO2: 96%  Weight: 128 lb (58.1 kg)  Height: 5\' 1"  (1.549 m)   Body mass index is 24.19 kg/m.  General: A&O x 3, WDWN, female. Gait: normal HENT: Raised lesion right side of neck, exophthalmos right eye.  Eyes: PERRLA. Pulmonary: Respirations are non labored, CTAB, good air movement Cardiac: regular rhythm, no detected murmur.         Carotid Bruits Right Left   Negative Negative   Radial pulses are 2+ palpable bilaterally   Adominal aortic pulse is not palpable                         VASCULAR EXAM: Extremities without ischemic changes, without Gangrene; without open wounds.                                                                                                          LE Pulses Right Left       FEMORAL  2+ palpable  2+ palpable        POPLITEAL  not palpable   not palpable       POSTERIOR TIBIAL  not palpable   2+ palpable        DORSALIS PEDIS  ANTERIOR TIBIAL not palpable  not palpable    Abdomen: soft, NT, no palpable masses. Skin: no rashes, no cellulitis, no ulcers noted. Musculoskeletal: no muscle wasting or atrophy.  Neurologic: A&O X 3; appropriate affect, Sensation is normal; MOTOR FUNCTION:  moving all extremities equally, motor strength 5/5 throughout. Speech is fluent/normal. CN 2-12 intact. Psychiatric: Thought content is normal, mood appropriate for clinical situation.     ASSESSMENT: Marylen A [name] is a 67 y.o. female who is s/p right femoral to above-knee popliteal artery bypass grafting with graft in February 2018.  She started having right ankle pain at rest a couple of weeks ago, improved with walking.   Arterial duplex of right leg demonstrates occlusion of the bypass graft.  ABI on the right declined to 53% from 100%, left ABI remains improved to 100%.   DATA  Right LE Arterial Duplex  (01/08/18): Occluded right leg bypass graft from proximal to distal anastomosis.  50-74% CFA inflow stenosis.   ABI (Date: 01/08/2018):  R:   ABI: 0.53 (was 1.0 on 05-22-17),   PT: mono  DP: mono  TBI:  0.25 (was 0.65)  L:   ABI: 1.08 (was 0.80),   PT: mono  DP: mono  TBI: 0.69 (was 0.42)  Significant decline in right ABI, improved left ABI to 100%. Decline in right TBI, improved left TBI.     Carotid Duplex (02-17-17): 1-39% right internal carotid artery stenosis. The left internal carotid artery appears to be patent at its origin, however significantly atypical waveforms suggest distal occlusion. Vertebral arteries are patent with antegrade flow.   PLAN:  Based on the patient's vascular studies and examination, and after discussing with Dr. Donzetta Matters, pt will return to clinic at Dr. Claretha Cooper soonest office available which is 01-13-18 and discuss options to address occluded right leg arterial bypass graft.   I discussed in depth with the patient the nature of atherosclerosis, and emphasized the importance of maximal medical management including strict control of blood pressure, blood glucose, and lipid levels, obtaining regular exercise, and continued cessation of smoking.  The patient is aware that without maximal medical management the underlying atherosclerotic disease process will progress, limiting the benefit of any interventions.  The patient was given information about PAD including signs, symptoms, treatment, what symptoms should prompt the patient to seek immediate medical care, and risk reduction measures to take.  Clemon Chambers, RN, MSN, FNP-C [facility] Phone: [phone]  Clinic MD: Donzetta Matters on call  01/08/18 2:46 PM

## 2018-01-08 NOTE — Patient Instructions (Signed)

## 2018-01-09 ENCOUNTER — Ambulatory Visit (HOSPITAL_COMMUNITY): Payer: Medicare Other

## 2018-01-09 ENCOUNTER — Encounter (HOSPITAL_COMMUNITY): Payer: Self-pay

## 2018-01-09 ENCOUNTER — Emergency Department (HOSPITAL_COMMUNITY)
Admission: EM | Admit: 2018-01-09 | Discharge: 2018-01-09 | Disposition: A | Discharge status: Home / Self Care | Payer: Medicare Other | Attending: Emergency Medicine | Admitting: Emergency Medicine

## 2018-01-09 DIAGNOSIS — R51 Headache: Secondary | ICD-10-CM | POA: Diagnosis present

## 2018-01-09 DIAGNOSIS — Z5321 Procedure and treatment not carried out due to patient leaving prior to being seen by health care provider: Secondary | ICD-10-CM | POA: Insufficient documentation

## 2018-01-09 NOTE — ED Triage Notes (Signed)
Pt recently biopsied for cancer.  Has skin lesion on neck.  Pt now with headache neck shoulder pain.  On same side.  No new injury.  No fever.

## 2018-01-12 ENCOUNTER — Ambulatory Visit: Payer: Medicare Other | Admitting: Oncology

## 2018-01-12 ENCOUNTER — Ambulatory Visit (HOSPITAL_COMMUNITY)
Admission: RE | Admit: 2018-01-12 | Discharge: 2018-01-12 | Disposition: A | Discharge status: Home / Self Care | Payer: Medicare Other | Source: Ambulatory Visit | Attending: Internal Medicine | Admitting: Internal Medicine

## 2018-01-12 ENCOUNTER — Ambulatory Visit: Payer: Medicare Other

## 2018-01-12 ENCOUNTER — Inpatient Hospital Stay: Payer: Medicare Other

## 2018-01-12 ENCOUNTER — Ambulatory Visit: Payer: Medicare Other | Admitting: Radiation Oncology

## 2018-01-12 DIAGNOSIS — C760 Malignant neoplasm of head, face and neck: Secondary | ICD-10-CM | POA: Insufficient documentation

## 2018-01-12 DIAGNOSIS — I6782 Cerebral ischemia: Secondary | ICD-10-CM | POA: Diagnosis not present

## 2018-01-12 MED ORDER — GADOBENATE DIMEGLUMINE 529 MG/ML IV SOLN
15.0000 mL | Freq: Once | INTRAVENOUS | Status: AC | PRN
  Administered 2018-01-12: 12 mL via INTRAVENOUS

## 2018-01-12 NOTE — Progress Notes (Signed)
Nutrition  Unable to see patient today for nutrition appointment (delay regarding labs).  Patient unable to stay as needing to get to MRI appointment this afternoon. Grand-daughter brought patient and RD spoke with daughter via phone and aware.    Nutrition appointment to be rescheduled.  Daughter reports can't see RD on 2/26 due to having another appointment in [facility].  RD to follow-up with Head and Neck Navigator regarding getting nutrition appointment rescheduled.    Milagro Belmares B. Zenia Resides, [place], [place] Registered Dietitian [phone] (pager)

## 2018-01-13 ENCOUNTER — Ambulatory Visit (INDEPENDENT_AMBULATORY_CARE_PROVIDER_SITE_OTHER): Payer: Medicare Other | Admitting: Vascular Surgery

## 2018-01-13 ENCOUNTER — Other Ambulatory Visit: Payer: Self-pay

## 2018-01-13 ENCOUNTER — Encounter: Payer: Self-pay | Admitting: Vascular Surgery

## 2018-01-13 VITALS — BP 111/73 | HR 97 | Temp 97.5°F | Resp 16 | Ht 61.0 in | Wt 131.0 lb

## 2018-01-13 DIAGNOSIS — I779 Disorder of arteries and arterioles, unspecified: Secondary | ICD-10-CM

## 2018-01-13 DIAGNOSIS — T82898A Other specified complication of vascular prosthetic devices, implants and grafts, initial encounter: Secondary | ICD-10-CM | POA: Diagnosis not present

## 2018-01-13 NOTE — Progress Notes (Signed)
Patient ID: Faith [name], female   DOB: 26-Jun-1951, 67 y.o.   MRN: [id number]  Reason for Consult: PAD (ASAP appt to discuss options regarding occluded R leg BPG. )   Referred by Faith Ebbs, MD  Subjective:     HPI:  Faith [name] is a 67 y.o. female with a history of right foot ulceration subsequently underwent right femoral to above-knee popliteal artery bypass.  Same time she had common femoral endarterectomy.  She subsequently healed her wounds.  Unfortunately postoperatively she had a stroke.  She is now been diagnosed with cancer of unknown origin is undergone week.  She is having some pain in her right leg even at rest worse with walking.  She does not have wounds at this time.  Overall she is most worried about her cancer at this time.  She is taking aspirin Plavix and a statin.  Past Medical History:  Diagnosis Date  . Anxiety   . Arthritis    "thighs; legs; hips" (07/05/2014)  . Bipolar disorder ([place])   . Cholelithiasis 07/2014  . Chronic bronchitis ([place])    "get it q yr"  . Chronic lower back pain   . Depression    pt. use to go to depression clinic, states she still has depression & anxiety but can't get to appt. so she hasn't had any med. for it in a while   . Dyspnea   . GERD (gastroesophageal reflux disease)   . High cholesterol   . Hypertension   . Mass in neck 12/2017   RIGHT SIDE OF NECK  . Peripheral vascular disease ([place])   . Schizophrenia ([place])   . Stroke [facility])    Family History  Problem Relation Age of Onset  . Cancer Mother 44       colon  . Diabetes Mother   . Hypertension Mother   . Cancer Father   . Diabetes Father   . Hypertension Father    Past Surgical History:  Procedure Laterality Date  . ABDOMINAL AORTOGRAM N/A 01/07/2017   Procedure: Abdominal Aortogram;  Surgeon: Waynetta Sandy, MD;  Location: [place] [facility];  Service: Cardiovascular;  Laterality: N/A;  . ABDOMINAL HYSTERECTOMY    . ANKLE FRACTURE SURGERY Right   .  ANKLE HARDWARE REMOVAL Right   . APPENDECTOMY  ~ 1963  . BREAST BIOPSY Bilateral   . BREAST CYST EXCISION Bilateral    "not cancer"  . CATARACT EXTRACTION W/ INTRAOCULAR LENS  IMPLANT, BILATERAL    . CHOLECYSTECTOMY N/A 07/05/2014   Procedure: LAPAROSCOPIC CHOLECYSTECTOMY WITH INTRAOPERATIVE CHOLANGIOGRAM;  Surgeon: Gayland Curry, MD;  Location: [place];  Service: General;  Laterality: N/A;  . EXCISIONAL HEMORRHOIDECTOMY    . FEMORAL-POPLITEAL BYPASS GRAFT Right 01/15/2017   Procedure: BYPASS GRAFT FEMORAL-POPLITEAL ARTERY RIGHT LEG;  Surgeon: Waynetta Sandy, MD;  Location: [place];  Service: Vascular;  Laterality: Right;  . LAPAROSCOPIC CHOLECYSTECTOMY  07/05/2014  . LOWER EXTREMITY ANGIOGRAPHY Bilateral 01/07/2017   Procedure: Lower Extremity Angiography;  Surgeon: Waynetta Sandy, MD;  Location: [place] [facility];  Service: Cardiovascular;  Laterality: Bilateral;  . MASS BIOPSY Right 12/25/2017   Procedure: INCISIONAL RIGHT NECK MASS BIOPSY;  Surgeon: Helayne Seminole, MD;  Location: [place];  Service: ENT;  Laterality: Right;  . MULTIPLE TOOTH EXTRACTIONS  05/2013   "pulled my upper teeth"  . PILONIDAL CYST EXCISION      Short Social History:  Social History   Tobacco Use  . Smoking  status: Current Some Day Smoker    Packs/day: 1.00    Years: 48.00    Pack years: 48.00    Types: Cigarettes    Last attempt to quit: 11/30/2017    Years since quitting: 0.1  . Smokeless tobacco: Never Used  . Tobacco comment: Occasional.   Substance Use Topics  . Alcohol use: No    Frequency: Never    Allergies  Allergen Reactions  . Penicillins Itching and Other (See Comments)    Causes yeast infections  PATIENT HAS HAD A PCN REACTION WITH IMMEDIATE RASH, FACIAL/TONGUE/THROAT SWELLING, SOB, OR LIGHTHEADEDNESS WITH HYPOTENSION:  #  #  #  YES  #  #  #   Has patient had a PCN reaction causing severe rash involving mucus membranes or skin necrosis: Unk Has patient had a PCN reaction  that required hospitalization: No Has patient had a PCN reaction occurring within the last 10 years: #  #  #  YES  #  #  #  If all of the above answers are "NO", then may proceed with Cephalosporin   . Codeine Itching and Other (See Comments)    And yeast infections  . Percocet [Oxycodone-Acetaminophen] Itching    Tolerates with Benadryl    Current Outpatient Medications  Medication Sig Dispense Refill  . acetaminophen (TYLENOL) 325 MG tablet Take 2 tablets (650 mg total) by mouth every 6 (six) hours as needed for mild pain (or Fever >/= 101).    Marland Kitchen albuterol (PROVENTIL HFA;VENTOLIN HFA) 108 (90 Base) MCG/ACT inhaler Inhale 1-2 puffs into the lungs every 6 (six) hours as needed for wheezing or shortness of breath.    Marland Kitchen albuterol (PROVENTIL) (2.5 MG/3ML) 0.083% nebulizer solution Take 2.5 mg by nebulization every 6 (six) hours as needed for wheezing or shortness of breath.    . AMITIZA 24 MCG capsule Take 24 mcg by mouth 2 (two) times daily.  0  . aspirin EC 325 MG tablet Take 1 tablet (325 mg total) by mouth daily. 30 tablet 0  . atorvastatin (LIPITOR) 10 MG tablet take 1 tablet by mouth once daily 30 tablet 0  . clopidogrel (PLAVIX) 75 MG tablet Take 1 tablet (75 mg total) by mouth daily. 30 tablet 0  . diclofenac sodium (VOLTAREN) 1 % GEL Apply 2 g topically daily as needed (pain).     Marland Kitchen diphenhydrAMINE (BENADRYL) 25 MG tablet Take 2 tablets (50 mg total) by mouth at bedtime as needed for sleep. 20 tablet 0  . fluconazole (DIFLUCAN) 100 MG tablet Take 2 tabs today, then 1 tab daily for 6 more days; Hold Atorvastatin while on this. 8 tablet 0  . fluticasone (FLONASE) 50 MCG/ACT nasal spray Place 2 sprays into both nostrils daily.   0  . furosemide (LASIX) 20 MG tablet Take 20 mg by mouth daily as needed for fluid or edema.     . gabapentin (NEURONTIN) 300 MG capsule Take 300 mg by mouth 3 (three) times daily.     . Linaclotide (LINZESS) 290 MCG CAPS capsule Take 290-580 mcg by mouth daily as  needed (constipation).     Marland Kitchen loratadine (CLARITIN) 10 MG tablet Take 10 mg by mouth daily.    . methocarbamol (ROBAXIN) 750 MG tablet Take 750 mg by mouth 2 (two) times daily as needed for muscle spasms.     . mometasone (ELOCON) 0.1 % ointment Apply 1 application topically daily as needed (irritation).     Marland Kitchen omeprazole (PRILOSEC) 20 MG capsule Take  20 mg by mouth 2 (two) times daily before a meal.    . oxyCODONE-acetaminophen (PERCOCET) 10-325 MG tablet Take 1 tablet by mouth every 6 (six) hours as needed for pain. 30 tablet 0  . polyethylene glycol (MIRALAX / GLYCOLAX) packet Take 17 g by mouth daily. 14 each 0  . potassium chloride 20 MEQ/15ML (10%) SOLN Take 15 mLs by mouth daily.    . sodium bicarbonate 650 MG tablet Take 2 tablets (1,300 mg total) by mouth 2 (two) times daily. 60 tablet 0  . Vitamin D, Ergocalciferol, (DRISDOL) 50000 units CAPS capsule Take 50,000 Units by mouth every 7 (seven) days.     No current facility-administered medications for this visit.     Review of Systems  Constitutional:  Constitutional negative. HENT: HENT negative.  Eyes: Eyes negative.  Respiratory: Respiratory negative.  Cardiovascular: Cardiovascular negative.  GI: Gastrointestinal negative.  Musculoskeletal: Musculoskeletal negative.  Skin: Skin negative.  Neurological: Neurological negative. Hematologic: Hematologic/lymphatic negative.  Psychiatric: Psychiatric negative.        Objective:  Objective   Vitals:   01/13/18 1435  BP: 111/73  Pulse: 97  Resp: 16  Temp: (!) 97.5 F (36.4 C)  TempSrc: Oral  SpO2: 97%  Weight: 131 lb (59.4 kg)  Height: 5\' 1"  (1.549 m)   Body mass index is 24.75 kg/m.  Physical Exam  Constitutional: She is oriented to person, place, and time. She appears well-developed.  HENT:  Head: Normocephalic.  Right neck mass  Eyes: Pupils are equal, round, and reactive to light.  Neck: Normal range of motion.  Cardiovascular: Normal rate.  Pulses:       Femoral pulses are 2+ on the right side, and 2+ on the left side. Pulmonary/Chest: Effort normal.  Abdominal: Soft.  Musculoskeletal: Normal range of motion.  Neurological: She is alert and oriented to person, place, and time.  Skin: Skin is warm and dry.  Psychiatric: She has a normal mood and affect. Her behavior is normal. Judgment and thought content normal.    Data:        Assessment/Plan:     67 year old female with now occluded right femoral-popliteal artery bypass.  Thankfully her wound has healed with the bypass.  From the standpoint she does not need anything urgent but she does have right lower extremity pain is moderately decreased ABI.  We could attempt percutaneous thrombectomy of her graft but I would not want to give her overnight TPA given her current cancer.  We can also attempt primary stenting of her SFA given that we performed endarterectomy of the SFA in the past.  At this time they want to talk with her other doctors prior to proceeding with any procedures and I think this is certainly reasonable.  If they decide to undergo procedure for right lower extremity pain we would perform aortogram with possible right lower extremity intervention and they do not need to be seen first.  I would continue aspirin and Plavix throughout the procedure.  She can follow-up on a as needed basis given that she is to have multiple doctors office visits in the future.     Waynetta Sandy MD [facility]

## 2018-01-14 ENCOUNTER — Ambulatory Visit (HOSPITAL_COMMUNITY)
Admission: RE | Admit: 2018-01-14 | Discharge: 2018-01-14 | Disposition: A | Discharge status: Home / Self Care | Payer: Medicare Other | Source: Ambulatory Visit | Attending: Radiation Oncology | Admitting: Radiation Oncology

## 2018-01-14 DIAGNOSIS — C770 Secondary and unspecified malignant neoplasm of lymph nodes of head, face and neck: Secondary | ICD-10-CM

## 2018-01-14 DIAGNOSIS — R1319 Other dysphagia: Secondary | ICD-10-CM | POA: Diagnosis not present

## 2018-01-15 ENCOUNTER — Telehealth: Payer: Self-pay | Admitting: *Deleted

## 2018-01-15 NOTE — Telephone Encounter (Signed)
CALLED PATIENT'S DAUGHTER TESSA TO INFORM OF NUTRITION APPT. ON 01-18-18 @ 9:45 AM WITH BARBARA NEFF, SPOKE WITH PATIENT'S DAUGHTER TESSA AND SHE IS AWARE OF THIS APPT.

## 2018-01-18 ENCOUNTER — Ambulatory Visit (HOSPITAL_COMMUNITY)
Admission: RE | Admit: 2018-01-18 | Discharge: 2018-01-18 | Disposition: A | Discharge status: Home / Self Care | Payer: Medicare Other | Source: Ambulatory Visit | Attending: Otolaryngology | Admitting: Otolaryngology

## 2018-01-18 ENCOUNTER — Other Ambulatory Visit: Payer: Self-pay

## 2018-01-18 ENCOUNTER — Ambulatory Visit: Payer: Medicare Other | Attending: Radiation Oncology

## 2018-01-18 ENCOUNTER — Inpatient Hospital Stay: Payer: Medicare Other | Admitting: Nutrition

## 2018-01-18 DIAGNOSIS — M7989 Other specified soft tissue disorders: Secondary | ICD-10-CM | POA: Insufficient documentation

## 2018-01-18 DIAGNOSIS — R1312 Dysphagia, oropharyngeal phase: Secondary | ICD-10-CM | POA: Diagnosis present

## 2018-01-18 DIAGNOSIS — R293 Abnormal posture: Secondary | ICD-10-CM | POA: Insufficient documentation

## 2018-01-18 DIAGNOSIS — R221 Localized swelling, mass and lump, neck: Secondary | ICD-10-CM | POA: Insufficient documentation

## 2018-01-18 DIAGNOSIS — C801 Malignant (primary) neoplasm, unspecified: Secondary | ICD-10-CM | POA: Insufficient documentation

## 2018-01-18 DIAGNOSIS — J984 Other disorders of lung: Secondary | ICD-10-CM | POA: Insufficient documentation

## 2018-01-18 DIAGNOSIS — J9859 Other diseases of mediastinum, not elsewhere classified: Secondary | ICD-10-CM | POA: Insufficient documentation

## 2018-01-18 DIAGNOSIS — M542 Cervicalgia: Secondary | ICD-10-CM | POA: Insufficient documentation

## 2018-01-18 LAB — GLUCOSE, CAPILLARY: Glucose-Capillary: 107 mg/dL — ABNORMAL HIGH (ref 65–99)

## 2018-01-18 MED ORDER — FLUDEOXYGLUCOSE F - 18 (FDG) INJECTION
6.8000 | Freq: Once | INTRAVENOUS | Status: AC | PRN
  Administered 2018-01-18: 6.8 via INTRAVENOUS

## 2018-01-18 NOTE — Therapy (Signed)
[facility] [address] [place], [place], [address] Phone: [phone]   Fax:  [phone]  Speech Language Pathology Evaluation  Patient Details  Name: Faith [name] MRN: [id number] Date of Birth: 1951/10/17 Referring Provider: Eppie Gibson, MD   Encounter Date: 01/18/2018  End of Session - 01/18/18 1242    Visit Number  1    Number of Visits  3    Date for SLP Re-Evaluation  03/26/18    SLP Start Time  9485    SLP Stop Time   1223    SLP Time Calculation (min)  38 min    Activity Tolerance  Patient tolerated treatment well       Past Medical History:  Diagnosis Date  . Anxiety   . Arthritis    "thighs; legs; hips" (07/05/2014)  . Bipolar disorder ([place])   . Cholelithiasis 07/2014  . Chronic bronchitis ([place])    "get it q yr"  . Chronic lower back pain   . Depression    pt. use to go to depression clinic, states she still has depression & anxiety but can't get to appt. so she hasn't had any med. for it in a while   . Dyspnea   . GERD (gastroesophageal reflux disease)   . High cholesterol   . Hypertension   . Mass in neck 12/2017   RIGHT SIDE OF NECK  . Peripheral vascular disease ([place])   . Schizophrenia ([place])   . Stroke [facility])     Past Surgical History:  Procedure Laterality Date  . ABDOMINAL AORTOGRAM N/A 01/07/2017   Procedure: Abdominal Aortogram;  Surgeon: Waynetta Sandy, MD;  Location: [facility];  Service: Cardiovascular;  Laterality: N/A;  . ABDOMINAL HYSTERECTOMY    . ANKLE FRACTURE SURGERY Right   . ANKLE HARDWARE REMOVAL Right   . APPENDECTOMY  ~ 1963  . BREAST BIOPSY Bilateral   . BREAST CYST EXCISION Bilateral    "not cancer"  . CATARACT EXTRACTION W/ INTRAOCULAR LENS  IMPLANT, BILATERAL    . CHOLECYSTECTOMY N/A 07/05/2014   Procedure: LAPAROSCOPIC CHOLECYSTECTOMY WITH INTRAOPERATIVE CHOLANGIOGRAM;  Surgeon: Gayland Curry, MD;  Location: [facility];  Service: General;   Laterality: N/A;  . EXCISIONAL HEMORRHOIDECTOMY    . FEMORAL-POPLITEAL BYPASS GRAFT Right 01/15/2017   Procedure: BYPASS GRAFT FEMORAL-POPLITEAL ARTERY RIGHT LEG;  Surgeon: Waynetta Sandy, MD;  Location: [facility];  Service: Vascular;  Laterality: Right;  . LAPAROSCOPIC CHOLECYSTECTOMY  07/05/2014  . LOWER EXTREMITY ANGIOGRAPHY Bilateral 01/07/2017   Procedure: Lower Extremity Angiography;  Surgeon: Waynetta Sandy, MD;  Location: [facility];  Service: Cardiovascular;  Laterality: Bilateral;  . MASS BIOPSY Right 12/25/2017   Procedure: INCISIONAL RIGHT NECK MASS BIOPSY;  Surgeon: Helayne Seminole, MD;  Location: [facility];  Service: ENT;  Laterality: Right;  . MULTIPLE TOOTH EXTRACTIONS  05/2013   "pulled my upper teeth"  . PILONIDAL CYST EXCISION      There were no vitals filed for this visit.      SLP Evaluation [facility] - 01/18/18 1123      SLP Visit Information   SLP Received On  01/18/18    Referring Provider  Eppie Gibson, MD    Onset Date  March 2018    Medical Diagnosis  Secondary and unspecified malignant neoplasm of lymph nodes      Subjective   Patient/Family Stated Goal  Get through radiation with WNL swallowing      Pain  Assessment   Pain Score  7     Pain Location  Neck    Pain Orientation  Right    Pain Onset  1 to 4 weeks ago    Pain Frequency  Constant    Pain Relieving Factors  Meds      General Information   HPI  67 year old female recently been diagnosed with right neck mass. Mediastinal masses noted during inpatient stay in March 2018, suggested follow up but according to medical records no follow up noted. Right neck mass noted in December 2018. Biopsy of neck mass 12-25-17 revealed poorly differentiated carcinoma. Pt with history of LCVA, schizophrenia, BiPolar disorder, GERD.      Prior Functional Status   Cognitive/Linguistic Baseline  Baseline deficits    Baseline deficit details  Memory    Type of Home  Apartment     Lives With   Significant other    Available Support  Family    Vocation  Retired      Associate Professor   Overall Cognitive Status  History of cognitive impairments - at baseline    Memory  Impaired    Memory Impairment  Decreased recall of new information;Decreased short term memory      Oral Motor/Sensory Function   Overall Oral Motor/Sensory Function  Impaired    Labial ROM  Within Functional Limits    Labial Strength  Within Functional Limits    Lingual ROM  Within Functional Limits    Lingual Symmetry  Within Functional Limits    Lingual Strength  Reduced Right    Facial ROM  Reduced left;Reduced right    Velum  Within Functional Limits    Overall Oral Motor/Sensory Function  SLP appreciates rt neck mass. Ulcerated.       Motor Speech   Overall Motor Speech  Appears within functional limits for tasks assessed                      SLP Education - 01/18/18 1241    Education provided  Yes    Education Details  HEP, late effects head/neck radiation    Person(s) Educated  Patient;Caregiver(s)    Methods  Explanation;Demonstration;Verbal cues;Handout    Comprehension  Verbalized understanding;Returned demonstration;Need further instruction       SLP Short Term Goals - 01/18/18 1244      SLP SHORT TERM GOAL #1   Title  pt will complete HEP with occasional min A     Time  1    Period  -- visits    Status  New      SLP SHORT TERM GOAL #2   Title  pt will tell SLP why he is completing HEP with mod A    Time  1    Period  -- visits       SLP Long Term Goals - 01/18/18 1245      SLP LONG TERM GOAL #1   Title  pt will complete HEP with rare min A    Time  2    Period  -- visits    Status  New      SLP LONG TERM GOAL #2   Title  pt will tell SLP 3 overt s/s of aspiration PNA with modified independence    Time  2    Period  -- visits    Status  New      SLP LONG TERM GOAL #3   Title  pt will tell  SLP why he is completing HEP     Time  2    Period  -- visits     Status  New       Plan - 01/18/18 1242    Clinical Impression Statement  Pt with oropharyngeal dysphagia as per modified barium swallow (MBSS), SLP discussed precautions suggested with pt and daughter after MBSS on 01-14-18. Pt reports soft diet and thin liquids and following precautions. No overt s/s aspiration PNA today. SLP unable to watch pt with POs due to PET scan at 1230 today. The probability of swallowing difficulty increases dramatically with the initiation of radiation and possibly chemotherapy. Pt will need to be followed by SLP for regular assessment of accurate HEP completion as well as for safety with POs both during and following treatment/s.    Speech Therapy Frequency  -- approx once per every 4 weeks    Duration  -- 2 more visits (3 total visits)    Treatment/Interventions  Aspiration precaution training;Diet toleration management by SLP;Pharyngeal strengthening exercises;Trials of upgraded texture/liquids;Internal/external aids;Patient/family education;SLP instruction and feedback;Compensatory techniques    Potential to Achieve Goals  Good    SLP Home Exercise Plan  provided today    Consulted and Agree with Plan of Care  Patient       Patient will benefit from skilled therapeutic intervention in order to improve the following deficits and impairments:   Dysphagia, oropharyngeal phase    Problem List Patient Active Problem List   Diagnosis Date Noted  . Secondary and unspecified malignant neoplasm of lymph nodes of head, face and neck ([place]) 01/05/2018  . Hyperkalemia 12/23/2017  . HLD (hyperlipidemia) 12/22/2017  . GERD (gastroesophageal reflux disease) 12/22/2017  . Stroke (cerebrum) ([place]) 12/22/2017  . Tobacco abuse 12/22/2017  . Mass in neck 12/22/2017  . Hypokalemia 12/22/2017  . Hypercalcemia 12/22/2017  . Nausea vomiting and diarrhea 12/22/2017  . AKI (acute kidney injury) ([place]) 12/22/2017  . Stroke-like symptom 02/14/2017  . Low potassium syndrome  02/14/2017  . PAD (peripheral artery disease) ([place]) 01/15/2017  . Nonhealing nonsurgical wound 12/05/2016  . Essential hypertension 07/06/2014  . Depression 07/06/2014  . Back pain 07/06/2014  . Chronic cholecystitis 07/05/2014  . Right sided abdominal pain 05/11/2014  . Cholelithiasis 05/11/2014    [facility] ,[place], [place]  01/18/2018, 12:52 PM  [facility] [address] [place], [place], [address] Phone: [phone]   Fax:  [phone]  Name: Faith [name] MRN: [id number] Date of Birth: 1951-08-10

## 2018-01-18 NOTE — Patient Instructions (Signed)
SWALLOWING EXERCISES Do these until 6 months after your last day of radiation, then 3 times per week afterwards  1. Effortful Swallows - Press your tongue against the roof of your mouth for 3 seconds, then squeeze          the muscles in your neck while you swallow your saliva or a sip of water - Repeat 15-20 times, 2-3 times a day, and use whenever you eat or drink  2. Masako Swallow - swallow with your tongue sticking out - Stick tongue out past your teeth and gently bite tongue with your teeth - Swallow, while holding your tongue with your teeth - Repeat 15-20 times, 2-3 times a day *use a wet spoon if your mouth gets dry*  3. Pitch Raise - Repeat "he", once per second in as high of a pitch as you can - Repeat 20 times, 2-3 times a day  4. Mendelsohn Maneuver - "half swallow" exercise - Start to swallow, and keep your Adam's apple up by squeezing hard with the            muscles of the throat - Hold the squeeze for 5-7 seconds and then relax - Repeat 15-20 times, 2-3 times a day *use a wet spoon if your mouth gets dry*  5. Breath Hold - Say "HUH!" loudly, then hold your breath for 3 seconds at your voice box - Repeat 20 times, 2-3 times a day  6. Chin pushback - Open your mouth  - Place your fist UNDER your chin near your neck, and push back with your fist for 5 seconds - Repeat 10 times, 2-3 times a day

## 2018-01-19 ENCOUNTER — Inpatient Hospital Stay (HOSPITAL_BASED_OUTPATIENT_CLINIC_OR_DEPARTMENT_OTHER): Payer: Medicare Other | Admitting: Oncology

## 2018-01-19 ENCOUNTER — Encounter: Payer: Self-pay | Admitting: Oncology

## 2018-01-19 ENCOUNTER — Telehealth: Payer: Self-pay

## 2018-01-19 VITALS — BP 146/67 | HR 66 | Temp 97.6°F | Resp 18 | Ht 61.0 in | Wt 129.1 lb

## 2018-01-19 DIAGNOSIS — C801 Malignant (primary) neoplasm, unspecified: Secondary | ICD-10-CM | POA: Diagnosis not present

## 2018-01-19 DIAGNOSIS — R918 Other nonspecific abnormal finding of lung field: Secondary | ICD-10-CM | POA: Diagnosis not present

## 2018-01-19 DIAGNOSIS — C770 Secondary and unspecified malignant neoplasm of lymph nodes of head, face and neck: Secondary | ICD-10-CM | POA: Diagnosis present

## 2018-01-19 NOTE — Progress Notes (Signed)
Faith [name] OFFICE PROGRESS NOTE  Faith Ebbs, MD [place] [address]  DIAGNOSIS: Stage IV poorly differentiated carcinoma, likely lung primary.  PRIOR THERAPY: None  CURRENT THERAPY: She is under consideration for palliative radiation to the right neck mass.  INTERVAL HISTORY: Faith [name] 67 y.o. female returns for routine follow-up visit accompanied by her daughter.  The patient is feeling fine today except for feeling sore to the right side of her neck.  She denies fevers and chills.  Denies chest pain, shortness of breath, cough, hemoptysis.  Denies nausea, vomiting, constipation, diarrhea.  She denies any recent weight loss or night sweats.  Patient had a recent MRI of the brain and a PET scan is here to discuss results and treatment options.  MEDICAL HISTORY: Past Medical History:  Diagnosis Date  . Anxiety   . Arthritis    "thighs; legs; hips" (07/05/2014)  . Bipolar disorder ([place])   . Cholelithiasis 07/2014  . Chronic bronchitis ([place])    "get it q yr"  . Chronic lower back pain   . Depression    pt. use to go to depression clinic, states she still has depression & anxiety but can't get to appt. so she hasn't had any med. for it in a while   . Dyspnea   . GERD (gastroesophageal reflux disease)   . High cholesterol   . Hypertension   . Mass in neck 12/2017   RIGHT SIDE OF NECK  . Peripheral vascular disease ([place])   . Schizophrenia ([place])   . Stroke [facility])     ALLERGIES:  is allergic to penicillins; codeine; and percocet [oxycodone-acetaminophen].  MEDICATIONS:  Current Outpatient Medications  Medication Sig Dispense Refill  . acetaminophen (TYLENOL) 325 MG tablet Take 2 tablets (650 mg total) by mouth every 6 (six) hours as needed for mild pain (or Fever >/= 101).    Marland Kitchen albuterol (PROVENTIL HFA;VENTOLIN HFA) 108 (90 Base) MCG/ACT inhaler Inhale 1-2 puffs into the lungs every 6 (six) hours as needed for wheezing or shortness of  breath.    . AMITIZA 24 MCG capsule Take 24 mcg by mouth 2 (two) times daily.  0  . aspirin EC 325 MG tablet Take 1 tablet (325 mg total) by mouth daily. 30 tablet 0  . clopidogrel (PLAVIX) 75 MG tablet Take 1 tablet (75 mg total) by mouth daily. 30 tablet 0  . diclofenac sodium (VOLTAREN) 1 % GEL Apply 2 g topically daily as needed (pain).     . fluticasone (FLONASE) 50 MCG/ACT nasal spray Place 2 sprays into both nostrils daily.   0  . furosemide (LASIX) 20 MG tablet Take 20 mg by mouth daily as needed for fluid or edema.     . gabapentin (NEURONTIN) 300 MG capsule Take 300 mg by mouth 3 (three) times daily.     . Linaclotide (LINZESS) 290 MCG CAPS capsule Take 290-580 mcg by mouth daily as needed (constipation).     Marland Kitchen loratadine (CLARITIN) 10 MG tablet Take 10 mg by mouth daily.    . methocarbamol (ROBAXIN) 750 MG tablet Take 750 mg by mouth 2 (two) times daily as needed for muscle spasms.     . mometasone (ELOCON) 0.1 % ointment Apply 1 application topically daily as needed (irritation).     Marland Kitchen omeprazole (PRILOSEC) 20 MG capsule Take 20 mg by mouth 2 (two) times daily before a meal.    . oxyCODONE-acetaminophen (PERCOCET) 10-325 MG tablet Take 1 tablet by  mouth every 6 (six) hours as needed for pain. 30 tablet 0  . polyethylene glycol (MIRALAX / GLYCOLAX) packet Take 17 g by mouth daily. 14 each 0  . potassium chloride 20 MEQ/15ML (10%) SOLN Take 15 mLs by mouth daily.    . Vitamin D, Ergocalciferol, (DRISDOL) 50000 units CAPS capsule Take 50,000 Units by mouth every 7 (seven) days.    Marland Kitchen albuterol (PROVENTIL) (2.5 MG/3ML) 0.083% nebulizer solution Take 2.5 mg by nebulization every 6 (six) hours as needed for wheezing or shortness of breath.    Marland Kitchen atorvastatin (LIPITOR) 10 MG tablet take 1 tablet by mouth once daily (Patient not taking: Reported on 01/19/2018) 30 tablet 0  . diphenhydrAMINE (BENADRYL) 25 MG tablet Take 2 tablets (50 mg total) by mouth at bedtime as needed for sleep. (Patient not  taking: Reported on 01/19/2018) 20 tablet 0  . sodium bicarbonate 650 MG tablet Take 2 tablets (1,300 mg total) by mouth 2 (two) times daily. (Patient not taking: Reported on 01/19/2018) 60 tablet 0   No current facility-administered medications for this visit.     SURGICAL HISTORY:  Past Surgical History:  Procedure Laterality Date  . ABDOMINAL AORTOGRAM N/A 01/07/2017   Procedure: Abdominal Aortogram;  Surgeon: Waynetta Sandy, MD;  Location: [facility];  Service: Cardiovascular;  Laterality: N/A;  . ABDOMINAL HYSTERECTOMY    . ANKLE FRACTURE SURGERY Right   . ANKLE HARDWARE REMOVAL Right   . APPENDECTOMY  ~ 1963  . BREAST BIOPSY Bilateral   . BREAST CYST EXCISION Bilateral    "not cancer"  . CATARACT EXTRACTION W/ INTRAOCULAR LENS  IMPLANT, BILATERAL    . CHOLECYSTECTOMY N/A 07/05/2014   Procedure: LAPAROSCOPIC CHOLECYSTECTOMY WITH INTRAOPERATIVE CHOLANGIOGRAM;  Surgeon: Gayland Curry, MD;  Location: [facility];  Service: General;  Laterality: N/A;  . EXCISIONAL HEMORRHOIDECTOMY    . FEMORAL-POPLITEAL BYPASS GRAFT Right 01/15/2017   Procedure: BYPASS GRAFT FEMORAL-POPLITEAL ARTERY RIGHT LEG;  Surgeon: Waynetta Sandy, MD;  Location: [facility];  Service: Vascular;  Laterality: Right;  . LAPAROSCOPIC CHOLECYSTECTOMY  07/05/2014  . LOWER EXTREMITY ANGIOGRAPHY Bilateral 01/07/2017   Procedure: Lower Extremity Angiography;  Surgeon: Waynetta Sandy, MD;  Location: [facility];  Service: Cardiovascular;  Laterality: Bilateral;  . MASS BIOPSY Right 12/25/2017   Procedure: INCISIONAL RIGHT NECK MASS BIOPSY;  Surgeon: Helayne Seminole, MD;  Location: [facility];  Service: ENT;  Laterality: Right;  . MULTIPLE TOOTH EXTRACTIONS  05/2013   "pulled my upper teeth"  . PILONIDAL CYST EXCISION      REVIEW OF SYSTEMS:   Review of Systems  Constitutional: Negative for appetite change, chills, fatigue, fever and unexpected weight change.  HENT:   Negative for mouth sores,  nosebleeds, sore throat and trouble swallowing.  Positive for a right neck mass.  Eyes: Negative for eye problems and icterus.  Respiratory: Negative for cough, hemoptysis, shortness of breath and wheezing.   Cardiovascular: Negative for chest pain and leg swelling.  Gastrointestinal: Negative for abdominal pain, constipation, diarrhea, nausea and vomiting.  Genitourinary: Negative for bladder incontinence, difficulty urinating, dysuria, frequency and hematuria.   Musculoskeletal: Negative for back pain, gait problem, neck pain and neck stiffness.  Skin: Negative for itching and rash.  Neurological: Negative for dizziness, extremity weakness, gait problem, headaches, light-headedness and seizures.  Hematological: Negative for adenopathy. Does not bruise/bleed easily.  Psychiatric/Behavioral: Negative for confusion, depression and sleep disturbance. The patient is not nervous/anxious.     PHYSICAL EXAMINATION:  Blood  pressure (!) 146/67, pulse 66, temperature 97.6 F (36.4 C), temperature source Oral, resp. rate 18, height 5\' 1"  (1.549 m), weight 129 lb 1.6 oz (58.6 kg), SpO2 100 %.  ECOG PERFORMANCE STATUS: 1 - Symptomatic but completely ambulatory  Physical Exam  Constitutional: Oriented to person, place, and time and well-developed, well-nourished, and in no distress. No distress.  HENT:  Head: Normocephalic and atraumatic.  Mouth/Throat: Oropharynx is clear and moist. No oropharyngeal exudate.  Eyes: Conjunctivae are normal. Right eye exhibits no discharge. Left eye exhibits no discharge. No scleral icterus.  Neck: Normal range of motion.  Right neck mass which measures approximately 5 cm in diameter.  Cardiovascular: Normal rate, regular rhythm, normal heart sounds and intact distal pulses.   Pulmonary/Chest: Effort normal and breath sounds normal. No respiratory distress. No wheezes. No rales.  Abdominal: Soft. Bowel sounds are normal. Exhibits no distension and no mass. There is no  tenderness.  Musculoskeletal: Normal range of motion. Exhibits no edema.  Lymphadenopathy:    No cervical adenopathy.  Neurological: Alert and oriented to person, place, and time. Exhibits normal muscle tone. Gait normal. Coordination normal.  Skin: Skin is warm and dry. No rash noted. Not diaphoretic. No erythema. No pallor.  Psychiatric: Mood, memory and judgment normal.  Vitals reviewed.  LABORATORY DATA: Lab Results  Component Value Date   WBC 9.0 12/25/2017   HGB 9.9 (L) 12/25/2017   HCT 28.8 (L) 12/25/2017   MCV 84.7 12/25/2017   PLT 152 12/25/2017      Chemistry      Component Value Date/Time   NA 143 12/25/2017 0611   K 4.5 12/25/2017 0611   CL 123 (H) 12/25/2017 0611   CO2 15 (L) 12/25/2017 0611   BUN 9 12/25/2017 0611   CREATININE 0.90 12/25/2017 0611      Component Value Date/Time   CALCIUM 10.4 (H) 12/25/2017 0611   ALKPHOS 78 12/23/2017 1358   AST 17 12/23/2017 1358   ALT 9 (L) 12/23/2017 1358   BILITOT 0.7 12/23/2017 1358       RADIOGRAPHIC STUDIES:     ASSESSMENT/PLAN:  Secondary and unspecified malignant neoplasm of lymph nodes of head, face and neck ([place]) This is a very pleasant 67 year old African-American female that presented with right neck as well as mediastinal lymphadenopathy poorly differentiated carcinoma likely lung primary. She had a recent MRI of the brain and PET scan is here to discuss results and treatment options.  The patient was seen with Dr. Julien Nordmann who had a lengthy discussion with the patient and her daughter today.  He explained the MRI of the brain does not show any evidence of.  The PET scan indicates the patient has stage IV disease which is likely lung cancer.  We discussed the prognosis and that treatment would be palliative.  Recommend that she proceed with palliative radiation to the right neck mass.  Once radiation is complete, will consider systemic chemotherapy with carboplatin for an AUC of 5, paclitaxel 175 mg/m, and  Keytruda 200 mg IV given every 3 weeks.  We will plan to have a lengthy discussion with the patient and her daughter at the next visit about these medications.  A message has been sent to Dr. Pearlie Oyster office to get her set up for radiation to her right neck mass.  We will plan to see the patient back in our office in approximately 5 weeks radiation to discuss systemic treatment.  She was advised to call immediately if she has  any concerning symptoms in the interval. The patient voices understanding of current disease status and treatment options and is in agreement with the current care plan.  All questions were answered. The patient knows to call the clinic with any problems, questions or concerns. We can certainly see the patient much sooner if necessary.  Orders Placed This Encounter  Procedures  . CBC with Differential (Cancer Center Only)    Standing Status:   Future    Standing Expiration Date:   01/19/2019  . CMP ([facility] only)    Standing Status:   Future    Standing Expiration Date:   01/19/2019  . Ambulatory referral to Radiation Oncology    Referral Priority:   Routine    Referral Type:   Consultation    Referral Reason:   Specialty Services Required    Referred to Provider:   Eppie Gibson, MD    Requested Specialty:   Radiation Oncology    Number of Visits Requested:   1     Mikey Bussing, DNP, AGPCNP-BC, AOCNP 01/19/18  ADDENDUM: Hematology/Oncology Attending: I had a face-to-face encounter with the patient.  I recommended her care plan.  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  She presented with large neck mass in addition to mediastinal lymphadenopathy as well as bilateral pulmonary nodules. I had a lengthy discussion with the patient and her daughter today about her condition and treatment options. I  recommended for the patient to see Dr. Isidore Moos for consideration of palliative radiotherapy to the large neck mass.  I will arrange for the patient to come back for follow-up visit in around 5 weeks for reevaluation and discussion of systemic treatment options.  I may consider her for treatment with carboplatin, paclitaxel and Keytruda. I discussed with the patient and her daughter the adverse effect of this treatment. She was advised to call immediately if she has any concerning symptoms in the interval.  Disclaimer: This note was dictated with voice recognition software. Similar sounding words can inadvertently be transcribed and may be missed upon review. Eilleen Kempf, MD 01/20/18

## 2018-01-19 NOTE — Telephone Encounter (Signed)
Schedule appointment  With M.M. and called SQ. office to get appointment. Priinted avs and calender of upcoming appointments. Per 2/19 los

## 2018-01-19 NOTE — Assessment & Plan Note (Signed)
This is a very pleasant 67 year old African-American female that presented with right neck as well as mediastinal lymphadenopathy poorly differentiated carcinoma likely lung primary. She had a recent MRI of the brain and PET scan is here to discuss results and treatment options.  The patient was seen with Dr. Julien Nordmann who had a lengthy discussion with the patient and her daughter today.  He explained the MRI of the brain does not show any evidence of.  The PET scan indicates the patient has stage IV disease which is likely lung cancer.  We discussed the prognosis and that treatment would be palliative.  Recommend that she proceed with palliative radiation to the right neck mass.  Once radiation is complete, will consider systemic chemotherapy with carboplatin for an AUC of 5, paclitaxel 175 mg/m, and Keytruda 200 mg IV given every 3 weeks.  We will plan to have a lengthy discussion with the patient and her daughter at the next visit about these medications.  A message has been sent to Dr. Pearlie Oyster office to get her set up for radiation to her right neck mass.  We will plan to see the patient back in our office in approximately 5 weeks radiation to discuss systemic treatment.  She was advised to call immediately if she has any concerning symptoms in the interval. The patient voices understanding of current disease status and treatment options and is in agreement with the current care plan.  All questions were answered. The patient knows to call the clinic with any problems, questions or concerns. We can certainly see the patient much sooner if necessary.

## 2018-01-21 ENCOUNTER — Telehealth: Payer: Self-pay

## 2018-01-21 NOTE — Telephone Encounter (Signed)
Spoke with daughter and she has changed her appointment calender for a 2:45 lab. Per 2/20 los

## 2018-01-22 ENCOUNTER — Ambulatory Visit: Payer: Medicare Other

## 2018-01-22 ENCOUNTER — Telehealth: Payer: Self-pay | Admitting: *Deleted

## 2018-01-22 ENCOUNTER — Ambulatory Visit: Payer: Medicare Other | Admitting: Radiation Oncology

## 2018-01-22 NOTE — Telephone Encounter (Signed)
Oncology Nurse Navigator Documentation  Spoke with pt's dtr, informed her of appt change for next week:  2/27 9:45 NE, 10:00 [place], 11:00 CT SIM.  She voiced understanding.  Gayleen Orem, RN, BSN Head & Neck Oncology Nurse [place] [facility] [place] [phone]

## 2018-01-25 NOTE — Progress Notes (Signed)
Has armband been applied?  Yes  Does patient have an allergy to IV contrast dye?: No   Has patient ever received premedication for IV contrast dye?: N/A  Does patient take metformin?: No  If patient does take metformin when was the last dose: N/A  Date of lab work: 12/25/17 BUN: 9 CR: 0.9 EGFR: > 60  IV site:   Has IV site been added to flowsheet?  Yes

## 2018-01-25 NOTE — Progress Notes (Signed)
Head and Neck Cancer Location of Tumor / Histology:  12/25/17 Diagnosis Soft tissue mass, biopsy, Right Neck - POORLY DIFFERENTIATED CARCINOMA.  [name] presented months ago with symptoms of: Faith [name] presented to Dr. Blenda Nicely on 11/26/17 after being referred by her PCP for right neck mass, that had been present for several months. She reported pain and difficulty turning her neck to Faith right. She also reported difficulty swallowing.   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   Nutrition Status Yes No Comments  Weight changes? [x]  []  She has lost a few lbs over Faith past few weeks.   Swallowing concerns? [x]  []  She is eating softer foods. She has difficulty chewing meat  PEG? []  [x]     Referrals Yes No Comments  Social Work? []  [x]    Dentistry? []  [x]    Swallowing therapy? []  [x]    Nutrition? []  [x]    Med/Onc? [x]  []  Dr. Earlie Server next 02/23/18   Safety Issues Yes No Comments  Prior radiation? []  [x]    Pacemaker/ICD? []  [x]    Possible current pregnancy? []  [x]    Is Faith [name] on methotrexate? []  [x]     Tobacco/Marijuana/Snuff/ETOH use: She is a former smoker quitting 12/18. She tells me today 01/27/18 that she "takes a few puffs daily". She does not drink alcohol.    Past/Anticipated interventions by otolaryngology, if any:  12/25/17 PROCEDURE(S): Incisional biopsy of right neck mass SURGEON: Gavin Pound, MD   Past/Anticipated interventions by medical oncology, if any:  01/19/18 Faith Dilling NP Faith [name] was seen with Dr. Julien Nordmann who had a lengthy discussion with Faith [name] and her daughter today.  He explained Faith MRI of Faith brain does not show any evidence of.  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  We discussed Faith prognosis and that treatment would be palliative.  Recommend that she proceed with palliative radiation to Faith right  neck mass.  Once radiation is complete, will consider systemic chemotherapy with carboplatin for an AUC of 5, paclitaxel 175 mg/m, and Keytruda 200 mg IV given every 3 weeks.  We will plan to have a lengthy discussion with Faith [name] and her daughter at Faith next visit about these medications  Dr. Earlie Server next 02/23/18    Current Complaints / other details:  01/18/18 PET   BP 113/69   Pulse 67   Temp 97.8 F (36.6 C)   Ht 5\' 1"  (1.549 m)   Wt 127 lb 12.8 oz (58 kg)   SpO2 100% Comment: room aior  BMI 24.15 kg/m    Wt Readings from Last 3 Encounters:  01/27/18 127 lb 12.8 oz (58 kg)  01/19/18 129 lb 1.6 oz (58.6 kg)  01/13/18 131 lb (59.4 kg)

## 2018-01-26 ENCOUNTER — Ambulatory Visit: Payer: Medicare Other

## 2018-01-26 ENCOUNTER — Ambulatory Visit: Payer: Medicare Other | Admitting: Radiation Oncology

## 2018-01-26 ENCOUNTER — Ambulatory Visit: Payer: Medicare Other | Admitting: Physical Therapy

## 2018-01-26 ENCOUNTER — Encounter: Payer: Medicare Other | Admitting: Nutrition

## 2018-01-27 ENCOUNTER — Encounter: Payer: Self-pay | Admitting: *Deleted

## 2018-01-27 ENCOUNTER — Ambulatory Visit
Admission: RE | Admit: 2018-01-27 | Discharge: 2018-01-27 | Disposition: A | Discharge status: Home / Self Care | Payer: Medicare Other | Source: Ambulatory Visit | Attending: Radiation Oncology | Admitting: Radiation Oncology

## 2018-01-27 ENCOUNTER — Encounter: Payer: Self-pay | Admitting: Radiation Oncology

## 2018-01-27 VITALS — BP 113/69 | HR 67 | Temp 97.8°F | Ht 61.0 in | Wt 127.8 lb

## 2018-01-27 DIAGNOSIS — F319 Bipolar disorder, unspecified: Secondary | ICD-10-CM | POA: Diagnosis not present

## 2018-01-27 DIAGNOSIS — C770 Secondary and unspecified malignant neoplasm of lymph nodes of head, face and neck: Secondary | ICD-10-CM | POA: Insufficient documentation

## 2018-01-27 DIAGNOSIS — Z7982 Long term (current) use of aspirin: Secondary | ICD-10-CM | POA: Diagnosis not present

## 2018-01-27 DIAGNOSIS — K219 Gastro-esophageal reflux disease without esophagitis: Secondary | ICD-10-CM | POA: Insufficient documentation

## 2018-01-27 DIAGNOSIS — R0789 Other chest pain: Secondary | ICD-10-CM | POA: Diagnosis not present

## 2018-01-27 DIAGNOSIS — Z79899 Other long term (current) drug therapy: Secondary | ICD-10-CM | POA: Diagnosis not present

## 2018-01-27 DIAGNOSIS — F209 Schizophrenia, unspecified: Secondary | ICD-10-CM | POA: Insufficient documentation

## 2018-01-27 DIAGNOSIS — Z510 Encounter for antineoplastic radiation therapy: Secondary | ICD-10-CM | POA: Diagnosis present

## 2018-01-27 DIAGNOSIS — M25511 Pain in right shoulder: Secondary | ICD-10-CM | POA: Diagnosis not present

## 2018-01-27 DIAGNOSIS — I10 Essential (primary) hypertension: Secondary | ICD-10-CM | POA: Insufficient documentation

## 2018-01-27 DIAGNOSIS — E7800 Pure hypercholesterolemia, unspecified: Secondary | ICD-10-CM | POA: Diagnosis not present

## 2018-01-27 DIAGNOSIS — C3490 Malignant neoplasm of unspecified part of unspecified bronchus or lung: Secondary | ICD-10-CM | POA: Diagnosis not present

## 2018-01-27 DIAGNOSIS — I739 Peripheral vascular disease, unspecified: Secondary | ICD-10-CM | POA: Insufficient documentation

## 2018-01-27 DIAGNOSIS — Z8673 Personal history of transient ischemic attack (TIA), and cerebral infarction without residual deficits: Secondary | ICD-10-CM | POA: Insufficient documentation

## 2018-01-27 MED ORDER — SODIUM CHLORIDE 0.9% FLUSH
10.0000 mL | Freq: Once | INTRAVENOUS | Status: AC
  Administered 2018-01-27: 10 mL via INTRAVENOUS

## 2018-01-27 NOTE — Addendum Note (Signed)
Encounter addended by: Loma Messing, LPN on: 08/10/3111 16:24 AM  Actions taken: MAR administration accepted

## 2018-01-27 NOTE — Progress Notes (Signed)
Radiation Oncology         [phone] ________________________________  Name: Faith [name] MRN: [id number]  Date: 01/27/2018  DOB: August 18, 1951  Follow-Up Visit Note  Outpatient  CC: [name], MD  Curt Bears, MD  Diagnosis and Prior Radiotherapy:    ICD-10-CM   1. 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 She was last seen on 01/04/2018 following noticing a right neck mass (along with difficulty turning her neck to the right and difficulty swallowing) x several months and being referred by her PCP.  She is being followed by Medical Oncologist, Dr. Earlie Server and her next appointment is on 02/23/2018. She presents today accompanied by her daughter.   Since her last visit to the office, she underwent MR Brain on 01/12/2018 with results of: IMPRESSION: Negative for metastatic disease to the brain. Mild chronic microvascular ischemia.  No acute abnormality.        She also had an appointment with a speech language pathologist on 01/14/2018 due to dysphagia, neck and mediastinal adenopathy with results showing: IMPRESSION: No significant laryngeal penetration. No tracheobronchial aspiration. Delayed oral transit with premature spill into the vallecula.  She also had a PET scan completed on 01/18/2018 with results of IMPRESSION: Large centrally necrotic right lower neck mass is markedly hypermetabolic with SUV max of 83.1. Bulky hypermetabolic mediastinal and bihilar adenopathy. Small but hypermetabolic lung lesions, likely metastatic disease. Moderate hypermetabolism at the anorectal junction could be due to internal hemorrhoids. Recommend correlation with digital rectal examination. No abdominal/pelvic lymphadenopathy.  I reviewed this  scan with our ENT team at tumor board.  On review of systems, she reports weight loss (few lbs), issues with swallowing, right shoulder pain and right upper chest wall pain. She is eating softer foods to aid with this and she is unable to chew meat. She denies any other symptoms.                     ALLERGIES:  is allergic to penicillins; codeine; and percocet [oxycodone-acetaminophen].  Meds: Current Outpatient Medications  Medication Sig Dispense Refill  . acetaminophen (TYLENOL) 325 MG tablet Take 2 tablets (650 mg total) by mouth every 6 (six) hours as needed for mild pain (or Fever >/= 101).    Marland Kitchen albuterol (PROVENTIL HFA;VENTOLIN HFA) 108 (90 Base) MCG/ACT inhaler Inhale 1-2 puffs into the lungs every 6 (six) hours as needed for wheezing or shortness of breath.    Marland Kitchen albuterol (PROVENTIL) (2.5 MG/3ML) 0.083% nebulizer solution Take 2.5 mg by nebulization every 6 (six) hours as needed for wheezing or shortness of breath.    . AMITIZA 24 MCG capsule Take 24 mcg by mouth 2 (two) times daily.  0  . aspirin EC 325 MG tablet Take 1 tablet (325 mg total) by mouth daily. 30 tablet 0  . clopidogrel (PLAVIX) 75 MG tablet Take 1 tablet (75 mg total) by mouth daily. 30 tablet 0  . diclofenac sodium (VOLTAREN) 1 % GEL Apply 2 g topically daily as needed (pain).     . fluticasone (FLONASE) 50 MCG/ACT nasal spray Place 2 sprays into both nostrils daily.   0  . furosemide (LASIX) 20 MG tablet Take 20 mg by mouth daily as needed for fluid or  edema.     . gabapentin (NEURONTIN) 300 MG capsule Take 300 mg by mouth 3 (three) times daily.     . Linaclotide (LINZESS) 290 MCG CAPS capsule Take 290-580 mcg by mouth daily as needed (constipation).     Marland Kitchen loratadine (CLARITIN) 10 MG tablet Take 10 mg by mouth daily.    . methocarbamol (ROBAXIN) 750 MG tablet Take 750 mg by mouth 2 (two) times daily as needed for muscle spasms.     . mometasone (ELOCON) 0.1 % ointment Apply 1 application topically daily as needed  (irritation).     Marland Kitchen omeprazole (PRILOSEC) 20 MG capsule Take 20 mg by mouth 2 (two) times daily before a meal.    . oxyCODONE-acetaminophen (PERCOCET) 10-325 MG tablet Take 1 tablet by mouth every 6 (six) hours as needed for pain. 30 tablet 0  . polyethylene glycol (MIRALAX / GLYCOLAX) packet Take 17 g by mouth daily. 14 each 0  . potassium chloride 20 MEQ/15ML (10%) SOLN Take 15 mLs by mouth daily.    . Vitamin D, Ergocalciferol, (DRISDOL) 50000 units CAPS capsule Take 50,000 Units by mouth every 7 (seven) days.    Marland Kitchen atorvastatin (LIPITOR) 10 MG tablet take 1 tablet by mouth once daily (Patient not taking: Reported on 01/19/2018) 30 tablet 0  . diphenhydrAMINE (BENADRYL) 25 MG tablet Take 2 tablets (50 mg total) by mouth at bedtime as needed for sleep. (Patient not taking: Reported on 01/19/2018) 20 tablet 0  . sodium bicarbonate 650 MG tablet Take 2 tablets (1,300 mg total) by mouth 2 (two) times daily. (Patient not taking: Reported on 01/19/2018) 60 tablet 0   No current facility-administered medications for this encounter.     Physical Findings: The patient is in no acute distress. Patient is alert and oriented.  height is 5\' 1"  (1.549 m) and weight is 127 lb 12.8 oz (58 kg). Her temperature is 97.8 F (36.6 C). Her blood pressure is 113/69 and her pulse is 67. Her oxygen saturation is 100%. .    General: Alert and oriented, in no acute distress HEENT: Head is normocephalic. Oropharynx is clear. Neck: Protuberant right lower neck mass which is eroding through the skin. Neck is supple, no palpable cervical or supraclavicular lymphadenopathy. Chest: TTP in the right upper chest, but no masses are appreciated there.  Lymphatics: see Neck Exam Skin: No concerning lesions. Musculoskeletal: Good ROM in her bilateral shoulders.  Neuro: tardive dyskinesia    Lab Findings: Lab Results  Component Value Date   WBC 9.0 12/25/2017   HGB 9.9 (L) 12/25/2017   HCT 28.8 (L) 12/25/2017   MCV 84.7  12/25/2017   PLT 152 12/25/2017    Radiographic Findings: Mr Jeri Cos YS Contrast  Result Date: 01/12/2018 CLINICAL DATA:  Head neck cancer.  Staging for metastatic disease. EXAM: MRI HEAD WITHOUT AND WITH CONTRAST TECHNIQUE: Multiplanar, multiecho pulse sequences of the brain and surrounding structures were obtained without and with intravenous contrast. CONTRAST:  68mL MULTIHANCE GADOBENATE DIMEGLUMINE 529 MG/ML IV SOLN COMPARISON:  CT head 11/26/2017.  MRI head 02/14/2017 FINDINGS: Brain: Artifact from metallic device in the right frontal scalp region, not present previously. Possibly in the hair. Ventricle size normal. Cerebral volume normal for age. Scattered small white matter hyperintensities bilaterally compatible with chronic ischemia. Chronic lacunar infarction in the left thalamus unchanged. Negative for acute infarct.  Negative for hemorrhage, mass, or edema Contrast enhanced images demonstrate no enhancing metastatic deposit. Vascular: Normal arterial flow voids Skull and upper cervical spine:  No skull lesion identified. Sinuses/Orbits: Bilateral cataract removal. Paranasal sinuses clear. Other: None IMPRESSION: Negative for metastatic disease to the brain. Mild chronic microvascular ischemia.  No acute abnormality. Electronically Signed   By: Franchot Gallo M.D.   On: 01/12/2018 19:17   Dg Op Swallowing Func-medicare/speech Path  Result Date: 01/14/2018 CLINICAL DATA:  Dysphagia. Neck and mediastinal adenopathy. History of CVA. EXAM: MODIFIED BARIUM SWALLOW TECHNIQUE: Different consistencies of barium were administered orally to the patient by the Speech Pathologist. Imaging of the pharynx was performed in the lateral projection. FLUOROSCOPY TIME:  Fluoroscopy Time:  1 minute 40 seconds Number of Acquired Spot Images: 0 COMPARISON:  02/14/2017 chest CT. FINDINGS: Thin liquid-premature spill. Delayed oral transit. Minimal retention in the vallecula. No significant penetration. No aspiration.  Pure-premature spill into the vallecula. Delayed oral transit. Otherwise normal. Pure with cracker- premature spill into the vallecula. Delayed oral transit. Otherwise normal. IMPRESSION: No significant laryngeal penetration. No tracheobronchial aspiration. Delayed oral transit with premature spill into the vallecula. Please refer to the Speech Pathologists report for complete details and recommendations. Electronically Signed   By: Ilona Sorrel M.D.   On: 01/14/2018 14:46 Objective Swallowing Evaluation: Type of Study: MBS-Modified Barium Swallow Study  Patient Details Name: Faith [name] MRN: [id number] Date of Birth: May 05, 1951 Today's Date: 01/14/2018 Time: 63 Past Medical History: Past Medical History: Diagnosis Date . Anxiety  . Arthritis   "thighs; legs; hips" (07/05/2014) . Bipolar disorder ([place])  . Cholelithiasis 07/2014 . Chronic bronchitis ([place])   "get it q yr" . Chronic lower back pain  . Depression   pt. use to go to depression clinic, states she still has depression & anxiety but can't get to appt. so she hasn't had any med. for it in a while  . Dyspnea  . GERD (gastroesophageal reflux disease)  . High cholesterol  . Hypertension  . Mass in neck 12/2017  RIGHT SIDE OF NECK . Peripheral vascular disease ([place])  . Schizophrenia ([place])  . Stroke [facility])  Past Surgical History: Past Surgical History: Procedure Laterality Date . ABDOMINAL AORTOGRAM N/A 01/07/2017  Procedure: Abdominal Aortogram;  Surgeon: Waynetta Sandy, MD;  Location: [place] [facility];  Service: Cardiovascular;  Laterality: N/A; . ABDOMINAL HYSTERECTOMY   . ANKLE FRACTURE SURGERY Right  . ANKLE HARDWARE REMOVAL Right  . APPENDECTOMY  ~ 1963 . BREAST BIOPSY Bilateral  . BREAST CYST EXCISION Bilateral   "not cancer" . CATARACT EXTRACTION W/ INTRAOCULAR LENS  IMPLANT, BILATERAL   . CHOLECYSTECTOMY N/A 07/05/2014  Procedure: LAPAROSCOPIC CHOLECYSTECTOMY WITH INTRAOPERATIVE CHOLANGIOGRAM;  Surgeon: Gayland Curry, MD;  Location: [place];   Service: General;  Laterality: N/A; . EXCISIONAL HEMORRHOIDECTOMY   . FEMORAL-POPLITEAL BYPASS GRAFT Right 01/15/2017  Procedure: BYPASS GRAFT FEMORAL-POPLITEAL ARTERY RIGHT LEG;  Surgeon: Waynetta Sandy, MD;  Location: [place];  Service: Vascular;  Laterality: Right; . LAPAROSCOPIC CHOLECYSTECTOMY  07/05/2014 . LOWER EXTREMITY ANGIOGRAPHY Bilateral 01/07/2017  Procedure: Lower Extremity Angiography;  Surgeon: Waynetta Sandy, MD;  Location: [place] [facility];  Service: Cardiovascular;  Laterality: Bilateral; . MASS BIOPSY Right 12/25/2017  Procedure: INCISIONAL RIGHT NECK MASS BIOPSY;  Surgeon: Helayne Seminole, MD;  Location: [place];  Service: ENT;  Laterality: Right; . MULTIPLE TOOTH EXTRACTIONS  05/2013  "pulled my upper teeth" . PILONIDAL CYST EXCISION   HPI: 68 year old female referred for outpatient MBS to objectively assess swallow function and safety, as well as identify least restrictive diet. Pt has recently been diagnosed with right  neck mass. Underwent biopsy and CT - awaiting results. PET scan 01/18/18. Pt with history of LCVA, schizophrenia, BiPolar disorder, GERD.  Subjective: Pt seen in radiology for outpatient MBS. Daughter present Assessment / Plan / Recommendation CHL IP CLINICAL IMPRESSIONS 01/14/2018 Clinical Impression Pt presents with mild oropharyngeal dysphagia, characterized orally by poor bolus formation and premature spillage to the vallecular sinus on puree and solid consistencies. Pharyngeally, pt exhibits delayed swallow reflex (trigger noted at the vallecula across consistencies) due to decreased sensation for primary trigger site. One episode of trace flash penetration was noted during the swallow of thin liquid via cup, however, this was not replicated despite multiple presentations. Slight vallecular residue noted on puree and solid consistencies, which pt sensed and cleared with dry swallow. Recommend soft solids with additional moisture as needed. Thin liquids via  cup or straw. Meds 1-2 at a time with liquid or puree. Pt/daughter were encouraged to pursue outpatient speech therapy if radiation treatment is decided on, once testing is complete, and results are available. Fortunately, they already have an appointment with Garald Balding, SLP at the [facility] in the near future.  SLP Visit Diagnosis Dysphagia, oropharyngeal phase (R13.12)   Impact on safety and function Mild aspiration risk   CHL IP TREATMENT RECOMMENDATION 01/14/2018 Treatment Recommendations Defer treatment plan to f/u with SLP   Prognosis 01/14/2018 Prognosis for Safe Diet Advancement Fair Barriers to Reach Goals Medication; radiation treatment?   CHL IP DIET RECOMMENDATION 01/14/2018 SLP Diet Recommendations Dysphagia 3 (Mech soft) solids;Thin liquid Liquid Administration via Cup;Straw Medication Administration Whole meds with liquid Compensations Minimize environmental distractions;Slow rate;Small sips/bites;Follow solids with liquid Postural Changes Seated upright at 90 degrees Remain semi-upright after after feeds/meals   CHL IP OTHER RECOMMENDATIONS 01/14/2018 Oral Care Recommendations Oral care before and after PO   CHL IP FOLLOW UP RECOMMENDATIONS 01/14/2018 Follow up Recommendations Outpatient SLP    CHL IP ORAL PHASE 01/14/2018 Oral Phase Impaired Oral - Puree Premature spillage, especially with larger bolus Oral - Regular Piecemeal swallowing; especially with larger bolus  CHL IP PHARYNGEAL PHASE 01/14/2018 Pharyngeal Phase Impaired Pharyngeal- Thin Cup Delayed swallow initiation-vallecula;Reduced airway/laryngeal closure;Penetration/Aspiration during swallow Pharyngeal Material does not enter airway;Material enters airway, remains ABOVE vocal cords then ejected out Pharyngeal- Thin Straw Delayed swallow initiation-vallecula Pharyngeal- Puree Delayed swallow initiation-vallecula;Pharyngeal residue - valleculae; cleared with dry swallow Pharyngeal- Regular Delayed swallow initiation-vallecula;Pharyngeal  residue - valleculae; cleared with dry swallow  CHL IP CERVICAL ESOPHAGEAL PHASE 01/14/2018 Cervical Esophageal Phase [facility] Celia B. 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 EXAM: NUCLEAR MEDICINE PET SKULL BASE TO THIGH TECHNIQUE: 6.8 mCi F-18 FDG was injected intravenously. Full-ring PET imaging was performed from the skull base to thigh after the radiotracer. CT data was obtained and used for attenuation correction and anatomic localization. FASTING BLOOD GLUCOSE:  Value: 107 mg/dl COMPARISON:  CT scans from 11/26/2017 FINDINGS: NECK: Large centrally necrotic right lower neck mass measures approximately 4.8 x 4.8 cm and is markedly hypermetabolic with SUV max of 62.8. 7 mm hypermetabolic lower right parotid lymph node is noted. No left-sided adenopathy. CHEST: Bulky FDG avid mediastinal lymph nodes. Index node in the right paratracheal region on image number 57 measures 25 mm and SUV max  is 9.7. 20 mm short axis subcarinal adenopathy on image number 68 has an SUV max of 17.3. Bilateral hilar adenopathy with SUV max of approximately 10.0. Left lower middle mediastinal lymph node measures 12 mm on image number 74 and SUV max is 6.5. 6.5 mm cavitary left upper lobe lung lesion is FDG positive with SUV max of 2.9 6.5 mm right lower pulmonary nodule adjacent to the pericardium on image number 42 has an SUV max of 4.2. There is also a 6.2 mm pulmonary nodule in the right middle lobe on image number 40 with an SUV max 4.0. Smaller pulmonary nodules are again demonstrated but no obvious hypermetabolism. ABDOMEN/PELVIS: No abnormal hypermetabolic activity within the liver, pancreas, adrenal glands, or spleen. No hypermetabolic lymph nodes in the abdomen or pelvis.  Moderate FDG uptake noted at the rectal anal junction region. This is more likely due to hemorrhoids and tumor but recommend correlation with digital rectal examination. SKELETON: No focal hypermetabolic activity to suggest skeletal metastasis. IMPRESSION: 1. Large centrally necrotic right lower neck mass is markedly hypermetabolic with SUV max of 69.4. 2. Bulky hypermetabolic mediastinal and bihilar adenopathy. 3. Small but hypermetabolic lung lesions, likely metastatic disease. 4. Moderate hypermetabolism at the anorectal junction could be due to internal hemorrhoids. Recommend correlation with digital rectal examination. No abdominal/pelvic lymphadenopathy. Electronically Signed   By: Marijo Sanes M.D.   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 We discussed the available radiation techniques, and focused on the details of logistics and delivery.    We discussed the risks, benefits, and side effects of radiotherapy. Side effects may include but not necessarily be limited to: darkening/skin irritation, oozing from the site, swelling of tumor prior to shrinking, temporary sore throat, fatigue, and change in taste. No guarantees of treatment were given. A consent form was signed and placed in the patient's medical record.  The patient and her daughter were encouraged to ask questions that I answered to the best of my ability.   She will present for a [place] appointment today, 01/27/2018 with her treatment to start on Monday, 02/01/2018 with palliative intent for a total of 10 treatments.   I spoke with Dr. Earlie Server today about the pros and cons of treating her neck versus her neck+chest  We agreed that I should  just treat her neck palliatively, as he will pursue systemic therapy and follow the disease in her chest to see how she respond: our impression is likely that she has stage 4 metastatic disease based on the pattern conveyed by her PET imaging.    I spent 40 minutes face to face with the patient and more than 50% of that time was spent in counseling and/or coordination of care. _____________________________________   Eppie Gibson, MD   This document serves as a record of services personally performed by Eppie Gibson, MD. It was created on her behalf by Steva Colder, a trained medical scribe. The creation of this record is based on the scribe's personal observations and the provider's statements to them. This document has been checked and approved by the attending provider.

## 2018-01-27 NOTE — Progress Notes (Signed)
Head and Neck Cancer Simulation   Outpatient  Diagnosis:    ICD-10-CM   1. 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 An Aquaplast head and shoulder mask was custom fitted to the patient's anatomy. High-resolution CT axial imaging was obtained of the head and neck with contrast. I verified that the quality of the imaging is good for treatment planning. 1 Medically Necessary Treatment Device was fabricated and supervised by me: Aquaplast mask.   Treatment planning note I plan to treat the patient with 3D CONFORMAL RT. I plan to treat the patient's right neck neck adenopathy. I plan to treat to a total dose of 30 Gray in 10  fractions.   3D CONFORMAL planning Note  3D CONFORMAL RT is medically necessary and an important modality to deliver adequate dose to the patient's target tissues while sparing the patient's normal structures, including the: esophagus, spinal cord, parotid gland.  This justifies the use of 3D conformal RT in the patient's treatment.   -----------------------------------  Eppie Gibson, MD

## 2018-01-28 ENCOUNTER — Ambulatory Visit: Payer: Medicare Other | Admitting: Physical Therapy

## 2018-01-28 ENCOUNTER — Encounter: Payer: Self-pay | Admitting: Physical Therapy

## 2018-01-28 DIAGNOSIS — M542 Cervicalgia: Secondary | ICD-10-CM

## 2018-01-28 DIAGNOSIS — R1312 Dysphagia, oropharyngeal phase: Secondary | ICD-10-CM | POA: Diagnosis not present

## 2018-01-28 DIAGNOSIS — R293 Abnormal posture: Secondary | ICD-10-CM

## 2018-01-28 NOTE — Therapy (Signed)
[place], [place], [address] Phone: [phone]   Fax:  [phone]  Physical Therapy Evaluation  Patient Details  Name: Faith [name] MRN: [id number] Date of Birth: 1951-11-28 Referring Provider: Isidore Moos    Encounter Date: 01/28/2018  PT End of Session - 01/28/18 1649    Visit Number  1    Number of Visits  1    PT Start Time  1300    PT Stop Time  1345    PT Time Calculation (min)  45 min    Activity Tolerance  Patient tolerated treatment well    Behavior During Therapy  [facility] for tasks assessed/performed       Past Medical History:  Diagnosis Date  . Anxiety   . Arthritis    "thighs; legs; hips" (07/05/2014)  . Bipolar disorder ([place])   . Cholelithiasis 07/2014  . Chronic bronchitis ([place])    "get it q yr"  . Chronic lower back pain   . Depression    pt. use to go to depression clinic, states she still has depression & anxiety but can't get to appt. so she hasn't had any med. for it in a while   . Dyspnea   . GERD (gastroesophageal reflux disease)   . High cholesterol   . Hypertension   . Mass in neck 12/2017   RIGHT SIDE OF NECK  . Peripheral vascular disease ([place])   . Schizophrenia ([place])   . Stroke [facility])     Past Surgical History:  Procedure Laterality Date  . ABDOMINAL AORTOGRAM N/A 01/07/2017   Procedure: Abdominal Aortogram;  Surgeon: Waynetta Sandy, MD;  Location: [place] [facility];  Service: Cardiovascular;  Laterality: N/A;  . ABDOMINAL HYSTERECTOMY    . ANKLE FRACTURE SURGERY Right   . ANKLE HARDWARE REMOVAL Right   . APPENDECTOMY  ~ 1963  . BREAST BIOPSY Bilateral   . BREAST CYST EXCISION Bilateral    "not cancer"  . CATARACT EXTRACTION W/ INTRAOCULAR LENS  IMPLANT, BILATERAL    . CHOLECYSTECTOMY N/A 07/05/2014   Procedure: LAPAROSCOPIC CHOLECYSTECTOMY WITH INTRAOPERATIVE CHOLANGIOGRAM;  Surgeon: Gayland Curry, MD;  Location: [facility];  Service: General;  Laterality:  N/A;  . EXCISIONAL HEMORRHOIDECTOMY    . FEMORAL-POPLITEAL BYPASS GRAFT Right 01/15/2017   Procedure: BYPASS GRAFT FEMORAL-POPLITEAL ARTERY RIGHT LEG;  Surgeon: Waynetta Sandy, MD;  Location: [place];  Service: Vascular;  Laterality: Right;  . LAPAROSCOPIC CHOLECYSTECTOMY  07/05/2014  . LOWER EXTREMITY ANGIOGRAPHY Bilateral 01/07/2017   Procedure: Lower Extremity Angiography;  Surgeon: Waynetta Sandy, MD;  Location: [place] [facility];  Service: Cardiovascular;  Laterality: Bilateral;  . MASS BIOPSY Right 12/25/2017   Procedure: INCISIONAL RIGHT NECK MASS BIOPSY;  Surgeon: Helayne Seminole, MD;  Location: [place];  Service: ENT;  Laterality: Right;  . MULTIPLE TOOTH EXTRACTIONS  05/2013   "pulled my upper teeth"  . PILONIDAL CYST EXCISION      There were no vitals filed for this visit.   Subjective Assessment - 01/28/18 1308    Subjective  Pt is unsure of why she is here.  She had the radiation simlation yesterday and will start radiation on Monday.  She comes in with a cane and says she is already getting home health PT to work on balance and strengthening     Patient is accompained by:  Family member granddaughter [name]     Pertinent History  Pt will be starting 10 treatment of  palliative radiation to a mass on her neck that is believed to be from lung cancer.  She will also be undergoing chemotherapy past history included a stoke and fem -pop bypass     Currently in Pain?  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   Yes    Is the patient reluctant to leave their home because of a fear of falling?   Yes      [place] residence    Living Arrangements  Alone    Available Help at Discharge  Family daughter, granddaughter and aunt all help our     Type of [place] to enter    Entrance Stairs-Number of Steps  [place]  One level    [place] - single point      Prior Function   Level of Independence  Independent;Needs assistance with ADLs;Needs assistance with homemaking      Cognition   Overall Cognitive Status  History of cognitive impairments - at baseline      Observation/Other Assessments   Observations  Pt has a large mass on left neck that is open and draining.  Pt states it is open from the biopsy.the mass is painful and limits neck ROM       Coordination   Gross Motor Movements are Fluid and Coordinated  No pt moves slowly, and uses a straight cane for balance       Functional Tests   Functional tests  Sit to Stand 5 reps in 30 seconds with a straight cane       Posture/Postural Control   Posture/Postural Control  Postural limitations    Postural Limitations  Rounded Shoulders;Forward head      ROM / Strength   AROM / PROM / Strength  AROM;Strength      AROM   Overall AROM Comments  neck ROM is limited in extension, left rotaiton and left sidelbending to only a few degrees due to painful neck mass       Strength   Overall Strength  Deficits    Overall Strength Comments  generally weak to about 3/5       Ambulation/Gait   Ambulation/Gait  Yes    Ambulation/Gait Assistance  5: Supervision    Ambulation/Gait Assistance Details  pt slow, unsteady, uses straight cane for balance assist  appeard fatigued and needed to stop and rest     Ambulation Distance (Feet)  300 Feet    Assistive device  Straight cane    Stairs  Yes    Stairs Assistance  4: Min  guard  Gait Comments  Instructed pt and granddaughter  to go up and down steps sideways with both hands on left rail.                  Objective measurements completed on examination: See above findings.              PT Education - 01/28/18 1648    Education provided  Yes    Education Details  Neck and shoulder ROM as able, continue walking and moving around as able     Person(s) Educated  Patient;Caregiver(s)    Methods  Explanation;Demonstration;Handout    Comprehension  Verbalized understanding              Head and Neck Clinic Goals - 01/28/18 1655      Patient will be able to verbalize understanding of a home exercise program for cervical range of motion, posture, and walking.    Time  1    Period  Days    Status  Achieved      Patient will be able to verbalize understanding of proper sitting and standing posture.    Time  1    Period  Days    Status  Achieved      Patient will be able to verbalize understanding of lymphedema risk and availability of treatment for this condition.    Time  1    Period  Days    Status  Achieved         Plan - 01/28/18 1649    Clinical Impression Statement  Pt presents with large draining mass on right neck that she will be receiving palliative radiation for.  Could not measure neck circumference due to mass. She has functional limitations but is receiving home health PT for those.  Suggested possible rollator or transfer chair to granddaughter for longer distance ambulation and she will suggest it to HHPT     History and Personal Factors relevant to plan of care:  new cancer diagnosis live alone      Clinical Presentation  Evolving    Clinical Presentation due to:  upcoming chemo and radiation     Clinical Decision Making  Low could not fully eval for head and neck ca due to open mass     Rehab Potential  Fair    PT Frequency  One time visit    PT Treatment/Interventions  Patient/family  education    PT Next Visit Plan  No follow up at this time as pt is receiving HHPT     Consulted and Agree with Plan of Care  Patient;Family member/caregiver    Family Member Consulted  granddaughter [name]        Patient will benefit from skilled therapeutic intervention in order to improve the following deficits and impairments:  Decreased knowledge of precautions, Decreased range of motion, Postural dysfunction  Visit Diagnosis: Abnormal posture - Plan: PT plan of care cert/re-cert  Cervicalgia - Plan: PT plan of care cert/re-cert     Problem List Patient Active Problem List   Diagnosis Date Noted  . Secondary and unspecified malignant neoplasm of lymph nodes of head, face and neck ([place]) 01/05/2018  . Hyperkalemia 12/23/2017  . HLD (hyperlipidemia) 12/22/2017  . GERD (gastroesophageal reflux disease) 12/22/2017  . Stroke (cerebrum) ([place]) 12/22/2017  . Tobacco abuse 12/22/2017  . Mass in neck 12/22/2017  . Hypokalemia 12/22/2017  . Hypercalcemia 12/22/2017  . Nausea vomiting and diarrhea 12/22/2017  . AKI (acute kidney injury) ([place])  12/22/2017  . Stroke-like symptom 02/14/2017  . Low potassium syndrome 02/14/2017  . PAD (peripheral artery disease) ([place]) 01/15/2017  . Nonhealing nonsurgical wound 12/05/2016  . Essential hypertension 07/06/2014  . Depression 07/06/2014  . Back pain 07/06/2014  . Chronic cholecystitis 07/05/2014  . Right sided abdominal pain 05/11/2014  . Cholelithiasis 05/11/2014    Donato Heinz. Owens Shark PT   Norwood Levo 01/28/2018, 4:57 PM  [place], [place], [address] Phone: [phone]   Fax:  [phone]  Name: Maevyn A [name] MRN: [id number] Date of Birth: 1951-01-09

## 2018-01-29 ENCOUNTER — Ambulatory Visit: Payer: Medicare Other | Admitting: Radiation Oncology

## 2018-01-29 ENCOUNTER — Ambulatory Visit: Payer: Medicare Other

## 2018-01-29 DIAGNOSIS — Z510 Encounter for antineoplastic radiation therapy: Secondary | ICD-10-CM | POA: Diagnosis not present

## 2018-01-29 DIAGNOSIS — C7989 Secondary malignant neoplasm of other specified sites: Secondary | ICD-10-CM | POA: Insufficient documentation

## 2018-01-29 DIAGNOSIS — R590 Localized enlarged lymph nodes: Secondary | ICD-10-CM | POA: Insufficient documentation

## 2018-01-29 DIAGNOSIS — C801 Malignant (primary) neoplasm, unspecified: Secondary | ICD-10-CM | POA: Insufficient documentation

## 2018-01-29 NOTE — Progress Notes (Signed)
Oncology Nurse Navigator Documentation  Met with patient and her dtr during appt with Dr. Isidore Moos to provide support, continuity of care. They voiced understanding of 2 weeks RT to neck to shrink tumor followed by systemic therapy with Dr. Earlie Server.  Escorted them to CT SIM.  Pt tolerated procedure wo/ difficulty, pt and dtr denied questions. Showed them LINAC 2 tmt area, explained registration, RadOnc arrival and tmt preparation procedures. They voiced understanding of information provided. I encouraged them to call me with questions/concerns moving forward.  Gayleen Orem, RN, BSN Head & Neck Oncology Nurse [place] at [place] [phone]

## 2018-02-01 ENCOUNTER — Ambulatory Visit
Admission: RE | Admit: 2018-02-01 | Discharge: 2018-02-01 | Disposition: A | Discharge status: Home / Self Care | Payer: Medicare Other | Source: Ambulatory Visit | Attending: Radiation Oncology | Admitting: Radiation Oncology

## 2018-02-01 DIAGNOSIS — C770 Secondary and unspecified malignant neoplasm of lymph nodes of head, face and neck: Secondary | ICD-10-CM

## 2018-02-01 DIAGNOSIS — Z510 Encounter for antineoplastic radiation therapy: Secondary | ICD-10-CM | POA: Diagnosis not present

## 2018-02-01 MED ORDER — SONAFINE EX EMUL
1.0000 "application " | Freq: Once | CUTANEOUS | Status: AC
  Administered 2018-02-01: 1 via TOPICAL

## 2018-02-01 NOTE — Progress Notes (Signed)

## 2018-02-02 ENCOUNTER — Ambulatory Visit: Payer: Medicare Other

## 2018-02-02 ENCOUNTER — Ambulatory Visit
Admission: RE | Admit: 2018-02-02 | Discharge: 2018-02-02 | Disposition: A | Discharge status: Home / Self Care | Payer: Medicare Other | Source: Ambulatory Visit | Attending: Radiation Oncology | Admitting: Radiation Oncology

## 2018-02-02 ENCOUNTER — Ambulatory Visit: Payer: Medicare Other | Admitting: Radiation Oncology

## 2018-02-02 DIAGNOSIS — Z510 Encounter for antineoplastic radiation therapy: Secondary | ICD-10-CM | POA: Diagnosis not present

## 2018-02-03 ENCOUNTER — Telehealth: Payer: Self-pay

## 2018-02-03 ENCOUNTER — Ambulatory Visit
Admission: RE | Admit: 2018-02-03 | Discharge: 2018-02-03 | Disposition: A | Discharge status: Home / Self Care | Payer: Medicare Other | Source: Ambulatory Visit | Attending: Radiation Oncology | Admitting: Radiation Oncology

## 2018-02-03 ENCOUNTER — Telehealth: Payer: Self-pay | Admitting: Internal Medicine

## 2018-02-03 ENCOUNTER — Encounter: Payer: Self-pay | Admitting: *Deleted

## 2018-02-03 DIAGNOSIS — Z510 Encounter for antineoplastic radiation therapy: Secondary | ICD-10-CM | POA: Diagnosis not present

## 2018-02-03 NOTE — Telephone Encounter (Signed)
Ms. Jolliffe daughter called this morning and reported that her mother was having continued and increased pain to her neck. She reported that she was in tears this morning when she called her. I called back and offered an appointment with the symptom management clinic to have her evaluated today. Ms. Samet daughter requested that the appointment be tomorrow due to her work schedule and transportation issues getting Ms. Martinique to the cancer center today. I sent a request to scheduling for the appointment to be made, and one has been scheduled for tomorrow at 3:45 pm. Ms. Zapanta daughter is aware that if her symptoms become severe and uncontrollable they should present to the Emergency Room. She voiced her understanding and know to call me if they have any further questions or concerns.

## 2018-02-03 NOTE — Telephone Encounter (Signed)
Scheduled appt per 3/7 sch msg - spoke with the daughter and confirmed the appts that were added.

## 2018-02-04 ENCOUNTER — Encounter: Payer: Medicare Other | Admitting: Medical

## 2018-02-04 ENCOUNTER — Ambulatory Visit
Admission: RE | Admit: 2018-02-04 | Discharge: 2018-02-04 | Disposition: A | Discharge status: Home / Self Care | Payer: Medicare Other | Source: Ambulatory Visit | Attending: Radiation Oncology | Admitting: Radiation Oncology

## 2018-02-04 DIAGNOSIS — Z510 Encounter for antineoplastic radiation therapy: Secondary | ICD-10-CM | POA: Diagnosis not present

## 2018-02-04 NOTE — Progress Notes (Signed)
Late entry from 02/03/2018 at 1720. Received patient and her daughter, Faith [name], in the clinic following radiation therapy. Patient in tears. Patient reports pain in the right side of her face, right neck at tumor site, and right chest. Noted patient has been prescribed gabapentin, percocet, and methocarbamol to manage pain. Upon investigation determined she is taking medications around the clock as prescribed without relief. Also, patient reported the treatment mask hurt when applied today where it didn't in previous days. Discovered patient has been seeing Dr. Mirna Mires for pain management for years.  Encouraged patient and her daughter to reach out to Dr. Brandy Hale at [facility] (pain clinic) reference pain medications and management. Explained [facility] is glad to fax any needed records over to Dr. Mirna Mires to update him on her current situation. Assured patient and her daughter staff would be notified the mask has become painful so adjustments can be made if possible. Discouraged the use of a heating pad in the treatment area. Encouraged use of emollient bid as provided during her post sim education. Confirmed follow up appointment with [name] in symptom management for tomorrow. Will inform head and neck navigator and Dr. Pearlie Oyster nurse that treatment mask is painful and pain management needs to be addressed. Patient and daughter verbalized understanding of all reviewed and expressed appreciation for time spent.

## 2018-02-04 NOTE — Progress Notes (Addendum)
Oncology Nurse Navigator Documentation  Met with Ms. Martinique prior to RT.  She was accompanied by her dtr.  Started RT for R neck mass 3/4. She stated RT progressing wo/ difficulty. She reported notable R neck pain for which she was to see RadOnc MD following RT. I reinforced postsim ed re application of Sonafine. Encourage them to call me questions/needs.  Gayleen Orem, RN, BSN Head & Neck Oncology Nurse [place] at [place] [phone]

## 2018-02-05 ENCOUNTER — Ambulatory Visit
Admission: RE | Admit: 2018-02-05 | Discharge: 2018-02-05 | Disposition: A | Discharge status: Home / Self Care | Payer: Medicare Other | Source: Ambulatory Visit | Attending: Radiation Oncology | Admitting: Radiation Oncology

## 2018-02-05 DIAGNOSIS — Z510 Encounter for antineoplastic radiation therapy: Secondary | ICD-10-CM | POA: Diagnosis not present

## 2018-02-05 NOTE — Progress Notes (Signed)
Did not require to be seen. Needed to follow up with pain clinic. This encounter was created in error - please disregard.

## 2018-02-08 ENCOUNTER — Other Ambulatory Visit: Payer: Self-pay | Admitting: Radiation Oncology

## 2018-02-08 ENCOUNTER — Ambulatory Visit
Admission: RE | Admit: 2018-02-08 | Discharge: 2018-02-08 | Disposition: A | Discharge status: Home / Self Care | Payer: Medicare Other | Source: Ambulatory Visit | Attending: Radiation Oncology | Admitting: Radiation Oncology

## 2018-02-08 DIAGNOSIS — C760 Malignant neoplasm of head, face and neck: Secondary | ICD-10-CM

## 2018-02-08 DIAGNOSIS — Z510 Encounter for antineoplastic radiation therapy: Secondary | ICD-10-CM | POA: Diagnosis not present

## 2018-02-09 ENCOUNTER — Ambulatory Visit
Admission: RE | Admit: 2018-02-09 | Discharge: 2018-02-09 | Disposition: A | Discharge status: Home / Self Care | Payer: Medicare Other | Source: Ambulatory Visit | Attending: Radiation Oncology | Admitting: Radiation Oncology

## 2018-02-09 ENCOUNTER — Encounter: Payer: Self-pay | Admitting: General Practice

## 2018-02-09 ENCOUNTER — Inpatient Hospital Stay: Payer: Medicare Other | Attending: Internal Medicine

## 2018-02-09 DIAGNOSIS — R635 Abnormal weight gain: Secondary | ICD-10-CM

## 2018-02-09 DIAGNOSIS — Z1329 Encounter for screening for other suspected endocrine disorder: Secondary | ICD-10-CM

## 2018-02-09 DIAGNOSIS — I10 Essential (primary) hypertension: Secondary | ICD-10-CM | POA: Insufficient documentation

## 2018-02-09 DIAGNOSIS — C760 Malignant neoplasm of head, face and neck: Secondary | ICD-10-CM

## 2018-02-09 DIAGNOSIS — C7989 Secondary malignant neoplasm of other specified sites: Secondary | ICD-10-CM | POA: Insufficient documentation

## 2018-02-09 DIAGNOSIS — C801 Malignant (primary) neoplasm, unspecified: Secondary | ICD-10-CM | POA: Insufficient documentation

## 2018-02-09 DIAGNOSIS — Z510 Encounter for antineoplastic radiation therapy: Secondary | ICD-10-CM | POA: Diagnosis not present

## 2018-02-09 DIAGNOSIS — C770 Secondary and unspecified malignant neoplasm of lymph nodes of head, face and neck: Secondary | ICD-10-CM

## 2018-02-09 LAB — BASIC METABOLIC PANEL - CANCER CENTER ONLY
ANION GAP: 6 (ref 3–11)
BUN: 13 mg/dL (ref 7–26)
CO2: 22 mmol/L (ref 22–29)
Calcium: 12.4 mg/dL — ABNORMAL HIGH (ref 8.4–10.4)
Chloride: 109 mmol/L (ref 98–109)
Creatinine: 0.83 mg/dL (ref 0.60–1.10)
GFR, Est AFR Am: >60 mL/min (ref >60)
GLUCOSE: 107 mg/dL (ref 70–140)
POTASSIUM: 3.9 mmol/L (ref 3.5–5.1)
Sodium: 137 mmol/L (ref 136–145)

## 2018-02-09 NOTE — Progress Notes (Signed)
[place] CSW Progress Note  Met w patient and daughter in [place] office.  Patient wants help w referral for Medicaid [place] will facilitate this w Nurse Navigator and MD.  Reviewed patient needs in home - has weakness in right arm, walks w cane, sometimes uses wheelchair.  While wanting to be independent, does need help w ADLs such as bathing and dressing.  Family is able to assist, visit patient daily; however, feel that patient needs increased supervision for safety in the home.  Is currently working w [facility] for physical therapy and nurse supervisory care (per patient, RN comes twice/week to take vital signs and observe patient progress).  Daughter is working on referral for CAP services via Department of Social Services.  CSW explored needs for transportation help, patient aware of [place] transport and has used in the past.  Patient has also been advised by DSS worker to explore whether she is eligible for any services from the [place] as her husband was in the [employer].  Daughter and patient inquired about Meals on Wheels, [employer] information provided.  Also given information packet on Support Center and [employer] activities.  Encouraged to recontact CSW as needed.    Edwyna Shell, LCSW Clinical Social Worker Phone:  [phone]

## 2018-02-09 NOTE — Progress Notes (Signed)
Nutrition Assessment  Reason for Assessment: Consult    ASSESSMENT:   66 year old female with stage IV poorly differentiated carcinoma, likely lung primary.  Patient receiving palliative radiation to right neck mass.  Planning to start palliative chemotherapy at the conclusion of radiation therapy (last treatment 3/15).    Met with patient and daughter in clinic today.  Patient reports that she typically eats cottage cheese and fruit for breakfast and yogurt, lunch is usually bananas and cookies and supper (last night ate hot dog with onions and mustard and french fries).  Reports that family mostly brings her food or takes her out to eat.  Has tried boost/ensure shakes but does not really like them.  Patient reports trouble chewing, needs soft foods.    Patient has met with SLP and MBSS performed   NUTRITION - FOCUSED PHYSICAL EXAM: deferred today    MEDICATIONS: prilosec, miralax, KCL, Vit D, lasix   LABS: reviewed   ANTHROPOMETRICS: Height:   61 inches Weight:  127 lb 12.8 oz BMI:  24 UBW:   130-137 lb per patient.  Noted at that weight on 1/22 138 lb   7% weight loss in the last month.  Patient reports was weighed in radiation yesterday and lost more weight but can't remember weight number.  RD can't see radiation weight.    ESTIMATED ENERGY NEEDS:  Kcal:  1450-1700 calories/d Protein: 70-87 g/d Fluid:  1.7 L/d   NUTRITION DIAGNOSIS:  Severe Malnutrition related to cancer and cancer related treatments as evidenced by percent weight loss, energy intake < or equal to 50% for > or equal to 5 days.   DOCUMENTATION CODES:  Severe malnutrition in context of acute illness/injury   INTERVENTION:   Encouraged small frequent meals Discussed ways to add high calorie and high protein foods to current diet.  Fact sheets given. Discussed oral nutrition supplements.  Daughter with questions regarding protein powders.  Samples and coupons of shakes given.   GOAL:  Weight  gain   MONITOR:  PO intake, Weight trends   Next Visit: to be determined    Alyssha Housh B. Zenia Resides, [place], [place] Registered Dietitian [phone] (pager)

## 2018-02-10 ENCOUNTER — Ambulatory Visit
Admission: RE | Admit: 2018-02-10 | Discharge: 2018-02-10 | Disposition: A | Discharge status: Home / Self Care | Payer: Medicare Other | Source: Ambulatory Visit | Attending: Radiation Oncology | Admitting: Radiation Oncology

## 2018-02-10 DIAGNOSIS — Z510 Encounter for antineoplastic radiation therapy: Secondary | ICD-10-CM | POA: Diagnosis not present

## 2018-02-11 ENCOUNTER — Ambulatory Visit
Admission: RE | Admit: 2018-02-11 | Discharge: 2018-02-11 | Disposition: A | Discharge status: Home / Self Care | Payer: Medicare Other | Source: Ambulatory Visit | Attending: Radiation Oncology | Admitting: Radiation Oncology

## 2018-02-11 ENCOUNTER — Other Ambulatory Visit: Payer: Self-pay | Admitting: Radiation Oncology

## 2018-02-11 DIAGNOSIS — Z510 Encounter for antineoplastic radiation therapy: Secondary | ICD-10-CM | POA: Diagnosis not present

## 2018-02-11 DIAGNOSIS — C770 Secondary and unspecified malignant neoplasm of lymph nodes of head, face and neck: Secondary | ICD-10-CM

## 2018-02-11 MED ORDER — PROCHLORPERAZINE MALEATE 10 MG PO TABS: 10.0000 mg | ORAL_TABLET | Freq: Four times a day (QID) | ORAL | 0 refills | Status: DC | PRN

## 2018-02-11 MED ORDER — LORAZEPAM 0.5 MG PO TABS
0.5000 mg | ORAL_TABLET | Freq: Once | ORAL | Status: AC
  Administered 2018-02-11: 0.5 mg via SUBLINGUAL
  Filled 2018-02-11: qty 1

## 2018-02-11 MED ORDER — POLYETHYLENE GLYCOL 3350 17 G PO PACK: 17.0000 g | PACK | Freq: Every day | ORAL | 0 refills | Status: DC

## 2018-02-11 NOTE — Progress Notes (Signed)

## 2018-02-12 ENCOUNTER — Encounter: Payer: Self-pay | Admitting: Radiation Oncology

## 2018-02-12 ENCOUNTER — Ambulatory Visit
Admission: RE | Admit: 2018-02-12 | Discharge: 2018-02-12 | Disposition: A | Discharge status: Home / Self Care | Payer: Medicare Other | Source: Ambulatory Visit | Attending: Radiation Oncology | Admitting: Radiation Oncology

## 2018-02-12 ENCOUNTER — Telehealth: Payer: Self-pay | Admitting: Medical Oncology

## 2018-02-12 ENCOUNTER — Other Ambulatory Visit: Payer: Self-pay | Admitting: Radiation Oncology

## 2018-02-12 ENCOUNTER — Other Ambulatory Visit: Payer: Self-pay | Admitting: Medical Oncology

## 2018-02-12 ENCOUNTER — Ambulatory Visit: Payer: Medicare Other

## 2018-02-12 DIAGNOSIS — Z510 Encounter for antineoplastic radiation therapy: Secondary | ICD-10-CM | POA: Diagnosis not present

## 2018-02-12 DIAGNOSIS — C770 Secondary and unspecified malignant neoplasm of lymph nodes of head, face and neck: Secondary | ICD-10-CM | POA: Diagnosis not present

## 2018-02-12 DIAGNOSIS — C801 Malignant (primary) neoplasm, unspecified: Secondary | ICD-10-CM

## 2018-02-12 LAB — BASIC METABOLIC PANEL - CANCER CENTER ONLY
ANION GAP: 6 (ref 3–11)
BUN: 10 mg/dL (ref 7–26)
CALCIUM: 11.9 mg/dL — AB (ref 8.4–10.4)
CO2: 23 mmol/L (ref 22–29)
CREATININE: 0.78 mg/dL (ref 0.60–1.10)
Chloride: 107 mmol/L (ref 98–109)
GLUCOSE: 114 mg/dL (ref 70–140)
Potassium: 3.6 mmol/L (ref 3.5–5.1)
Sodium: 136 mmol/L (ref 136–145)

## 2018-02-12 LAB — CBC WITH DIFFERENTIAL (CANCER CENTER ONLY)
BASOS ABS: 0 10*3/uL (ref 0.0–0.1)
BASOS PCT: 0 %
EOS ABS: 0 10*3/uL (ref 0.0–0.5)
Eosinophils Relative: 1 %
HCT: 35.2 % (ref 34.8–46.6)
Hemoglobin: 11.8 g/dL (ref 11.6–15.9)
Lymphocytes Relative: 19 %
Lymphs Abs: 1.3 10*3/uL (ref 0.9–3.3)
MCH: 28.5 pg (ref 25.1–34.0)
MCHC: 33.5 g/dL (ref 31.5–36.0)
MCV: 85.2 fL (ref 79.5–101.0)
MONO ABS: 0.6 10*3/uL (ref 0.1–0.9)
MONOS PCT: 9 %
Neutro Abs: 4.9 10*3/uL (ref 1.5–6.5)
Neutrophils Relative %: 71 %
PLATELETS: 225 10*3/uL (ref 145–400)
RBC: 4.13 MIL/uL (ref 3.70–5.45)
RDW: 13.6 % (ref 11.2–14.5)
WBC Count: 7 10*3/uL (ref 3.9–10.3)

## 2018-02-12 MED ORDER — LIDOCAINE VISCOUS 2 % MT SOLN: OROMUCOSAL | 5 refills | Status: AC

## 2018-02-12 NOTE — Telephone Encounter (Signed)
[place] 11.9 Per Dr Lorn Junes scheduled for ivf tomorrow over 1 hours Dtr notified of appt. Orders entered.

## 2018-02-12 NOTE — Progress Notes (Signed)
  Radiation Oncology         [phone] ________________________________  Name: Faith [name] MRN: [id number]  Date: 02/12/2018  DOB: 02/22/1951  End of Treatment Note  Diagnosis:   67 y.o. female with lung cancer metastasized to neck    Indication for treatment:  Palliative       Radiation treatment dates:   02/01/2018 - 02/12/2018  Site/dose:   Right Neck / 30 Gy in 10 fractions  Beams/energy:   3D / 6X, 10X Photon  Narrative: The patient tolerated radiation treatment relatively well.  She did report pain on the right side of her neck and in her mouth and throat. She also that her vision is not good on her right side and that her right ear feels numb. She also reported nausea with occasional vomiting. A prescription was sent for Compazine to relieve nausea and Lidocaine mouthwash to relieve mouth/throat pain. In addition, there was an open area with some yellow drainage, approximately 1 cm in size. The patient was advised to apply neosporin and bandage to the open area on her neck. She was given Sonafine to apply to the rest of her neck.   Plan: The patient has completed radiation treatment. The patient will return to radiation oncology clinic for routine followup in one month. I advised them to call or return sooner if they have any questions or concerns related to their recovery or treatment.  -----------------------------------  Eppie Gibson, MD  This document serves as a record of services personally performed by Eppie Gibson, MD. It was created on her behalf by Rae Lips, a trained medical scribe. The creation of this record is based on the scribe's personal observations and the provider's statements to them. This document has been checked and approved by the attending provider.

## 2018-02-13 ENCOUNTER — Inpatient Hospital Stay: Payer: Medicare Other

## 2018-02-13 DIAGNOSIS — I10 Essential (primary) hypertension: Secondary | ICD-10-CM | POA: Diagnosis not present

## 2018-02-13 DIAGNOSIS — C7989 Secondary malignant neoplasm of other specified sites: Secondary | ICD-10-CM | POA: Diagnosis present

## 2018-02-13 DIAGNOSIS — C801 Malignant (primary) neoplasm, unspecified: Secondary | ICD-10-CM | POA: Diagnosis present

## 2018-02-13 MED ORDER — SODIUM CHLORIDE 0.9 % IV SOLN
INTRAVENOUS | Status: DC
  Administered 2018-02-13: 10:00:00 via INTRAVENOUS

## 2018-02-13 NOTE — Patient Instructions (Signed)
Dehydration, Adult Dehydration is when there is not enough fluid or water in your body. This happens when you lose more fluids than you take in. Dehydration can range from mild to very bad. It should be treated right away to keep it from getting very bad. Symptoms of mild dehydration may include:  Thirst.  Dry lips.  Slightly dry mouth.  Dry, warm skin.  Dizziness. Symptoms of moderate dehydration may include:  Very dry mouth.  Muscle cramps.  Dark pee (urine). Pee may be the color of tea.  Your body making less pee.  Your eyes making fewer tears.  Heartbeat that is uneven or faster than normal (palpitations).  Headache.  Light-headedness, especially when you stand up from sitting.  Fainting (syncope). Symptoms of very bad dehydration may include:  Changes in skin, such as: ? Cold and clammy skin. ? Blotchy (mottled) or pale skin. ? Skin that does not quickly return to normal after being lightly pinched and let go (poor skin turgor).  Changes in body fluids, such as: ? Feeling very thirsty. ? Your eyes making fewer tears. ? Not sweating when body temperature is high, such as in hot weather. ? Your body making very little pee.  Changes in vital signs, such as: ? Weak pulse. ? Pulse that is more than 100 beats a minute when you are sitting still. ? Fast breathing. ? Low blood pressure.  Other changes, such as: ? Sunken eyes. ? Cold hands and feet. ? Confusion. ? Lack of energy (lethargy). ? Trouble waking up from sleep. ? Short-term weight loss. ? Unconsciousness. Follow these instructions at home:  If told by your doctor, drink an ORS: ? Make an ORS by using instructions on the package. ? Start by drinking small amounts, about  cup (120 mL) every 5-10 minutes. ? Slowly drink more until you have had the amount that your doctor said to have.  Drink enough clear fluid to keep your pee clear or pale yellow. If you were told to drink an ORS, finish the ORS  first, then start slowly drinking clear fluids. Drink fluids such as: ? Water. Do not drink only water by itself. Doing that can make the salt (sodium) level in your body get too low (hyponatremia). ? Ice chips. ? Fruit juice that you have added water to (diluted). ? Low-calorie sports drinks.  Avoid: ? Alcohol. ? Drinks that have a lot of sugar. These include high-calorie sports drinks, fruit juice that does not have water added, and soda. ? Caffeine. ? Foods that are greasy or have a lot of fat or sugar.  Take over-the-counter and prescription medicines only as told by your doctor.  Do not take salt tablets. Doing that can make the salt level in your body get too high (hypernatremia).  Eat foods that have minerals (electrolytes). Examples include bananas, oranges, potatoes, tomatoes, and spinach.  Keep all follow-up visits as told by your doctor. This is important. Contact a doctor if:  You have belly (abdominal) pain that: ? Gets worse. ? Stays in one area (localizes).  You have a rash.  You have a stiff neck.  You get angry or annoyed more easily than normal (irritability).  You are more sleepy than normal.  You have a harder time waking up than normal.  You feel: ? Weak. ? Dizzy. ? Very thirsty.  You have peed (urinated) only a small amount of very dark pee during 6-8 hours. Get help right away if:  You have symptoms of   very bad dehydration.  You cannot drink fluids without throwing up (vomiting).  Your symptoms get worse with treatment.  You have a fever.  You have a very bad headache.  You are throwing up or having watery poop (diarrhea) and it: ? Gets worse. ? Does not go away.  You have blood or something green (bile) in your throw-up.  You have blood in your poop (stool). This may cause poop to look black and tarry.  You have not peed in 6-8 hours.  You pass out (faint).  Your heart rate when you are sitting still is more than 100 beats a  minute.  You have trouble breathing. This information is not intended to replace advice given to you by your health care provider. Make sure you discuss any questions you have with your health care provider. Document Released: 09/13/2009 Document Revised: 06/06/2016 Document Reviewed: 01/11/2016 Elsevier Interactive Patient Education  2018 Elsevier Inc.  

## 2018-02-15 ENCOUNTER — Ambulatory Visit: Payer: Medicare Other

## 2018-02-16 ENCOUNTER — Ambulatory Visit: Payer: Medicare Other

## 2018-02-17 ENCOUNTER — Ambulatory Visit: Payer: Medicare Other

## 2018-02-18 ENCOUNTER — Encounter: Payer: Self-pay | Admitting: General Practice

## 2018-02-18 ENCOUNTER — Ambulatory Visit: Payer: Medicare Other

## 2018-02-18 NOTE — Progress Notes (Signed)
[place] CSW Progress Notes  Referral ([id number] [id number]) submitted to [employer] for [employer] [facility]).  Edwyna Shell, LCSW Clinical Social Worker Phone:  [phone]

## 2018-02-19 ENCOUNTER — Ambulatory Visit: Payer: Medicare Other

## 2018-02-20 ENCOUNTER — Ambulatory Visit: Payer: Medicare Other

## 2018-02-22 ENCOUNTER — Ambulatory Visit: Payer: Medicare Other

## 2018-02-22 ENCOUNTER — Other Ambulatory Visit: Payer: Self-pay | Admitting: Radiation Oncology

## 2018-02-22 DIAGNOSIS — C801 Malignant (primary) neoplasm, unspecified: Secondary | ICD-10-CM

## 2018-02-22 MED ORDER — PROCHLORPERAZINE MALEATE 10 MG PO TABS: 10.0000 mg | ORAL_TABLET | Freq: Four times a day (QID) | ORAL | 0 refills | Status: DC | PRN

## 2018-02-23 ENCOUNTER — Telehealth: Payer: Self-pay | Admitting: Internal Medicine

## 2018-02-23 ENCOUNTER — Inpatient Hospital Stay: Payer: Medicare Other

## 2018-02-23 ENCOUNTER — Inpatient Hospital Stay (HOSPITAL_BASED_OUTPATIENT_CLINIC_OR_DEPARTMENT_OTHER): Payer: Medicare Other | Admitting: Internal Medicine

## 2018-02-23 ENCOUNTER — Ambulatory Visit: Payer: Medicare Other

## 2018-02-23 ENCOUNTER — Other Ambulatory Visit: Payer: Self-pay | Admitting: Internal Medicine

## 2018-02-23 ENCOUNTER — Encounter: Payer: Self-pay | Admitting: Internal Medicine

## 2018-02-23 VITALS — BP 139/55 | HR 81 | Temp 98.0°F | Resp 17 | Wt 128.5 lb

## 2018-02-23 DIAGNOSIS — I10 Essential (primary) hypertension: Secondary | ICD-10-CM

## 2018-02-23 DIAGNOSIS — C7989 Secondary malignant neoplasm of other specified sites: Secondary | ICD-10-CM

## 2018-02-23 DIAGNOSIS — Z7189 Other specified counseling: Secondary | ICD-10-CM

## 2018-02-23 DIAGNOSIS — C3491 Malignant neoplasm of unspecified part of right bronchus or lung: Secondary | ICD-10-CM

## 2018-02-23 DIAGNOSIS — C801 Malignant (primary) neoplasm, unspecified: Secondary | ICD-10-CM | POA: Diagnosis not present

## 2018-02-23 DIAGNOSIS — Z5112 Encounter for antineoplastic immunotherapy: Secondary | ICD-10-CM

## 2018-02-23 DIAGNOSIS — Z720 Tobacco use: Secondary | ICD-10-CM

## 2018-02-23 DIAGNOSIS — R5382 Chronic fatigue, unspecified: Secondary | ICD-10-CM

## 2018-02-23 DIAGNOSIS — Z5111 Encounter for antineoplastic chemotherapy: Secondary | ICD-10-CM

## 2018-02-23 DIAGNOSIS — C770 Secondary and unspecified malignant neoplasm of lymph nodes of head, face and neck: Secondary | ICD-10-CM

## 2018-02-23 DIAGNOSIS — R221 Localized swelling, mass and lump, neck: Secondary | ICD-10-CM

## 2018-02-23 LAB — CMP (CANCER CENTER ONLY)
ALT: 15 U/L (ref 0–55)
AST: 17 U/L (ref 5–34)
Albumin: 3.3 g/dL — ABNORMAL LOW (ref 3.5–5.0)
Alkaline Phosphatase: 128 U/L (ref 40–150)
Anion gap: 9 (ref 3–11)
BILIRUBIN TOTAL: 0.2 mg/dL (ref 0.2–1.2)
BUN: 14 mg/dL (ref 7–26)
CHLORIDE: 106 mmol/L (ref 98–109)
CO2: 25 mmol/L (ref 22–29)
CREATININE: 0.72 mg/dL (ref 0.60–1.10)
Calcium: 10.7 mg/dL — ABNORMAL HIGH (ref 8.4–10.4)
Glucose, Bld: 104 mg/dL (ref 70–140)
POTASSIUM: 4.6 mmol/L (ref 3.5–5.1)
Sodium: 140 mmol/L (ref 136–145)
TOTAL PROTEIN: 7.8 g/dL (ref 6.4–8.3)

## 2018-02-23 LAB — CBC WITH DIFFERENTIAL (CANCER CENTER ONLY)
BASOS ABS: 0 10*3/uL (ref 0.0–0.1)
Basophils Relative: 0 %
EOS ABS: 0.1 10*3/uL (ref 0.0–0.5)
EOS PCT: 1 %
HCT: 33.7 % — ABNORMAL LOW (ref 34.8–46.6)
HEMOGLOBIN: 11.4 g/dL — AB (ref 11.6–15.9)
LYMPHS ABS: 1.6 10*3/uL (ref 0.9–3.3)
Lymphocytes Relative: 21 %
MCH: 28.3 pg (ref 25.1–34.0)
MCHC: 33.8 g/dL (ref 31.5–36.0)
MCV: 83.9 fL (ref 79.5–101.0)
Monocytes Absolute: 0.9 10*3/uL (ref 0.1–0.9)
Monocytes Relative: 11 %
NEUTROS PCT: 67 %
Neutro Abs: 5 10*3/uL (ref 1.5–6.5)
PLATELETS: 311 10*3/uL (ref 145–400)
RBC: 4.01 MIL/uL (ref 3.70–5.45)
RDW: 13.5 % (ref 11.2–14.5)
WBC Count: 7.5 10*3/uL (ref 3.9–10.3)

## 2018-02-23 NOTE — Progress Notes (Signed)
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  She presented with large central necrotic right lower neck mass in addition to bulky mediastinal and bilateral hilar adenopathy as well as a small hypermetabolic lung lesion diagnosed in February 2019.  PRIOR THERAPY: Palliative radiotherapy to the large necrotic mass of the right neck under the care of Dr. Isidore Moos, completed on February 19, 2018.  CURRENT THERAPY: Palliative systemic chemotherapy with carboplatin for AUC of 5, paclitaxel 175 mg/M2 and Keytruda 200 mg IV every 3 weeks.  First dose 03/03/2018.  INTERVAL HISTORY: Faith [name] 67 y.o. female returns to the clinic today for follow-up visit accompanied by her daughter.  The patient completed a course of palliative radiotherapy to the large right neck mass on February 19, 2018.  The mass looks smaller on today's exam.  She denied having any chest pain but continues to have mild neck and right shoulder pain.  She is currently on treatment with OxyContin and Percocet.  The patient denied having any recent nausea, vomiting, diarrhea or constipation.  She denied having any shortness of breath.  She continues to have cough with no hemoptysis.  She has no fever or chills.  She has no recent weight loss or night sweats.  She is here today for evaluation and discussion of her treatment options.  MEDICAL HISTORY: Past Medical History:  Diagnosis Date  . Anxiety   . Arthritis    "thighs; legs; hips" (07/05/2014)  . Bipolar disorder ([place])   . Cholelithiasis 07/2014  . Chronic bronchitis ([place])    "get it q yr"  . Chronic lower back pain   . Depression    pt. use to go to depression clinic, states she still has depression & anxiety but can't get to appt. so she hasn't had any med. for  it in a while   . Dyspnea   . GERD (gastroesophageal reflux disease)   . High cholesterol   . Hypertension   . Mass in neck 12/2017   RIGHT SIDE OF NECK  . Peripheral vascular disease ([place])   . Schizophrenia ([place])   . Stroke [facility])     ALLERGIES:  is allergic to penicillins; codeine; and percocet [oxycodone-acetaminophen].  MEDICATIONS:  Current Outpatient Medications  Medication Sig Dispense Refill  . albuterol (PROVENTIL HFA;VENTOLIN HFA) 108 (90 Base) MCG/ACT inhaler Inhale 1-2 puffs into the lungs every 6 (six) hours as needed for wheezing or shortness of breath.    Marland Kitchen albuterol (PROVENTIL) (2.5 MG/3ML) 0.083% nebulizer solution Take 2.5 mg by nebulization every 6 (six) hours as needed for wheezing or shortness of breath.    . AMITIZA 24 MCG capsule Take 24 mcg by mouth 2 (two) times daily.  0  . clopidogrel (PLAVIX) 75 MG tablet Take 1 tablet (75 mg total) by mouth daily. 30 tablet 0  . diclofenac sodium (VOLTAREN) 1 % GEL Apply 2 g topically daily as needed (pain).     Marland Kitchen diphenhydrAMINE (BENADRYL) 25 MG tablet Take 2 tablets (50 mg total) by mouth at bedtime as needed for sleep. 20 tablet 0  . fluticasone (FLONASE) 50 MCG/ACT nasal spray Place 2 sprays into both nostrils daily.   0  . furosemide (LASIX) 20 MG tablet Take 20 mg by mouth daily as needed for fluid or edema.     Marland Kitchen  gabapentin (NEURONTIN) 300 MG capsule Take 300 mg by mouth 3 (three) times daily.     Marland Kitchen lidocaine (XYLOCAINE) 2 % solution Caregiver: Mix 1part 2% viscous lidocaine, 1part H20. Swish & swallow 68mL of diluted mixture, 43min before meals and at bedtime, up to QID 100 mL 5  . Linaclotide (LINZESS) 290 MCG CAPS capsule Take 290-580 mcg by mouth daily as needed (constipation).     Marland Kitchen loratadine (CLARITIN) 10 MG tablet Take 10 mg by mouth daily.    . methocarbamol (ROBAXIN) 750 MG tablet Take 750 mg by mouth 2 (two) times daily as needed for muscle spasms.     . mometasone (ELOCON) 0.1 % ointment Apply 1 application  topically daily as needed (irritation).     Marland Kitchen omeprazole (PRILOSEC) 20 MG capsule Take 20 mg by mouth 2 (two) times daily before a meal.    . Oxycodone HCl 20 MG TABS Take 1 tablet by mouth 4 (four) times daily as needed.  0  . polyethylene glycol (MIRALAX / GLYCOLAX) packet Take 17 g by mouth daily. 14 each 0  . potassium chloride 20 MEQ/15ML (10%) SOLN Take 15 mLs by mouth daily.    . Vitamin D, Ergocalciferol, (DRISDOL) 50000 units CAPS capsule Take 50,000 Units by mouth every 7 (seven) days.    Marland Kitchen acetaminophen (TYLENOL) 325 MG tablet Take 2 tablets (650 mg total) by mouth every 6 (six) hours as needed for mild pain (or Fever >/= 101). (Patient not taking: Reported on 02/23/2018)    . aspirin EC 325 MG tablet Take 1 tablet (325 mg total) by mouth daily. (Patient not taking: Reported on 02/23/2018) 30 tablet 0  . oxyCODONE-acetaminophen (PERCOCET) 10-325 MG tablet Take 1 tablet by mouth every 6 (six) hours as needed for pain. (Patient not taking: Reported on 02/23/2018) 30 tablet 0  . prochlorperazine (COMPAZINE) 10 MG tablet Take 1 tablet (10 mg total) by mouth every 6 (six) hours as needed for nausea or vomiting. (Patient not taking: Reported on 02/23/2018) 30 tablet 0   No current facility-administered medications for this visit.     SURGICAL HISTORY:  Past Surgical History:  Procedure Laterality Date  . ABDOMINAL AORTOGRAM N/A 01/07/2017   Procedure: Abdominal Aortogram;  Surgeon: Waynetta Sandy, MD;  Location: [facility];  Service: Cardiovascular;  Laterality: N/A;  . ABDOMINAL HYSTERECTOMY    . ANKLE FRACTURE SURGERY Right   . ANKLE HARDWARE REMOVAL Right   . APPENDECTOMY  ~ 1963  . BREAST BIOPSY Bilateral   . BREAST CYST EXCISION Bilateral    "not cancer"  . CATARACT EXTRACTION W/ INTRAOCULAR LENS  IMPLANT, BILATERAL    . CHOLECYSTECTOMY N/A 07/05/2014   Procedure: LAPAROSCOPIC CHOLECYSTECTOMY WITH INTRAOPERATIVE CHOLANGIOGRAM;  Surgeon: Gayland Curry, MD;  Location: [facility];  Service: General;  Laterality: N/A;  . EXCISIONAL HEMORRHOIDECTOMY    . FEMORAL-POPLITEAL BYPASS GRAFT Right 01/15/2017   Procedure: BYPASS GRAFT FEMORAL-POPLITEAL ARTERY RIGHT LEG;  Surgeon: Waynetta Sandy, MD;  Location: [facility];  Service: Vascular;  Laterality: Right;  . LAPAROSCOPIC CHOLECYSTECTOMY  07/05/2014  . LOWER EXTREMITY ANGIOGRAPHY Bilateral 01/07/2017   Procedure: Lower Extremity Angiography;  Surgeon: Waynetta Sandy, MD;  Location: [facility];  Service: Cardiovascular;  Laterality: Bilateral;  . MASS BIOPSY Right 12/25/2017   Procedure: INCISIONAL RIGHT NECK MASS BIOPSY;  Surgeon: Helayne Seminole, MD;  Location: [facility];  Service: ENT;  Laterality: Right;  . MULTIPLE TOOTH EXTRACTIONS  05/2013   "pulled  my upper teeth"  . PILONIDAL CYST EXCISION      REVIEW OF SYSTEMS:  Constitutional: positive for fatigue Eyes: negative Ears, nose, mouth, throat, and face: negative Respiratory: positive for cough Cardiovascular: negative Gastrointestinal: negative Genitourinary:negative Integument/breast: negative Hematologic/lymphatic: negative Musculoskeletal:positive for neck pain Neurological: negative Behavioral/Psych: negative Endocrine: negative Allergic/Immunologic: negative   PHYSICAL EXAMINATION: General appearance: alert, cooperative, fatigued and no distress Head: Normocephalic, without obvious abnormality, atraumatic Neck: no carotid bruit, no JVD, supple, symmetrical, trachea midline, thyroid not enlarged, symmetric, no tenderness/mass/nodules and Large right neck lymph node with granulation tissue after the radiation. Lymph nodes: Cervical adenopathy: Enlarged right neck lymph node. Resp: clear to auscultation bilaterally Back: symmetric, no curvature. ROM normal. No CVA tenderness. Cardio: regular rate and rhythm, S1, S2 normal, no murmur, click, rub or gallop GI: soft, non-tender; bowel sounds normal; no masses,  no  organomegaly Extremities: extremities normal, atraumatic, no cyanosis or edema Neurologic: Alert and oriented X 3, normal strength and tone. Normal symmetric reflexes. Normal coordination and gait  ECOG PERFORMANCE STATUS: 1 - Symptomatic but completely ambulatory  Blood pressure (!) 139/55, pulse 81, temperature 98 F (36.7 C), temperature source Oral, resp. rate 17, weight 128 lb 8 oz (58.3 kg), SpO2 100 %.  LABORATORY DATA: Lab Results  Component Value Date   WBC 7.5 02/23/2018   HGB 9.9 (L) 12/25/2017   HCT 33.7 (L) 02/23/2018   MCV 83.9 02/23/2018   PLT 311 02/23/2018      Chemistry      Component Value Date/Time   NA 136 02/12/2018 1226   K 3.6 02/12/2018 1226   CL 107 02/12/2018 1226   CO2 23 02/12/2018 1226   BUN 10 02/12/2018 1226   CREATININE 0.78 02/12/2018 1226      Component Value Date/Time   CALCIUM 11.9 (H) 02/12/2018 1226   ALKPHOS 78 12/23/2017 1358   AST 17 12/23/2017 1358   ALT 9 (L) 12/23/2017 1358   BILITOT 0.7 12/23/2017 1358       RADIOGRAPHIC STUDIES: No results found.  ASSESSMENT AND PLAN: This is a very pleasant 67 years old African-American female with metastatic poorly differentiated carcinoma highly suspicious for primary lung cancer versus head and neck cancer.  She is status post palliative radiotherapy to the large right neck mass.   I had a lengthy discussion with the patient today about her current disease of stage, prognosis and treatment options. I explained to the patient that she has an incurable condition and all the treatment will be of palliative nature. She is interested in proceeding with systemic chemotherapy and I recommended for her regimen consisting of carboplatin for AUC of 5, paclitaxel 175 mg/M2 and Keytruda 200 mg IV every 3 weeks.  This will be given for 4 cycles followed by maintenance Keytruda every 3 weeks if there is no evidence for disease progression. I discussed with the patient the adverse effect of this  treatment including but not limited to alopecia, myelosuppression, nausea and vomiting, peripheral neuropathy, liver or renal dysfunction, in addition to adverse effect of the immunotherapy. The patient would like to proceed with the treatment as planned and she is expected to start the first dose of this treatment on 03/10/2018. She will have a chemotherapy education class before the first dose of her treatment. For pain management she will continue with the current pain medication with OxyContin and Percocet. The patient will come back for follow-up visit in 3 weeks for evaluation and management of any adverse effect of her  treatment. She was advised to call immediately if she has any concerning symptoms in the interval. The patient voices understanding of current disease status and treatment options and is in agreement with the current care plan.  All questions were answered. The patient knows to call the clinic with any problems, questions or concerns. We can certainly see the patient much sooner if necessary.  Disclaimer: This note was dictated with voice recognition software. Similar sounding words can inadvertently be transcribed and may not be corrected upon review.

## 2018-02-23 NOTE — Progress Notes (Unsigned)
[place]

## 2018-02-23 NOTE — Progress Notes (Signed)
START ON PATHWAY REGIMEN - Non-Small Cell Lung     A cycle is every 21 days:     Pembrolizumab      Paclitaxel      Carboplatin   **Always confirm dose/schedule in your pharmacy ordering system**    Patient Characteristics: Stage IV Metastatic, Squamous, PS = 0, 1, First Line, PD-L1 Expression Positive 1-49% (TPS) / Negative / Not Tested / Awaiting Test Results AJCC T Category: T1a Current Disease Status: Distant Metastases AJCC N Category: N3 AJCC M Category: M1b AJCC 8 Stage Grouping: IVA Histology: Squamous Cell Line of therapy: First Line PD-L1 Expression Status: Did Not Order Test Performance Status: PS = 0, 1 Would you be surprised if this patient died  in the next year<= I would NOT be surprised if this patient died in the next year Intent of Therapy: Non-Curative / Palliative Intent, Discussed with Patient

## 2018-02-23 NOTE — Telephone Encounter (Signed)
Appointments scheduled AVS/Calendar printed per 3/26 los °

## 2018-02-24 ENCOUNTER — Other Ambulatory Visit: Payer: Medicare Other

## 2018-02-24 ENCOUNTER — Ambulatory Visit: Payer: Medicare Other

## 2018-02-25 ENCOUNTER — Ambulatory Visit: Payer: Medicare Other

## 2018-02-26 ENCOUNTER — Other Ambulatory Visit: Payer: Self-pay | Admitting: Medical Oncology

## 2018-02-26 ENCOUNTER — Ambulatory Visit: Payer: Medicare Other

## 2018-02-26 DIAGNOSIS — C760 Malignant neoplasm of head, face and neck: Secondary | ICD-10-CM

## 2018-02-26 NOTE — Progress Notes (Signed)
Order placed for PT to evaluate and tx right arm.

## 2018-03-01 ENCOUNTER — Ambulatory Visit: Payer: Medicare Other | Attending: Radiation Oncology

## 2018-03-01 ENCOUNTER — Ambulatory Visit: Payer: Medicare Other

## 2018-03-01 DIAGNOSIS — R1312 Dysphagia, oropharyngeal phase: Secondary | ICD-10-CM | POA: Insufficient documentation

## 2018-03-01 NOTE — Therapy (Signed)
[facility] [address] [place], [place], [address] Phone: [phone]   Fax:  [phone]  Patient Details  Name: Faith [name] MRN: [id number] Date of Birth: Nov 22, 1951 Referring Provider:  Eppie Gibson, MD  Encounter Date: 03/01/2018  ST - Arrive/Cancel  Pt arrived at 11:22 for 11:00 appointment. SLP did not have sufficient time for appointment today. SLP will have scheduler call pt daughter (which pt said was preferable) to arrange next f/u with [place] [facility], [place]  03/01/2018, 11:36 AM  [facility] [address] [place], [place], [address] Phone: [phone]   Fax:  [phone]

## 2018-03-02 ENCOUNTER — Inpatient Hospital Stay: Payer: Medicare Other | Attending: Internal Medicine

## 2018-03-02 ENCOUNTER — Ambulatory Visit: Payer: Medicare Other

## 2018-03-02 DIAGNOSIS — Z5111 Encounter for antineoplastic chemotherapy: Secondary | ICD-10-CM | POA: Insufficient documentation

## 2018-03-02 DIAGNOSIS — E785 Hyperlipidemia, unspecified: Secondary | ICD-10-CM | POA: Insufficient documentation

## 2018-03-02 DIAGNOSIS — I10 Essential (primary) hypertension: Secondary | ICD-10-CM | POA: Insufficient documentation

## 2018-03-02 DIAGNOSIS — Z5189 Encounter for other specified aftercare: Secondary | ICD-10-CM | POA: Insufficient documentation

## 2018-03-02 DIAGNOSIS — Z5112 Encounter for antineoplastic immunotherapy: Secondary | ICD-10-CM | POA: Insufficient documentation

## 2018-03-02 DIAGNOSIS — C7989 Secondary malignant neoplasm of other specified sites: Secondary | ICD-10-CM | POA: Insufficient documentation

## 2018-03-02 DIAGNOSIS — M545 Low back pain: Secondary | ICD-10-CM | POA: Insufficient documentation

## 2018-03-02 DIAGNOSIS — C801 Malignant (primary) neoplasm, unspecified: Secondary | ICD-10-CM | POA: Insufficient documentation

## 2018-03-02 DIAGNOSIS — Z8673 Personal history of transient ischemic attack (TIA), and cerebral infarction without residual deficits: Secondary | ICD-10-CM | POA: Insufficient documentation

## 2018-03-02 DIAGNOSIS — G8929 Other chronic pain: Secondary | ICD-10-CM | POA: Insufficient documentation

## 2018-03-03 ENCOUNTER — Ambulatory Visit: Payer: Medicare Other

## 2018-03-04 ENCOUNTER — Other Ambulatory Visit: Payer: Self-pay | Admitting: Student

## 2018-03-04 ENCOUNTER — Ambulatory Visit: Payer: Medicare Other

## 2018-03-05 ENCOUNTER — Other Ambulatory Visit: Payer: Self-pay | Admitting: Radiology

## 2018-03-05 ENCOUNTER — Ambulatory Visit: Payer: Medicare Other

## 2018-03-07 ENCOUNTER — Other Ambulatory Visit: Payer: Self-pay | Admitting: Radiology

## 2018-03-08 ENCOUNTER — Ambulatory Visit: Payer: Medicare Other

## 2018-03-08 ENCOUNTER — Ambulatory Visit (HOSPITAL_COMMUNITY)
Admission: RE | Admit: 2018-03-08 | Discharge: 2018-03-08 | Disposition: A | Discharge status: Home / Self Care | Payer: Medicare Other | Source: Ambulatory Visit | Attending: Internal Medicine | Admitting: Internal Medicine

## 2018-03-08 ENCOUNTER — Encounter (HOSPITAL_COMMUNITY): Payer: Self-pay

## 2018-03-08 ENCOUNTER — Other Ambulatory Visit: Payer: Self-pay | Admitting: Internal Medicine

## 2018-03-08 DIAGNOSIS — Z800 Family history of malignant neoplasm of digestive organs: Secondary | ICD-10-CM | POA: Insufficient documentation

## 2018-03-08 DIAGNOSIS — Z95820 Peripheral vascular angioplasty status with implants and grafts: Secondary | ICD-10-CM | POA: Diagnosis not present

## 2018-03-08 DIAGNOSIS — Z8673 Personal history of transient ischemic attack (TIA), and cerebral infarction without residual deficits: Secondary | ICD-10-CM | POA: Diagnosis not present

## 2018-03-08 DIAGNOSIS — C3491 Malignant neoplasm of unspecified part of right bronchus or lung: Secondary | ICD-10-CM | POA: Insufficient documentation

## 2018-03-08 DIAGNOSIS — Z79899 Other long term (current) drug therapy: Secondary | ICD-10-CM | POA: Insufficient documentation

## 2018-03-08 DIAGNOSIS — E7800 Pure hypercholesterolemia, unspecified: Secondary | ICD-10-CM | POA: Diagnosis not present

## 2018-03-08 DIAGNOSIS — I10 Essential (primary) hypertension: Secondary | ICD-10-CM | POA: Insufficient documentation

## 2018-03-08 DIAGNOSIS — K219 Gastro-esophageal reflux disease without esophagitis: Secondary | ICD-10-CM | POA: Diagnosis not present

## 2018-03-08 DIAGNOSIS — M1990 Unspecified osteoarthritis, unspecified site: Secondary | ICD-10-CM | POA: Insufficient documentation

## 2018-03-08 DIAGNOSIS — Z9842 Cataract extraction status, left eye: Secondary | ICD-10-CM | POA: Insufficient documentation

## 2018-03-08 DIAGNOSIS — Z7902 Long term (current) use of antithrombotics/antiplatelets: Secondary | ICD-10-CM | POA: Diagnosis not present

## 2018-03-08 DIAGNOSIS — Z87891 Personal history of nicotine dependence: Secondary | ICD-10-CM | POA: Insufficient documentation

## 2018-03-08 DIAGNOSIS — Z9841 Cataract extraction status, right eye: Secondary | ICD-10-CM | POA: Diagnosis not present

## 2018-03-08 DIAGNOSIS — Z9049 Acquired absence of other specified parts of digestive tract: Secondary | ICD-10-CM | POA: Diagnosis not present

## 2018-03-08 DIAGNOSIS — Z833 Family history of diabetes mellitus: Secondary | ICD-10-CM | POA: Diagnosis not present

## 2018-03-08 DIAGNOSIS — Z885 Allergy status to narcotic agent status: Secondary | ICD-10-CM | POA: Insufficient documentation

## 2018-03-08 DIAGNOSIS — Z90710 Acquired absence of both cervix and uterus: Secondary | ICD-10-CM | POA: Diagnosis not present

## 2018-03-08 DIAGNOSIS — Z8249 Family history of ischemic heart disease and other diseases of the circulatory system: Secondary | ICD-10-CM | POA: Insufficient documentation

## 2018-03-08 DIAGNOSIS — Z809 Family history of malignant neoplasm, unspecified: Secondary | ICD-10-CM | POA: Insufficient documentation

## 2018-03-08 DIAGNOSIS — I739 Peripheral vascular disease, unspecified: Secondary | ICD-10-CM | POA: Diagnosis not present

## 2018-03-08 DIAGNOSIS — Z880 Allergy status to penicillin: Secondary | ICD-10-CM | POA: Insufficient documentation

## 2018-03-08 DIAGNOSIS — Z9889 Other specified postprocedural states: Secondary | ICD-10-CM | POA: Insufficient documentation

## 2018-03-08 DIAGNOSIS — Z7982 Long term (current) use of aspirin: Secondary | ICD-10-CM | POA: Diagnosis not present

## 2018-03-08 HISTORY — PX: IR IMAGING GUIDED PORT INSERTION: IMG5740

## 2018-03-08 HISTORY — PX: IR US GUIDE VASC ACCESS LEFT: IMG2389

## 2018-03-08 LAB — CBC
HCT: 37.3 % (ref 36.0–46.0)
HEMOGLOBIN: 12.5 g/dL (ref 12.0–15.0)
MCH: 28.2 pg (ref 26.0–34.0)
MCHC: 33.5 g/dL (ref 30.0–36.0)
MCV: 84 fL (ref 78.0–100.0)
PLATELETS: 271 10*3/uL (ref 150–400)
RBC: 4.44 MIL/uL (ref 3.87–5.11)
RDW: 15.1 % (ref 11.5–15.5)
WBC: 8.5 10*3/uL (ref 4.0–10.5)

## 2018-03-08 LAB — PROTIME-INR
INR: 1.07
PROTHROMBIN TIME: 13.8 s (ref 11.4–15.2)

## 2018-03-08 LAB — APTT: APTT: 29 s (ref 24–36)

## 2018-03-08 MED ORDER — FENTANYL CITRATE (PF) 100 MCG/2ML IJ SOLN
INTRAMUSCULAR | Status: AC
  Filled 2018-03-08: qty 4

## 2018-03-08 MED ORDER — LIDOCAINE HCL 1 % IJ SOLN
INTRAMUSCULAR | Status: AC
  Filled 2018-03-08: qty 20

## 2018-03-08 MED ORDER — MIDAZOLAM HCL 2 MG/2ML IJ SOLN
INTRAMUSCULAR | Status: AC
  Filled 2018-03-08: qty 4

## 2018-03-08 MED ORDER — FENTANYL CITRATE (PF) 100 MCG/2ML IJ SOLN
INTRAMUSCULAR | Status: AC | PRN
  Administered 2018-03-08 (×2): 50 ug via INTRAVENOUS
  Administered 2018-03-08 (×2): 25 ug via INTRAVENOUS
  Administered 2018-03-08: 50 ug via INTRAVENOUS

## 2018-03-08 MED ORDER — LIDOCAINE-EPINEPHRINE (PF) 2 %-1:200000 IJ SOLN
INTRAMUSCULAR | Status: AC
  Filled 2018-03-08: qty 20

## 2018-03-08 MED ORDER — VANCOMYCIN HCL IN DEXTROSE 1-5 GM/200ML-% IV SOLN
INTRAVENOUS | Status: AC
  Filled 2018-03-08: qty 200

## 2018-03-08 MED ORDER — VANCOMYCIN HCL IN DEXTROSE 1-5 GM/200ML-% IV SOLN
1000.0000 mg | INTRAVENOUS | Status: AC
  Administered 2018-03-08: 1000 mg via INTRAVENOUS

## 2018-03-08 MED ORDER — HEPARIN SOD (PORK) LOCK FLUSH 100 UNIT/ML IV SOLN
INTRAVENOUS | Status: AC | PRN
  Administered 2018-03-08: 500 [IU] via INTRAVENOUS

## 2018-03-08 MED ORDER — HEPARIN SOD (PORK) LOCK FLUSH 100 UNIT/ML IV SOLN
INTRAVENOUS | Status: AC
  Filled 2018-03-08: qty 5

## 2018-03-08 MED ORDER — SODIUM CHLORIDE 0.9 % IV SOLN
INTRAVENOUS | Status: DC
  Administered 2018-03-08: 13:00:00 via INTRAVENOUS

## 2018-03-08 MED ORDER — MIDAZOLAM HCL 2 MG/2ML IJ SOLN
INTRAMUSCULAR | Status: AC | PRN
  Administered 2018-03-08 (×2): 1 mg via INTRAVENOUS
  Administered 2018-03-08 (×2): 0.5 mg via INTRAVENOUS
  Administered 2018-03-08: 1 mg via INTRAVENOUS

## 2018-03-08 NOTE — Discharge Instructions (Signed)
PLEASE RESUME PLAVIX TOMORROW 03/09/18 PER DR. Anselm Pancoast.  Moderate Conscious Sedation, Adult, Care After These instructions provide you with information about caring for yourself after your procedure. Your health care provider may also give you more specific instructions. Your treatment has been planned according to current medical practices, but problems sometimes occur. Call your health care provider if you have any problems or questions after your procedure. What can I expect after the procedure? After your procedure, it is common:  To feel sleepy for several hours.  To feel clumsy and have poor balance for several hours.  To have poor judgment for several hours.  To vomit if you eat too soon.  Follow these instructions at home: For at least 24 hours after the procedure:   Do not: ? Participate in activities where you could fall or become injured. ? Drive. ? Use heavy machinery. ? Drink alcohol. ? Take sleeping pills or medicines that cause drowsiness. ? Make important decisions or sign legal documents. ? Take care of children on your own.  Rest. Eating and drinking  Follow the diet recommended by your health care provider.  If you vomit: ? Drink water, juice, or soup when you can drink without vomiting. ? Make sure you have little or no nausea before eating solid foods. General instructions  Have a responsible adult stay with you until you are awake and alert.  Take over-the-counter and prescription medicines only as told by your health care provider.  If you smoke, do not smoke without supervision.  Keep all follow-up visits as told by your health care provider. This is important. Contact a health care provider if:  You keep feeling nauseous or you keep vomiting.  You feel light-headed.  You develop a rash.  You have a fever. Get help right away if:  You have trouble breathing. This information is not intended to replace advice given to you by your health care  provider. Make sure you discuss any questions you have with your health care provider. Document Released: 09/07/2013 Document Revised: 04/21/2016 Document Reviewed: 03/08/2016 Elsevier Interactive Patient Education  2018 [place] Insertion, Care After This sheet gives you information about how to care for yourself after your procedure. Your health care provider may also give you more specific instructions. If you have problems or questions, contact your health care provider. What can I expect after the procedure? After your procedure, it is common to have:  Discomfort at the port insertion site.  Bruising on the skin over the port. This should improve over 3-4 days.  Follow these instructions at home: [facility] care  After your port is placed, you will get a manufacturer's information card. The card has information about your port. Keep this card with you at all times.  Take care of the port as told by your health care provider. Ask your health care provider if you or a family member can get training for taking care of the port at home. A home health care nurse may also take care of the port.  Make sure to remember what type of port you have. Incision care  Follow instructions from your health care provider about how to take care of your port insertion site. Make sure you: ? Wash your hands with soap and water before you change your bandage (dressing). If soap and water are not available, use hand sanitizer. ? Change your dressing as told by your health care provider.  You may remove your dressing tomorrow. ?  Leave skin glue in place. These skin closures may need to stay in place for 2 weeks or longer. If adhesive strip edges start to loosen and curl up, you may trim the loose edges. Do not remove adhesive strips completely unless your health care provider tells you to do that.  DO NOT use EMLA cream for 2 weeks after port placement as this cream will remove surgical glue  on your incision.  Check your port insertion site every day for signs of infection. Check for: ? More redness, swelling, or pain. ? More fluid or blood. ? Warmth. ? Pus or a bad smell. General instructions  Do not take baths, swim, or use a hot tub until your health care provider approves.  You may shower tomorrow.  Do not lift anything that is heavier than 10 lb (4.5 kg) for a week, or as told by your health care provider.  Ask your health care provider when it is okay to: ? Return to work or school. ? Resume usual physical activities or sports.  Do not drive for 24 hours if you were given a medicine to help you relax (sedative).  Take over-the-counter and prescription medicines only as told by your health care provider.  Wear a medical alert bracelet in case of an emergency. This will tell any health care providers that you have a port.  Keep all follow-up visits as told by your health care provider. This is important. Contact a health care provider if:  You have a fever or chills.  You have more redness, swelling, or pain around your port insertion site.  You have more fluid or blood coming from your port insertion site.  Your port insertion site feels warm to the touch.  You have pus or a bad smell coming from the port insertion site. Get help right away if:  You have chest pain or shortness of breath.  You have bleeding from your port that you cannot control. Summary  Take care of the port as told by your health care provider.  Change your dressing as told by your health care provider.  Keep all follow-up visits as told by your health care provider. This information is not intended to replace advice given to you by your health care provider. Make sure you discuss any questions you have with your health care provider. Document Released: 09/07/2013 Document Revised: 10/08/2016 Document Reviewed: 10/08/2016 Elsevier Interactive Patient Education  2017 [employer].

## 2018-03-08 NOTE — Procedures (Signed)
Placement of left IJ port.  Tip at SVC/RA junction.  Minimal blood loss and no immediate complication.

## 2018-03-08 NOTE — H&P (Addendum)
Chief Complaint: Patient was seen in consultation today for port placement at the request of [facility]  Referring Physician(s): Mohamed,Mohamed  Supervising Physician: Markus Daft  Patient Status: [facility] - Out-pt  History of Present Illness: Faith [name] is a 67 y.o. female being treated for head and neck cancer. She has completed radiation to a large complex of adenopathy in the right neck. She is to start chemotherapy this week and is referred for port placement. PMHx, meds, labs, imaging, allergies reviewed. Has held Plavix since 4/2 as directed. Feels well, no recent fevers, chills, illness. Has been NPO today as directed. Family at bedside.   Past Medical History:  Diagnosis Date  . Anxiety   . Arthritis    "thighs; legs; hips" (07/05/2014)  . Bipolar disorder ([place])   . Cholelithiasis 07/2014  . Chronic bronchitis ([place])    "get it q yr"  . Chronic lower back pain   . Depression    pt. use to go to depression clinic, states she still has depression & anxiety but can't get to appt. so she hasn't had any med. for it in a while   . Dyspnea   . GERD (gastroesophageal reflux disease)   . High cholesterol   . Hypertension   . Mass in neck 12/2017   RIGHT SIDE OF NECK  . Peripheral vascular disease ([place])   . Schizophrenia ([place])   . Stroke [facility])     Past Surgical History:  Procedure Laterality Date  . ABDOMINAL AORTOGRAM N/A 01/07/2017   Procedure: Abdominal Aortogram;  Surgeon: Waynetta Sandy, MD;  Location: [place] [facility];  Service: Cardiovascular;  Laterality: N/A;  . ABDOMINAL HYSTERECTOMY    . ANKLE FRACTURE SURGERY Right   . ANKLE HARDWARE REMOVAL Right   . APPENDECTOMY  ~ 1963  . BREAST BIOPSY Bilateral   . BREAST CYST EXCISION Bilateral    "not cancer"  . CATARACT EXTRACTION W/ INTRAOCULAR LENS  IMPLANT, BILATERAL    . CHOLECYSTECTOMY N/A 07/05/2014   Procedure: LAPAROSCOPIC CHOLECYSTECTOMY WITH INTRAOPERATIVE CHOLANGIOGRAM;  Surgeon: Gayland Curry, MD;  Location: [facility];  Service: General;  Laterality: N/A;  . EXCISIONAL HEMORRHOIDECTOMY    . FEMORAL-POPLITEAL BYPASS GRAFT Right 01/15/2017   Procedure: BYPASS GRAFT FEMORAL-POPLITEAL ARTERY RIGHT LEG;  Surgeon: Waynetta Sandy, MD;  Location: [place];  Service: Vascular;  Laterality: Right;  . LAPAROSCOPIC CHOLECYSTECTOMY  07/05/2014  . LOWER EXTREMITY ANGIOGRAPHY Bilateral 01/07/2017   Procedure: Lower Extremity Angiography;  Surgeon: Waynetta Sandy, MD;  Location: [place] [facility];  Service: Cardiovascular;  Laterality: Bilateral;  . MASS BIOPSY Right 12/25/2017   Procedure: INCISIONAL RIGHT NECK MASS BIOPSY;  Surgeon: Helayne Seminole, MD;  Location: [place];  Service: ENT;  Laterality: Right;  . MULTIPLE TOOTH EXTRACTIONS  05/2013   "pulled my upper teeth"  . PILONIDAL CYST EXCISION      Allergies: Penicillins; Codeine; and Percocet [oxycodone-acetaminophen]  Medications: Prior to Admission medications   Medication Sig Start Date End Date Taking? Authorizing Provider  albuterol (PROVENTIL HFA;VENTOLIN HFA) 108 (90 Base) MCG/ACT inhaler Inhale 1-2 puffs into the lungs every 6 (six) hours as needed for wheezing or shortness of breath.   Yes [provider]  diclofenac sodium (VOLTAREN) 1 % GEL Apply 2 g topically daily as needed (pain).    Yes [provider]  fluticasone (FLONASE) 50 MCG/ACT nasal spray Place 2 sprays into both nostrils daily.  09/10/15  Yes [provider]  furosemide (LASIX)  20 MG tablet Take 20 mg by mouth daily as needed for fluid or edema.    Yes [provider]  lidocaine (XYLOCAINE) 2 % solution Caregiver: Mix 1part 2% viscous lidocaine, 1part H20. Swish & swallow 39mL of diluted mixture, 50min before meals and at bedtime, up to QID 02/12/18  Yes Eppie Gibson, MD  Linaclotide [facility]) 290 MCG CAPS capsule Take 290-580 mcg by mouth daily as needed (constipation).    Yes [provider]    loratadine (CLARITIN) 10 MG tablet Take 10 mg by mouth daily.   Yes [provider]  methocarbamol (ROBAXIN) 750 MG tablet Take 750 mg by mouth 2 (two) times daily as needed for muscle spasms.    Yes [provider]  omeprazole (PRILOSEC) 20 MG capsule Take 20 mg by mouth 2 (two) times daily before a meal.   Yes [provider]  Oxycodone HCl 20 MG TABS Take 1 tablet by mouth 4 (four) times daily as needed. 02/03/18  Yes [provider]  polyethylene glycol (MIRALAX / GLYCOLAX) packet Take 17 g by mouth daily. 02/11/18  Yes Gery Pray, MD  potassium chloride 20 MEQ/15ML (10%) SOLN Take 15 mLs by mouth daily. 10/30/17  Yes [provider]  acetaminophen (TYLENOL) 325 MG tablet Take 2 tablets (650 mg total) by mouth every 6 (six) hours as needed for mild pain (or Fever >/= 101). Patient not taking: Reported on 02/23/2018 12/25/17   Rosita Fire, MD  albuterol (PROVENTIL) (2.5 MG/3ML) 0.083% nebulizer solution Take 2.5 mg by nebulization every 6 (six) hours as needed for wheezing or shortness of breath.    [provider]  AMITIZA 24 MCG capsule Take 24 mcg by mouth 2 (two) times daily. 12/07/17   [provider]  aspirin EC 325 MG tablet Take 1 tablet (325 mg total) by mouth daily. Patient not taking: Reported on 02/23/2018 02/18/17   Modena Jansky, MD  clopidogrel (PLAVIX) 75 MG tablet Take 1 tablet (75 mg total) by mouth daily. 02/19/17   Hongalgi, Lenis Dickinson, MD  diphenhydrAMINE (BENADRYL) 25 MG tablet Take 2 tablets (50 mg total) by mouth at bedtime as needed for sleep. 09/11/15   Evelina Bucy, MD  gabapentin (NEURONTIN) 300 MG capsule Take 300 mg by mouth 3 (three) times daily.     [provider]  mometasone (ELOCON) 0.1 % ointment Apply 1 application topically daily as needed (irritation).     [provider]  oxyCODONE-acetaminophen (PERCOCET) 10-325 MG tablet Take 1 tablet by mouth every 6 (six) hours as  needed for pain. Patient not taking: Reported on 02/23/2018 01/16/17   Virgina Jock A, PA-C  prochlorperazine (COMPAZINE) 10 MG tablet Take 1 tablet (10 mg total) by mouth every 6 (six) hours as needed for nausea or vomiting. Patient not taking: Reported on 02/23/2018 02/22/18   Eppie Gibson, MD  Vitamin D, Ergocalciferol, (DRISDOL) 50000 units CAPS capsule Take 50,000 Units by mouth every 7 (seven) days.    [provider]     Family History  Problem Relation Age of Onset  . Cancer Mother 59       colon  . Diabetes Mother   . Hypertension Mother   . Cancer Father   . Diabetes Father   . Hypertension Father     Social History   Socioeconomic History  . Marital status: Single    Spouse name: Not on file  . Number of children: Not on file  . Years of education:  Not on file  . Highest education level: Not on file  Occupational History  . Not on file  Social Needs  . Financial resource strain: Not on file  . Food insecurity:    Worry: Not on file    Inability: Not on file  . Transportation needs:    Medical: Not on file    Non-medical: Not on file  Tobacco Use  . Smoking status: Former Smoker    Packs/day: 1.00    Years: 48.00    Pack years: 48.00    Types: Cigarettes    Last attempt to quit: 01/28/2018    Years since quitting: 0.1  . Smokeless tobacco: Never Used  . Tobacco comment: Occasional.   Substance and Sexual Activity  . Alcohol use: No    Frequency: Never  . Drug use: No    Comment: 15-20 yrs. ago- used marijuana   . Sexual activity: Yes    Birth control/protection: Post-menopausal  Lifestyle  . Physical activity:    Days per week: Not on file    Minutes per session: Not on file  . Stress: Not on file  Relationships  . Social connections:    Talks on phone: Not on file    Gets together: Not on file    Attends religious service: Not on file    Active member of club or organization: Not on file    Attends meetings of clubs or organizations:  Not on file    Relationship status: Not on file  Other Topics Concern  . Not on file  Social History Narrative  . Not on file    Review of Systems: A 12 point ROS discussed and pertinent positives are indicated in the HPI above.  All other systems are negative.  Review of Systems  Vital Signs: T: 98.5, HR: 94, BP: 142/98, RR: 18  Physical Exam  Constitutional: She is oriented to person, place, and time. She appears well-developed. No distress.  HENT:  Head: Normocephalic.  Mouth/Throat: Oropharynx is clear and moist.  Neck: Normal range of motion.  (R)neck with enlarged adenopathy and post radiation skin changes including skin erosion. Wound is clean, no purulence or odor.  Cardiovascular: Normal rate, regular rhythm and normal heart sounds.  Pulmonary/Chest: Effort normal and breath sounds normal. No respiratory distress.  Neurological: She is alert and oriented to person, place, and time.  Skin: Skin is warm and dry.  Psychiatric: She has a normal mood and affect.    Imaging: No results found.  Labs:  CBC: Recent Labs    12/22/17 1613 12/25/17 0611 02/12/18 1226 02/23/18 1505 03/08/18 1325  WBC 9.7 9.0 7.0 7.5 8.5  HGB 12.8 9.9*  --   --  12.5  HCT 36.2 28.8* 35.2 33.7* 37.3  PLT 203 152 225 311 271    COAGS: Recent Labs    03/08/18 1325  INR 1.07  APTT 29    BMP: Recent Labs    12/25/17 0611 02/09/18 1507 02/12/18 1226 02/23/18 1505  NA 143 137 136 140  K 4.5 3.9 3.6 4.6  CL 123* 109 107 106  CO2 15* 22 23 25   GLUCOSE 83 107 114 104  BUN 9 13 10 14   CALCIUM 10.4* 12.4* 11.9* 10.7*  CREATININE 0.90 0.83 0.78 0.72  GFRNONAA >60 >60 >60 >60  GFRAA >60 >60 >60 >60    LIVER FUNCTION TESTS: Recent Labs    12/23/17 1358 02/23/18 1505  BILITOT 0.7 0.2  AST 17 17  ALT  9* 15  ALKPHOS 78 128  PROT 5.1* 7.8  ALBUMIN 2.7* 3.3*    TUMOR MARKERS: No results for input(s): AFPTM, CEA, CA199, CHROMGRNA in the last 8760 hours.  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 Plan for port placement, (L)sided. Labs ok Risks and benefits of image guided port-a-catheter placement was discussed with the patient including, but not limited to bleeding, infection, pneumothorax, or fibrin sheath development and need for additional procedures.  All of the patient's questions were answered, patient is agreeable to proceed. Consent signed and in chart.    Thank you for this interesting consult.  I greatly enjoyed meeting Recie A [name] and look forward to participating in their care.  A copy of this report was sent to the requesting provider on this date.  Electronically Signed: Ascencion Dike, PA-C 03/08/2018, 2:11 PM   I spent a total of 20 Minutes  in face to face in clinical consultation, greater than 50% of which was counseling/coordinating care for port placement

## 2018-03-08 NOTE — Sedation Documentation (Signed)
MD suturing port pocket at this time

## 2018-03-09 ENCOUNTER — Ambulatory Visit: Payer: Medicare Other

## 2018-03-10 ENCOUNTER — Inpatient Hospital Stay: Payer: Medicare Other

## 2018-03-10 ENCOUNTER — Ambulatory Visit: Payer: Medicare Other | Admitting: Oncology

## 2018-03-10 ENCOUNTER — Ambulatory Visit: Payer: Medicare Other

## 2018-03-10 ENCOUNTER — Other Ambulatory Visit: Payer: Medicare Other

## 2018-03-10 VITALS — BP 148/83 | HR 73 | Temp 98.4°F | Resp 17 | Ht 61.0 in | Wt 125.8 lb

## 2018-03-10 DIAGNOSIS — Z5112 Encounter for antineoplastic immunotherapy: Secondary | ICD-10-CM | POA: Diagnosis present

## 2018-03-10 DIAGNOSIS — M545 Low back pain: Secondary | ICD-10-CM | POA: Diagnosis not present

## 2018-03-10 DIAGNOSIS — C801 Malignant (primary) neoplasm, unspecified: Secondary | ICD-10-CM | POA: Diagnosis not present

## 2018-03-10 DIAGNOSIS — G8929 Other chronic pain: Secondary | ICD-10-CM | POA: Diagnosis not present

## 2018-03-10 DIAGNOSIS — Z8673 Personal history of transient ischemic attack (TIA), and cerebral infarction without residual deficits: Secondary | ICD-10-CM | POA: Diagnosis not present

## 2018-03-10 DIAGNOSIS — I10 Essential (primary) hypertension: Secondary | ICD-10-CM | POA: Diagnosis not present

## 2018-03-10 DIAGNOSIS — E785 Hyperlipidemia, unspecified: Secondary | ICD-10-CM | POA: Diagnosis not present

## 2018-03-10 DIAGNOSIS — C3491 Malignant neoplasm of unspecified part of right bronchus or lung: Secondary | ICD-10-CM

## 2018-03-10 DIAGNOSIS — R5382 Chronic fatigue, unspecified: Secondary | ICD-10-CM

## 2018-03-10 DIAGNOSIS — C7989 Secondary malignant neoplasm of other specified sites: Secondary | ICD-10-CM | POA: Diagnosis present

## 2018-03-10 DIAGNOSIS — Z5189 Encounter for other specified aftercare: Secondary | ICD-10-CM | POA: Diagnosis not present

## 2018-03-10 DIAGNOSIS — Z5111 Encounter for antineoplastic chemotherapy: Secondary | ICD-10-CM | POA: Diagnosis present

## 2018-03-10 LAB — CMP (CANCER CENTER ONLY)
ALBUMIN: 3.4 g/dL — AB (ref 3.5–5.0)
ALT: 15 U/L (ref 0–55)
AST: 16 U/L (ref 5–34)
Alkaline Phosphatase: 136 U/L (ref 40–150)
Anion gap: 8 (ref 3–11)
BUN: 8 mg/dL (ref 7–26)
CHLORIDE: 112 mmol/L — AB (ref 98–109)
CO2: 19 mmol/L — ABNORMAL LOW (ref 22–29)
Calcium: 10.6 mg/dL — ABNORMAL HIGH (ref 8.4–10.4)
Creatinine: 0.67 mg/dL (ref 0.60–1.10)
GFR, Est AFR Am: >60 mL/min (ref >60)
GFR, Estimated: >60 mL/min (ref >60)
GLUCOSE: 134 mg/dL (ref 70–140)
POTASSIUM: 3.8 mmol/L (ref 3.5–5.1)
Sodium: 139 mmol/L (ref 136–145)
Total Bilirubin: 0.4 mg/dL (ref 0.2–1.2)
Total Protein: 7.9 g/dL (ref 6.4–8.3)

## 2018-03-10 LAB — CBC WITH DIFFERENTIAL (CANCER CENTER ONLY)
Basophils Absolute: 0 10*3/uL (ref 0.0–0.1)
Basophils Relative: 0 %
EOS PCT: 1 %
Eosinophils Absolute: 0 10*3/uL (ref 0.0–0.5)
HCT: 41.3 % (ref 34.8–46.6)
Hemoglobin: 13.6 g/dL (ref 11.6–15.9)
LYMPHS ABS: 1.2 10*3/uL (ref 0.9–3.3)
LYMPHS PCT: 15 %
MCH: 28 pg (ref 25.1–34.0)
MCHC: 32.9 g/dL (ref 31.5–36.0)
MCV: 85.2 fL (ref 79.5–101.0)
MONO ABS: 0.6 10*3/uL (ref 0.1–0.9)
MONOS PCT: 7 %
Neutro Abs: 6.5 10*3/uL (ref 1.5–6.5)
Neutrophils Relative %: 77 %
PLATELETS: 235 10*3/uL (ref 145–400)
RBC: 4.84 MIL/uL (ref 3.70–5.45)
RDW: 15 % — AB (ref 11.2–14.5)
WBC Count: 8.3 10*3/uL (ref 3.9–10.3)

## 2018-03-10 LAB — TSH: TSH: 0.605 u[IU]/mL (ref 0.308–3.960)

## 2018-03-10 MED ORDER — DIPHENHYDRAMINE HCL 50 MG/ML IJ SOLN
50.0000 mg | Freq: Once | INTRAMUSCULAR | Status: AC
  Administered 2018-03-10: 50 mg via INTRAVENOUS

## 2018-03-10 MED ORDER — FAMOTIDINE IN NACL 20-0.9 MG/50ML-% IV SOLN
20.0000 mg | Freq: Once | INTRAVENOUS | Status: AC
  Administered 2018-03-10: 20 mg via INTRAVENOUS

## 2018-03-10 MED ORDER — SODIUM CHLORIDE 0.9% FLUSH
10.0000 mL | INTRAVENOUS | Status: DC | PRN
  Administered 2018-03-10: 10 mL
  Filled 2018-03-10: qty 10

## 2018-03-10 MED ORDER — SODIUM CHLORIDE 0.9 % IV SOLN
Freq: Once | INTRAVENOUS | Status: AC
  Administered 2018-03-10: 11:00:00 via INTRAVENOUS

## 2018-03-10 MED ORDER — SODIUM CHLORIDE 0.9 % IV SOLN
175.0000 mg/m2 | Freq: Once | INTRAVENOUS | Status: AC
  Administered 2018-03-10: 276 mg via INTRAVENOUS
  Filled 2018-03-10: qty 46

## 2018-03-10 MED ORDER — PALONOSETRON HCL INJECTION 0.25 MG/5ML
INTRAVENOUS | Status: AC
  Filled 2018-03-10: qty 5

## 2018-03-10 MED ORDER — DIPHENHYDRAMINE HCL 50 MG/ML IJ SOLN
INTRAMUSCULAR | Status: AC
  Filled 2018-03-10: qty 1

## 2018-03-10 MED ORDER — PEMBROLIZUMAB CHEMO INJECTION 100 MG/4ML
200.0000 mg | Freq: Once | INTRAVENOUS | Status: AC
  Administered 2018-03-10: 200 mg via INTRAVENOUS
  Filled 2018-03-10: qty 8

## 2018-03-10 MED ORDER — HEPARIN SOD (PORK) LOCK FLUSH 100 UNIT/ML IV SOLN
500.0000 [IU] | Freq: Once | INTRAVENOUS | Status: AC | PRN
  Administered 2018-03-10: 500 [IU]
  Filled 2018-03-10: qty 5

## 2018-03-10 MED ORDER — SODIUM CHLORIDE 0.9 % IV SOLN
383.0000 mg | Freq: Once | INTRAVENOUS | Status: AC
  Administered 2018-03-10: 380 mg via INTRAVENOUS
  Filled 2018-03-10: qty 38

## 2018-03-10 MED ORDER — PALONOSETRON HCL INJECTION 0.25 MG/5ML
0.2500 mg | Freq: Once | INTRAVENOUS | Status: AC
  Administered 2018-03-10: 0.25 mg via INTRAVENOUS

## 2018-03-10 MED ORDER — FAMOTIDINE IN NACL 20-0.9 MG/50ML-% IV SOLN
INTRAVENOUS | Status: AC
  Filled 2018-03-10: qty 50

## 2018-03-10 MED ORDER — SODIUM CHLORIDE 0.9 % IV SOLN
Freq: Once | INTRAVENOUS | Status: AC
  Administered 2018-03-10: 12:00:00 via INTRAVENOUS
  Filled 2018-03-10: qty 5

## 2018-03-10 NOTE — Patient Instructions (Addendum)
[facility] Discharge Instructions for Patients Receiving Chemotherapy  Today you received the following chemotherapy agents: Pembrolizumab Beryle Flock), Paclitaxel (Taxol), Carboplatin (Paraplatin).  To help prevent nausea and vomiting after your treatment, we encourage you to take your nausea medication as prescribed. Received Aloxi during treatment today-->Take Compazine (not Zofran) for the next 3 days as needed.  If you develop nausea and vomiting that is not controlled by your nausea medication, call the clinic.   BELOW ARE SYMPTOMS THAT SHOULD BE REPORTED IMMEDIATELY:  *FEVER GREATER THAN 100.5 F  *CHILLS WITH OR WITHOUT FEVER  NAUSEA AND VOMITING THAT IS NOT CONTROLLED WITH YOUR NAUSEA MEDICATION  *UNUSUAL SHORTNESS OF BREATH  *UNUSUAL BRUISING OR BLEEDING  TENDERNESS IN MOUTH AND THROAT WITH OR WITHOUT PRESENCE OF ULCERS  *URINARY PROBLEMS  *BOWEL PROBLEMS  UNUSUAL RASH Items with * indicate a potential emergency and should be followed up as soon as possible.  Feel free to call the clinic should you have any questions or concerns. The clinic phone number is [phone].  Please show the [facility] at check-in to the Emergency Department and triage nurse.  Pembrolizumab injection What is this medicine? PEMBROLIZUMAB (pem broe liz ue mab) is a monoclonal antibody. It is used to treat melanoma, head and neck cancer, Hodgkin lymphoma, non-small cell lung cancer, urothelial cancer, stomach cancer, and cancers that have a certain genetic condition. This medicine may be used for other purposes; ask your health care provider or pharmacist if you have questions. COMMON BRAND NAME(S): Keytruda What should I tell my health care provider before I take this medicine? They need to know if you have any of these conditions: -diabetes -immune system problems -inflammatory bowel disease -liver disease -lung or breathing disease -lupus -organ transplant -an  unusual or allergic reaction to pembrolizumab, other medicines, foods, dyes, or preservatives -pregnant or trying to get pregnant -breast-feeding How should I use this medicine? This medicine is for infusion into a vein. It is given by a health care professional in a hospital or clinic setting. A special MedGuide will be given to you before each treatment. Be sure to read this information carefully each time. Talk to your pediatrician regarding the use of this medicine in children. While this drug may be prescribed for selected conditions, precautions do apply. Overdosage: If you think you have taken too much of this medicine contact a poison control center or emergency room at once. NOTE: This medicine is only for you. Do not share this medicine with others. What if I miss a dose? It is important not to miss your dose. Call your doctor or health care professional if you are unable to keep an appointment. What may interact with this medicine? Interactions have not been studied. Give your health care provider a list of all the medicines, herbs, non-prescription drugs, or dietary supplements you use. Also tell them if you smoke, drink alcohol, or use illegal drugs. Some items may interact with your medicine. This list may not describe all possible interactions. Give your health care provider a list of all the medicines, herbs, non-prescription drugs, or dietary supplements you use. Also tell them if you smoke, drink alcohol, or use illegal drugs. Some items may interact with your medicine. What should I watch for while using this medicine? Your condition will be monitored carefully while you are receiving this medicine. You may need blood work done while you are taking this medicine. Do not become pregnant while taking this medicine or for 4  months after stopping it. Women should inform their doctor if they wish to become pregnant or think they might be pregnant. There is a potential for serious side  effects to an unborn child. Talk to your health care professional or pharmacist for more information. Do not breast-feed an infant while taking this medicine or for 4 months after the last dose. What side effects may I notice from receiving this medicine? Side effects that you should report to your doctor or health care professional as soon as possible: -allergic reactions like skin rash, itching or hives, swelling of the face, lips, or tongue -bloody or black, tarry -breathing problems -changes in vision -chest pain -chills -constipation -cough -dizziness or feeling faint or lightheaded -fast or irregular heartbeat -fever -flushing -hair loss -low blood counts - this medicine may decrease the number of white blood cells, red blood cells and platelets. You may be at increased risk for infections and bleeding. -muscle pain -muscle weakness -persistent headache -signs and symptoms of high blood sugar such as dizziness; dry mouth; dry skin; fruity breath; nausea; stomach pain; increased hunger or thirst; increased urination -signs and symptoms of kidney injury like trouble passing urine or change in the amount of urine -signs and symptoms of liver injury like dark urine, light-colored stools, loss of appetite, nausea, right upper belly pain, yellowing of the eyes or skin -stomach pain -sweating -weight loss Side effects that usually do not require medical attention (report to your doctor or health care professional if they continue or are bothersome): -decreased appetite -diarrhea -tiredness This list may not describe all possible side effects. Call your doctor for medical advice about side effects. You may report side effects to FDA at 1-800-FDA-1088. Where should I keep my medicine? This drug is given in a hospital or clinic and will not be stored at home. NOTE: This sheet is a summary. It may not cover all possible information. If you have questions about this medicine, talk to your  doctor, pharmacist, or health care provider.  2018 Elsevier/Gold Standard (2016-08-26 12:29:36)  Paclitaxel injection What is this medicine? PACLITAXEL (PAK li TAX el) is a chemotherapy drug. It targets fast dividing cells, like cancer cells, and causes these cells to die. This medicine is used to treat ovarian cancer, breast cancer, and other cancers. This medicine may be used for other purposes; ask your health care provider or pharmacist if you have questions. COMMON BRAND NAME(S): Onxol, Taxol What should I tell my health care provider before I take this medicine? They need to know if you have any of these conditions: -blood disorders -irregular heartbeat -infection (especially a virus infection such as chickenpox, cold sores, or herpes) -liver disease -previous or ongoing radiation therapy -an unusual or allergic reaction to paclitaxel, alcohol, polyoxyethylated castor oil, other chemotherapy agents, other medicines, foods, dyes, or preservatives -pregnant or trying to get pregnant -breast-feeding How should I use this medicine? This drug is given as an infusion into a vein. It is administered in a hospital or clinic by a specially trained health care professional. Talk to your pediatrician regarding the use of this medicine in children. Special care may be needed. Overdosage: If you think you have taken too much of this medicine contact a poison control center or emergency room at once. NOTE: This medicine is only for you. Do not share this medicine with others. What if I miss a dose? It is important not to miss your dose. Call your doctor or health care professional if you are  unable to keep an appointment. What may interact with this medicine? Do not take this medicine with any of the following medications: -disulfiram -metronidazole This medicine may also interact with the following medications: -cyclosporine -diazepam -ketoconazole -medicines to increase blood counts like  filgrastim, pegfilgrastim, sargramostim -other chemotherapy drugs like cisplatin, doxorubicin, epirubicin, etoposide, teniposide, vincristine -quinidine -testosterone -vaccines -verapamil Talk to your doctor or health care professional before taking any of these medicines: -acetaminophen -aspirin -ibuprofen -ketoprofen -naproxen This list may not describe all possible interactions. Give your health care provider a list of all the medicines, herbs, non-prescription drugs, or dietary supplements you use. Also tell them if you smoke, drink alcohol, or use illegal drugs. Some items may interact with your medicine. What should I watch for while using this medicine? Your condition will be monitored carefully while you are receiving this medicine. You will need important blood work done while you are taking this medicine. This medicine can cause serious allergic reactions. To reduce your risk you will need to take other medicine(s) before treatment with this medicine. If you experience allergic reactions like skin rash, itching or hives, swelling of the face, lips, or tongue, tell your doctor or health care professional right away. In some cases, you may be given additional medicines to help with side effects. Follow all directions for their use. This drug may make you feel generally unwell. This is not uncommon, as chemotherapy can affect healthy cells as well as cancer cells. Report any side effects. Continue your course of treatment even though you feel ill unless your doctor tells you to stop. Call your doctor or health care professional for advice if you get a fever, chills or sore throat, or other symptoms of a cold or flu. Do not treat yourself. This drug decreases your body's ability to fight infections. Try to avoid being around people who are sick. This medicine may increase your risk to bruise or bleed. Call your doctor or health care professional if you notice any unusual bleeding. Be  careful brushing and flossing your teeth or using a toothpick because you may get an infection or bleed more easily. If you have any dental work done, tell your dentist you are receiving this medicine. Avoid taking products that contain aspirin, acetaminophen, ibuprofen, naproxen, or ketoprofen unless instructed by your doctor. These medicines may hide a fever. Do not become pregnant while taking this medicine. Women should inform their doctor if they wish to become pregnant or think they might be pregnant. There is a potential for serious side effects to an unborn child. Talk to your health care professional or pharmacist for more information. Do not breast-feed an infant while taking this medicine. Men are advised not to father a child while receiving this medicine. This product may contain alcohol. Ask your pharmacist or healthcare provider if this medicine contains alcohol. Be sure to tell all healthcare providers you are taking this medicine. Certain medicines, like metronidazole and disulfiram, can cause an unpleasant reaction when taken with alcohol. The reaction includes flushing, headache, nausea, vomiting, sweating, and increased thirst. The reaction can last from 30 minutes to several hours. What side effects may I notice from receiving this medicine? Side effects that you should report to your doctor or health care professional as soon as possible: -allergic reactions like skin rash, itching or hives, swelling of the face, lips, or tongue -low blood counts - This drug may decrease the number of white blood cells, red blood cells and platelets. You may be  at increased risk for infections and bleeding. -signs of infection - fever or chills, cough, sore throat, pain or difficulty passing urine -signs of decreased platelets or bleeding - bruising, pinpoint red spots on the skin, black, tarry stools, nosebleeds -signs of decreased red blood cells - unusually weak or tired, fainting spells,  lightheadedness -breathing problems -chest pain -high or low blood pressure -mouth sores -nausea and vomiting -pain, swelling, redness or irritation at the injection site -pain, tingling, numbness in the hands or feet -slow or irregular heartbeat -swelling of the ankle, feet, hands Side effects that usually do not require medical attention (report to your doctor or health care professional if they continue or are bothersome): -bone pain -complete hair loss including hair on your head, underarms, pubic hair, eyebrows, and eyelashes -changes in the color of fingernails -diarrhea -loosening of the fingernails -loss of appetite -muscle or joint pain -red flush to skin -sweating This list may not describe all possible side effects. Call your doctor for medical advice about side effects. You may report side effects to FDA at 1-800-FDA-1088. Where should I keep my medicine? This drug is given in a hospital or clinic and will not be stored at home. NOTE: This sheet is a summary. It may not cover all possible information. If you have questions about this medicine, talk to your doctor, pharmacist, or health care provider.  2018 Elsevier/Gold Standard (2015-09-18 19:58:00)  Carboplatin injection What is this medicine? CARBOPLATIN (KAR boe pla tin) is a chemotherapy drug. It targets fast dividing cells, like cancer cells, and causes these cells to die. This medicine is used to treat ovarian cancer and many other cancers. This medicine may be used for other purposes; ask your health care provider or pharmacist if you have questions. COMMON BRAND NAME(S): Paraplatin What should I tell my health care provider before I take this medicine? They need to know if you have any of these conditions: -blood disorders -hearing problems -kidney disease -recent or ongoing radiation therapy -an unusual or allergic reaction to carboplatin, cisplatin, other chemotherapy, other medicines, foods, dyes, or  preservatives -pregnant or trying to get pregnant -breast-feeding How should I use this medicine? This drug is usually given as an infusion into a vein. It is administered in a hospital or clinic by a specially trained health care professional. Talk to your pediatrician regarding the use of this medicine in children. Special care may be needed. Overdosage: If you think you have taken too much of this medicine contact a poison control center or emergency room at once. NOTE: This medicine is only for you. Do not share this medicine with others. What if I miss a dose? It is important not to miss a dose. Call your doctor or health care professional if you are unable to keep an appointment. What may interact with this medicine? -medicines for seizures -medicines to increase blood counts like filgrastim, pegfilgrastim, sargramostim -some antibiotics like amikacin, gentamicin, neomycin, streptomycin, tobramycin -vaccines Talk to your doctor or health care professional before taking any of these medicines: -acetaminophen -aspirin -ibuprofen -ketoprofen -naproxen This list may not describe all possible interactions. Give your health care provider a list of all the medicines, herbs, non-prescription drugs, or dietary supplements you use. Also tell them if you smoke, drink alcohol, or use illegal drugs. Some items may interact with your medicine. What should I watch for while using this medicine? Your condition will be monitored carefully while you are receiving this medicine. You will need important blood work done  while you are taking this medicine. This drug may make you feel generally unwell. This is not uncommon, as chemotherapy can affect healthy cells as well as cancer cells. Report any side effects. Continue your course of treatment even though you feel ill unless your doctor tells you to stop. In some cases, you may be given additional medicines to help with side effects. Follow all directions  for their use. Call your doctor or health care professional for advice if you get a fever, chills or sore throat, or other symptoms of a cold or flu. Do not treat yourself. This drug decreases your body's ability to fight infections. Try to avoid being around people who are sick. This medicine may increase your risk to bruise or bleed. Call your doctor or health care professional if you notice any unusual bleeding. Be careful brushing and flossing your teeth or using a toothpick because you may get an infection or bleed more easily. If you have any dental work done, tell your dentist you are receiving this medicine. Avoid taking products that contain aspirin, acetaminophen, ibuprofen, naproxen, or ketoprofen unless instructed by your doctor. These medicines may hide a fever. Do not become pregnant while taking this medicine. Women should inform their doctor if they wish to become pregnant or think they might be pregnant. There is a potential for serious side effects to an unborn child. Talk to your health care professional or pharmacist for more information. Do not breast-feed an infant while taking this medicine. What side effects may I notice from receiving this medicine? Side effects that you should report to your doctor or health care professional as soon as possible: -allergic reactions like skin rash, itching or hives, swelling of the face, lips, or tongue -signs of infection - fever or chills, cough, sore throat, pain or difficulty passing urine -signs of decreased platelets or bleeding - bruising, pinpoint red spots on the skin, black, tarry stools, nosebleeds -signs of decreased red blood cells - unusually weak or tired, fainting spells, lightheadedness -breathing problems -changes in hearing -changes in vision -chest pain -high blood pressure -low blood counts - This drug may decrease the number of white blood cells, red blood cells and platelets. You may be at increased risk for  infections and bleeding. -nausea and vomiting -pain, swelling, redness or irritation at the injection site -pain, tingling, numbness in the hands or feet -problems with balance, talking, walking -trouble passing urine or change in the amount of urine Side effects that usually do not require medical attention (report to your doctor or health care professional if they continue or are bothersome): -hair loss -loss of appetite -metallic taste in the mouth or changes in taste This list may not describe all possible side effects. Call your doctor for medical advice about side effects. You may report side effects to FDA at 1-800-FDA-1088. Where should I keep my medicine? This drug is given in a hospital or clinic and will not be stored at home. NOTE: This sheet is a summary. It may not cover all possible information. If you have questions about this medicine, talk to your doctor, pharmacist, or health care provider.  2018 Elsevier/Gold Standard (2008-02-22 14:38:05)

## 2018-03-11 ENCOUNTER — Ambulatory Visit: Payer: Medicare Other

## 2018-03-12 ENCOUNTER — Inpatient Hospital Stay: Payer: Medicare Other

## 2018-03-12 ENCOUNTER — Ambulatory Visit: Payer: Medicare Other

## 2018-03-12 VITALS — BP 125/82 | HR 91 | Temp 98.0°F | Resp 19

## 2018-03-12 DIAGNOSIS — Z5111 Encounter for antineoplastic chemotherapy: Secondary | ICD-10-CM | POA: Diagnosis not present

## 2018-03-12 DIAGNOSIS — C3491 Malignant neoplasm of unspecified part of right bronchus or lung: Secondary | ICD-10-CM

## 2018-03-12 MED ORDER — PEGFILGRASTIM-CBQV 6 MG/0.6ML ~~LOC~~ SOSY
PREFILLED_SYRINGE | SUBCUTANEOUS | Status: AC
  Filled 2018-03-12: qty 0.6

## 2018-03-12 MED ORDER — PEGFILGRASTIM-CBQV 6 MG/0.6ML ~~LOC~~ SOSY
6.0000 mg | PREFILLED_SYRINGE | Freq: Once | SUBCUTANEOUS | Status: AC
  Administered 2018-03-12: 6 mg via SUBCUTANEOUS

## 2018-03-12 NOTE — Patient Instructions (Signed)
Pegfilgrastim injection What is this medicine? PEGFILGRASTIM (PEG fil gra stim) is a long-acting granulocyte colony-stimulating factor that stimulates the growth of neutrophils, a type of white blood cell important in the body's fight against infection. It is used to reduce the incidence of fever and infection in patients with certain types of cancer who are receiving chemotherapy that affects the bone marrow, and to increase survival after being exposed to high doses of radiation. This medicine may be used for other purposes; ask your health care provider or pharmacist if you have questions. COMMON BRAND NAME(S): Neulasta What should I tell my health care provider before I take this medicine? They need to know if you have any of these conditions: -kidney disease -latex allergy -ongoing radiation therapy -sickle cell disease -skin reactions to acrylic adhesives (On-Body Injector only) -an unusual or allergic reaction to pegfilgrastim, filgrastim, other medicines, foods, dyes, or preservatives -pregnant or trying to get pregnant -breast-feeding How should I use this medicine? This medicine is for injection under the skin. If you get this medicine at home, you will be taught how to prepare and give the pre-filled syringe or how to use the On-body Injector. Refer to the patient Instructions for Use for detailed instructions. Use exactly as directed. Tell your healthcare provider immediately if you suspect that the On-body Injector may not have performed as intended or if you suspect the use of the On-body Injector resulted in a missed or partial dose. It is important that you put your used needles and syringes in a special sharps container. Do not put them in a trash can. If you do not have a sharps container, call your pharmacist or healthcare provider to get one. Talk to your pediatrician regarding the use of this medicine in children. While this drug may be prescribed for selected conditions,  precautions do apply. Overdosage: If you think you have taken too much of this medicine contact a poison control center or emergency room at once. NOTE: This medicine is only for you. Do not share this medicine with others. What if I miss a dose? It is important not to miss your dose. Call your doctor or health care professional if you miss your dose. If you miss a dose due to an On-body Injector failure or leakage, a new dose should be administered as soon as possible using a single prefilled syringe for manual use. What may interact with this medicine? Interactions have not been studied. Give your health care provider a list of all the medicines, herbs, non-prescription drugs, or dietary supplements you use. Also tell them if you smoke, drink alcohol, or use illegal drugs. Some items may interact with your medicine. This list may not describe all possible interactions. Give your health care provider a list of all the medicines, herbs, non-prescription drugs, or dietary supplements you use. Also tell them if you smoke, drink alcohol, or use illegal drugs. Some items may interact with your medicine. What should I watch for while using this medicine? You may need blood work done while you are taking this medicine. If you are going to need a MRI, CT scan, or other procedure, tell your doctor that you are using this medicine (On-Body Injector only). What side effects may I notice from receiving this medicine? Side effects that you should report to your doctor or health care professional as soon as possible: -allergic reactions like skin rash, itching or hives, swelling of the face, lips, or tongue -dizziness -fever -pain, redness, or irritation at site   where injected -pinpoint red spots on the skin -red or dark-brown urine -shortness of breath or breathing problems -stomach or side pain, or pain at the shoulder -swelling -tiredness -trouble passing urine or change in the amount of urine Side  effects that usually do not require medical attention (report to your doctor or health care professional if they continue or are bothersome): -bone pain -muscle pain This list may not describe all possible side effects. Call your doctor for medical advice about side effects. You may report side effects to FDA at 1-800-FDA-1088. Where should I keep my medicine? Keep out of the reach of children. Store pre-filled syringes in a refrigerator between 2 and 8 degrees C (36 and 46 degrees F). Do not freeze. Keep in carton to protect from light. Throw away this medicine if it is left out of the refrigerator for more than 48 hours. Throw away any unused medicine after the expiration date. NOTE: This sheet is a summary. It may not cover all possible information. If you have questions about this medicine, talk to your doctor, pharmacist, or health care provider.  2018 Elsevier/Gold Standard (2016-11-13 12:58:03)  

## 2018-03-15 ENCOUNTER — Ambulatory Visit: Payer: Medicare Other

## 2018-03-15 ENCOUNTER — Telehealth: Payer: Self-pay | Admitting: Physical Therapy

## 2018-03-15 ENCOUNTER — Ambulatory Visit: Payer: Medicare Other | Admitting: Rehabilitation

## 2018-03-15 NOTE — Telephone Encounter (Signed)
Spoke with patient's daughter [name]. She explained that they have been trying to get home health therapy for her as she has a hard time getting out except for her oncology appointments. We decided to cancel her appointment for outpatient PT today and she plans to discuss this with Dr. Julien Nordmann on 03/17/18. Annia Friendly, [place] 03/15/18 8:22 AM

## 2018-03-16 ENCOUNTER — Ambulatory Visit: Payer: Medicare Other

## 2018-03-17 ENCOUNTER — Telehealth: Payer: Self-pay | Admitting: Medical Oncology

## 2018-03-17 ENCOUNTER — Other Ambulatory Visit: Payer: Medicare Other

## 2018-03-17 ENCOUNTER — Ambulatory Visit: Payer: Medicare Other | Admitting: Oncology

## 2018-03-17 ENCOUNTER — Inpatient Hospital Stay (HOSPITAL_BASED_OUTPATIENT_CLINIC_OR_DEPARTMENT_OTHER): Payer: Medicare Other | Admitting: Oncology

## 2018-03-17 ENCOUNTER — Inpatient Hospital Stay: Payer: Medicare Other

## 2018-03-17 ENCOUNTER — Ambulatory Visit: Payer: Medicare Other

## 2018-03-17 ENCOUNTER — Telehealth: Payer: Self-pay | Admitting: Oncology

## 2018-03-17 VITALS — BP 145/64 | HR 100 | Temp 98.4°F | Resp 18 | Ht 61.0 in | Wt 128.4 lb

## 2018-03-17 DIAGNOSIS — M545 Low back pain: Secondary | ICD-10-CM | POA: Diagnosis not present

## 2018-03-17 DIAGNOSIS — C801 Malignant (primary) neoplasm, unspecified: Secondary | ICD-10-CM

## 2018-03-17 DIAGNOSIS — Z8673 Personal history of transient ischemic attack (TIA), and cerebral infarction without residual deficits: Secondary | ICD-10-CM

## 2018-03-17 DIAGNOSIS — G8929 Other chronic pain: Secondary | ICD-10-CM

## 2018-03-17 DIAGNOSIS — E785 Hyperlipidemia, unspecified: Secondary | ICD-10-CM | POA: Diagnosis not present

## 2018-03-17 DIAGNOSIS — C7989 Secondary malignant neoplasm of other specified sites: Secondary | ICD-10-CM

## 2018-03-17 DIAGNOSIS — C3491 Malignant neoplasm of unspecified part of right bronchus or lung: Secondary | ICD-10-CM

## 2018-03-17 DIAGNOSIS — Z5111 Encounter for antineoplastic chemotherapy: Secondary | ICD-10-CM | POA: Diagnosis not present

## 2018-03-17 DIAGNOSIS — I10 Essential (primary) hypertension: Secondary | ICD-10-CM | POA: Diagnosis not present

## 2018-03-17 LAB — CBC WITH DIFFERENTIAL (CANCER CENTER ONLY)
BASOS ABS: 0 10*3/uL (ref 0.0–0.1)
Basophils Relative: 1 %
Eosinophils Absolute: 0.1 10*3/uL (ref 0.0–0.5)
Eosinophils Relative: 1 %
HEMATOCRIT: 33.7 % — AB (ref 34.8–46.6)
HEMOGLOBIN: 11.4 g/dL — AB (ref 11.6–15.9)
LYMPHS PCT: 22 %
Lymphs Abs: 1.6 10*3/uL (ref 0.9–3.3)
MCH: 28 pg (ref 25.1–34.0)
MCHC: 33.8 g/dL (ref 31.5–36.0)
MCV: 82.8 fL (ref 79.5–101.0)
MONO ABS: 1.8 10*3/uL — AB (ref 0.1–0.9)
Monocytes Relative: 25 %
NEUTROS ABS: 3.8 10*3/uL (ref 1.5–6.5)
NEUTROS PCT: 51 %
Platelet Count: 173 10*3/uL (ref 145–400)
RBC: 4.07 MIL/uL (ref 3.70–5.45)
RDW: 14.9 % — AB (ref 11.2–14.5)
WBC Count: 7.4 10*3/uL (ref 3.9–10.3)

## 2018-03-17 LAB — CMP (CANCER CENTER ONLY)
ALBUMIN: 3.3 g/dL — AB (ref 3.5–5.0)
ALT: 28 U/L (ref 0–55)
ANION GAP: 7 (ref 3–11)
AST: 27 U/L (ref 5–34)
Alkaline Phosphatase: 130 U/L (ref 40–150)
BUN: 12 mg/dL (ref 7–26)
CO2: 23 mmol/L (ref 22–29)
Calcium: 10.8 mg/dL — ABNORMAL HIGH (ref 8.4–10.4)
Chloride: 102 mmol/L (ref 98–109)
Creatinine: 0.69 mg/dL (ref 0.60–1.10)
GFR, Est AFR Am: >60 mL/min (ref >60)
GFR, Estimated: >60 mL/min (ref >60)
GLUCOSE: 102 mg/dL (ref 70–140)
Potassium: 3.7 mmol/L (ref 3.5–5.1)
SODIUM: 132 mmol/L — AB (ref 136–145)
Total Bilirubin: 0.4 mg/dL (ref 0.2–1.2)
Total Protein: 7.6 g/dL (ref 6.4–8.3)

## 2018-03-17 NOTE — Telephone Encounter (Signed)
appts already scheduled per 4/17 los.

## 2018-03-17 NOTE — Progress Notes (Signed)
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  She presented with large central necrotic right lower neck mass in addition to bulky mediastinal and bilateral hilar adenopathy as well as a small hypermetabolic lung lesion diagnosed in February 2019.  PRIOR THERAPY: Palliative radiotherapy to the large necrotic mass of the right neck under the care of Dr. Isidore Moos, completed on February 19, 2018.  CURRENT THERAPY: Palliative systemic chemotherapy with carboplatin for AUC of 5, paclitaxel 175 mg/M2 and Keytruda 200 mg IV every 3 weeks.  First dose 03/03/2018.  Status post 1 cycle.  INTERVAL HISTORY: Faith [name] 67 y.o. female returns for routine follow-up visit accompanied by her granddaughter.  The patient reports that she is having significant back pain related to her Neulasta injection.  She is taking Claritin daily.  She is also using oxycodone 4 times a day.  This is prescribed by the pain clinic.  She also uses Robaxin and gabapentin prescribed by the pain clinic.  She is not getting much relief with these methods.  Otherwise tolerate her first cycle of chemotherapy fairly well.  Denies fevers and chills.  Denies chest pain, shortness breath, cough, hemoptysis.  She had mild nausea and vomited on one occasion.  She has been using her antiemetics which are effective.  Denies constipation or diarrhea.  The patient is here for evaluation and repeat lab work.  MEDICAL HISTORY: Past Medical History:  Diagnosis Date  . Anxiety   . Arthritis    "thighs; legs; hips" (07/05/2014)  . Bipolar disorder ([place])   . Cholelithiasis 07/2014  . Chronic bronchitis ([place])    "get it q yr"  . Chronic lower back pain   . Depression    pt. use to go to depression clinic, states she still has depression & anxiety but can't get to appt. so she hasn't had  any med. for it in a while   . Dyspnea   . GERD (gastroesophageal reflux disease)   . High cholesterol   . Hypertension   . Mass in neck 12/2017   RIGHT SIDE OF NECK  . Peripheral vascular disease ([place])   . Schizophrenia ([place])   . Stroke [facility])     ALLERGIES:  is allergic to penicillins; codeine; and percocet [oxycodone-acetaminophen].  MEDICATIONS:  Current Outpatient Medications  Medication Sig Dispense Refill  . acetaminophen (TYLENOL) 325 MG tablet Take 2 tablets (650 mg total) by mouth every 6 (six) hours as needed for mild pain (or Fever >/= 101). (Patient not taking: Reported on 02/23/2018)    . albuterol (PROVENTIL HFA;VENTOLIN HFA) 108 (90 Base) MCG/ACT inhaler Inhale 1-2 puffs into the lungs every 6 (six) hours as needed for wheezing or shortness of breath.    Marland Kitchen albuterol (PROVENTIL) (2.5 MG/3ML) 0.083% nebulizer solution Take 2.5 mg by nebulization every 6 (six) hours as needed for wheezing or shortness of breath.    . AMITIZA 24 MCG capsule Take 24 mcg by mouth 2 (two) times daily.  0  . aspirin EC 325 MG tablet Take 1 tablet (325 mg total) by mouth daily. (Patient not taking: Reported on 02/23/2018) 30 tablet 0  . clopidogrel (PLAVIX) 75 MG tablet Take 1 tablet (75 mg total) by mouth daily. 30 tablet 0  . diclofenac sodium (VOLTAREN) 1 % GEL Apply 2 g topically daily as needed (pain).     Marland Kitchen  diphenhydrAMINE (BENADRYL) 25 MG tablet Take 2 tablets (50 mg total) by mouth at bedtime as needed for sleep. 20 tablet 0  . fluticasone (FLONASE) 50 MCG/ACT nasal spray Place 2 sprays into both nostrils daily.   0  . furosemide (LASIX) 20 MG tablet Take 20 mg by mouth daily as needed for fluid or edema.     . gabapentin (NEURONTIN) 300 MG capsule Take 300 mg by mouth 3 (three) times daily.     Marland Kitchen lidocaine (XYLOCAINE) 2 % solution Caregiver: Mix 1part 2% viscous lidocaine, 1part H20. Swish & swallow 69mL of diluted mixture, 40min before meals and at bedtime, up to QID 100 mL 5  . Linaclotide  (LINZESS) 290 MCG CAPS capsule Take 290-580 mcg by mouth daily as needed (constipation).     Marland Kitchen loratadine (CLARITIN) 10 MG tablet Take 10 mg by mouth daily.    . methocarbamol (ROBAXIN) 750 MG tablet Take 750 mg by mouth 2 (two) times daily as needed for muscle spasms.     . mometasone (ELOCON) 0.1 % ointment Apply 1 application topically daily as needed (irritation).     Marland Kitchen omeprazole (PRILOSEC) 20 MG capsule Take 20 mg by mouth 2 (two) times daily before a meal.    . Oxycodone HCl 20 MG TABS Take 1 tablet by mouth 4 (four) times daily as needed.  0  . oxyCODONE-acetaminophen (PERCOCET) 10-325 MG tablet Take 1 tablet by mouth every 6 (six) hours as needed for pain. (Patient not taking: Reported on 02/23/2018) 30 tablet 0  . polyethylene glycol (MIRALAX / GLYCOLAX) packet Take 17 g by mouth daily. 14 each 0  . potassium chloride 20 MEQ/15ML (10%) SOLN Take 15 mLs by mouth daily.    . prochlorperazine (COMPAZINE) 10 MG tablet Take 1 tablet (10 mg total) by mouth every 6 (six) hours as needed for nausea or vomiting. (Patient not taking: Reported on 02/23/2018) 30 tablet 0  . Vitamin D, Ergocalciferol, (DRISDOL) 50000 units CAPS capsule Take 50,000 Units by mouth every 7 (seven) days.     No current facility-administered medications for this visit.     SURGICAL HISTORY:  Past Surgical History:  Procedure Laterality Date  . ABDOMINAL AORTOGRAM N/A 01/07/2017   Procedure: Abdominal Aortogram;  Surgeon: Waynetta Sandy, MD;  Location: [facility];  Service: Cardiovascular;  Laterality: N/A;  . ABDOMINAL HYSTERECTOMY    . ANKLE FRACTURE SURGERY Right   . ANKLE HARDWARE REMOVAL Right   . APPENDECTOMY  ~ 1963  . BREAST BIOPSY Bilateral   . BREAST CYST EXCISION Bilateral    "not cancer"  . CATARACT EXTRACTION W/ INTRAOCULAR LENS  IMPLANT, BILATERAL    . CHOLECYSTECTOMY N/A 07/05/2014   Procedure: LAPAROSCOPIC CHOLECYSTECTOMY WITH INTRAOPERATIVE CHOLANGIOGRAM;  Surgeon: Gayland Curry, MD;   Location: [facility];  Service: General;  Laterality: N/A;  . EXCISIONAL HEMORRHOIDECTOMY    . FEMORAL-POPLITEAL BYPASS GRAFT Right 01/15/2017   Procedure: BYPASS GRAFT FEMORAL-POPLITEAL ARTERY RIGHT LEG;  Surgeon: Waynetta Sandy, MD;  Location: [facility];  Service: Vascular;  Laterality: Right;  . IR FLUORO GUIDE PORT INSERTION LEFT  03/08/2018  . IR US GUIDE VASC ACCESS LEFT  03/08/2018  . LAPAROSCOPIC CHOLECYSTECTOMY  07/05/2014  . LOWER EXTREMITY ANGIOGRAPHY Bilateral 01/07/2017   Procedure: Lower Extremity Angiography;  Surgeon: Waynetta Sandy, MD;  Location: [facility];  Service: Cardiovascular;  Laterality: Bilateral;  . MASS BIOPSY Right 12/25/2017   Procedure: INCISIONAL RIGHT NECK MASS BIOPSY;  Surgeon: Blenda Nicely,  San Jetty, MD;  Location: [facility];  Service: ENT;  Laterality: Right;  . MULTIPLE TOOTH EXTRACTIONS  05/2013   "pulled my upper teeth"  . PILONIDAL CYST EXCISION      REVIEW OF SYSTEMS:   Review of Systems  Constitutional: Negative for appetite change, chills, fatigue, fever and unexpected weight change.  HENT:   Negative for mouth sores, nosebleeds, sore throat and trouble swallowing.   Eyes: Negative for eye problems and icterus.  Respiratory: Negative for cough, hemoptysis, shortness of breath and wheezing.   Cardiovascular: Negative for chest pain and leg swelling.  Gastrointestinal: Negative for abdominal pain, constipation, diarrhea, nausea and vomiting.  Genitourinary: Negative for bladder incontinence, difficulty urinating, dysuria, frequency and hematuria.   Musculoskeletal: Negative for gait problem, neck pain and neck stiffness. Positive for back pain. Skin: Negative for itching and rash.  Neurological: Negative for dizziness, extremity weakness, gait problem, headaches, light-headedness and seizures.  Hematological: Negative for adenopathy. Does not bruise/bleed easily.  Psychiatric/Behavioral: Negative for confusion, depression and sleep  disturbance. The patient is not nervous/anxious.     PHYSICAL EXAMINATION:  Blood pressure (!) 145/64, pulse 100, temperature 98.4 F (36.9 C), temperature source Oral, resp. rate 18, height 5\' 1"  (1.549 m), weight 128 lb 6.4 oz (58.2 kg), SpO2 100 %.  ECOG PERFORMANCE STATUS: 1 - Symptomatic but completely ambulatory  Physical Exam  Constitutional: Oriented to person, place, and time and well-developed, well-nourished, and in no distress. No distress.  HENT:  Head: Normocephalic and atraumatic.  Mouth/Throat: Oropharynx is clear and moist. No oropharyngeal exudate.  Eyes: Conjunctivae are normal. Right eye exhibits no discharge. Left eye exhibits no discharge. No scleral icterus.  Neck: Normal range of motion.  Right neck mass noted.  Cardiovascular: Normal rate, regular rhythm, normal heart sounds and intact distal pulses.   Pulmonary/Chest: Effort normal and breath sounds normal. No respiratory distress. No wheezes. No rales.  Abdominal: Soft. Bowel sounds are normal. Exhibits no distension and no mass. There is no tenderness.  Musculoskeletal: Normal range of motion. Exhibits no edema.  Lymphadenopathy:    No cervical adenopathy.  Neurological: Alert and oriented to person, place, and time. Exhibits normal muscle tone. Gait normal. Coordination normal.  Skin: Skin is warm and dry. No rash noted. Not diaphoretic. No erythema. No pallor.  Psychiatric: Mood, memory and judgment normal. The patient is tearful at times due to her back pain. Vitals reviewed.  LABORATORY DATA: Lab Results  Component Value Date   WBC 7.4 03/17/2018   HGB 12.5 03/08/2018   HCT 33.7 (L) 03/17/2018   MCV 82.8 03/17/2018   PLT 173 03/17/2018      Chemistry      Component Value Date/Time   NA 132 (L) 03/17/2018 1442   K 3.7 03/17/2018 1442   CL 102 03/17/2018 1442   CO2 23 03/17/2018 1442   BUN 12 03/17/2018 1442   CREATININE 0.69 03/17/2018 1442      Component Value Date/Time   CALCIUM 10.8  (H) 03/17/2018 1442   ALKPHOS 130 03/17/2018 1442   AST 27 03/17/2018 1442   ALT 28 03/17/2018 1442   BILITOT 0.4 03/17/2018 1442       RADIOGRAPHIC STUDIES:  Ir US Guide Vasc Access Left  Result Date: 03/08/2018 INDICATION: 67 year old with metastatic poorly differentiated carcinoma that is suspicious for primary lung versus head/neck cancer. Patient presents for Port-A-Cath placement. EXAM: FLUOROSCOPIC AND ULTRASOUND GUIDED PLACEMENT OF A SUBCUTANEOUS PORT. Physician: Stephan Minister. Anselm Pancoast, MD MEDICATIONS: Vancomycin 1 g  ANESTHESIA/SEDATION: Versed 4.0 mg IV; Fentanyl 200 mcg IV; Moderate Sedation Time:  31 minutes The patient was continuously monitored during the procedure by the interventional radiology nurse under my direct supervision. FLUOROSCOPY TIME:  48 seconds, 3 mGy COMPLICATIONS: None immediate. PROCEDURE: The risks of the procedure were explained to the patient. Informed consent was obtained. Patient was placed supine on the interventional table. Ultrasound confirmed a patent left internal jugular vein. The left chest and neck were cleaned with a skin antiseptic and a sterile drape was placed. Maximal barrier sterile technique was utilized including caps, mask, sterile gowns, sterile gloves, sterile drape, hand hygiene and skin antiseptic. The left neck was anesthetized with 1% lidocaine. Small incision was made in the left neck with a blade. Micropuncture set was placed in the left IJ with ultrasound guidance. The micropuncture wire was used for measurement purposes. The left chest was anesthetized with 1% lidocaine with epinephrine. #15 blade was used to make an incision and a subcutaneous port pocket was formed. [place] was assembled. Subcutaneous tunnel was formed with a stiff tunneling device. The port catheter was brought through the subcutaneous tunnel. The port was placed in the subcutaneous pocket. The micropuncture set was exchanged for a peel-away sheath. The catheter was  placed through the peel-away sheath and the tip was positioned at the superior cavoatrial junction. Catheter placement was confirmed with fluoroscopy. The port was accessed and flushed with heparinized saline. The port pocket was closed using two layers of absorbable sutures and Dermabond. The vein skin site was closed using a single layer of absorbable suture and Dermabond. Sterile dressings were applied. Patient tolerated the procedure well without an immediate complication. Ultrasound and fluoroscopic images were taken and saved for this procedure. IMPRESSION: Placement of a subcutaneous port device. This Port-A-Cath is CT injectable. Electronically Signed   By: Markus Daft M.D.   On: 03/08/2018 16:59   Ir Fluoro Guide Port Insertion Left  Result Date: 03/08/2018 INDICATION: 67 year old with metastatic poorly differentiated carcinoma that is suspicious for primary lung versus head/neck cancer. Patient presents for Port-A-Cath placement. EXAM: FLUOROSCOPIC AND ULTRASOUND GUIDED PLACEMENT OF A SUBCUTANEOUS PORT. Physician: Stephan Minister. Henn, MD MEDICATIONS: Vancomycin 1 g ANESTHESIA/SEDATION: Versed 4.0 mg IV; Fentanyl 200 mcg IV; Moderate Sedation Time:  31 minutes The patient was continuously monitored during the procedure by the interventional radiology nurse under my direct supervision. FLUOROSCOPY TIME:  48 seconds, 3 mGy COMPLICATIONS: None immediate. PROCEDURE: The risks of the procedure were explained to the patient. Informed consent was obtained. Patient was placed supine on the interventional table. Ultrasound confirmed a patent left internal jugular vein. The left chest and neck were cleaned with a skin antiseptic and a sterile drape was placed. Maximal barrier sterile technique was utilized including caps, mask, sterile gowns, sterile gloves, sterile drape, hand hygiene and skin antiseptic. The left neck was anesthetized with 1% lidocaine. Small incision was made in the left neck with a blade.  Micropuncture set was placed in the left IJ with ultrasound guidance. The micropuncture wire was used for measurement purposes. The left chest was anesthetized with 1% lidocaine with epinephrine. #15 blade was used to make an incision and a subcutaneous port pocket was formed. [place] was assembled. Subcutaneous tunnel was formed with a stiff tunneling device. The port catheter was brought through the subcutaneous tunnel. The port was placed in the subcutaneous pocket. The micropuncture set was exchanged for a peel-away sheath. The catheter was placed through the peel-away  sheath and the tip was positioned at the superior cavoatrial junction. Catheter placement was confirmed with fluoroscopy. The port was accessed and flushed with heparinized saline. The port pocket was closed using two layers of absorbable sutures and Dermabond. The vein skin site was closed using a single layer of absorbable suture and Dermabond. Sterile dressings were applied. Patient tolerated the procedure well without an immediate complication. Ultrasound and fluoroscopic images were taken and saved for this procedure. IMPRESSION: Placement of a subcutaneous port device. This Port-A-Cath is CT injectable. Electronically Signed   By: Markus Daft M.D.   On: 03/08/2018 16:59     ASSESSMENT/PLAN:  Non-small cell carcinoma of right lung, stage 4 (HCC) This is a very pleasant 67 year old African-American female with metastatic poorly differentiated carcinoma highly suspicious for primary lung cancer versus head and neck cancer.  She is status post palliative radiotherapy to the large right neck mass.    The patient is on systemic treatment with carboplatin for AUC of 5, paclitaxel 175 mg/M2 and Keytruda 200 mg IV every 3 weeks.   She tolerated her first treatment well with the exception of back pain due to Neulasta.  She is currently followed by a pain clinic due to her chronic back pain. Labs reviewed and are stable.  We  discussed that she should continue her current pain medication regimen and follow-up with her pain management doctor as scheduled.  I discussed adding ibuprofen to see if this would help with her pain.  Patient states she does not want any additional Neulasta.  We will discuss this further when she comes back for follow-up in approximately 2 weeks for evaluation prior to cycle 2.  She was advised to call immediately if she has any concerning symptoms in the interval. The patient voices understanding of current disease status and treatment options and is in agreement with the current care plan.  All questions were answered. The patient knows to call the clinic with any problems, questions or concerns. We can certainly see the patient much sooner if necessary.   No orders of the defined types were placed in this encounter.  Mikey Bussing, DNP, AGPCNP-BC, AOCNP 03/19/18

## 2018-03-17 NOTE — Telephone Encounter (Signed)
Saw Faith [name] today in follow up

## 2018-03-18 ENCOUNTER — Ambulatory Visit: Payer: Medicare Other

## 2018-03-19 ENCOUNTER — Encounter: Payer: Self-pay | Admitting: Oncology

## 2018-03-19 ENCOUNTER — Ambulatory Visit: Payer: Medicare Other

## 2018-03-19 NOTE — Assessment & Plan Note (Signed)
This is a very pleasant 67 year old African-American female with metastatic poorly differentiated carcinoma highly suspicious for primary lung cancer versus head and neck cancer.  She is status post palliative radiotherapy to the large right neck mass.    The patient is on systemic treatment with carboplatin for AUC of 5, paclitaxel 175 mg/M2 and Keytruda 200 mg IV every 3 weeks.   She tolerated her first treatment well with the exception of back pain due to Neulasta.  She is currently followed by a pain clinic due to her chronic back pain. Labs reviewed and are stable.  We discussed that she should continue her current pain medication regimen and follow-up with her pain management doctor as scheduled.  I discussed adding ibuprofen to see if this would help with her pain.  Patient states she does not want any additional Neulasta.  We will discuss this further when she comes back for follow-up in approximately 2 weeks for evaluation prior to cycle 2.  She was advised to call immediately if she has any concerning symptoms in the interval. The patient voices understanding of current disease status and treatment options and is in agreement with the current care plan.  All questions were answered. The patient knows to call the clinic with any problems, questions or concerns. We can certainly see the patient much sooner if necessary.

## 2018-03-24 ENCOUNTER — Other Ambulatory Visit: Payer: Medicare Other

## 2018-03-24 ENCOUNTER — Other Ambulatory Visit: Payer: Self-pay | Admitting: *Deleted

## 2018-03-24 ENCOUNTER — Ambulatory Visit: Payer: Medicare Other | Admitting: Oncology

## 2018-03-24 ENCOUNTER — Ambulatory Visit: Payer: Self-pay | Admitting: Radiation Oncology

## 2018-03-24 ENCOUNTER — Inpatient Hospital Stay: Payer: Medicare Other

## 2018-03-24 DIAGNOSIS — Z5111 Encounter for antineoplastic chemotherapy: Secondary | ICD-10-CM | POA: Diagnosis not present

## 2018-03-24 DIAGNOSIS — C3491 Malignant neoplasm of unspecified part of right bronchus or lung: Secondary | ICD-10-CM

## 2018-03-24 LAB — CBC WITH DIFFERENTIAL (CANCER CENTER ONLY)
BASOS PCT: 0 %
Basophils Absolute: 0 10*3/uL (ref 0.0–0.1)
EOS PCT: 0 %
Eosinophils Absolute: 0 10*3/uL (ref 0.0–0.5)
HCT: 33.7 % — ABNORMAL LOW (ref 34.8–46.6)
Hemoglobin: 11.3 g/dL — ABNORMAL LOW (ref 11.6–15.9)
LYMPHS ABS: 1.9 10*3/uL (ref 0.9–3.3)
Lymphocytes Relative: 12 %
MCH: 27.8 pg (ref 25.1–34.0)
MCHC: 33.6 g/dL (ref 31.5–36.0)
MCV: 82.9 fL (ref 79.5–101.0)
MONOS PCT: 7 %
Monocytes Absolute: 1.1 10*3/uL — ABNORMAL HIGH (ref 0.1–0.9)
Neutro Abs: 12.1 10*3/uL — ABNORMAL HIGH (ref 1.5–6.5)
Neutrophils Relative %: 81 %
PLATELETS: 198 10*3/uL (ref 145–400)
RBC: 4.06 MIL/uL (ref 3.70–5.45)
RDW: 15.2 % — AB (ref 11.2–14.5)
WBC Count: 15.1 10*3/uL — ABNORMAL HIGH (ref 3.9–10.3)

## 2018-03-24 LAB — CMP (CANCER CENTER ONLY)
ALK PHOS: 127 U/L (ref 40–150)
ALT: 24 U/L (ref 0–55)
AST: 15 U/L (ref 5–34)
Albumin: 3.2 g/dL — ABNORMAL LOW (ref 3.5–5.0)
Anion gap: 10 (ref 3–11)
BUN: 18 mg/dL (ref 7–26)
CALCIUM: 9.8 mg/dL (ref 8.4–10.4)
CHLORIDE: 112 mmol/L — AB (ref 98–109)
CO2: 18 mmol/L — AB (ref 22–29)
CREATININE: 0.7 mg/dL (ref 0.60–1.10)
GFR, Est AFR Am: >60 mL/min (ref >60)
Glucose, Bld: 177 mg/dL — ABNORMAL HIGH (ref 70–140)
Potassium: 3 mmol/L — CL (ref 3.5–5.1)
SODIUM: 140 mmol/L (ref 136–145)
Total Bilirubin: 0.3 mg/dL (ref 0.2–1.2)
Total Protein: 7.3 g/dL (ref 6.4–8.3)

## 2018-03-24 MED ORDER — POTASSIUM CHLORIDE CRYS ER 20 MEQ PO TBCR: 20.0000 meq | EXTENDED_RELEASE_TABLET | Freq: Every day | ORAL | 0 refills | Status: DC

## 2018-03-26 ENCOUNTER — Telehealth: Payer: Self-pay | Admitting: *Deleted

## 2018-03-26 NOTE — Telephone Encounter (Signed)
-----   Message from Curt Bears, MD sent at 03/25/2018  1:20 PM EDT ----- Regarding: FW: ----- Message ----- From: Buel Ream, Lab In [facility] Sent: 03/24/2018   3:02 PM To: Curt Bears, MD

## 2018-03-26 NOTE — Telephone Encounter (Signed)
Late entry Rx for K+ sent to pt pharmacy, notified pt. Pt verbalized understanding.

## 2018-03-31 ENCOUNTER — Telehealth: Payer: Self-pay | Admitting: Internal Medicine

## 2018-03-31 ENCOUNTER — Inpatient Hospital Stay: Payer: Medicare Other

## 2018-03-31 ENCOUNTER — Inpatient Hospital Stay: Payer: Medicare Other | Admitting: Nutrition

## 2018-03-31 ENCOUNTER — Encounter: Payer: Medicare Other | Admitting: Nutrition

## 2018-03-31 ENCOUNTER — Telehealth: Payer: Self-pay | Admitting: Medical Oncology

## 2018-03-31 ENCOUNTER — Encounter: Payer: Self-pay | Admitting: Internal Medicine

## 2018-03-31 ENCOUNTER — Inpatient Hospital Stay (HOSPITAL_BASED_OUTPATIENT_CLINIC_OR_DEPARTMENT_OTHER): Payer: Medicare Other | Admitting: Internal Medicine

## 2018-03-31 ENCOUNTER — Inpatient Hospital Stay: Payer: Medicare Other | Attending: Internal Medicine

## 2018-03-31 VITALS — BP 114/45 | HR 75 | Temp 98.2°F | Resp 17 | Ht 61.0 in | Wt 124.0 lb

## 2018-03-31 DIAGNOSIS — Z79899 Other long term (current) drug therapy: Secondary | ICD-10-CM | POA: Insufficient documentation

## 2018-03-31 DIAGNOSIS — C3491 Malignant neoplasm of unspecified part of right bronchus or lung: Secondary | ICD-10-CM

## 2018-03-31 DIAGNOSIS — C801 Malignant (primary) neoplasm, unspecified: Secondary | ICD-10-CM | POA: Diagnosis not present

## 2018-03-31 DIAGNOSIS — Z5111 Encounter for antineoplastic chemotherapy: Secondary | ICD-10-CM | POA: Diagnosis present

## 2018-03-31 DIAGNOSIS — C7989 Secondary malignant neoplasm of other specified sites: Secondary | ICD-10-CM | POA: Insufficient documentation

## 2018-03-31 DIAGNOSIS — R5382 Chronic fatigue, unspecified: Secondary | ICD-10-CM

## 2018-03-31 DIAGNOSIS — R590 Localized enlarged lymph nodes: Secondary | ICD-10-CM

## 2018-03-31 DIAGNOSIS — Z5112 Encounter for antineoplastic immunotherapy: Secondary | ICD-10-CM | POA: Insufficient documentation

## 2018-03-31 DIAGNOSIS — L598 Other specified disorders of the skin and subcutaneous tissue related to radiation: Secondary | ICD-10-CM

## 2018-03-31 DIAGNOSIS — Z5189 Encounter for other specified aftercare: Secondary | ICD-10-CM | POA: Diagnosis not present

## 2018-03-31 LAB — CMP (CANCER CENTER ONLY)
ALBUMIN: 3.1 g/dL — AB (ref 3.5–5.0)
ALT: 9 U/L (ref 0–55)
ANION GAP: 8 (ref 3–11)
AST: 15 U/L (ref 5–34)
Alkaline Phosphatase: 118 U/L (ref 40–150)
BILIRUBIN TOTAL: 0.3 mg/dL (ref 0.2–1.2)
BUN: 16 mg/dL (ref 7–26)
CO2: 22 mmol/L (ref 22–29)
Calcium: 10.1 mg/dL (ref 8.4–10.4)
Chloride: 108 mmol/L (ref 98–109)
Creatinine: 0.67 mg/dL (ref 0.60–1.10)
Glucose, Bld: 106 mg/dL (ref 70–140)
Potassium: 3.9 mmol/L (ref 3.5–5.1)
Sodium: 138 mmol/L (ref 136–145)
TOTAL PROTEIN: 7.1 g/dL (ref 6.4–8.3)

## 2018-03-31 LAB — CBC WITH DIFFERENTIAL (CANCER CENTER ONLY)
BASOS ABS: 0.1 10*3/uL (ref 0.0–0.1)
Basophils Relative: 1 %
Eosinophils Absolute: 0 10*3/uL (ref 0.0–0.5)
Eosinophils Relative: 0 %
HEMATOCRIT: 33.1 % — AB (ref 34.8–46.6)
Hemoglobin: 11.1 g/dL — ABNORMAL LOW (ref 11.6–15.9)
LYMPHS PCT: 12 %
Lymphs Abs: 1.1 10*3/uL (ref 0.9–3.3)
MCH: 28.4 pg (ref 25.1–34.0)
MCHC: 33.6 g/dL (ref 31.5–36.0)
MCV: 84.6 fL (ref 79.5–101.0)
Monocytes Absolute: 1.1 10*3/uL — ABNORMAL HIGH (ref 0.1–0.9)
Monocytes Relative: 11 %
NEUTROS ABS: 7.3 10*3/uL — AB (ref 1.5–6.5)
NEUTROS PCT: 76 %
Platelet Count: 321 10*3/uL (ref 145–400)
RBC: 3.91 MIL/uL (ref 3.70–5.45)
RDW: 16.7 % — ABNORMAL HIGH (ref 11.2–14.5)
WBC: 9.6 10*3/uL (ref 3.9–10.3)

## 2018-03-31 LAB — TSH: TSH: 0.96 u[IU]/mL (ref 0.308–3.960)

## 2018-03-31 MED ORDER — PALONOSETRON HCL INJECTION 0.25 MG/5ML
0.2500 mg | Freq: Once | INTRAVENOUS | Status: AC
  Administered 2018-03-31: 0.25 mg via INTRAVENOUS

## 2018-03-31 MED ORDER — SODIUM CHLORIDE 0.9 % IV SOLN
200.0000 mg | Freq: Once | INTRAVENOUS | Status: AC
  Administered 2018-03-31: 200 mg via INTRAVENOUS
  Filled 2018-03-31: qty 8

## 2018-03-31 MED ORDER — HEPARIN SOD (PORK) LOCK FLUSH 100 UNIT/ML IV SOLN
500.0000 [IU] | Freq: Once | INTRAVENOUS | Status: AC | PRN
  Administered 2018-03-31: 500 [IU]
  Filled 2018-03-31: qty 5

## 2018-03-31 MED ORDER — FAMOTIDINE IN NACL 20-0.9 MG/50ML-% IV SOLN
20.0000 mg | Freq: Once | INTRAVENOUS | Status: AC
Start: 2018-03-31 — End: 2018-03-31
  Administered 2018-03-31: 20 mg via INTRAVENOUS

## 2018-03-31 MED ORDER — PALONOSETRON HCL INJECTION 0.25 MG/5ML
INTRAVENOUS | Status: AC
  Filled 2018-03-31: qty 5

## 2018-03-31 MED ORDER — SODIUM CHLORIDE 0.9 % IV SOLN
Freq: Once | INTRAVENOUS | Status: AC
  Administered 2018-03-31: 12:00:00 via INTRAVENOUS
  Filled 2018-03-31: qty 5

## 2018-03-31 MED ORDER — SODIUM CHLORIDE 0.9 % IV SOLN
383.0000 mg | Freq: Once | INTRAVENOUS | Status: AC
  Administered 2018-03-31: 380 mg via INTRAVENOUS
  Filled 2018-03-31: qty 38

## 2018-03-31 MED ORDER — SODIUM CHLORIDE 0.9 % IV SOLN
Freq: Once | INTRAVENOUS | Status: AC
  Administered 2018-03-31: 12:00:00 via INTRAVENOUS

## 2018-03-31 MED ORDER — DIPHENHYDRAMINE HCL 50 MG/ML IJ SOLN
50.0000 mg | Freq: Once | INTRAMUSCULAR | Status: AC
  Administered 2018-03-31: 50 mg via INTRAVENOUS

## 2018-03-31 MED ORDER — SODIUM CHLORIDE 0.9 % IV SOLN
175.0000 mg/m2 | Freq: Once | INTRAVENOUS | Status: AC
  Administered 2018-03-31: 276 mg via INTRAVENOUS
  Filled 2018-03-31: qty 46

## 2018-03-31 MED ORDER — DIPHENHYDRAMINE HCL 50 MG/ML IJ SOLN
INTRAMUSCULAR | Status: AC
  Filled 2018-03-31: qty 1

## 2018-03-31 MED ORDER — FAMOTIDINE IN NACL 20-0.9 MG/50ML-% IV SOLN
INTRAVENOUS | Status: AC
  Filled 2018-03-31: qty 50

## 2018-03-31 MED ORDER — SODIUM CHLORIDE 0.9% FLUSH
10.0000 mL | INTRAVENOUS | Status: DC | PRN
  Administered 2018-03-31: 10 mL
  Filled 2018-03-31: qty 10

## 2018-03-31 NOTE — Telephone Encounter (Signed)
Pt does not want Neulasta with this tx. due to pain and "would rather die than take the shot".

## 2018-03-31 NOTE — Progress Notes (Signed)
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  She presented with large central necrotic right lower neck mass in addition to bulky mediastinal and bilateral hilar adenopathy as well as a small hypermetabolic lung lesion diagnosed in February 2019.  PRIOR THERAPY: Palliative radiotherapy to the large necrotic mass of the right neck under the care of Dr. Isidore Moos, completed on February 19, 2018.  CURRENT THERAPY: Palliative systemic chemotherapy with carboplatin for AUC of 5, paclitaxel 175 mg/M2 and Keytruda 200 mg IV every 3 weeks.  First dose 03/03/2018.  Status post 1 cycle.  INTERVAL HISTORY: Faith [name] 67 y.o. female returns to the clinic today for follow-up visit accompanied by her granddaughter.  The patient tolerated the first cycle of her chemotherapy fairly well except for the aching pain and fatigue after the Neulasta injection.  She tried Claritin and ibuprofen with mild improvement of her condition.  She denied having any significant nausea or vomiting.  She has no fever or chills.  The patient denied having any chest pain, shortness of breath, cough or hemoptysis.  She continues to have an open wound in the right neck area secondary to previous radiation to the right supraclavicular lymphadenopathy.  She is here today for evaluation before starting cycle #2 of her chemotherapy.  MEDICAL HISTORY: Past Medical History:  Diagnosis Date  . Anxiety   . Arthritis    "thighs; legs; hips" (07/05/2014)  . Bipolar disorder ([place])   . Cholelithiasis 07/2014  . Chronic bronchitis ([place])    "get it q yr"  . Chronic lower back pain   . Depression    pt. use to go to depression clinic, states she still has depression & anxiety but can't get to appt. so she hasn't had any med.  for it in a while   . Dyspnea   . GERD (gastroesophageal reflux disease)   . High cholesterol   . Hypertension   . Mass in neck 12/2017   RIGHT SIDE OF NECK  . Peripheral vascular disease ([place])   . Schizophrenia ([place])   . Stroke [facility])     ALLERGIES:  is allergic to penicillins; codeine; and percocet [oxycodone-acetaminophen].  MEDICATIONS:  Current Outpatient Medications  Medication Sig Dispense Refill  . albuterol (PROVENTIL HFA;VENTOLIN HFA) 108 (90 Base) MCG/ACT inhaler Inhale 1-2 puffs into the lungs every 6 (six) hours as needed for wheezing or shortness of breath.    . AMITIZA 24 MCG capsule Take 24 mcg by mouth 2 (two) times daily.  0  . clopidogrel (PLAVIX) 75 MG tablet Take 1 tablet (75 mg total) by mouth daily. 30 tablet 0  . diclofenac sodium (VOLTAREN) 1 % GEL Apply 2 g topically daily as needed (pain).     Marland Kitchen diphenhydrAMINE (BENADRYL) 25 MG tablet Take 2 tablets (50 mg total) by mouth at bedtime as needed for sleep. 20 tablet 0  . fluticasone (FLONASE) 50 MCG/ACT nasal spray Place 2 sprays into both nostrils daily.   0  . furosemide (LASIX) 20 MG tablet Take 20 mg by mouth daily as needed for fluid or edema.     . gabapentin (NEURONTIN) 300 MG capsule Take 300 mg by mouth 3 (three) times daily.     Marland Kitchen lidocaine (XYLOCAINE) 2 % solution Caregiver: Mix 1part 2% viscous lidocaine,  1part H20. Swish & swallow 61mL of diluted mixture, 25min before meals and at bedtime, up to QID 100 mL 5  . Linaclotide (LINZESS) 290 MCG CAPS capsule Take 290-580 mcg by mouth daily as needed (constipation).     Marland Kitchen loratadine (CLARITIN) 10 MG tablet Take 10 mg by mouth daily.    . methocarbamol (ROBAXIN) 750 MG tablet Take 750 mg by mouth 2 (two) times daily as needed for muscle spasms.     . mometasone (ELOCON) 0.1 % ointment Apply 1 application topically daily as needed (irritation).     Marland Kitchen omeprazole (PRILOSEC) 20 MG capsule Take 20 mg by mouth 2 (two) times daily before a meal.    . Oxycodone HCl  20 MG TABS Take 1 tablet by mouth 4 (four) times daily as needed.  0  . oxyCODONE-acetaminophen (PERCOCET) 10-325 MG tablet Take 1 tablet by mouth every 6 (six) hours as needed for pain. 30 tablet 0  . polyethylene glycol (MIRALAX / GLYCOLAX) packet Take 17 g by mouth daily. 14 each 0  . potassium chloride 20 MEQ/15ML (10%) SOLN Take 15 mLs by mouth daily.    . Vitamin D, Ergocalciferol, (DRISDOL) 50000 units CAPS capsule Take 50,000 Units by mouth every 7 (seven) days.    Marland Kitchen acetaminophen (TYLENOL) 325 MG tablet Take 2 tablets (650 mg total) by mouth every 6 (six) hours as needed for mild pain (or Fever >/= 101). (Patient not taking: Reported on 02/23/2018)    . albuterol (PROVENTIL) (2.5 MG/3ML) 0.083% nebulizer solution Take 2.5 mg by nebulization every 6 (six) hours as needed for wheezing or shortness of breath.    Marland Kitchen aspirin EC 325 MG tablet Take 1 tablet (325 mg total) by mouth daily. (Patient not taking: Reported on 02/23/2018) 30 tablet 0  . prochlorperazine (COMPAZINE) 10 MG tablet Take 1 tablet (10 mg total) by mouth every 6 (six) hours as needed for nausea or vomiting. (Patient not taking: Reported on 02/23/2018) 30 tablet 0   No current facility-administered medications for this visit.     SURGICAL HISTORY:  Past Surgical History:  Procedure Laterality Date  . ABDOMINAL AORTOGRAM N/A 01/07/2017   Procedure: Abdominal Aortogram;  Surgeon: Waynetta Sandy, MD;  Location: [facility];  Service: Cardiovascular;  Laterality: N/A;  . ABDOMINAL HYSTERECTOMY    . ANKLE FRACTURE SURGERY Right   . ANKLE HARDWARE REMOVAL Right   . APPENDECTOMY  ~ 1963  . BREAST BIOPSY Bilateral   . BREAST CYST EXCISION Bilateral    "not cancer"  . CATARACT EXTRACTION W/ INTRAOCULAR LENS  IMPLANT, BILATERAL    . CHOLECYSTECTOMY N/A 07/05/2014   Procedure: LAPAROSCOPIC CHOLECYSTECTOMY WITH INTRAOPERATIVE CHOLANGIOGRAM;  Surgeon: Gayland Curry, MD;  Location: [facility];  Service: General;  Laterality:  N/A;  . EXCISIONAL HEMORRHOIDECTOMY    . FEMORAL-POPLITEAL BYPASS GRAFT Right 01/15/2017   Procedure: BYPASS GRAFT FEMORAL-POPLITEAL ARTERY RIGHT LEG;  Surgeon: Waynetta Sandy, MD;  Location: [facility];  Service: Vascular;  Laterality: Right;  . IR FLUORO GUIDE PORT INSERTION LEFT  03/08/2018  . IR US GUIDE VASC ACCESS LEFT  03/08/2018  . LAPAROSCOPIC CHOLECYSTECTOMY  07/05/2014  . LOWER EXTREMITY ANGIOGRAPHY Bilateral 01/07/2017   Procedure: Lower Extremity Angiography;  Surgeon: Waynetta Sandy, MD;  Location: [facility];  Service: Cardiovascular;  Laterality: Bilateral;  . MASS BIOPSY Right 12/25/2017   Procedure: INCISIONAL RIGHT NECK MASS BIOPSY;  Surgeon: Helayne Seminole, MD;  Location: [facility];  Service: ENT;  Laterality: Right;  . MULTIPLE TOOTH EXTRACTIONS  05/2013   "pulled my upper teeth"  . PILONIDAL CYST EXCISION      REVIEW OF SYSTEMS:  Constitutional: positive for fatigue and weight loss Eyes: negative Ears, nose, mouth, throat, and face: negative Respiratory: negative Cardiovascular: negative Gastrointestinal: negative Genitourinary:negative Integument/breast: negative Hematologic/lymphatic: negative Musculoskeletal:positive for muscle weakness Neurological: negative Behavioral/Psych: negative Endocrine: negative Allergic/Immunologic: negative   PHYSICAL EXAMINATION: General appearance: alert, cooperative, fatigued and no distress Head: Normocephalic, without obvious abnormality, atraumatic Neck: no carotid bruit, no JVD, supple, symmetrical, trachea midline, thyroid not enlarged, symmetric, no tenderness/mass/nodules and Open wound in the right neck area secondary to previous radiation Lymph nodes: Cervical adenopathy: Enlargement right supraclavicular lymphadenopathy Resp: clear to auscultation bilaterally Back: symmetric, no curvature. ROM normal. No CVA tenderness. Cardio: regular rate and rhythm, S1, S2 normal, no murmur, click, rub or  gallop GI: soft, non-tender; bowel sounds normal; no masses,  no organomegaly Extremities: extremities normal, atraumatic, no cyanosis or edema Neurologic: Alert and oriented X 3, normal strength and tone. Normal symmetric reflexes. Normal coordination and gait  ECOG PERFORMANCE STATUS: 1 - Symptomatic but completely ambulatory  Blood pressure (!) 114/45, pulse 75, temperature 98.2 F (36.8 C), temperature source Oral, resp. rate 17, height 5\' 1"  (1.549 m), weight 124 lb (56.2 kg), SpO2 100 %.  LABORATORY DATA: Lab Results  Component Value Date   WBC 9.6 03/31/2018   HGB 11.1 (L) 03/31/2018   HCT 33.1 (L) 03/31/2018   MCV 84.6 03/31/2018   PLT 321 03/31/2018      Chemistry      Component Value Date/Time   NA 138 03/31/2018 0954   K 3.9 03/31/2018 0954   CL 108 03/31/2018 0954   CO2 22 03/31/2018 0954   BUN 16 03/31/2018 0954   CREATININE 0.67 03/31/2018 0954      Component Value Date/Time   CALCIUM 10.1 03/31/2018 0954   ALKPHOS 118 03/31/2018 0954   AST 15 03/31/2018 0954   ALT 9 03/31/2018 0954   BILITOT 0.3 03/31/2018 0954       RADIOGRAPHIC STUDIES: Ir US Guide Vasc Access Left  Result Date: 03/08/2018 INDICATION: 67 year old with metastatic poorly differentiated carcinoma that is suspicious for primary lung versus head/neck cancer. Patient presents for Port-A-Cath placement. EXAM: FLUOROSCOPIC AND ULTRASOUND GUIDED PLACEMENT OF A SUBCUTANEOUS PORT. Physician: Stephan Minister. Henn, MD MEDICATIONS: Vancomycin 1 g ANESTHESIA/SEDATION: Versed 4.0 mg IV; Fentanyl 200 mcg IV; Moderate Sedation Time:  31 minutes The patient was continuously monitored during the procedure by the interventional radiology nurse under my direct supervision. FLUOROSCOPY TIME:  48 seconds, 3 mGy COMPLICATIONS: None immediate. PROCEDURE: The risks of the procedure were explained to the patient. Informed consent was obtained. Patient was placed supine on the interventional table. Ultrasound confirmed a patent  left internal jugular vein. The left chest and neck were cleaned with a skin antiseptic and a sterile drape was placed. Maximal barrier sterile technique was utilized including caps, mask, sterile gowns, sterile gloves, sterile drape, hand hygiene and skin antiseptic. The left neck was anesthetized with 1% lidocaine. Small incision was made in the left neck with a blade. Micropuncture set was placed in the left IJ with ultrasound guidance. The micropuncture wire was used for measurement purposes. The left chest was anesthetized with 1% lidocaine with epinephrine. #15 blade was used to make an incision and a subcutaneous port pocket was formed. [place] was assembled. Subcutaneous tunnel was formed with a stiff tunneling device. The  port catheter was brought through the subcutaneous tunnel. The port was placed in the subcutaneous pocket. The micropuncture set was exchanged for a peel-away sheath. The catheter was placed through the peel-away sheath and the tip was positioned at the superior cavoatrial junction. Catheter placement was confirmed with fluoroscopy. The port was accessed and flushed with heparinized saline. The port pocket was closed using two layers of absorbable sutures and Dermabond. The vein skin site was closed using a single layer of absorbable suture and Dermabond. Sterile dressings were applied. Patient tolerated the procedure well without an immediate complication. Ultrasound and fluoroscopic images were taken and saved for this procedure. IMPRESSION: Placement of a subcutaneous port device. This Port-A-Cath is CT injectable. Electronically Signed   By: Markus Daft M.D.   On: 03/08/2018 16:59   Ir Fluoro Guide Port Insertion Left  Result Date: 03/08/2018 INDICATION: 67 year old with metastatic poorly differentiated carcinoma that is suspicious for primary lung versus head/neck cancer. Patient presents for Port-A-Cath placement. EXAM: FLUOROSCOPIC AND ULTRASOUND GUIDED PLACEMENT OF A  SUBCUTANEOUS PORT. Physician: Stephan Minister. Henn, MD MEDICATIONS: Vancomycin 1 g ANESTHESIA/SEDATION: Versed 4.0 mg IV; Fentanyl 200 mcg IV; Moderate Sedation Time:  31 minutes The patient was continuously monitored during the procedure by the interventional radiology nurse under my direct supervision. FLUOROSCOPY TIME:  48 seconds, 3 mGy COMPLICATIONS: None immediate. PROCEDURE: The risks of the procedure were explained to the patient. Informed consent was obtained. Patient was placed supine on the interventional table. Ultrasound confirmed a patent left internal jugular vein. The left chest and neck were cleaned with a skin antiseptic and a sterile drape was placed. Maximal barrier sterile technique was utilized including caps, mask, sterile gowns, sterile gloves, sterile drape, hand hygiene and skin antiseptic. The left neck was anesthetized with 1% lidocaine. Small incision was made in the left neck with a blade. Micropuncture set was placed in the left IJ with ultrasound guidance. The micropuncture wire was used for measurement purposes. The left chest was anesthetized with 1% lidocaine with epinephrine. #15 blade was used to make an incision and a subcutaneous port pocket was formed. [place] was assembled. Subcutaneous tunnel was formed with a stiff tunneling device. The port catheter was brought through the subcutaneous tunnel. The port was placed in the subcutaneous pocket. The micropuncture set was exchanged for a peel-away sheath. The catheter was placed through the peel-away sheath and the tip was positioned at the superior cavoatrial junction. Catheter placement was confirmed with fluoroscopy. The port was accessed and flushed with heparinized saline. The port pocket was closed using two layers of absorbable sutures and Dermabond. The vein skin site was closed using a single layer of absorbable suture and Dermabond. Sterile dressings were applied. Patient tolerated the procedure well without an  immediate complication. Ultrasound and fluoroscopic images were taken and saved for this procedure. IMPRESSION: Placement of a subcutaneous port device. This Port-A-Cath is CT injectable. Electronically Signed   By: Markus Daft M.D.   On: 03/08/2018 16:59    ASSESSMENT AND PLAN: This is a very pleasant 67 years old African-American female with metastatic poorly differentiated carcinoma highly suspicious for primary lung cancer versus head and neck cancer.  She is status post palliative radiotherapy to the large right neck mass. The patient is currently undergoing systemic chemotherapy with carboplatin, paclitaxel and Keytruda status post 1 cycle.  She tolerated the first cycle well except for the aching pain and fatigue from the Neulasta injection. I recommended for her to proceed  with cycle #2 today as a schedule. For the pain from the Neulasta injection, she will continue on treatment with Claritin, ibuprofen as well as her pain medication. For the open wound in the right neck area secondary to previous radiation, I will refer the patient to the wound clinic for further evaluation and management. For pain management she will continue with the current pain medication with OxyContin and Percocet. She will come back for follow-up visit in 3 weeks for evaluation before the next cycle of her treatment. The patient was advised to call immediately if she has any concerning symptoms in the interval. The patient voices understanding of current disease status and treatment options and is in agreement with the current care plan.  All questions were answered. The patient knows to call the clinic with any problems, questions or concerns. We can certainly see the patient much sooner if necessary.  Disclaimer: This note was dictated with voice recognition software. Similar sounding words can inadvertently be transcribed and may not be corrected upon review.

## 2018-03-31 NOTE — Progress Notes (Signed)
Brief nutrition follow-up completed with patient receiving treatment for likely lung cancer. Weight has decreased and was documented as 124 pounds on May 1 down from 127 pounds. Albumin noted at 3.1. Patient states she is trying to drink oral nutrition supplements. She denies nutrition impact symptoms.  Nutrition diagnosis: Severe malnutrition continues.  Intervention: Patient educated to continue high-calorie, high-protein foods and 6 small meals and snacks daily. Educated patient to drink to oral nutrition supplements twice daily if tolerated. Teach back method was used.  Monitoring, evaluation, goals: Patient will tolerate increased calories and protein to minimize further weight loss.  Next visit: Wednesday, June 12 during infusion.  **Disclaimer: This note was dictated with voice recognition software. Similar sounding words can inadvertently be transcribed and this note may contain transcription errors which may not have been corrected upon publication of note.**

## 2018-03-31 NOTE — Telephone Encounter (Signed)
Appointments scheduled AVS/Calendar printed per 5/1 los

## 2018-03-31 NOTE — Patient Instructions (Signed)
[facility] Discharge Instructions for Patients Receiving Chemotherapy  Today you received the following chemotherapy agents Keytruda, Taxol, and Carboplatin.   To help prevent nausea and vomiting after your treatment, we encourage you to take your nausea medication as directed.   If you develop nausea and vomiting that is not controlled by your nausea medication, call the clinic.   BELOW ARE SYMPTOMS THAT SHOULD BE REPORTED IMMEDIATELY:  *FEVER GREATER THAN 100.5 F  *CHILLS WITH OR WITHOUT FEVER  NAUSEA AND VOMITING THAT IS NOT CONTROLLED WITH YOUR NAUSEA MEDICATION  *UNUSUAL SHORTNESS OF BREATH  *UNUSUAL BRUISING OR BLEEDING  TENDERNESS IN MOUTH AND THROAT WITH OR WITHOUT PRESENCE OF ULCERS  *URINARY PROBLEMS  *BOWEL PROBLEMS  UNUSUAL RASH Items with * indicate a potential emergency and should be followed up as soon as possible.  Feel free to call the clinic should you have any questions or concerns. The clinic phone number is [phone].  Please show the [facility] at check-in to the Emergency Department and triage nurse.

## 2018-04-01 ENCOUNTER — Telehealth: Payer: Self-pay | Admitting: *Deleted

## 2018-04-01 ENCOUNTER — Encounter: Payer: Self-pay | Admitting: Radiation Oncology

## 2018-04-01 ENCOUNTER — Other Ambulatory Visit: Payer: Self-pay | Admitting: Medical Oncology

## 2018-04-01 ENCOUNTER — Other Ambulatory Visit: Payer: Self-pay | Admitting: Oncology

## 2018-04-01 NOTE — Telephone Encounter (Addendum)
I called to notify dtr and she said pt now wants to take the Neulasta injection. Appt confirmed with pts dtr.

## 2018-04-01 NOTE — Telephone Encounter (Signed)
Ok. She can do the chemo without Neulasta. I already discussed with her the higher risk of neutropenia and infection without Neulasta.

## 2018-04-01 NOTE — Telephone Encounter (Signed)
error 

## 2018-04-01 NOTE — Progress Notes (Addendum)
  Faith [name] presents for follow up of radiation completed 02/12/18 to her Right Neck.   Pain issues, if any: She reports pain to her Right side after her neulasta shot. She reports pain a 8/10 to her right shoulder Using a feeding tube?: No Weight changes, if any:  Wt Readings from Last 3 Encounters:  04/02/18 125 lb (56.7 kg)  03/31/18 124 lb (56.2 kg)  03/17/18 128 lb 6.4 oz (58.2 kg)   Swallowing issues, if any: Her mouth continues to be sore. She is eating softer foods.  Smoking or chewing tobacco? No Using fluoride trays daily? No Last ENT visit was on:  Other notable issues, if any:  Dr. Earlie Server 03/31/18, received chemotherapy as well. She will see Altamese Dilling NP 04/14/18.  Her next appointment for chemotherapy is 04/21/18 Dory Peru 03/31/18 Garald Balding next 04/05/18.  She has an open wound to her Right neck. She has a bandaid over it at this time, but leaves it open to air when at home. There is yellow drainage present, and she reports occasional bleeding.   BP (!) 126/56 (BP Location: Left Arm, Patient Position: Sitting, Cuff Size: Normal)   Pulse 70   Temp 98.3 F (36.8 C) (Oral)   Resp 20   Wt 125 lb (56.7 kg)   SpO2 100%   BMI 23.62 kg/m

## 2018-04-02 ENCOUNTER — Encounter: Payer: Self-pay | Admitting: Radiation Oncology

## 2018-04-02 ENCOUNTER — Ambulatory Visit
Admission: RE | Admit: 2018-04-02 | Discharge: 2018-04-02 | Disposition: A | Discharge status: Home / Self Care | Payer: Medicare Other | Source: Ambulatory Visit | Attending: Radiation Oncology | Admitting: Radiation Oncology

## 2018-04-02 ENCOUNTER — Inpatient Hospital Stay: Payer: Medicare Other

## 2018-04-02 ENCOUNTER — Other Ambulatory Visit: Payer: Self-pay

## 2018-04-02 VITALS — BP 115/55 | HR 80 | Temp 98.1°F | Resp 18

## 2018-04-02 VITALS — BP 126/56 | HR 70 | Temp 98.3°F | Resp 20 | Wt 125.0 lb

## 2018-04-02 DIAGNOSIS — M25511 Pain in right shoulder: Secondary | ICD-10-CM | POA: Insufficient documentation

## 2018-04-02 DIAGNOSIS — Z7982 Long term (current) use of aspirin: Secondary | ICD-10-CM | POA: Diagnosis not present

## 2018-04-02 DIAGNOSIS — Z9221 Personal history of antineoplastic chemotherapy: Secondary | ICD-10-CM | POA: Diagnosis not present

## 2018-04-02 DIAGNOSIS — C3491 Malignant neoplasm of unspecified part of right bronchus or lung: Secondary | ICD-10-CM | POA: Diagnosis not present

## 2018-04-02 DIAGNOSIS — C770 Secondary and unspecified malignant neoplasm of lymph nodes of head, face and neck: Secondary | ICD-10-CM

## 2018-04-02 DIAGNOSIS — Z923 Personal history of irradiation: Secondary | ICD-10-CM | POA: Insufficient documentation

## 2018-04-02 DIAGNOSIS — Z79899 Other long term (current) drug therapy: Secondary | ICD-10-CM | POA: Diagnosis not present

## 2018-04-02 DIAGNOSIS — Z5111 Encounter for antineoplastic chemotherapy: Secondary | ICD-10-CM | POA: Diagnosis not present

## 2018-04-02 HISTORY — DX: Personal history of irradiation: Z92.3

## 2018-04-02 MED ORDER — PEGFILGRASTIM-CBQV 6 MG/0.6ML ~~LOC~~ SOSY
PREFILLED_SYRINGE | SUBCUTANEOUS | Status: AC
  Filled 2018-04-02: qty 0.6

## 2018-04-02 MED ORDER — SILVER SULFADIAZINE 1 % EX CREA
TOPICAL_CREAM | Freq: Two times a day (BID) | CUTANEOUS | Status: DC
  Administered 2018-04-02: 17:00:00 via TOPICAL

## 2018-04-02 MED ORDER — PEGFILGRASTIM-CBQV 6 MG/0.6ML ~~LOC~~ SOSY
6.0000 mg | PREFILLED_SYRINGE | Freq: Once | SUBCUTANEOUS | Status: AC
  Administered 2018-04-02: 6 mg via SUBCUTANEOUS

## 2018-04-02 NOTE — Progress Notes (Signed)
Radiation Oncology         [phone] ________________________________  Name: Faith [name] MRN: [id number]  Date: 04/02/2018  DOB: February 11, 1951  Follow-Up Visit Note  CC: [name], MD  Curt Bears, MD  Diagnosis and Prior Radiotherapy:       ICD-10-CM   1. 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  Currently receiving chemotherapy with Dr Earlie Server  Pain issues, if any: She reports pain to her right side after her neulasta shot. She reports 8/10 pain to her right shoulder.  Using a feeding tube?: No  Weight changes, if any: Wt Readings from Last 3 Encounters:  04/02/18 125 lb (56.7 kg)  03/31/18 124 lb (56.2 kg)  03/17/18 128 lb 6.4 oz (58.2 kg)   Swallowing issues: She is eating softer foods.   Smoking or chewing tobacco? No  Using fluoride trays daily? No  Other notable issues, if any: She received chemotherapy on 03/31/2018. She will see Mikey Bussing NP on 04/14/2018. Her next appointment for chemotherapy is 04/21/2018. She will see Garald Balding next 04/05/2018. She has an open wound to her right neck. She applies neosporin to it and has a bandaid over it at this time, but leaves it open to air when at home. There is yellow drainage present, and she reports occasional bleeding.                 ALLERGIES:  is allergic to penicillins; codeine; and percocet [oxycodone-acetaminophen].  Meds: Current Outpatient Medications  Medication Sig Dispense Refill  . albuterol (PROVENTIL HFA;VENTOLIN HFA) 108 (90 Base) MCG/ACT inhaler  Inhale 1-2 puffs into the lungs every 6 (six) hours as needed for wheezing or shortness of breath.    Marland Kitchen albuterol (PROVENTIL) (2.5 MG/3ML) 0.083% nebulizer solution Take 2.5 mg by nebulization every 6 (six) hours as needed for wheezing or shortness of breath.    . AMITIZA 24 MCG capsule Take 24 mcg by mouth 2 (two) times daily.  0  . clopidogrel (PLAVIX) 75 MG tablet Take 1 tablet (75 mg total) by mouth daily. 30 tablet 0  . diclofenac sodium (VOLTAREN) 1 % GEL Apply 2 g topically daily as needed (pain).     Marland Kitchen diphenhydrAMINE (BENADRYL) 25 MG tablet Take 2 tablets (50 mg total) by mouth at bedtime as needed for sleep. 20 tablet 0  . fluticasone (FLONASE) 50 MCG/ACT nasal spray Place 2 sprays into both nostrils daily.   0  . furosemide (LASIX) 20 MG tablet Take 20 mg by mouth daily as needed for fluid or edema.     . gabapentin (NEURONTIN) 300 MG capsule Take 300 mg by mouth 3 (three) times daily.     Marland Kitchen lidocaine (XYLOCAINE) 2 % solution Caregiver: Mix 1part 2% viscous lidocaine, 1part H20. Swish & swallow 78mL of diluted mixture, 83min before meals and at bedtime, up to QID 100 mL 5  . Linaclotide (LINZESS) 290 MCG CAPS capsule Take 290-580 mcg by mouth daily as needed (constipation).     Marland Kitchen loratadine (CLARITIN) 10 MG tablet Take  10 mg by mouth daily.    . methocarbamol (ROBAXIN) 750 MG tablet Take 750 mg by mouth 2 (two) times daily as needed for muscle spasms.     . mometasone (ELOCON) 0.1 % ointment Apply 1 application topically daily as needed (irritation).     Marland Kitchen omeprazole (PRILOSEC) 20 MG capsule Take 20 mg by mouth 2 (two) times daily before a meal.    . Oxycodone HCl 20 MG TABS Take 1 tablet by mouth 4 (four) times daily as needed.  0  . oxyCODONE-acetaminophen (PERCOCET) 10-325 MG tablet Take 1 tablet by mouth every 6 (six) hours as needed for pain. 30 tablet 0  . polyethylene glycol (MIRALAX / GLYCOLAX) packet Take 17 g by mouth daily. 14 each 0  . potassium chloride 20 MEQ/15ML (10%)  SOLN Take 15 mLs by mouth daily.    . Vitamin D, Ergocalciferol, (DRISDOL) 50000 units CAPS capsule Take 50,000 Units by mouth every 7 (seven) days.    Marland Kitchen acetaminophen (TYLENOL) 325 MG tablet Take 2 tablets (650 mg total) by mouth every 6 (six) hours as needed for mild pain (or Fever >/= 101). (Patient not taking: Reported on 02/23/2018)    . aspirin EC 325 MG tablet Take 1 tablet (325 mg total) by mouth daily. (Patient not taking: Reported on 02/23/2018) 30 tablet 0  . prochlorperazine (COMPAZINE) 10 MG tablet Take 1 tablet (10 mg total) by mouth every 6 (six) hours as needed for nausea or vomiting. (Patient not taking: Reported on 04/02/2018) 30 tablet 0   Current Facility-Administered Medications  Medication Dose Route Frequency Provider Last Rate Last Dose  . silver sulfADIAZINE (SILVADENE) 1 % cream   Topical BID Eppie Gibson, MD        Physical Findings: Wt Readings from Last 3 Encounters:  04/02/18 125 lb (56.7 kg)  03/31/18 124 lb (56.2 kg)  03/17/18 128 lb 6.4 oz (58.2 kg)    weight is 125 lb (56.7 kg). Her oral temperature is 98.3 F (36.8 C). Her blood pressure is 126/56 (abnormal) and her pulse is 70. Her respiration is 20 and oxygen saturation is 100%.  General: Alert and oriented, in no acute distress. Neck: Right lower neck mass has shrunk significantly but has an open area where necrotic tumor is visible / milky fluid is seeping Skin has healed well otherwise in RT fields  Lab Findings: Lab Results  Component Value Date   WBC 9.6 03/31/2018   HGB 11.1 (L) 03/31/2018   HCT 33.1 (L) 03/31/2018   MCV 84.6 03/31/2018   PLT 321 03/31/2018    Lab Results  Component Value Date   TSH 0.960 03/31/2018    Radiographic Findings: Ir US Guide Vasc Access Left  Result Date: 03/08/2018 INDICATION: 67 year old with metastatic poorly differentiated carcinoma that is suspicious for primary lung versus head/neck cancer. Patient presents for Port-A-Cath placement. EXAM: FLUOROSCOPIC  AND ULTRASOUND GUIDED PLACEMENT OF A SUBCUTANEOUS PORT. Physician: Stephan Minister. Henn, MD MEDICATIONS: Vancomycin 1 g ANESTHESIA/SEDATION: Versed 4.0 mg IV; Fentanyl 200 mcg IV; Moderate Sedation Time:  31 minutes The patient was continuously monitored during the procedure by the interventional radiology nurse under my direct supervision. FLUOROSCOPY TIME:  48 seconds, 3 mGy COMPLICATIONS: None immediate. PROCEDURE: The risks of the procedure were explained to the patient. Informed consent was obtained. Patient was placed supine on the interventional table. Ultrasound confirmed a patent left internal jugular vein. The left chest and neck were cleaned with a skin antiseptic and a sterile drape was  placed. Maximal barrier sterile technique was utilized including caps, mask, sterile gowns, sterile gloves, sterile drape, hand hygiene and skin antiseptic. The left neck was anesthetized with 1% lidocaine. Small incision was made in the left neck with a blade. Micropuncture set was placed in the left IJ with ultrasound guidance. The micropuncture wire was used for measurement purposes. The left chest was anesthetized with 1% lidocaine with epinephrine. #15 blade was used to make an incision and a subcutaneous port pocket was formed. [place] was assembled. Subcutaneous tunnel was formed with a stiff tunneling device. The port catheter was brought through the subcutaneous tunnel. The port was placed in the subcutaneous pocket. The micropuncture set was exchanged for a peel-away sheath. The catheter was placed through the peel-away sheath and the tip was positioned at the superior cavoatrial junction. Catheter placement was confirmed with fluoroscopy. The port was accessed and flushed with heparinized saline. The port pocket was closed using two layers of absorbable sutures and Dermabond. The vein skin site was closed using a single layer of absorbable suture and Dermabond. Sterile dressings were applied. Patient  tolerated the procedure well without an immediate complication. Ultrasound and fluoroscopic images were taken and saved for this procedure. IMPRESSION: Placement of a subcutaneous port device. This Port-A-Cath is CT injectable. Electronically Signed   By: Markus Daft M.D.   On: 03/08/2018 16:59   Ir Fluoro Guide Port Insertion Left  Result Date: 03/08/2018 INDICATION: 67 year old with metastatic poorly differentiated carcinoma that is suspicious for primary lung versus head/neck cancer. Patient presents for Port-A-Cath placement. EXAM: FLUOROSCOPIC AND ULTRASOUND GUIDED PLACEMENT OF A SUBCUTANEOUS PORT. Physician: Stephan Minister. Henn, MD MEDICATIONS: Vancomycin 1 g ANESTHESIA/SEDATION: Versed 4.0 mg IV; Fentanyl 200 mcg IV; Moderate Sedation Time:  31 minutes The patient was continuously monitored during the procedure by the interventional radiology nurse under my direct supervision. FLUOROSCOPY TIME:  48 seconds, 3 mGy COMPLICATIONS: None immediate. PROCEDURE: The risks of the procedure were explained to the patient. Informed consent was obtained. Patient was placed supine on the interventional table. Ultrasound confirmed a patent left internal jugular vein. The left chest and neck were cleaned with a skin antiseptic and a sterile drape was placed. Maximal barrier sterile technique was utilized including caps, mask, sterile gowns, sterile gloves, sterile drape, hand hygiene and skin antiseptic. The left neck was anesthetized with 1% lidocaine. Small incision was made in the left neck with a blade. Micropuncture set was placed in the left IJ with ultrasound guidance. The micropuncture wire was used for measurement purposes. The left chest was anesthetized with 1% lidocaine with epinephrine. #15 blade was used to make an incision and a subcutaneous port pocket was formed. [place] was assembled. Subcutaneous tunnel was formed with a stiff tunneling device. The port catheter was brought through the subcutaneous  tunnel. The port was placed in the subcutaneous pocket. The micropuncture set was exchanged for a peel-away sheath. The catheter was placed through the peel-away sheath and the tip was positioned at the superior cavoatrial junction. Catheter placement was confirmed with fluoroscopy. The port was accessed and flushed with heparinized saline. The port pocket was closed using two layers of absorbable sutures and Dermabond. The vein skin site was closed using a single layer of absorbable suture and Dermabond. Sterile dressings were applied. Patient tolerated the procedure well without an immediate complication. Ultrasound and fluoroscopic images were taken and saved for this procedure. IMPRESSION: Placement of a subcutaneous port device. This Port-A-Cath is CT  injectable. Electronically Signed   By: Markus Daft M.D.   On: 03/08/2018 16:59    Impression/Plan:    1) Head and Neck Cancer Status: Healing from radiotherapy. Her right lower neck mass has shrunk significantly but has a whitish open wound. She was given Silvadene and instructed on how to use. She will see wound care, she states.  2) Nutritional Status: Weight stable. Saw nutrition on 03/31/2018. PEG tube: No  3) Swallowing: She is eating softer foods. She will see Garald Balding on 04/05/2018.  4) Thyroid function: WNL Lab Results  Component Value Date   TSH 0.960 03/31/2018    5) Other: Referral to PT for right shoulder pain that started after neck mass appears, persistent  6) Follow-up prn. Continue to follow with Dr. Julien Nordmann. The patient was encouraged to call with any issues or questions.  I spent 10 minutes face to face with the patient and more than 50% of that time was spent in counseling and/or coordination of care. _____________________________________   Eppie Gibson, MD  This document serves as a record of services personally performed by Eppie Gibson, MD. It was created on her behalf by Rae Lips, a trained medical scribe.  The creation of this record is based on the scribe's personal observations and the provider's statements to them. This document has been checked and approved by the attending provider.

## 2018-04-02 NOTE — Telephone Encounter (Signed)
Left message that I scheduled pt for neulasta for 4 pm

## 2018-04-03 ENCOUNTER — Other Ambulatory Visit: Payer: Self-pay | Admitting: Radiation Oncology

## 2018-04-03 ENCOUNTER — Encounter: Payer: Self-pay | Admitting: Radiation Oncology

## 2018-04-03 DIAGNOSIS — C770 Secondary and unspecified malignant neoplasm of lymph nodes of head, face and neck: Secondary | ICD-10-CM

## 2018-04-05 ENCOUNTER — Ambulatory Visit: Payer: Medicare Other | Attending: Radiation Oncology

## 2018-04-07 ENCOUNTER — Inpatient Hospital Stay: Payer: Medicare Other

## 2018-04-07 DIAGNOSIS — Z5111 Encounter for antineoplastic chemotherapy: Secondary | ICD-10-CM | POA: Diagnosis not present

## 2018-04-07 DIAGNOSIS — C3491 Malignant neoplasm of unspecified part of right bronchus or lung: Secondary | ICD-10-CM

## 2018-04-07 LAB — CMP (CANCER CENTER ONLY)
ALBUMIN: 3.5 g/dL (ref 3.5–5.0)
ALK PHOS: 147 U/L (ref 40–150)
ALT: 15 U/L (ref 0–55)
ANION GAP: 7 (ref 3–11)
AST: 20 U/L (ref 5–34)
BUN: 14 mg/dL (ref 7–26)
CALCIUM: 10.6 mg/dL — AB (ref 8.4–10.4)
CO2: 25 mmol/L (ref 22–29)
Chloride: 105 mmol/L (ref 98–109)
Creatinine: 0.78 mg/dL (ref 0.60–1.10)
GFR, Est AFR Am: >60 mL/min (ref >60)
GFR, Estimated: >60 mL/min (ref >60)
GLUCOSE: 99 mg/dL (ref 70–140)
Potassium: 3.5 mmol/L (ref 3.5–5.1)
SODIUM: 137 mmol/L (ref 136–145)
Total Bilirubin: 0.3 mg/dL (ref 0.2–1.2)
Total Protein: 7.5 g/dL (ref 6.4–8.3)

## 2018-04-07 LAB — CBC WITH DIFFERENTIAL (CANCER CENTER ONLY)
BASOS PCT: 1 %
Basophils Absolute: 0.1 10*3/uL (ref 0.0–0.1)
EOS PCT: 1 %
Eosinophils Absolute: 0.1 10*3/uL (ref 0.0–0.5)
HEMATOCRIT: 31.3 % — AB (ref 34.8–46.6)
Hemoglobin: 10.4 g/dL — ABNORMAL LOW (ref 11.6–15.9)
Lymphocytes Relative: 12 %
Lymphs Abs: 2.1 10*3/uL (ref 0.9–3.3)
MCH: 28.1 pg (ref 25.1–34.0)
MCHC: 33.3 g/dL (ref 31.5–36.0)
MCV: 84.4 fL (ref 79.5–101.0)
MONO ABS: 2.7 10*3/uL — AB (ref 0.1–0.9)
Monocytes Relative: 15 %
NEUTROS ABS: 12.8 10*3/uL — AB (ref 1.5–6.5)
Neutrophils Relative %: 71 %
Platelet Count: 224 10*3/uL (ref 145–400)
RBC: 3.71 MIL/uL (ref 3.70–5.45)
RDW: 17.2 % — AB (ref 11.2–14.5)
WBC Count: 17.8 10*3/uL — ABNORMAL HIGH (ref 3.9–10.3)

## 2018-04-08 ENCOUNTER — Encounter: Payer: Self-pay | Admitting: *Deleted

## 2018-04-08 NOTE — Progress Notes (Signed)
Oncology Nurse Navigator Documentation  Flowsheet update.  Gayleen Orem, RN, BSN Head & Neck Oncology Nurse [place] [phone]

## 2018-04-13 ENCOUNTER — Encounter (HOSPITAL_BASED_OUTPATIENT_CLINIC_OR_DEPARTMENT_OTHER): Payer: Medicare Other | Attending: Internal Medicine

## 2018-04-13 DIAGNOSIS — T8131XA Disruption of external operation (surgical) wound, not elsewhere classified, initial encounter: Secondary | ICD-10-CM | POA: Diagnosis present

## 2018-04-13 DIAGNOSIS — Y838 Other surgical procedures as the cause of abnormal reaction of the patient, or of later complication, without mention of misadventure at the time of the procedure: Secondary | ICD-10-CM | POA: Insufficient documentation

## 2018-04-13 DIAGNOSIS — Z923 Personal history of irradiation: Secondary | ICD-10-CM | POA: Insufficient documentation

## 2018-04-13 DIAGNOSIS — C3490 Malignant neoplasm of unspecified part of unspecified bronchus or lung: Secondary | ICD-10-CM | POA: Diagnosis not present

## 2018-04-13 DIAGNOSIS — L98498 Non-pressure chronic ulcer of skin of other sites with other specified severity: Secondary | ICD-10-CM | POA: Diagnosis not present

## 2018-04-13 DIAGNOSIS — I10 Essential (primary) hypertension: Secondary | ICD-10-CM | POA: Diagnosis not present

## 2018-04-13 DIAGNOSIS — Z79899 Other long term (current) drug therapy: Secondary | ICD-10-CM | POA: Diagnosis not present

## 2018-04-13 DIAGNOSIS — C7989 Secondary malignant neoplasm of other specified sites: Secondary | ICD-10-CM | POA: Insufficient documentation

## 2018-04-14 ENCOUNTER — Inpatient Hospital Stay: Payer: Medicare Other | Admitting: Oncology

## 2018-04-14 ENCOUNTER — Inpatient Hospital Stay: Payer: Medicare Other

## 2018-04-14 ENCOUNTER — Other Ambulatory Visit: Payer: Medicare Other

## 2018-04-14 DIAGNOSIS — C760 Malignant neoplasm of head, face and neck: Secondary | ICD-10-CM

## 2018-04-14 DIAGNOSIS — Z5111 Encounter for antineoplastic chemotherapy: Secondary | ICD-10-CM | POA: Diagnosis not present

## 2018-04-14 LAB — CMP (CANCER CENTER ONLY)
ALBUMIN: 3.4 g/dL — AB (ref 3.5–5.0)
ALT: 12 U/L (ref 0–55)
AST: 15 U/L (ref 5–34)
Alkaline Phosphatase: 130 U/L (ref 40–150)
Anion gap: 6 (ref 3–11)
BUN: 17 mg/dL (ref 7–26)
CO2: 26 mmol/L (ref 22–29)
CREATININE: 0.64 mg/dL (ref 0.60–1.10)
Calcium: 9.5 mg/dL (ref 8.4–10.4)
Chloride: 106 mmol/L (ref 98–109)
GFR, Est AFR Am: >60 mL/min (ref >60)
GLUCOSE: 82 mg/dL (ref 70–140)
Potassium: 4.4 mmol/L (ref 3.5–5.1)
SODIUM: 138 mmol/L (ref 136–145)
Total Bilirubin: <0.2 mg/dL — ABNORMAL LOW (ref 0.2–1.2)
Total Protein: 7.3 g/dL (ref 6.4–8.3)

## 2018-04-14 LAB — CBC WITH DIFFERENTIAL (CANCER CENTER ONLY)
Basophils Absolute: 0 10*3/uL (ref 0.0–0.1)
Basophils Relative: 0 %
EOS ABS: 0 10*3/uL (ref 0.0–0.5)
EOS PCT: 0 %
HCT: 32 % — ABNORMAL LOW (ref 34.8–46.6)
Hemoglobin: 10.6 g/dL — ABNORMAL LOW (ref 11.6–15.9)
LYMPHS ABS: 1.8 10*3/uL (ref 0.9–3.3)
Lymphocytes Relative: 14 %
MCH: 28.6 pg (ref 25.1–34.0)
MCHC: 33.2 g/dL (ref 31.5–36.0)
MCV: 86.1 fL (ref 79.5–101.0)
MONO ABS: 1.2 10*3/uL — AB (ref 0.1–0.9)
MONOS PCT: 9 %
Neutro Abs: 10.2 10*3/uL — ABNORMAL HIGH (ref 1.5–6.5)
Neutrophils Relative %: 77 %
PLATELETS: 193 10*3/uL (ref 145–400)
RBC: 3.72 MIL/uL (ref 3.70–5.45)
RDW: 18 % — ABNORMAL HIGH (ref 11.2–14.5)
WBC Count: 13.2 10*3/uL — ABNORMAL HIGH (ref 3.9–10.3)

## 2018-04-21 ENCOUNTER — Inpatient Hospital Stay: Payer: Medicare Other

## 2018-04-21 ENCOUNTER — Inpatient Hospital Stay (HOSPITAL_BASED_OUTPATIENT_CLINIC_OR_DEPARTMENT_OTHER): Payer: Medicare Other | Admitting: Oncology

## 2018-04-21 ENCOUNTER — Encounter: Payer: Self-pay | Admitting: Oncology

## 2018-04-21 ENCOUNTER — Telehealth: Payer: Self-pay | Admitting: Oncology

## 2018-04-21 VITALS — BP 134/67 | HR 89 | Temp 98.0°F | Resp 18 | Ht 61.0 in | Wt 118.2 lb

## 2018-04-21 DIAGNOSIS — L598 Other specified disorders of the skin and subcutaneous tissue related to radiation: Secondary | ICD-10-CM

## 2018-04-21 DIAGNOSIS — Z923 Personal history of irradiation: Secondary | ICD-10-CM

## 2018-04-21 DIAGNOSIS — Z5111 Encounter for antineoplastic chemotherapy: Secondary | ICD-10-CM

## 2018-04-21 DIAGNOSIS — I10 Essential (primary) hypertension: Secondary | ICD-10-CM | POA: Diagnosis not present

## 2018-04-21 DIAGNOSIS — R5382 Chronic fatigue, unspecified: Secondary | ICD-10-CM

## 2018-04-21 DIAGNOSIS — C7989 Secondary malignant neoplasm of other specified sites: Secondary | ICD-10-CM | POA: Diagnosis not present

## 2018-04-21 DIAGNOSIS — R530 Neoplastic (malignant) related fatigue: Secondary | ICD-10-CM | POA: Diagnosis not present

## 2018-04-21 DIAGNOSIS — M2550 Pain in unspecified joint: Secondary | ICD-10-CM | POA: Diagnosis not present

## 2018-04-21 DIAGNOSIS — Z515 Encounter for palliative care: Secondary | ICD-10-CM | POA: Insufficient documentation

## 2018-04-21 DIAGNOSIS — C3491 Malignant neoplasm of unspecified part of right bronchus or lung: Secondary | ICD-10-CM

## 2018-04-21 DIAGNOSIS — Z5112 Encounter for antineoplastic immunotherapy: Secondary | ICD-10-CM

## 2018-04-21 LAB — CBC WITH DIFFERENTIAL (CANCER CENTER ONLY)
BASOS PCT: 1 %
Basophils Absolute: 0.1 10*3/uL (ref 0.0–0.1)
EOS ABS: 0 10*3/uL (ref 0.0–0.5)
Eosinophils Relative: 0 %
HEMATOCRIT: 35.6 % (ref 34.8–46.6)
HEMOGLOBIN: 12 g/dL (ref 11.6–15.9)
LYMPHS ABS: 1.2 10*3/uL (ref 0.9–3.3)
Lymphocytes Relative: 14 %
MCH: 29.1 pg (ref 25.1–34.0)
MCHC: 33.8 g/dL (ref 31.5–36.0)
MCV: 86.3 fL (ref 79.5–101.0)
MONOS PCT: 9 %
Monocytes Absolute: 0.8 10*3/uL (ref 0.1–0.9)
NEUTROS ABS: 6.5 10*3/uL (ref 1.5–6.5)
NEUTROS PCT: 76 %
Platelet Count: 251 10*3/uL (ref 145–400)
RBC: 4.13 MIL/uL (ref 3.70–5.45)
RDW: 17.9 % — ABNORMAL HIGH (ref 11.2–14.5)
WBC: 8.6 10*3/uL (ref 3.9–10.3)

## 2018-04-21 LAB — CMP (CANCER CENTER ONLY)
ALT: 20 U/L (ref 0–55)
ANION GAP: 8 (ref 3–11)
AST: 19 U/L (ref 5–34)
Albumin: 3.6 g/dL (ref 3.5–5.0)
Alkaline Phosphatase: 123 U/L (ref 40–150)
BUN: 13 mg/dL (ref 7–26)
CALCIUM: 9.8 mg/dL (ref 8.4–10.4)
CHLORIDE: 108 mmol/L (ref 98–109)
CO2: 19 mmol/L — ABNORMAL LOW (ref 22–29)
CREATININE: 0.69 mg/dL (ref 0.60–1.10)
Glucose, Bld: 127 mg/dL (ref 70–140)
Potassium: 3.5 mmol/L (ref 3.5–5.1)
SODIUM: 135 mmol/L — AB (ref 136–145)
Total Bilirubin: 0.4 mg/dL (ref 0.2–1.2)
Total Protein: 7.8 g/dL (ref 6.4–8.3)

## 2018-04-21 LAB — TSH: TSH: 1.451 u[IU]/mL (ref 0.308–3.960)

## 2018-04-21 MED ORDER — PALONOSETRON HCL INJECTION 0.25 MG/5ML
INTRAVENOUS | Status: AC
Start: 2018-04-21 — End: ?
  Filled 2018-04-21: qty 5

## 2018-04-21 MED ORDER — DIPHENHYDRAMINE HCL 50 MG/ML IJ SOLN
INTRAMUSCULAR | Status: AC
  Filled 2018-04-21: qty 1

## 2018-04-21 MED ORDER — SODIUM CHLORIDE 0.9% FLUSH
10.0000 mL | INTRAVENOUS | Status: DC | PRN
  Administered 2018-04-21: 10 mL
  Filled 2018-04-21: qty 10

## 2018-04-21 MED ORDER — SODIUM CHLORIDE 0.9 % IV SOLN
383.0000 mg | Freq: Once | INTRAVENOUS | Status: AC
  Administered 2018-04-21: 380 mg via INTRAVENOUS
  Filled 2018-04-21: qty 38

## 2018-04-21 MED ORDER — FAMOTIDINE IN NACL 20-0.9 MG/50ML-% IV SOLN
INTRAVENOUS | Status: AC
  Filled 2018-04-21: qty 50

## 2018-04-21 MED ORDER — SODIUM CHLORIDE 0.9 % IV SOLN
200.0000 mg | Freq: Once | INTRAVENOUS | Status: AC
  Administered 2018-04-21: 200 mg via INTRAVENOUS
  Filled 2018-04-21: qty 8

## 2018-04-21 MED ORDER — FAMOTIDINE IN NACL 20-0.9 MG/50ML-% IV SOLN
20.0000 mg | Freq: Once | INTRAVENOUS | Status: AC
  Administered 2018-04-21: 20 mg via INTRAVENOUS

## 2018-04-21 MED ORDER — SODIUM CHLORIDE 0.9 % IV SOLN
Freq: Once | INTRAVENOUS | Status: AC
  Administered 2018-04-21: 12:00:00 via INTRAVENOUS

## 2018-04-21 MED ORDER — DIPHENHYDRAMINE HCL 50 MG/ML IJ SOLN
50.0000 mg | Freq: Once | INTRAMUSCULAR | Status: AC
  Administered 2018-04-21: 50 mg via INTRAVENOUS

## 2018-04-21 MED ORDER — SODIUM CHLORIDE 0.9 % IV SOLN
175.0000 mg/m2 | Freq: Once | INTRAVENOUS | Status: AC
  Administered 2018-04-21: 276 mg via INTRAVENOUS
  Filled 2018-04-21: qty 46

## 2018-04-21 MED ORDER — PALONOSETRON HCL INJECTION 0.25 MG/5ML
0.2500 mg | Freq: Once | INTRAVENOUS | Status: AC
  Administered 2018-04-21: 0.25 mg via INTRAVENOUS

## 2018-04-21 MED ORDER — LIDOCAINE-PRILOCAINE 2.5-2.5 % EX CREA: 1.0000 "application " | TOPICAL_CREAM | CUTANEOUS | 1 refills | Status: DC | PRN

## 2018-04-21 MED ORDER — HEPARIN SOD (PORK) LOCK FLUSH 100 UNIT/ML IV SOLN
500.0000 [IU] | Freq: Once | INTRAVENOUS | Status: AC | PRN
  Administered 2018-04-21: 500 [IU]
  Filled 2018-04-21: qty 5

## 2018-04-21 MED ORDER — SODIUM CHLORIDE 0.9 % IV SOLN
Freq: Once | INTRAVENOUS | Status: AC
  Administered 2018-04-21: 12:00:00 via INTRAVENOUS
  Filled 2018-04-21: qty 5

## 2018-04-21 NOTE — Progress Notes (Signed)
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  She presented with large central necrotic right lower neck mass in addition to bulky mediastinal and bilateral hilar adenopathy as well as a small hypermetabolic lung lesion diagnosed in February 2019.  PRIOR THERAPY: Palliative radiotherapy to the large necrotic mass of the right neck under the care of Dr. Isidore Moos, completed on February 19, 2018.  CURRENT THERAPY: Palliative systemic chemotherapy with carboplatin for AUC of 5, paclitaxel 175 mg/M2 and Keytruda 200 mg IV every 3 weeks.  First dose 03/03/2018.  Status post 2 cycles.  INTERVAL HISTORY: Faith [name] 67 y.o. female returns for a routine follow-up visit accompanied by her granddaughter the patient tolerated her last cycle of chemotherapy fairly well with the exception of arthralgias related to her Neulasta injection.  The patient denies fevers and chills.  Denies chest pain, shortness of breath, cough, hemoptysis.  Denies nausea, vomiting, constipation, diarrhea.  Patient continues to have an open wound to her right neck area due to her prior radiation.  She has been seen by the wound center.  The patient is here for evaluation prior to cycle #3 of her treatment.  MEDICAL HISTORY: Past Medical History:  Diagnosis Date  . Anxiety   . Arthritis    "thighs; legs; hips" (07/05/2014)  . Bipolar disorder ([facility])   . Cholelithiasis 07/2014  . Chronic bronchitis ([facility])    "get it q yr"  . Chronic lower back pain   . Depression    pt. use to go to depression clinic, states she still has depression & anxiety but can't get to appt. so she hasn't had any med. for it in a while   . Dyspnea   . GERD (gastroesophageal reflux disease)   . High cholesterol   . History of radiation therapy 02/01/18- 02/12/18   Right Neck/ 30 Gy in 10  fractions.   . Hypertension   . Mass in neck 12/2017   RIGHT SIDE OF NECK  . Peripheral vascular disease ([facility])   . Schizophrenia ([facility])   . Stroke [facility])     ALLERGIES:  is allergic to penicillins; codeine; and percocet [oxycodone-acetaminophen].  MEDICATIONS:  Current Outpatient Medications  Medication Sig Dispense Refill  . acetaminophen (TYLENOL) 325 MG tablet Take 2 tablets (650 mg total) by mouth every 6 (six) hours as needed for mild pain (or Fever >/= 101). (Patient not taking: Reported on 02/23/2018)    . albuterol (PROVENTIL HFA;VENTOLIN HFA) 108 (90 Base) MCG/ACT inhaler Inhale 1-2 puffs into the lungs every 6 (six) hours as needed for wheezing or shortness of breath.    Marland Kitchen albuterol (PROVENTIL) (2.5 MG/3ML) 0.083% nebulizer solution Take 2.5 mg by nebulization every 6 (six) hours as needed for wheezing or shortness of breath.    . AMITIZA 24 MCG capsule Take 24 mcg by mouth 2 (two) times daily.  0  . aspirin EC 325 MG tablet Take 1 tablet (325 mg total) by mouth daily. (Patient not taking: Reported on 02/23/2018) 30 tablet 0  . clopidogrel (PLAVIX) 75 MG tablet Take 1 tablet (75 mg total) by mouth daily. 30 tablet 0  . diclofenac sodium (VOLTAREN) 1 % GEL Apply 2 g topically daily as needed (pain).     Marland Kitchen diphenhydrAMINE (BENADRYL) 25 MG tablet Take 2 tablets (50 mg total) by mouth at bedtime as needed for  sleep. 20 tablet 0  . fluticasone (FLONASE) 50 MCG/ACT nasal spray Place 2 sprays into both nostrils daily.   0  . furosemide (LASIX) 20 MG tablet Take 20 mg by mouth daily as needed for fluid or edema.     . gabapentin (NEURONTIN) 300 MG capsule Take 300 mg by mouth 3 (three) times daily.     Marland Kitchen lidocaine (XYLOCAINE) 2 % solution Caregiver: Mix 1part 2% viscous lidocaine, 1part H20. Swish & swallow 27mL of diluted mixture, 45min before meals and at bedtime, up to QID 100 mL 5  . lidocaine-prilocaine (EMLA) cream Apply 1 application topically as needed. 30 g 1  . Linaclotide  (LINZESS) 290 MCG CAPS capsule Take 290-580 mcg by mouth daily as needed (constipation).     Marland Kitchen loratadine (CLARITIN) 10 MG tablet Take 10 mg by mouth daily.    . methocarbamol (ROBAXIN) 750 MG tablet Take 750 mg by mouth 2 (two) times daily as needed for muscle spasms.     . mometasone (ELOCON) 0.1 % ointment Apply 1 application topically daily as needed (irritation).     Marland Kitchen omeprazole (PRILOSEC) 20 MG capsule Take 20 mg by mouth 2 (two) times daily before a meal.    . Oxycodone HCl 20 MG TABS Take 1 tablet by mouth 4 (four) times daily as needed.  0  . oxyCODONE-acetaminophen (PERCOCET) 10-325 MG tablet Take 1 tablet by mouth every 6 (six) hours as needed for pain. 30 tablet 0  . polyethylene glycol (MIRALAX / GLYCOLAX) packet Take 17 g by mouth daily. 14 each 0  . potassium chloride 20 MEQ/15ML (10%) SOLN Take 15 mLs by mouth daily.    . prochlorperazine (COMPAZINE) 10 MG tablet Take 1 tablet (10 mg total) by mouth every 6 (six) hours as needed for nausea or vomiting. (Patient not taking: Reported on 04/02/2018) 30 tablet 0  . Vitamin D, Ergocalciferol, (DRISDOL) 50000 units CAPS capsule Take 50,000 Units by mouth every 7 (seven) days.     No current facility-administered medications for this visit.    Facility-Administered Medications Ordered in Other Visits  Medication Dose Route Frequency Provider Last Rate Last Dose  . CARBOplatin (PARAPLATIN) 380 mg in sodium chloride 0.9 % 250 mL chemo infusion  380 mg Intravenous Once Curt Bears, MD      . heparin lock flush 100 unit/mL  500 Units Intracatheter Once PRN Curt Bears, MD      . PACLitaxel (TAXOL) 276 mg in sodium chloride 0.9 % 250 mL chemo infusion (> 80mg /m2)  175 mg/m2 (Treatment Plan Recorded) Intravenous Once Curt Bears, MD 99 mL/hr at 04/21/18 1318 276 mg at 04/21/18 1318  . sodium chloride flush (NS) 0.9 % injection 10 mL  10 mL Intracatheter PRN Curt Bears, MD        SURGICAL HISTORY:  Past Surgical History:   Procedure Laterality Date  . ABDOMINAL AORTOGRAM N/A 01/07/2017   Procedure: Abdominal Aortogram;  Surgeon: Waynetta Sandy, MD;  Location: [facility];  Service: Cardiovascular;  Laterality: N/A;  . ABDOMINAL HYSTERECTOMY    . ANKLE FRACTURE SURGERY Right   . ANKLE HARDWARE REMOVAL Right   . APPENDECTOMY  ~ 1963  . BREAST BIOPSY Bilateral   . BREAST CYST EXCISION Bilateral    "not cancer"  . CATARACT EXTRACTION W/ INTRAOCULAR LENS  IMPLANT, BILATERAL    . CHOLECYSTECTOMY N/A 07/05/2014   Procedure: LAPAROSCOPIC CHOLECYSTECTOMY WITH INTRAOPERATIVE CHOLANGIOGRAM;  Surgeon: Gayland Curry, MD;  Location: [facility];  Service:  General;  Laterality: N/A;  . EXCISIONAL HEMORRHOIDECTOMY    . FEMORAL-POPLITEAL BYPASS GRAFT Right 01/15/2017   Procedure: BYPASS GRAFT FEMORAL-POPLITEAL ARTERY RIGHT LEG;  Surgeon: Waynetta Sandy, MD;  Location: [facility];  Service: Vascular;  Laterality: Right;  . IR FLUORO GUIDE PORT INSERTION LEFT  03/08/2018  . IR US GUIDE VASC ACCESS LEFT  03/08/2018  . LAPAROSCOPIC CHOLECYSTECTOMY  07/05/2014  . LOWER EXTREMITY ANGIOGRAPHY Bilateral 01/07/2017   Procedure: Lower Extremity Angiography;  Surgeon: Waynetta Sandy, MD;  Location: [facility];  Service: Cardiovascular;  Laterality: Bilateral;  . MASS BIOPSY Right 12/25/2017   Procedure: INCISIONAL RIGHT NECK MASS BIOPSY;  Surgeon: Helayne Seminole, MD;  Location: [facility];  Service: ENT;  Laterality: Right;  . MULTIPLE TOOTH EXTRACTIONS  05/2013   "pulled my upper teeth"  . PILONIDAL CYST EXCISION      REVIEW OF SYSTEMS:   Review of Systems  Constitutional: Negative for chills, fatigue, fever.  Positive for decreased appetite and weight loss.  HENT:   Negative for mouth sores, nosebleeds, sore throat and trouble swallowing.  Positive for mass to her right neck. Eyes: Negative for eye problems and icterus.  Respiratory: Negative for cough, hemoptysis, shortness of breath and wheezing.    Cardiovascular: Negative for chest pain and leg swelling.  Gastrointestinal: Negative for abdominal pain, constipation, diarrhea, nausea and vomiting.  Genitourinary: Negative for bladder incontinence, difficulty urinating, dysuria, frequency and hematuria.   Musculoskeletal: Negative for back pain, gait problem, neck pain and neck stiffness.  Skin: Negative for itching and rash.  Neurological: Negative for dizziness, extremity weakness, gait problem, headaches, light-headedness and seizures.  Hematological: Negative for adenopathy. Does not bruise/bleed easily.  Psychiatric/Behavioral: Negative for confusion, depression and sleep disturbance. The patient is not nervous/anxious.     PHYSICAL EXAMINATION:  Blood pressure 134/67, pulse 89, temperature 98 F (36.7 C), temperature source Oral, resp. rate 18, height 5\' 1"  (1.549 m), weight 118 lb 3.2 oz (53.6 kg), SpO2 100 %.  ECOG PERFORMANCE STATUS: 1 - Symptomatic but completely ambulatory  Physical Exam  Constitutional: Oriented to person, place, and time and well-developed, well-nourished, and in no distress. No distress.  HENT:  Head: Normocephalic and atraumatic.  Mouth/Throat: Oropharynx is clear and moist. No oropharyngeal exudate.  Eyes: Conjunctivae are normal. Right eye exhibits no discharge. Left eye exhibits no discharge. No scleral icterus.  Neck: Normal range of motion.  Open wound to the right neck due to prior radiation.  Cardiovascular: Normal rate, regular rhythm, normal heart sounds and intact distal pulses.   Pulmonary/Chest: Effort normal and breath sounds normal. No respiratory distress. No wheezes. No rales.  Abdominal: Soft. Bowel sounds are normal. Exhibits no distension and no mass. There is no tenderness.  Musculoskeletal: Normal range of motion. Exhibits no edema.  Lymphadenopathy:    No cervical adenopathy.  Neurological: Alert and oriented to person, place, and time. Exhibits normal muscle tone. Gait normal.  Coordination normal.  Skin: Skin is warm and dry. No rash noted. Not diaphoretic. No erythema. No pallor.  Psychiatric: Mood, memory and judgment normal.  Vitals reviewed.  LABORATORY DATA: Lab Results  Component Value Date   WBC 8.6 04/21/2018   HGB 12.0 04/21/2018   HCT 35.6 04/21/2018   MCV 86.3 04/21/2018   PLT 251 04/21/2018      Chemistry      Component Value Date/Time   NA 135 (L) 04/21/2018 0935   K 3.5 04/21/2018 0935   CL 108 04/21/2018  0935   CO2 19 (L) 04/21/2018 0935   BUN 13 04/21/2018 0935   CREATININE 0.69 04/21/2018 0935      Component Value Date/Time   CALCIUM 9.8 04/21/2018 0935   ALKPHOS 123 04/21/2018 0935   AST 19 04/21/2018 0935   ALT 20 04/21/2018 0935   BILITOT 0.4 04/21/2018 0935       RADIOGRAPHIC STUDIES:  No results found.   ASSESSMENT/PLAN:  Non-small cell carcinoma of right lung, stage 4 (HCC) This is a very pleasant 67 year old African-American female with metastatic poorly differentiated carcinoma highly suspicious for primary lung cancer versus head and neck cancer.  She is status post palliative radiotherapy to the large right neck mass. The patient is currently undergoing systemic chemotherapy with carboplatin, paclitaxel and Keytruda status post 2cycles.  She  is tolerating her treatment well with the exception of aching pain and fatigue from the Neulasta injection. I recommended for her to proceed with cycle #3 today as a scheduled. The patient will follow-up in 3 weeks for evaluation prior to cycle 4 of her treatment. The patient will have a restaging CT scan of the neck, chest, abdomen, pelvis prior to her next visit.  For the pain from the Neulasta injection, she will continue on treatment with Claritin, ibuprofen as well as her pain medication.  For the open wound in the right neck area secondary to previous radiation.  She is being followed by the wound care clinic. . For pain management she will continue with the current  pain medication with OxyContin and Percocet.  Pain medication is managed by a local pain clinic.  She will come back for follow-up visit in 3 weeks for evaluation before the next cycle of her treatment.  She will have weekly labs while on treatment.  The patient was advised to call immediately if she has any concerning symptoms in the interval. The patient voices understanding of current disease status and treatment options and is in agreement with the current care plan.  All questions were answered. The patient knows to call the clinic with any problems, questions or concerns. We can certainly see the patient much sooner if necessary.   Orders Placed This Encounter  Procedures  . CT CHEST W CONTRAST    Standing Status:   Future    Standing Expiration Date:   04/22/2019    Order Specific Question:   If indicated for the ordered procedure, I authorize the administration of contrast media per Radiology protocol    Answer:   Yes    Order Specific Question:   Preferred imaging location?    Answer:   [facility]    Order Specific Question:   Radiology Contrast Protocol - do NOT remove file path    Answer:   \\[email or web address]    Order Specific Question:   Reason for Exam additional comments    Answer:   Stage IV lung cancer with neck mass.  . CT ABDOMEN PELVIS W CONTRAST    Standing Status:   Future    Standing Expiration Date:   04/22/2019    Order Specific Question:   If indicated for the ordered procedure, I authorize the administration of contrast media per Radiology protocol    Answer:   Yes    Order Specific Question:   Preferred imaging location?    Answer:   [facility]    Order Specific Question:   Radiology Contrast Protocol - do NOT remove file path    Answer:   \\  [email or web address]    Order Specific Question:   Reason for Exam additional comments    Answer:   Stage IV lung cancer with neck mass.  . CT Soft  Tissue Neck W Contrast    Standing Status:   Future    Standing Expiration Date:   04/21/2019    Order Specific Question:   If indicated for the ordered procedure, I authorize the administration of contrast media per Radiology protocol    Answer:   Yes    Order Specific Question:   Preferred imaging location?    Answer:   [facility]    Order Specific Question:   Radiology Contrast Protocol - do NOT remove file path    Answer:   \\[email or web address]    Order Specific Question:   Reason for Exam additional comments    Answer:   Stage IV lung cancer with neck mass.   Mikey Bussing, DNP, AGPCNP-BC, AOCNP 04/21/18

## 2018-04-21 NOTE — Assessment & Plan Note (Signed)
This is a very pleasant 67 year old African-American female with metastatic poorly differentiated carcinoma highly suspicious for primary lung cancer versus head and neck cancer.  She is status post palliative radiotherapy to the large right neck mass. The patient is currently undergoing systemic chemotherapy with carboplatin, paclitaxel and Keytruda status post 2cycles.  She is tolerating her treatment well with the exception of aching pain and fatigue from the Neulasta injection. I recommended for her to proceed with cycle #3 today as a scheduled. The patient will follow-up in 3 weeks for evaluation prior to cycle 4 of her treatment. The patient will have a restaging CT scan of the neck, chest, abdomen, pelvis prior to her next visit.  For the pain from the Neulasta injection, she will continue on treatment with Claritin, ibuprofen as well as her pain medication.  For the open wound in the right neck area secondary to previous radiation.  She is being followed by the wound care clinic. . For pain management she will continue with the current pain medication with OxyContin and Percocet.  Pain medication is managed by a local pain clinic.  She will come back for follow-up visit in 3 weeks for evaluation before the next cycle of her treatment.  She will have weekly labs while on treatment.  The patient was advised to call immediately if she has any concerning symptoms in the interval. The patient voices understanding of current disease status and treatment options and is in agreement with the current care plan.  All questions were answered. The patient knows to call the clinic with any problems, questions or concerns. We can certainly see the patient much sooner if necessary.

## 2018-04-21 NOTE — Patient Instructions (Signed)
[facility] Discharge Instructions for Patients Receiving Chemotherapy  Today you received the following chemotherapy agents Keytruda, Taxol, and Carboplatin.   To help prevent nausea and vomiting after your treatment, we encourage you to take your nausea medication as directed.   If you develop nausea and vomiting that is not controlled by your nausea medication, call the clinic.   BELOW ARE SYMPTOMS THAT SHOULD BE REPORTED IMMEDIATELY:  *FEVER GREATER THAN 100.5 F  *CHILLS WITH OR WITHOUT FEVER  NAUSEA AND VOMITING THAT IS NOT CONTROLLED WITH YOUR NAUSEA MEDICATION  *UNUSUAL SHORTNESS OF BREATH  *UNUSUAL BRUISING OR BLEEDING  TENDERNESS IN MOUTH AND THROAT WITH OR WITHOUT PRESENCE OF ULCERS  *URINARY PROBLEMS  *BOWEL PROBLEMS  UNUSUAL RASH Items with * indicate a potential emergency and should be followed up as soon as possible.  Feel free to call the clinic should you have any questions or concerns. The clinic phone number is [phone].  Please show the [facility] at check-in to the Emergency Department and triage nurse.

## 2018-04-21 NOTE — Telephone Encounter (Signed)
Scheduled appt per 5/22 los - gave patient AVS and calender per los.

## 2018-04-22 DIAGNOSIS — T8131XA Disruption of external operation (surgical) wound, not elsewhere classified, initial encounter: Secondary | ICD-10-CM | POA: Diagnosis not present

## 2018-04-23 ENCOUNTER — Ambulatory Visit: Payer: Medicare Other

## 2018-04-23 ENCOUNTER — Inpatient Hospital Stay: Payer: Medicare Other

## 2018-04-23 DIAGNOSIS — C3491 Malignant neoplasm of unspecified part of right bronchus or lung: Secondary | ICD-10-CM

## 2018-04-23 DIAGNOSIS — Z5111 Encounter for antineoplastic chemotherapy: Secondary | ICD-10-CM | POA: Diagnosis not present

## 2018-04-23 MED ORDER — PEGFILGRASTIM-CBQV 6 MG/0.6ML ~~LOC~~ SOSY
PREFILLED_SYRINGE | SUBCUTANEOUS | Status: AC
  Filled 2018-04-23: qty 0.6

## 2018-04-23 MED ORDER — PEGFILGRASTIM-CBQV 6 MG/0.6ML ~~LOC~~ SOSY
6.0000 mg | PREFILLED_SYRINGE | Freq: Once | SUBCUTANEOUS | Status: AC
  Administered 2018-04-23: 6 mg via SUBCUTANEOUS

## 2018-04-27 ENCOUNTER — Telehealth: Payer: Self-pay | Admitting: Medical Oncology

## 2018-04-27 NOTE — Telephone Encounter (Signed)
Faith [name] is concerned about lack of communication about number of tx. She was told 4 rounds of tx every 3 weeks.and now she sees she has more tx scheduled. She has job commitments and transportation and if she had been told 6 cycles she could have asked off for work ahead of time . She had already planned the first 4 tx off with her job. PT thinks her last treatment is in June.

## 2018-04-28 ENCOUNTER — Other Ambulatory Visit: Payer: Medicare Other

## 2018-04-28 ENCOUNTER — Ambulatory Visit: Payer: Medicare Other | Admitting: Oncology

## 2018-04-28 ENCOUNTER — Ambulatory Visit (HOSPITAL_COMMUNITY)
Admission: RE | Admit: 2018-04-28 | Discharge: 2018-04-28 | Disposition: A | Discharge status: Home / Self Care | Payer: Medicare Other | Source: Ambulatory Visit | Attending: Oncology | Admitting: Oncology

## 2018-04-28 ENCOUNTER — Encounter (HOSPITAL_COMMUNITY): Payer: Self-pay | Admitting: Radiology

## 2018-04-28 ENCOUNTER — Inpatient Hospital Stay: Payer: Medicare Other

## 2018-04-28 DIAGNOSIS — Z5111 Encounter for antineoplastic chemotherapy: Secondary | ICD-10-CM | POA: Diagnosis not present

## 2018-04-28 DIAGNOSIS — Z95828 Presence of other vascular implants and grafts: Secondary | ICD-10-CM

## 2018-04-28 DIAGNOSIS — C3491 Malignant neoplasm of unspecified part of right bronchus or lung: Secondary | ICD-10-CM | POA: Diagnosis not present

## 2018-04-28 DIAGNOSIS — R590 Localized enlarged lymph nodes: Secondary | ICD-10-CM | POA: Insufficient documentation

## 2018-04-28 DIAGNOSIS — R918 Other nonspecific abnormal finding of lung field: Secondary | ICD-10-CM | POA: Diagnosis not present

## 2018-04-28 DIAGNOSIS — I6522 Occlusion and stenosis of left carotid artery: Secondary | ICD-10-CM | POA: Insufficient documentation

## 2018-04-28 LAB — CMP (CANCER CENTER ONLY)
ALT: 10 U/L (ref 0–55)
AST: 15 U/L (ref 5–34)
Albumin: 3.4 g/dL — ABNORMAL LOW (ref 3.5–5.0)
Alkaline Phosphatase: 138 U/L (ref 40–150)
Anion gap: 10 (ref 3–11)
BUN: 16 mg/dL (ref 7–26)
CHLORIDE: 110 mmol/L — AB (ref 98–109)
CO2: 21 mmol/L — ABNORMAL LOW (ref 22–29)
CREATININE: 0.63 mg/dL (ref 0.60–1.10)
Calcium: 9.8 mg/dL (ref 8.4–10.4)
Glucose, Bld: 103 mg/dL (ref 70–140)
POTASSIUM: 4.1 mmol/L (ref 3.5–5.1)
Sodium: 141 mmol/L (ref 136–145)
Total Bilirubin: 0.3 mg/dL (ref 0.2–1.2)
Total Protein: 6.9 g/dL (ref 6.4–8.3)

## 2018-04-28 LAB — CBC WITH DIFFERENTIAL (CANCER CENTER ONLY)
Basophils Absolute: 0 10*3/uL (ref 0.0–0.1)
Basophils Relative: 0 %
EOS PCT: 1 %
Eosinophils Absolute: 0.1 10*3/uL (ref 0.0–0.5)
HCT: 29.1 % — ABNORMAL LOW (ref 34.8–46.6)
Hemoglobin: 9.7 g/dL — ABNORMAL LOW (ref 11.6–15.9)
LYMPHS ABS: 2.1 10*3/uL (ref 0.9–3.3)
LYMPHS PCT: 17 %
MCH: 28.9 pg (ref 25.1–34.0)
MCHC: 33.3 g/dL (ref 31.5–36.0)
MCV: 86.6 fL (ref 79.5–101.0)
MONO ABS: 1.4 10*3/uL — AB (ref 0.1–0.9)
Monocytes Relative: 11 %
Neutro Abs: 8.9 10*3/uL — ABNORMAL HIGH (ref 1.5–6.5)
Neutrophils Relative %: 71 %
Platelet Count: 163 10*3/uL (ref 145–400)
RBC: 3.36 MIL/uL — ABNORMAL LOW (ref 3.70–5.45)
RDW: 17.4 % — AB (ref 11.2–14.5)
WBC Count: 12.5 10*3/uL — ABNORMAL HIGH (ref 3.9–10.3)

## 2018-04-28 MED ORDER — IOPAMIDOL (ISOVUE-300) INJECTION 61%
INTRAVENOUS | Status: AC
  Administered 2018-04-28: 100 mL
  Filled 2018-04-28: qty 100

## 2018-04-28 MED ORDER — HEPARIN SOD (PORK) LOCK FLUSH 100 UNIT/ML IV SOLN
500.0000 [IU] | Freq: Once | INTRAVENOUS | Status: AC
  Administered 2018-04-28: 500 [IU] via INTRAVENOUS

## 2018-04-28 MED ORDER — HEPARIN SOD (PORK) LOCK FLUSH 100 UNIT/ML IV SOLN
INTRAVENOUS | Status: AC
  Administered 2018-04-28: 500 [IU] via INTRAVENOUS
  Filled 2018-04-28: qty 5

## 2018-04-28 MED ORDER — SODIUM CHLORIDE 0.9% FLUSH
10.0000 mL | Freq: Once | INTRAVENOUS | Status: AC
  Administered 2018-04-28: 10 mL
  Filled 2018-04-28: qty 10

## 2018-04-28 NOTE — Telephone Encounter (Signed)
Yes. She would receive cycle 6

## 2018-05-03 ENCOUNTER — Telehealth: Payer: Self-pay | Admitting: *Deleted

## 2018-05-03 NOTE — Telephone Encounter (Signed)
Prior authorization request received from [facility] on [address] for Lidocaine/Prilocaine cream 30GM.  Request to Managed Care Pick up tray for Prior [employer].

## 2018-05-05 ENCOUNTER — Inpatient Hospital Stay: Payer: Medicare Other | Attending: Internal Medicine

## 2018-05-05 ENCOUNTER — Inpatient Hospital Stay: Payer: Medicare Other

## 2018-05-05 DIAGNOSIS — Z5189 Encounter for other specified aftercare: Secondary | ICD-10-CM | POA: Insufficient documentation

## 2018-05-05 DIAGNOSIS — Z5112 Encounter for antineoplastic immunotherapy: Secondary | ICD-10-CM | POA: Diagnosis present

## 2018-05-05 DIAGNOSIS — Z5111 Encounter for antineoplastic chemotherapy: Secondary | ICD-10-CM | POA: Insufficient documentation

## 2018-05-05 DIAGNOSIS — C3491 Malignant neoplasm of unspecified part of right bronchus or lung: Secondary | ICD-10-CM | POA: Insufficient documentation

## 2018-05-05 DIAGNOSIS — C7989 Secondary malignant neoplasm of other specified sites: Secondary | ICD-10-CM | POA: Insufficient documentation

## 2018-05-05 DIAGNOSIS — Z95828 Presence of other vascular implants and grafts: Secondary | ICD-10-CM

## 2018-05-05 DIAGNOSIS — R5382 Chronic fatigue, unspecified: Secondary | ICD-10-CM

## 2018-05-05 LAB — CBC WITH DIFFERENTIAL (CANCER CENTER ONLY)
BASOS PCT: 0 %
Basophils Absolute: 0 10*3/uL (ref 0.0–0.1)
Eosinophils Absolute: 0 10*3/uL (ref 0.0–0.5)
Eosinophils Relative: 0 %
HEMATOCRIT: 29.7 % — AB (ref 34.8–46.6)
HEMOGLOBIN: 9.8 g/dL — AB (ref 11.6–15.9)
Lymphocytes Relative: 15 %
Lymphs Abs: 1.8 10*3/uL (ref 0.9–3.3)
MCH: 29.2 pg (ref 25.1–34.0)
MCHC: 33 g/dL (ref 31.5–36.0)
MCV: 88.4 fL (ref 79.5–101.0)
MONO ABS: 0.8 10*3/uL (ref 0.1–0.9)
MONOS PCT: 7 %
NEUTROS ABS: 9 10*3/uL — AB (ref 1.5–6.5)
Neutrophils Relative %: 78 %
Platelet Count: 156 10*3/uL (ref 145–400)
RBC: 3.36 MIL/uL — ABNORMAL LOW (ref 3.70–5.45)
RDW: 17.5 % — AB (ref 11.2–14.5)
WBC Count: 11.6 10*3/uL — ABNORMAL HIGH (ref 3.9–10.3)

## 2018-05-05 LAB — CMP (CANCER CENTER ONLY)
ALBUMIN: 3.3 g/dL — AB (ref 3.5–5.0)
ALK PHOS: 141 U/L (ref 40–150)
ALT: 8 U/L (ref 0–55)
ANION GAP: 8 (ref 3–11)
AST: 15 U/L (ref 5–34)
BILIRUBIN TOTAL: 0.3 mg/dL (ref 0.2–1.2)
BUN: 8 mg/dL (ref 7–26)
CALCIUM: 9.1 mg/dL (ref 8.4–10.4)
CO2: 24 mmol/L (ref 22–29)
Chloride: 103 mmol/L (ref 98–109)
Creatinine: 0.6 mg/dL (ref 0.60–1.10)
GFR, Est AFR Am: >60 mL/min (ref >60)
GFR, Estimated: >60 mL/min (ref >60)
Glucose, Bld: 129 mg/dL (ref 70–140)
POTASSIUM: 4.4 mmol/L (ref 3.5–5.1)
Sodium: 135 mmol/L — ABNORMAL LOW (ref 136–145)
TOTAL PROTEIN: 6.9 g/dL (ref 6.4–8.3)

## 2018-05-05 MED ORDER — HEPARIN SOD (PORK) LOCK FLUSH 100 UNIT/ML IV SOLN
250.0000 [IU] | Freq: Once | INTRAVENOUS | Status: AC
  Administered 2018-05-05: 250 [IU]
  Filled 2018-05-05: qty 5

## 2018-05-05 MED ORDER — SODIUM CHLORIDE 0.9% FLUSH
10.0000 mL | Freq: Once | INTRAVENOUS | Status: AC
  Administered 2018-05-05: 10 mL
  Filled 2018-05-05: qty 10

## 2018-05-06 ENCOUNTER — Encounter (HOSPITAL_BASED_OUTPATIENT_CLINIC_OR_DEPARTMENT_OTHER): Payer: Medicare Other | Attending: Internal Medicine

## 2018-05-06 DIAGNOSIS — C3490 Malignant neoplasm of unspecified part of unspecified bronchus or lung: Secondary | ICD-10-CM | POA: Diagnosis not present

## 2018-05-06 DIAGNOSIS — Z923 Personal history of irradiation: Secondary | ICD-10-CM | POA: Insufficient documentation

## 2018-05-06 DIAGNOSIS — C7989 Secondary malignant neoplasm of other specified sites: Secondary | ICD-10-CM | POA: Insufficient documentation

## 2018-05-06 DIAGNOSIS — T8131XA Disruption of external operation (surgical) wound, not elsewhere classified, initial encounter: Secondary | ICD-10-CM | POA: Diagnosis not present

## 2018-05-06 DIAGNOSIS — Y838 Other surgical procedures as the cause of abnormal reaction of the patient, or of later complication, without mention of misadventure at the time of the procedure: Secondary | ICD-10-CM | POA: Diagnosis not present

## 2018-05-06 DIAGNOSIS — I10 Essential (primary) hypertension: Secondary | ICD-10-CM | POA: Insufficient documentation

## 2018-05-06 DIAGNOSIS — L98498 Non-pressure chronic ulcer of skin of other sites with other specified severity: Secondary | ICD-10-CM | POA: Diagnosis not present

## 2018-05-06 LAB — TSH: TSH: 1.835 u[IU]/mL (ref 0.308–3.960)

## 2018-05-11 NOTE — Progress Notes (Signed)
FMLA successfully faxed to [facility] at [phone]. Mailed copy to patient address on file.

## 2018-05-12 ENCOUNTER — Telehealth: Payer: Self-pay | Admitting: Internal Medicine

## 2018-05-12 ENCOUNTER — Inpatient Hospital Stay: Payer: Medicare Other | Admitting: Nutrition

## 2018-05-12 ENCOUNTER — Inpatient Hospital Stay: Payer: Medicare Other

## 2018-05-12 ENCOUNTER — Inpatient Hospital Stay (HOSPITAL_BASED_OUTPATIENT_CLINIC_OR_DEPARTMENT_OTHER): Payer: Medicare Other | Admitting: Internal Medicine

## 2018-05-12 ENCOUNTER — Encounter: Payer: Self-pay | Admitting: Internal Medicine

## 2018-05-12 VITALS — BP 131/81 | HR 107 | Temp 98.1°F | Resp 17 | Ht 61.0 in | Wt 121.2 lb

## 2018-05-12 VITALS — HR 99

## 2018-05-12 DIAGNOSIS — R110 Nausea: Secondary | ICD-10-CM | POA: Diagnosis not present

## 2018-05-12 DIAGNOSIS — C3491 Malignant neoplasm of unspecified part of right bronchus or lung: Secondary | ICD-10-CM

## 2018-05-12 DIAGNOSIS — Z5111 Encounter for antineoplastic chemotherapy: Secondary | ICD-10-CM | POA: Diagnosis not present

## 2018-05-12 DIAGNOSIS — R5383 Other fatigue: Secondary | ICD-10-CM | POA: Diagnosis not present

## 2018-05-12 DIAGNOSIS — Z5112 Encounter for antineoplastic immunotherapy: Secondary | ICD-10-CM

## 2018-05-12 DIAGNOSIS — I10 Essential (primary) hypertension: Secondary | ICD-10-CM

## 2018-05-12 DIAGNOSIS — Z95828 Presence of other vascular implants and grafts: Secondary | ICD-10-CM

## 2018-05-12 DIAGNOSIS — C7989 Secondary malignant neoplasm of other specified sites: Secondary | ICD-10-CM

## 2018-05-12 LAB — CBC WITH DIFFERENTIAL (CANCER CENTER ONLY)
BASOS PCT: 1 %
Basophils Absolute: 0 10*3/uL (ref 0.0–0.1)
EOS ABS: 0 10*3/uL (ref 0.0–0.5)
Eosinophils Relative: 0 %
HCT: 37.3 % (ref 34.8–46.6)
Hemoglobin: 12.5 g/dL (ref 11.6–15.9)
Lymphocytes Relative: 16 %
Lymphs Abs: 1.5 10*3/uL (ref 0.9–3.3)
MCH: 29.7 pg (ref 25.1–34.0)
MCHC: 33.6 g/dL (ref 31.5–36.0)
MCV: 88.5 fL (ref 79.5–101.0)
Monocytes Absolute: 0.7 10*3/uL (ref 0.1–0.9)
Monocytes Relative: 8 %
NEUTROS PCT: 75 %
Neutro Abs: 7 10*3/uL — ABNORMAL HIGH (ref 1.5–6.5)
Platelet Count: 243 10*3/uL (ref 145–400)
RBC: 4.22 MIL/uL (ref 3.70–5.45)
RDW: 17.5 % — ABNORMAL HIGH (ref 11.2–14.5)
WBC: 9.3 10*3/uL (ref 3.9–10.3)

## 2018-05-12 LAB — CMP (CANCER CENTER ONLY)
ALK PHOS: 148 U/L (ref 40–150)
ALT: 9 U/L (ref 0–55)
ANION GAP: 9 (ref 3–11)
AST: 15 U/L (ref 5–34)
Albumin: 4 g/dL (ref 3.5–5.0)
BUN: 10 mg/dL (ref 7–26)
CALCIUM: 10.7 mg/dL — AB (ref 8.4–10.4)
CHLORIDE: 103 mmol/L (ref 98–109)
CO2: 27 mmol/L (ref 22–29)
CREATININE: 0.65 mg/dL (ref 0.60–1.10)
Glucose, Bld: 121 mg/dL (ref 70–140)
Potassium: 4.1 mmol/L (ref 3.5–5.1)
Sodium: 139 mmol/L (ref 136–145)
Total Bilirubin: 0.3 mg/dL (ref 0.2–1.2)
Total Protein: 8.6 g/dL — ABNORMAL HIGH (ref 6.4–8.3)

## 2018-05-12 MED ORDER — CARBOPLATIN CHEMO INJECTION 450 MG/45ML
383.0000 mg | Freq: Once | INTRAVENOUS | Status: AC
  Administered 2018-05-12: 380 mg via INTRAVENOUS
  Filled 2018-05-12: qty 38

## 2018-05-12 MED ORDER — PEMBROLIZUMAB CHEMO INJECTION 100 MG/4ML
200.0000 mg | Freq: Once | INTRAVENOUS | Status: AC
  Administered 2018-05-12: 200 mg via INTRAVENOUS
  Filled 2018-05-12: qty 8

## 2018-05-12 MED ORDER — DIPHENHYDRAMINE HCL 50 MG/ML IJ SOLN
INTRAMUSCULAR | Status: AC
  Filled 2018-05-12: qty 1

## 2018-05-12 MED ORDER — SODIUM CHLORIDE 0.9 % IV SOLN
Freq: Once | INTRAVENOUS | Status: AC
  Administered 2018-05-12: 12:00:00 via INTRAVENOUS

## 2018-05-12 MED ORDER — FAMOTIDINE IN NACL 20-0.9 MG/50ML-% IV SOLN
INTRAVENOUS | Status: AC
  Filled 2018-05-12: qty 50

## 2018-05-12 MED ORDER — SODIUM CHLORIDE 0.9% FLUSH
10.0000 mL | INTRAVENOUS | Status: DC | PRN
  Administered 2018-05-12: 10 mL
  Filled 2018-05-12: qty 10

## 2018-05-12 MED ORDER — PALONOSETRON HCL INJECTION 0.25 MG/5ML
INTRAVENOUS | Status: AC
  Filled 2018-05-12: qty 5

## 2018-05-12 MED ORDER — FAMOTIDINE IN NACL 20-0.9 MG/50ML-% IV SOLN
20.0000 mg | Freq: Once | INTRAVENOUS | Status: AC
  Administered 2018-05-12: 20 mg via INTRAVENOUS

## 2018-05-12 MED ORDER — DIPHENHYDRAMINE HCL 50 MG/ML IJ SOLN
50.0000 mg | Freq: Once | INTRAMUSCULAR | Status: AC
  Administered 2018-05-12: 50 mg via INTRAVENOUS

## 2018-05-12 MED ORDER — HEPARIN SOD (PORK) LOCK FLUSH 100 UNIT/ML IV SOLN
500.0000 [IU] | Freq: Once | INTRAVENOUS | Status: AC | PRN
Start: 2018-05-12 — End: 2018-05-12
  Administered 2018-05-12: 500 [IU]
  Filled 2018-05-12: qty 5

## 2018-05-12 MED ORDER — SODIUM CHLORIDE 0.9 % IV SOLN
175.0000 mg/m2 | Freq: Once | INTRAVENOUS | Status: AC
  Administered 2018-05-12: 276 mg via INTRAVENOUS
  Filled 2018-05-12: qty 46

## 2018-05-12 MED ORDER — PALONOSETRON HCL INJECTION 0.25 MG/5ML
0.2500 mg | Freq: Once | INTRAVENOUS | Status: AC
  Administered 2018-05-12: 0.25 mg via INTRAVENOUS

## 2018-05-12 MED ORDER — SODIUM CHLORIDE 0.9 % IV SOLN
Freq: Once | INTRAVENOUS | Status: AC
  Administered 2018-05-12: 12:00:00 via INTRAVENOUS
  Filled 2018-05-12: qty 5

## 2018-05-12 MED ORDER — SODIUM CHLORIDE 0.9% FLUSH
10.0000 mL | Freq: Once | INTRAVENOUS | Status: AC
  Administered 2018-05-12: 10 mL
  Filled 2018-05-12: qty 10

## 2018-05-12 NOTE — Telephone Encounter (Signed)
Appointments scheduled AVS/Calendar printed per 6/12 los °

## 2018-05-12 NOTE — Progress Notes (Signed)
Nutrition follow-up completed with patient during infusion for lung cancer. Weight decreased was documented as 121.2 pounds down from 124 pounds May 1 but improved from 118.2 pounds May 22. Patient reports occasional nausea.  Her nausea medication is effective. She reports constipation has improved with medication. She is trying to drink 3 Ensure a day.  Nutrition diagnosis: Severe malnutrition improved.  Intervention: Patient educated to continue high-calorie, high-protein foods with small meals and snacks. Encouraged 3 Ensure daily. Provided coupons. Teach back method used.  Monitoring, evaluation, goals: Patient will tolerate adequate calories and protein to minimize weight loss.  Next visit: Wednesday, August 14 during infusion.  **Disclaimer: This note was dictated with voice recognition software. Similar sounding words can inadvertently be transcribed and this note may contain transcription errors which may not have been corrected upon publication of note.**

## 2018-05-12 NOTE — Patient Instructions (Signed)
[facility] Discharge Instructions for Patients Receiving Chemotherapy  Today you received the following chemotherapy agents: Keytruda, Taxol, Carbo  To help prevent nausea and vomiting after your treatment, we encourage you to take your nausea medication as directed.   If you develop nausea and vomiting that is not controlled by your nausea medication, call the clinic.   BELOW ARE SYMPTOMS THAT SHOULD BE REPORTED IMMEDIATELY:  *FEVER GREATER THAN 100.5 F  *CHILLS WITH OR WITHOUT FEVER  NAUSEA AND VOMITING THAT IS NOT CONTROLLED WITH YOUR NAUSEA MEDICATION  *UNUSUAL SHORTNESS OF BREATH  *UNUSUAL BRUISING OR BLEEDING  TENDERNESS IN MOUTH AND THROAT WITH OR WITHOUT PRESENCE OF ULCERS  *URINARY PROBLEMS  *BOWEL PROBLEMS  UNUSUAL RASH Items with * indicate a potential emergency and should be followed up as soon as possible.  Feel free to call the clinic should you have any questions or concerns. The clinic phone number is [phone].  Please show the [facility] at check-in to the Emergency Department and triage nurse.

## 2018-05-12 NOTE — Progress Notes (Signed)
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  She presented with large central necrotic right lower neck mass in addition to bulky mediastinal and bilateral hilar adenopathy as well as a small hypermetabolic lung lesion diagnosed in February 2019.  PRIOR THERAPY: Palliative radiotherapy to the large necrotic mass of the right neck under the care of Dr. Isidore Moos, completed on February 19, 2018.  CURRENT THERAPY: Palliative systemic chemotherapy with carboplatin for AUC of 5, paclitaxel 175 mg/M2 and Keytruda 200 mg IV every 3 weeks.  First dose 03/03/2018.  Status post 3 cycles.  INTERVAL HISTORY: Joey A [place] 67 y.o. female returns to the clinic today for follow-up visit accompanied by her 2 daughters.  The patient is feeling fine today with no specific complaints except for fatigue and intermittent nausea.  She does not take her nausea medicine as prescribed.  She denied having any current chest pain but has shortness of breath with exertion with no cough or hemoptysis.  She denied having any recent weight loss or night sweats.  She has no fever or chills.  She denied having any headache or visual changes.  The wound on the right neck area is improving and she is followed by the wound clinic.  She had a repeat CT scan of the neck, chest, abdomen and pelvis performed recently and she is here for evaluation and discussion of her risk her results.  MEDICAL HISTORY: Past Medical History:  Diagnosis Date  . Anxiety   . Arthritis    "thighs; legs; hips" (07/05/2014)  . Bipolar disorder ([place])   . Cholelithiasis 07/2014  . Chronic bronchitis ([place])    "get it q yr"  . Chronic lower back pain   . Depression    pt. use to go to depression clinic, states she still has depression &  anxiety but can't get to appt. so she hasn't had any med. for it in a while   . Dyspnea   . GERD (gastroesophageal reflux disease)   . High cholesterol   . History of radiation therapy 02/01/18- 02/12/18   Right Neck/ 30 Gy in 10 fractions.   . Hypertension   . Mass in neck 12/2017   RIGHT SIDE OF NECK  . Peripheral vascular disease ([place])   . Schizophrenia ([place])   . Stroke [facility])     ALLERGIES:  is allergic to penicillins; codeine; and percocet [oxycodone-acetaminophen].  MEDICATIONS:  Current Outpatient Medications  Medication Sig Dispense Refill  . acetaminophen (TYLENOL) 325 MG tablet Take 2 tablets (650 mg total) by mouth every 6 (six) hours as needed for mild pain (or Fever >/= 101).    Marland Kitchen albuterol (PROVENTIL HFA;VENTOLIN HFA) 108 (90 Base) MCG/ACT inhaler Inhale 1-2 puffs into the lungs every 6 (six) hours as needed for wheezing or shortness of breath.    Marland Kitchen albuterol (PROVENTIL) (2.5 MG/3ML) 0.083% nebulizer solution Take 2.5 mg by nebulization every 6 (six) hours as needed for wheezing or shortness of breath.    . clopidogrel (PLAVIX) 75 MG tablet Take 1 tablet (75 mg total) by mouth daily. 30 tablet 0  . diclofenac sodium (VOLTAREN) 1 % GEL Apply 2 g topically daily as needed (pain).     Marland Kitchen diphenhydrAMINE (BENADRYL) 25 MG tablet Take 2 tablets (50 mg total) by mouth at  bedtime as needed for sleep. 20 tablet 0  . fluticasone (FLONASE) 50 MCG/ACT nasal spray Place 2 sprays into both nostrils daily.   0  . furosemide (LASIX) 20 MG tablet Take 20 mg by mouth daily as needed for fluid or edema.     . gabapentin (NEURONTIN) 300 MG capsule Take 300 mg by mouth 3 (three) times daily.     Marland Kitchen lidocaine (XYLOCAINE) 2 % solution Caregiver: Mix 1part 2% viscous lidocaine, 1part H20. Swish & swallow 69mL of diluted mixture, 32min before meals and at bedtime, up to QID 100 mL 5  . lidocaine-prilocaine (EMLA) cream Apply 1 application topically as needed. 30 g 1  . loratadine (CLARITIN) 10 MG tablet  Take 10 mg by mouth daily.    . mometasone (ELOCON) 0.1 % ointment Apply 1 application topically daily as needed (irritation).     Marland Kitchen omeprazole (PRILOSEC) 20 MG capsule Take 20 mg by mouth 2 (two) times daily before a meal.    . Oxycodone HCl 20 MG TABS Take 1 tablet by mouth 4 (four) times daily as needed.  0  . potassium chloride 20 MEQ/15ML (10%) SOLN Take 15 mLs by mouth daily.    . Vitamin D, Ergocalciferol, (DRISDOL) 50000 units CAPS capsule Take 50,000 Units by mouth every 7 (seven) days.    . AMITIZA 24 MCG capsule Take 24 mcg by mouth 2 (two) times daily.  0  . Linaclotide (LINZESS) 290 MCG CAPS capsule Take 290-580 mcg by mouth daily as needed (constipation).     . methocarbamol (ROBAXIN) 750 MG tablet Take 750 mg by mouth 2 (two) times daily as needed for muscle spasms.     Marland Kitchen oxyCODONE-acetaminophen (PERCOCET) 10-325 MG tablet Take 1 tablet by mouth every 6 (six) hours as needed for pain. (Patient not taking: Reported on 05/12/2018) 30 tablet 0  . polyethylene glycol (MIRALAX / GLYCOLAX) packet Take 17 g by mouth daily. (Patient not taking: Reported on 05/12/2018) 14 each 0  . prochlorperazine (COMPAZINE) 10 MG tablet Take 1 tablet (10 mg total) by mouth every 6 (six) hours as needed for nausea or vomiting. (Patient not taking: Reported on 04/02/2018) 30 tablet 0  . SANTYL ointment APPLY  AA QD  1   No current facility-administered medications for this visit.     SURGICAL HISTORY:  Past Surgical History:  Procedure Laterality Date  . ABDOMINAL AORTOGRAM N/A 01/07/2017   Procedure: Abdominal Aortogram;  Surgeon: Waynetta Sandy, MD;  Location: [facility];  Service: Cardiovascular;  Laterality: N/A;  . ABDOMINAL HYSTERECTOMY    . ANKLE FRACTURE SURGERY Right   . ANKLE HARDWARE REMOVAL Right   . APPENDECTOMY  ~ 1963  . BREAST BIOPSY Bilateral   . BREAST CYST EXCISION Bilateral    "not cancer"  . CATARACT EXTRACTION W/ INTRAOCULAR LENS  IMPLANT, BILATERAL    .  CHOLECYSTECTOMY N/A 07/05/2014   Procedure: LAPAROSCOPIC CHOLECYSTECTOMY WITH INTRAOPERATIVE CHOLANGIOGRAM;  Surgeon: Gayland Curry, MD;  Location: [facility];  Service: General;  Laterality: N/A;  . EXCISIONAL HEMORRHOIDECTOMY    . FEMORAL-POPLITEAL BYPASS GRAFT Right 01/15/2017   Procedure: BYPASS GRAFT FEMORAL-POPLITEAL ARTERY RIGHT LEG;  Surgeon: Waynetta Sandy, MD;  Location: [facility];  Service: Vascular;  Laterality: Right;  . IR IMAGING GUIDED PORT INSERTION  03/08/2018  . IR US GUIDE VASC ACCESS LEFT  03/08/2018  . LAPAROSCOPIC CHOLECYSTECTOMY  07/05/2014  . LOWER EXTREMITY ANGIOGRAPHY Bilateral 01/07/2017   Procedure: Lower Extremity Angiography;  Surgeon: Erlene Quan  Dione Plover, MD;  Location: [facility];  Service: Cardiovascular;  Laterality: Bilateral;  . MASS BIOPSY Right 12/25/2017   Procedure: INCISIONAL RIGHT NECK MASS BIOPSY;  Surgeon: Helayne Seminole, MD;  Location: [facility];  Service: ENT;  Laterality: Right;  . MULTIPLE TOOTH EXTRACTIONS  05/2013   "pulled my upper teeth"  . PILONIDAL CYST EXCISION      REVIEW OF SYSTEMS:  Constitutional: positive for fatigue Eyes: negative Ears, nose, mouth, throat, and face: negative Respiratory: positive for dyspnea on exertion Cardiovascular: negative Gastrointestinal: negative Genitourinary:negative Integument/breast: negative Hematologic/lymphatic: negative Musculoskeletal:positive for muscle weakness Neurological: negative Behavioral/Psych: negative Endocrine: negative Allergic/Immunologic: negative   PHYSICAL EXAMINATION: General appearance: alert, cooperative, fatigued and no distress Head: Normocephalic, without obvious abnormality, atraumatic Neck: no carotid bruit, no JVD, supple, symmetrical, trachea midline, thyroid not enlarged, symmetric, no tenderness/mass/nodules and Open wound in the right neck area secondary to previous radiation Lymph nodes: Cervical adenopathy: Enlargement right supraclavicular  lymphadenopathy Resp: clear to auscultation bilaterally Back: symmetric, no curvature. ROM normal. No CVA tenderness. Cardio: regular rate and rhythm, S1, S2 normal, no murmur, click, rub or gallop GI: soft, non-tender; bowel sounds normal; no masses,  no organomegaly Extremities: extremities normal, atraumatic, no cyanosis or edema Neurologic: Alert and oriented X 3, normal strength and tone. Normal symmetric reflexes. Normal coordination and gait  ECOG PERFORMANCE STATUS: 1 - Symptomatic but completely ambulatory  Blood pressure 131/81, pulse (!) 107, temperature 98.1 F (36.7 C), temperature source Oral, resp. rate 17, height 5\' 1"  (1.549 m), weight 121 lb 3.2 oz (55 kg), SpO2 98 %.  LABORATORY DATA: Lab Results  Component Value Date   WBC 9.3 05/12/2018   HGB 12.5 05/12/2018   HCT 37.3 05/12/2018   MCV 88.5 05/12/2018   PLT 243 05/12/2018      Chemistry      Component Value Date/Time   NA 135 (L) 05/05/2018 1607   K 4.4 05/05/2018 1607   CL 103 05/05/2018 1607   CO2 24 05/05/2018 1607   BUN 8 05/05/2018 1607   CREATININE 0.60 05/05/2018 1607      Component Value Date/Time   CALCIUM 9.1 05/05/2018 1607   ALKPHOS 141 05/05/2018 1607   AST 15 05/05/2018 1607   ALT 8 05/05/2018 1607   BILITOT 0.3 05/05/2018 1607       RADIOGRAPHIC STUDIES: Ct Soft Tissue Neck W Contrast  Result Date: 04/28/2018 CLINICAL DATA:  67 year old female with 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 Lung primary versus head and neck primary. Palliative radiotherapy to a dominant, necrotic right neck mass completed in March. Chemotherapy. Restaging. EXAM: CT NECK WITH CONTRAST TECHNIQUE: Multidetector CT imaging of the neck was performed using the standard protocol following the bolus administration of intravenous contrast. CONTRAST:  165mL ISOVUE-300 IOPAMIDOL (ISOVUE-300) INJECTION 61% in conjunction with contrast enhanced imaging of the chest, abdomen,  and pelvis reported separately. COMPARISON:  PET-CT 01/18/2018.  CTA head and neck 02/14/2017. FINDINGS: Pharynx and larynx: Laryngeal and pharyngeal soft tissue contours remain within normal limits. Negative parapharyngeal and retropharyngeal spaces. Salivary glands: Negative sublingual space. Submandibular and parotid glands are within normal limits. Thyroid: Negative. Lymph nodes: The large centrally necrotic right level 2 through level 4 lymph node mass has been partially resected and/or externalized since 01/18/2018. See series 2, image 59. Residual thick walled and enhancing soft tissue shell along all internal margins of the mass persists (series 2, image 58) and encompasses up to about 47 millimeters diameter  as seen on coronal image 51. Right level 4 and thoracic inlet metastatic nodes have virtually resolved since February. There are persistent normal appearing right level 2 a and 2 B nodes (series 2, image 37). Right level 1 nodes remain normal. No left side lymphadenopathy identified. Vascular: Left IJ approach porta cath. 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 The other major vascular structures in the neck and at the skull base remain patent, including the right IJ which is inseparable from the residual dominant right nodal mass on series 2, image 58. Limited intracranial: Calcified atherosclerosis at the skull base. Negative visualized brain parenchyma. Visualized orbits: Stable and negative. Mastoids and visualized paranasal sinuses: Clear. Skeleton: Absent maxillary dentition. No acute or suspicious osseous lesion identified in the neck. Upper chest: Reported separately today. IMPRESSION: 1. The large centrally necrotic right level II through level IV lymph node mass has become externalized since February. A thick-walled enhancing shell of the lesion persists and measures about 47 mm diameter as seen on coronal image 51. 2. Regression of the other right level IV  and right thoracic inlet lymphadenopathy since February. 3. No new or increased tumor or metastatic disease identified in the neck. 4. Chronic left ICA occlusion. Electronically Signed   By: Genevie Ann M.D.   On: 04/28/2018 17:02   Ct Chest W Contrast  Result Date: 04/29/2018 CLINICAL DATA:  Restaging metastatic lung cancer. EXAM: CT CHEST, ABDOMEN, AND PELVIS WITH CONTRAST TECHNIQUE: Multidetector CT imaging of the chest, abdomen and pelvis was performed following the standard protocol during bolus administration of intravenous contrast. CONTRAST:  152mL ISOVUE-300 IOPAMIDOL (ISOVUE-300) INJECTION 61% COMPARISON:  PET-CT 01/18/2018 FINDINGS: CT CHEST FINDINGS Cardiovascular: The heart is normal in size. No pericardial effusion. Stable mild tortuosity and calcification of the thoracic aorta and branch vessels. 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 Stable three-vessel coronary artery calcifications. Stable mild pulmonary artery enlargement suggesting pulmonary hypertension. Mediastinum/Nodes: The right paratracheal lymph node on image number 21 measures 15.5 mm and previously measured 24 mm. Persistent subcarinal adenopathy on the right side. Nodal mass measures 3.5 x 2.5 cm on image number 32. It previously measured 3.5 x 2.0 cm on the PET-CT. Lungs/Pleura: 5 mm left upper lobe cavitary lesion previously measured 6 mm. The wall of the cavity is much thinner. 15.5 mm vague ground-glass opacity in the left upper lobe on image number 20 is most likely inflammatory and was not present on the prior study. There is a similar area more anteriorly on image 20 which appears stable. This measures 9 mm. No other pulmonary lesions are identified. Stable underlying emphysematous changes. Musculoskeletal: No breast masses or axillary adenopathy. No significant bony findings. No lytic or sclerotic bone lesions to suggest metastatic disease. CT ABDOMEN PELVIS FINDINGS Hepatobiliary: Small low-attenuation liver  lesions consistent with benign cysts. No change. No worrisome hepatic lesions or intrahepatic biliary dilatation. The gallbladder surgically absent. Mild associated common bile duct dilatation. Pancreas: No mass, inflammation or ductal dilatation. Spleen: Normal size.  No focal lesions. Adrenals/Urinary Tract: No adrenal gland lesions. Small bilateral renal calculi but no renal mass or hydronephrosis. The bladder is unremarkable. Stomach/Bowel: The stomach, duodenum, small bowel and colon are unremarkable. Vascular/Lymphatic: Advanced atherosclerotic calcifications involving the aorta and branch vessels but no aneurysm or dissection. Surgical changes with a occluded appearing femoral artery graft on the right. No mesenteric or retroperitoneal mass or adenopathy. Reproductive: Surgically absent. Other: No pelvic mass or adenopathy. No free pelvic fluid collections.  No inguinal mass or adenopathy. Musculoskeletal: No significant bony findings. No findings suspicious for metastatic disease. IMPRESSION: 1. Slight interval decrease in size of the left upper lobe cavitary lesion with a much thinner wall. 2. Slight decrease in size of the right paratracheal adenopathy and overall stable subcarinal adenopathy. 3. Ground-glass opacity/nodules in the left upper as detailed above. Recommend continued surveillance. 4. No findings for abdominal/pelvic metastatic disease or osseous metastatic disease. 5. Occluded right femoral artery graft. Electronically Signed   By: Marijo Sanes M.D.   On: 04/29/2018 10:55   Ct Abdomen Pelvis W Contrast  Result Date: 04/29/2018 CLINICAL DATA:  Restaging metastatic lung cancer. EXAM: CT CHEST, ABDOMEN, AND PELVIS WITH CONTRAST TECHNIQUE: Multidetector CT imaging of the chest, abdomen and pelvis was performed following the standard protocol during bolus administration of intravenous contrast. CONTRAST:  121mL ISOVUE-300 IOPAMIDOL (ISOVUE-300) INJECTION 61% COMPARISON:  PET-CT 01/18/2018  FINDINGS: CT CHEST FINDINGS Cardiovascular: The heart is normal in size. No pericardial effusion. Stable mild tortuosity and calcification of the thoracic aorta and branch vessels. 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 Stable three-vessel coronary artery calcifications. Stable mild pulmonary artery enlargement suggesting pulmonary hypertension. Mediastinum/Nodes: The right paratracheal lymph node on image number 21 measures 15.5 mm and previously measured 24 mm. Persistent subcarinal adenopathy on the right side. Nodal mass measures 3.5 x 2.5 cm on image number 32. It previously measured 3.5 x 2.0 cm on the PET-CT. Lungs/Pleura: 5 mm left upper lobe cavitary lesion previously measured 6 mm. The wall of the cavity is much thinner. 15.5 mm vague ground-glass opacity in the left upper lobe on image number 20 is most likely inflammatory and was not present on the prior study. There is a similar area more anteriorly on image 20 which appears stable. This measures 9 mm. No other pulmonary lesions are identified. Stable underlying emphysematous changes. Musculoskeletal: No breast masses or axillary adenopathy. No significant bony findings. No lytic or sclerotic bone lesions to suggest metastatic disease. CT ABDOMEN PELVIS FINDINGS Hepatobiliary: Small low-attenuation liver lesions consistent with benign cysts. No change. No worrisome hepatic lesions or intrahepatic biliary dilatation. The gallbladder surgically absent. Mild associated common bile duct dilatation. Pancreas: No mass, inflammation or ductal dilatation. Spleen: Normal size.  No focal lesions. Adrenals/Urinary Tract: No adrenal gland lesions. Small bilateral renal calculi but no renal mass or hydronephrosis. The bladder is unremarkable. Stomach/Bowel: The stomach, duodenum, small bowel and colon are unremarkable. Vascular/Lymphatic: Advanced atherosclerotic calcifications involving the aorta and branch vessels but no aneurysm or dissection.  Surgical changes with a occluded appearing femoral artery graft on the right. No mesenteric or retroperitoneal mass or adenopathy. Reproductive: Surgically absent. Other: No pelvic mass or adenopathy. No free pelvic fluid collections. No inguinal mass or adenopathy. Musculoskeletal: No significant bony findings. No findings suspicious for metastatic disease. IMPRESSION: 1. Slight interval decrease in size of the left upper lobe cavitary lesion with a much thinner wall. 2. Slight decrease in size of the right paratracheal adenopathy and overall stable subcarinal adenopathy. 3. Ground-glass opacity/nodules in the left upper as detailed above. Recommend continued surveillance. 4. No findings for abdominal/pelvic metastatic disease or osseous metastatic disease. 5. Occluded right femoral artery graft. Electronically Signed   By: Marijo Sanes M.D.   On: 04/29/2018 10:55    ASSESSMENT AND PLAN: This is a very pleasant 67 years old African-American female with metastatic poorly differentiated carcinoma highly suspicious for primary lung cancer versus head and neck cancer.  She is status post palliative  radiotherapy to the large right neck mass. The patient is currently undergoing systemic chemotherapy with carboplatin, paclitaxel and Keytruda status post 3 cycles.   The patient continues to tolerate this treatment well with no concerning complaints. She had a repeat CT scan of the neck, chest, abdomen and pelvis performed recently.  I personally and independently reviewed the scans and discussed the results with the patient and her daughters today.  Her scan showed mild improvement of her disease. I recommended for the patient to continue her current treatment with carboplatin, paclitaxel and Keytruda today. Starting from cycle #5 she will be treated with single agent Keytruda. The patient will come back for follow-up visit in 3 weeks for evaluation before the next cycle of her treatment. For pain management she  will continue with the current pain medication with OxyContin and Percocet. She was advised to call immediately if she has any concerning symptoms in the interval. The patient voices understanding of current disease status and treatment options and is in agreement with the current care plan.  All questions were answered. The patient knows to call the clinic with any problems, questions or concerns. We can certainly see the patient much sooner if necessary.  Disclaimer: This note was dictated with voice recognition software. Similar sounding words can inadvertently be transcribed and may not be corrected upon review.

## 2018-05-14 ENCOUNTER — Inpatient Hospital Stay: Payer: Medicare Other

## 2018-05-14 ENCOUNTER — Ambulatory Visit: Payer: Medicare Other

## 2018-05-14 DIAGNOSIS — C3491 Malignant neoplasm of unspecified part of right bronchus or lung: Secondary | ICD-10-CM

## 2018-05-14 DIAGNOSIS — Z5111 Encounter for antineoplastic chemotherapy: Secondary | ICD-10-CM | POA: Diagnosis not present

## 2018-05-14 MED ORDER — PEGFILGRASTIM-CBQV 6 MG/0.6ML ~~LOC~~ SOSY
6.0000 mg | PREFILLED_SYRINGE | Freq: Once | SUBCUTANEOUS | Status: AC
  Administered 2018-05-14: 6 mg via SUBCUTANEOUS

## 2018-05-14 MED ORDER — PEGFILGRASTIM-CBQV 6 MG/0.6ML ~~LOC~~ SOSY
PREFILLED_SYRINGE | SUBCUTANEOUS | Status: AC
  Filled 2018-05-14: qty 0.6

## 2018-05-17 NOTE — Progress Notes (Signed)
FMLA for daughter, Faith [name], successfully faxed to [employer] at [phone]. Mailed copy to patient address on file.

## 2018-05-19 ENCOUNTER — Other Ambulatory Visit: Payer: Medicare Other

## 2018-05-20 ENCOUNTER — Inpatient Hospital Stay: Payer: Medicare Other

## 2018-05-20 DIAGNOSIS — C3491 Malignant neoplasm of unspecified part of right bronchus or lung: Secondary | ICD-10-CM

## 2018-05-20 DIAGNOSIS — Z5111 Encounter for antineoplastic chemotherapy: Secondary | ICD-10-CM | POA: Diagnosis not present

## 2018-05-20 DIAGNOSIS — Z95828 Presence of other vascular implants and grafts: Secondary | ICD-10-CM

## 2018-05-20 LAB — CBC WITH DIFFERENTIAL (CANCER CENTER ONLY)
Basophils Absolute: 0.1 10*3/uL (ref 0.0–0.1)
Basophils Relative: 0 %
EOS PCT: 0 %
Eosinophils Absolute: 0.1 10*3/uL (ref 0.0–0.5)
HEMATOCRIT: 33 % — AB (ref 34.8–46.6)
HEMOGLOBIN: 11.1 g/dL — AB (ref 11.6–15.9)
LYMPHS ABS: 2.9 10*3/uL (ref 0.9–3.3)
LYMPHS PCT: 16 %
MCH: 29.7 pg (ref 25.1–34.0)
MCHC: 33.6 g/dL (ref 31.5–36.0)
MCV: 88.2 fL (ref 79.5–101.0)
Monocytes Absolute: 2.7 10*3/uL — ABNORMAL HIGH (ref 0.1–0.9)
Monocytes Relative: 15 %
NEUTROS ABS: 12.1 10*3/uL — AB (ref 1.5–6.5)
Neutrophils Relative %: 69 %
Platelet Count: 177 10*3/uL (ref 145–400)
RBC: 3.74 MIL/uL (ref 3.70–5.45)
RDW: 16.7 % — ABNORMAL HIGH (ref 11.2–14.5)
WBC Count: 17.8 10*3/uL — ABNORMAL HIGH (ref 3.9–10.3)

## 2018-05-20 LAB — CMP (CANCER CENTER ONLY)
ALK PHOS: 175 U/L — AB (ref 40–150)
ALT: 16 U/L (ref 0–55)
AST: 21 U/L (ref 5–34)
Albumin: 4.1 g/dL (ref 3.5–5.0)
Anion gap: 11 (ref 3–11)
BILIRUBIN TOTAL: 0.2 mg/dL (ref 0.2–1.2)
BUN: 24 mg/dL (ref 7–26)
CALCIUM: 10.7 mg/dL — AB (ref 8.4–10.4)
CO2: 24 mmol/L (ref 22–29)
CREATININE: 0.99 mg/dL (ref 0.60–1.10)
Chloride: 106 mmol/L (ref 98–109)
GFR, EST NON AFRICAN AMERICAN: 59 mL/min — AB (ref >60)
Glucose, Bld: 129 mg/dL (ref 70–140)
Potassium: 2.9 mmol/L — CL (ref 3.5–5.1)
Sodium: 141 mmol/L (ref 136–145)
Total Protein: 8 g/dL (ref 6.4–8.3)

## 2018-05-20 MED ORDER — SODIUM CHLORIDE 0.9% FLUSH
10.0000 mL | Freq: Once | INTRAVENOUS | Status: AC
  Administered 2018-05-20: 10 mL
  Filled 2018-05-20: qty 10

## 2018-05-20 MED ORDER — HEPARIN SOD (PORK) LOCK FLUSH 100 UNIT/ML IV SOLN
500.0000 [IU] | Freq: Once | INTRAVENOUS | Status: AC
  Administered 2018-05-20: 500 [IU]
  Filled 2018-05-20: qty 5

## 2018-05-24 ENCOUNTER — Telehealth: Payer: Self-pay | Admitting: *Deleted

## 2018-05-24 DIAGNOSIS — T8131XA Disruption of external operation (surgical) wound, not elsewhere classified, initial encounter: Secondary | ICD-10-CM | POA: Diagnosis not present

## 2018-05-24 MED ORDER — POTASSIUM CHLORIDE CRYS ER 20 MEQ PO TBCR: 40.0000 meq | EXTENDED_RELEASE_TABLET | Freq: Once | ORAL | 0 refills | Status: DC

## 2018-05-24 NOTE — Telephone Encounter (Signed)
Notified pt to pick up Rx at pharmacy.

## 2018-05-24 NOTE — Telephone Encounter (Signed)
-----   Message from Curt Bears, MD sent at 05/20/2018  3:41 PM EDT ----- K Cl 40 meq po qd x 10 days. ----- Message ----- From: Interface, Lab In [place] Sent: 05/20/2018   2:13 PM To: Curt Bears, MD

## 2018-05-26 ENCOUNTER — Inpatient Hospital Stay: Payer: Medicare Other

## 2018-05-26 VITALS — BP 135/72 | HR 95 | Temp 98.4°F | Resp 22

## 2018-05-26 DIAGNOSIS — C3491 Malignant neoplasm of unspecified part of right bronchus or lung: Secondary | ICD-10-CM

## 2018-05-26 DIAGNOSIS — Z5111 Encounter for antineoplastic chemotherapy: Secondary | ICD-10-CM | POA: Diagnosis not present

## 2018-05-26 DIAGNOSIS — Z95828 Presence of other vascular implants and grafts: Secondary | ICD-10-CM

## 2018-05-26 DIAGNOSIS — R5382 Chronic fatigue, unspecified: Secondary | ICD-10-CM

## 2018-05-26 LAB — CMP (CANCER CENTER ONLY)
ALBUMIN: 3.4 g/dL — AB (ref 3.5–5.0)
ALT: 11 U/L (ref 0–44)
ANION GAP: 8 (ref 5–15)
AST: 12 U/L — ABNORMAL LOW (ref 15–41)
Alkaline Phosphatase: 131 U/L — ABNORMAL HIGH (ref 38–126)
BILIRUBIN TOTAL: 0.3 mg/dL (ref 0.3–1.2)
BUN: 13 mg/dL (ref 8–23)
CO2: 24 mmol/L (ref 22–32)
Calcium: 9.8 mg/dL (ref 8.9–10.3)
Chloride: 108 mmol/L (ref 98–111)
Creatinine: 0.61 mg/dL (ref 0.44–1.00)
GFR, Est AFR Am: >60 mL/min (ref >60)
GFR, Estimated: >60 mL/min (ref >60)
GLUCOSE: 134 mg/dL — AB (ref 70–99)
Potassium: 3.5 mmol/L (ref 3.5–5.1)
SODIUM: 140 mmol/L (ref 135–145)
TOTAL PROTEIN: 6.9 g/dL (ref 6.5–8.1)

## 2018-05-26 LAB — CBC WITH DIFFERENTIAL (CANCER CENTER ONLY)
BASOS ABS: 0 10*3/uL (ref 0.0–0.1)
Basophils Relative: 0 %
Eosinophils Absolute: 0 10*3/uL (ref 0.0–0.5)
Eosinophils Relative: 0 %
HCT: 29.8 % — ABNORMAL LOW (ref 34.8–46.6)
Hemoglobin: 9.9 g/dL — ABNORMAL LOW (ref 11.6–15.9)
LYMPHS ABS: 1.8 10*3/uL (ref 0.9–3.3)
Lymphocytes Relative: 13 %
MCH: 29.7 pg (ref 25.1–34.0)
MCHC: 33.4 g/dL (ref 31.5–36.0)
MCV: 89 fL (ref 79.5–101.0)
MONO ABS: 0.9 10*3/uL (ref 0.1–0.9)
Monocytes Relative: 7 %
NEUTROS ABS: 10.7 10*3/uL — AB (ref 1.5–6.5)
Neutrophils Relative %: 80 %
Platelet Count: 166 10*3/uL (ref 145–400)
RBC: 3.34 MIL/uL — ABNORMAL LOW (ref 3.70–5.45)
RDW: 18.6 % — AB (ref 11.2–14.5)
WBC Count: 13.5 10*3/uL — ABNORMAL HIGH (ref 3.9–10.3)

## 2018-05-26 MED ORDER — SODIUM CHLORIDE 0.9% FLUSH
10.0000 mL | Freq: Once | INTRAVENOUS | Status: AC
  Administered 2018-05-26: 10 mL
  Filled 2018-05-26: qty 10

## 2018-05-26 MED ORDER — HEPARIN SOD (PORK) LOCK FLUSH 100 UNIT/ML IV SOLN
500.0000 [IU] | Freq: Once | INTRAVENOUS | Status: AC
  Administered 2018-05-26: 500 [IU]
  Filled 2018-05-26: qty 5

## 2018-05-26 NOTE — Patient Instructions (Signed)
Implanted Port Home Guide An implanted port is a type of central line that is placed under the skin. Central lines are used to provide IV access when treatment or nutrition needs to be given through a person's veins. Implanted ports are used for long-term IV access. An implanted port may be placed because:  You need IV medicine that would be irritating to the small veins in your hands or arms.  You need long-term IV medicines, such as antibiotics.  You need IV nutrition for a long period.  You need frequent blood draws for lab tests.  You need dialysis.  Implanted ports are usually placed in the chest area, but they can also be placed in the upper arm, the abdomen, or the leg. An implanted port has two main parts:  Reservoir. The reservoir is round and will appear as a small, raised area under your skin. The reservoir is the part where a needle is inserted to give medicines or draw blood.  Catheter. The catheter is a thin, flexible tube that extends from the reservoir. The catheter is placed into a large vein. Medicine that is inserted into the reservoir goes into the catheter and then into the vein.  How will I care for my incision site? Do not get the incision site wet. Bathe or shower as directed by your health care provider. How is my port accessed? Special steps must be taken to access the port:  Before the port is accessed, a numbing cream can be placed on the skin. This helps numb the skin over the port site.  Your health care provider uses a sterile technique to access the port. ? Your health care provider must put on a mask and sterile gloves. ? The skin over your port is cleaned carefully with an antiseptic and allowed to dry. ? The port is gently pinched between sterile gloves, and a needle is inserted into the port.  Only "non-coring" port needles should be used to access the port. Once the port is accessed, a blood return should be checked. This helps ensure that the port  is in the vein and is not clogged.  If your port needs to remain accessed for a constant infusion, a clear (transparent) bandage will be placed over the needle site. The bandage and needle will need to be changed every week, or as directed by your health care provider.  Keep the bandage covering the needle clean and dry. Do not get it wet. Follow your health care provider's instructions on how to take a shower or bath while the port is accessed.  If your port does not need to stay accessed, no bandage is needed over the port.  What is flushing? Flushing helps keep the port from getting clogged. Follow your health care provider's instructions on how and when to flush the port. Ports are usually flushed with saline solution or a medicine called heparin. The need for flushing will depend on how the port is used.  If the port is used for intermittent medicines or blood draws, the port will need to be flushed: ? After medicines have been given. ? After blood has been drawn. ? As part of routine maintenance.  If a constant infusion is running, the port may not need to be flushed.  How long will my port stay implanted? The port can stay in for as long as your health care provider thinks it is needed. When it is time for the port to come out, surgery will be   done to remove it. The procedure is similar to the one performed when the port was put in. When should I seek immediate medical care? When you have an implanted port, you should seek immediate medical care if:  You notice a bad smell coming from the incision site.  You have swelling, redness, or drainage at the incision site.  You have more swelling or pain at the port site or the surrounding area.  You have a fever that is not controlled with medicine.  This information is not intended to replace advice given to you by your health care provider. Make sure you discuss any questions you have with your health care provider. Document  Released: 11/17/2005 Document Revised: 04/24/2016 Document Reviewed: 07/25/2013 Elsevier Interactive Patient Education  2017 Elsevier Inc.  

## 2018-05-27 LAB — TSH: TSH: 1.537 u[IU]/mL (ref 0.308–3.960)

## 2018-06-02 ENCOUNTER — Inpatient Hospital Stay: Payer: Medicare Other

## 2018-06-02 ENCOUNTER — Inpatient Hospital Stay: Payer: Medicare Other | Attending: Internal Medicine

## 2018-06-02 ENCOUNTER — Ambulatory Visit: Payer: Medicare Other | Admitting: Oncology

## 2018-06-02 VITALS — BP 132/65 | HR 70 | Temp 98.3°F | Resp 16

## 2018-06-02 DIAGNOSIS — C3491 Malignant neoplasm of unspecified part of right bronchus or lung: Secondary | ICD-10-CM | POA: Insufficient documentation

## 2018-06-02 DIAGNOSIS — Z5112 Encounter for antineoplastic immunotherapy: Secondary | ICD-10-CM | POA: Insufficient documentation

## 2018-06-02 DIAGNOSIS — C7989 Secondary malignant neoplasm of other specified sites: Secondary | ICD-10-CM | POA: Insufficient documentation

## 2018-06-02 LAB — CMP (CANCER CENTER ONLY)
ALT: 14 U/L (ref 0–44)
AST: 15 U/L (ref 15–41)
Albumin: 3.4 g/dL — ABNORMAL LOW (ref 3.5–5.0)
Alkaline Phosphatase: 120 U/L (ref 38–126)
Anion gap: 8 (ref 5–15)
BUN: 11 mg/dL (ref 8–23)
CHLORIDE: 104 mmol/L (ref 98–111)
CO2: 26 mmol/L (ref 22–32)
CREATININE: 0.61 mg/dL (ref 0.44–1.00)
Calcium: 10 mg/dL (ref 8.9–10.3)
GFR, Est AFR Am: >60 mL/min (ref >60)
Glucose, Bld: 129 mg/dL — ABNORMAL HIGH (ref 70–99)
POTASSIUM: 4 mmol/L (ref 3.5–5.1)
SODIUM: 138 mmol/L (ref 135–145)
Total Bilirubin: 0.2 mg/dL — ABNORMAL LOW (ref 0.3–1.2)
Total Protein: 7.2 g/dL (ref 6.5–8.1)

## 2018-06-02 LAB — CBC WITH DIFFERENTIAL (CANCER CENTER ONLY)
BASOS ABS: 0.1 10*3/uL (ref 0.0–0.1)
BASOS PCT: 1 %
EOS ABS: 0 10*3/uL (ref 0.0–0.5)
EOS PCT: 0 %
HCT: 29.7 % — ABNORMAL LOW (ref 34.8–46.6)
Hemoglobin: 9.9 g/dL — ABNORMAL LOW (ref 11.6–15.9)
Lymphocytes Relative: 24 %
Lymphs Abs: 1.9 10*3/uL (ref 0.9–3.3)
MCH: 29.8 pg (ref 25.1–34.0)
MCHC: 33.2 g/dL (ref 31.5–36.0)
MCV: 90 fL (ref 79.5–101.0)
MONO ABS: 0.6 10*3/uL (ref 0.1–0.9)
Monocytes Relative: 8 %
Neutro Abs: 5.4 10*3/uL (ref 1.5–6.5)
Neutrophils Relative %: 67 %
PLATELETS: 208 10*3/uL (ref 145–400)
RBC: 3.3 MIL/uL — AB (ref 3.70–5.45)
RDW: 17.5 % — AB (ref 11.2–14.5)
WBC: 8 10*3/uL (ref 3.9–10.3)

## 2018-06-02 MED ORDER — SODIUM CHLORIDE 0.9% FLUSH
10.0000 mL | INTRAVENOUS | Status: DC | PRN
  Administered 2018-06-02: 10 mL
  Filled 2018-06-02: qty 10

## 2018-06-02 MED ORDER — SODIUM CHLORIDE 0.9 % IV SOLN
200.0000 mg | Freq: Once | INTRAVENOUS | Status: AC
  Administered 2018-06-02: 200 mg via INTRAVENOUS
  Filled 2018-06-02: qty 8

## 2018-06-02 MED ORDER — HEPARIN SOD (PORK) LOCK FLUSH 100 UNIT/ML IV SOLN
500.0000 [IU] | Freq: Once | INTRAVENOUS | Status: AC | PRN
  Administered 2018-06-02: 500 [IU]
  Filled 2018-06-02: qty 5

## 2018-06-02 MED ORDER — SODIUM CHLORIDE 0.9 % IV SOLN
Freq: Once | INTRAVENOUS | Status: AC
  Administered 2018-06-02: 15:00:00 via INTRAVENOUS

## 2018-06-02 NOTE — Patient Instructions (Signed)
[facility] Discharge Instructions for Patients Receiving Chemotherapy  Today you received the following chemotherapy agents:  Keytruda.  To help prevent nausea and vomiting after your treatment, we encourage you to take your nausea medication as directed.   If you develop nausea and vomiting that is not controlled by your nausea medication, call the clinic.   BELOW ARE SYMPTOMS THAT SHOULD BE REPORTED IMMEDIATELY:  *FEVER GREATER THAN 100.5 F  *CHILLS WITH OR WITHOUT FEVER  NAUSEA AND VOMITING THAT IS NOT CONTROLLED WITH YOUR NAUSEA MEDICATION  *UNUSUAL SHORTNESS OF BREATH  *UNUSUAL BRUISING OR BLEEDING  TENDERNESS IN MOUTH AND THROAT WITH OR WITHOUT PRESENCE OF ULCERS  *URINARY PROBLEMS  *BOWEL PROBLEMS  UNUSUAL RASH Items with * indicate a potential emergency and should be followed up as soon as possible.  Feel free to call the clinic should you have any questions or concerns. The clinic phone number is [phone].  Please show the CHEMO ALERT CARD at check-in to the Emergency Department and triage nurse.    

## 2018-06-05 ENCOUNTER — Other Ambulatory Visit: Payer: Self-pay | Admitting: Internal Medicine

## 2018-06-07 ENCOUNTER — Encounter (HOSPITAL_BASED_OUTPATIENT_CLINIC_OR_DEPARTMENT_OTHER): Payer: Medicare Other | Attending: Internal Medicine

## 2018-06-07 DIAGNOSIS — T8131XA Disruption of external operation (surgical) wound, not elsewhere classified, initial encounter: Secondary | ICD-10-CM | POA: Diagnosis present

## 2018-06-07 DIAGNOSIS — C3490 Malignant neoplasm of unspecified part of unspecified bronchus or lung: Secondary | ICD-10-CM | POA: Insufficient documentation

## 2018-06-07 DIAGNOSIS — C7989 Secondary malignant neoplasm of other specified sites: Secondary | ICD-10-CM | POA: Diagnosis not present

## 2018-06-07 DIAGNOSIS — Z923 Personal history of irradiation: Secondary | ICD-10-CM | POA: Diagnosis not present

## 2018-06-07 DIAGNOSIS — L98498 Non-pressure chronic ulcer of skin of other sites with other specified severity: Secondary | ICD-10-CM | POA: Diagnosis not present

## 2018-06-07 DIAGNOSIS — I10 Essential (primary) hypertension: Secondary | ICD-10-CM | POA: Diagnosis not present

## 2018-06-07 DIAGNOSIS — Y838 Other surgical procedures as the cause of abnormal reaction of the patient, or of later complication, without mention of misadventure at the time of the procedure: Secondary | ICD-10-CM | POA: Insufficient documentation

## 2018-06-14 ENCOUNTER — Other Ambulatory Visit: Payer: Self-pay | Admitting: Internal Medicine

## 2018-06-22 ENCOUNTER — Other Ambulatory Visit: Payer: Self-pay | Admitting: Medical Oncology

## 2018-06-22 DIAGNOSIS — C3491 Malignant neoplasm of unspecified part of right bronchus or lung: Secondary | ICD-10-CM

## 2018-06-22 DIAGNOSIS — T8131XA Disruption of external operation (surgical) wound, not elsewhere classified, initial encounter: Secondary | ICD-10-CM | POA: Diagnosis not present

## 2018-06-23 ENCOUNTER — Telehealth: Payer: Self-pay | Admitting: Internal Medicine

## 2018-06-23 ENCOUNTER — Telehealth: Payer: Self-pay | Admitting: Hematology

## 2018-06-23 ENCOUNTER — Inpatient Hospital Stay: Payer: Medicare Other

## 2018-06-23 NOTE — Telephone Encounter (Signed)
Gave patient avs and calendar of upcoming appts.  °

## 2018-06-23 NOTE — Telephone Encounter (Signed)
Patients daughter r/s appts due to  Missed treatment

## 2018-06-24 ENCOUNTER — Other Ambulatory Visit: Payer: Self-pay | Admitting: Internal Medicine

## 2018-06-28 ENCOUNTER — Inpatient Hospital Stay (HOSPITAL_BASED_OUTPATIENT_CLINIC_OR_DEPARTMENT_OTHER): Payer: Medicare Other | Admitting: Oncology

## 2018-06-28 ENCOUNTER — Encounter: Payer: Self-pay | Admitting: Oncology

## 2018-06-28 ENCOUNTER — Inpatient Hospital Stay: Payer: Medicare Other

## 2018-06-28 VITALS — BP 146/78 | HR 80 | Temp 98.0°F | Resp 17 | Ht 61.0 in | Wt 119.8 lb

## 2018-06-28 DIAGNOSIS — C7989 Secondary malignant neoplasm of other specified sites: Secondary | ICD-10-CM | POA: Diagnosis not present

## 2018-06-28 DIAGNOSIS — R197 Diarrhea, unspecified: Secondary | ICD-10-CM

## 2018-06-28 DIAGNOSIS — Z95828 Presence of other vascular implants and grafts: Secondary | ICD-10-CM

## 2018-06-28 DIAGNOSIS — C3491 Malignant neoplasm of unspecified part of right bronchus or lung: Secondary | ICD-10-CM

## 2018-06-28 DIAGNOSIS — Z5112 Encounter for antineoplastic immunotherapy: Secondary | ICD-10-CM

## 2018-06-28 DIAGNOSIS — R5382 Chronic fatigue, unspecified: Secondary | ICD-10-CM

## 2018-06-28 LAB — CBC WITH DIFFERENTIAL (CANCER CENTER ONLY)
BASOS PCT: 0 %
Basophils Absolute: 0 10*3/uL (ref 0.0–0.1)
EOS ABS: 0.1 10*3/uL (ref 0.0–0.5)
Eosinophils Relative: 1 %
HEMATOCRIT: 40.8 % (ref 34.8–46.6)
HEMOGLOBIN: 13.6 g/dL (ref 11.6–15.9)
LYMPHS ABS: 1.8 10*3/uL (ref 0.9–3.3)
Lymphocytes Relative: 18 %
MCH: 29.6 pg (ref 25.1–34.0)
MCHC: 33.3 g/dL (ref 31.5–36.0)
MCV: 88.9 fL (ref 79.5–101.0)
Monocytes Absolute: 0.7 10*3/uL (ref 0.1–0.9)
Monocytes Relative: 7 %
NEUTROS ABS: 7.6 10*3/uL — AB (ref 1.5–6.5)
NEUTROS PCT: 74 %
Platelet Count: 167 10*3/uL (ref 145–400)
RBC: 4.59 MIL/uL (ref 3.70–5.45)
RDW: 15.4 % — ABNORMAL HIGH (ref 11.2–14.5)
WBC: 10.2 10*3/uL (ref 3.9–10.3)

## 2018-06-28 LAB — CMP (CANCER CENTER ONLY)
ALBUMIN: 4.2 g/dL (ref 3.5–5.0)
ALK PHOS: 126 U/L (ref 38–126)
ALT: 11 U/L (ref 0–44)
AST: 17 U/L (ref 15–41)
Anion gap: 7 (ref 5–15)
BILIRUBIN TOTAL: 0.4 mg/dL (ref 0.3–1.2)
BUN: 12 mg/dL (ref 8–23)
CALCIUM: 10.9 mg/dL — AB (ref 8.9–10.3)
CO2: 26 mmol/L (ref 22–32)
CREATININE: 0.75 mg/dL (ref 0.44–1.00)
Chloride: 105 mmol/L (ref 98–111)
GFR, Est AFR Am: >60 mL/min (ref >60)
GFR, Estimated: >60 mL/min (ref >60)
GLUCOSE: 94 mg/dL (ref 70–99)
Potassium: 3.6 mmol/L (ref 3.5–5.1)
SODIUM: 138 mmol/L (ref 135–145)
TOTAL PROTEIN: 8.3 g/dL — AB (ref 6.5–8.1)

## 2018-06-28 LAB — TSH: TSH: 1.601 u[IU]/mL (ref 0.308–3.960)

## 2018-06-28 MED ORDER — SODIUM CHLORIDE 0.9% FLUSH
10.0000 mL | INTRAVENOUS | Status: DC | PRN
  Administered 2018-06-28: 10 mL
  Filled 2018-06-28: qty 10

## 2018-06-28 MED ORDER — SODIUM CHLORIDE 0.9% FLUSH
10.0000 mL | Freq: Once | INTRAVENOUS | Status: AC
  Administered 2018-06-28: 10 mL
  Filled 2018-06-28: qty 10

## 2018-06-28 MED ORDER — HEPARIN SOD (PORK) LOCK FLUSH 100 UNIT/ML IV SOLN
250.0000 [IU] | Freq: Once | INTRAVENOUS | Status: DC
  Filled 2018-06-28: qty 5

## 2018-06-28 MED ORDER — SODIUM CHLORIDE 0.9 % IV SOLN
Freq: Once | INTRAVENOUS | Status: AC
Start: 2018-06-28 — End: 2018-06-28
  Administered 2018-06-28: 16:00:00 via INTRAVENOUS
  Filled 2018-06-28: qty 250

## 2018-06-28 MED ORDER — SODIUM CHLORIDE 0.9 % IV SOLN
200.0000 mg | Freq: Once | INTRAVENOUS | Status: AC
  Administered 2018-06-28: 200 mg via INTRAVENOUS
  Filled 2018-06-28: qty 8

## 2018-06-28 MED ORDER — HEPARIN SOD (PORK) LOCK FLUSH 100 UNIT/ML IV SOLN
500.0000 [IU] | Freq: Once | INTRAVENOUS | Status: AC | PRN
Start: 2018-06-28 — End: 2018-06-28
  Administered 2018-06-28: 500 [IU]
  Filled 2018-06-28: qty 5

## 2018-06-28 NOTE — Patient Instructions (Signed)
[facility] Discharge Instructions for Patients Receiving Chemotherapy  Today you received the following chemotherapy agents:  Keytruda.  To help prevent nausea and vomiting after your treatment, we encourage you to take your nausea medication as directed.   If you develop nausea and vomiting that is not controlled by your nausea medication, call the clinic.   BELOW ARE SYMPTOMS THAT SHOULD BE REPORTED IMMEDIATELY:  *FEVER GREATER THAN 100.5 F  *CHILLS WITH OR WITHOUT FEVER  NAUSEA AND VOMITING THAT IS NOT CONTROLLED WITH YOUR NAUSEA MEDICATION  *UNUSUAL SHORTNESS OF BREATH  *UNUSUAL BRUISING OR BLEEDING  TENDERNESS IN MOUTH AND THROAT WITH OR WITHOUT PRESENCE OF ULCERS  *URINARY PROBLEMS  *BOWEL PROBLEMS  UNUSUAL RASH Items with * indicate a potential emergency and should be followed up as soon as possible.  Feel free to call the clinic should you have any questions or concerns. The clinic phone number is [phone].  Please show the CHEMO ALERT CARD at check-in to the Emergency Department and triage nurse.    

## 2018-06-28 NOTE — Progress Notes (Signed)
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  She presented with large central necrotic right lower neck mass in addition to bulky mediastinal and bilateral hilar adenopathy as well as a small hypermetabolic lung lesion diagnosed in February 2019.  PRIOR THERAPY: Palliative radiotherapy to the large necrotic mass of the right neck under the care of Dr. Isidore Moos, completed on February 19, 2018.  CURRENT THERAPY: Palliative systemic chemotherapy with carboplatin for AUC of 5, paclitaxel 175 mg/M2 and Keytruda 200 mg IV every 3 weeks.  First dose 03/03/2018. Beginning with cycle #5 she is receiving Keytruda only.  INTERVAL HISTORY: Faith [name] 67 y.o. female returns for routine follow-up visit accompanied by her niece.  The patient is feeling fine today and has no specific complaints except for intermittent diarrhea.  She has not taken anything for the diarrhea.  She denies fevers and chills.  Denies chest pain, shortness of breath, cough, hemoptysis.  Denies nausea, vomiting, constipation, diarrhea.  Denies recent weight loss or night sweats.  The wound to the right neck is continued to improve and she is followed by the wound clinic.  The patient is here for evaluation prior to cycle #6 of her treatment.  MEDICAL HISTORY: Past Medical History:  Diagnosis Date  . Anxiety   . Arthritis    "thighs; legs; hips" (07/05/2014)  . Bipolar disorder ([place])   . Cholelithiasis 07/2014  . Chronic bronchitis ([place])    "get it q yr"  . Chronic lower back pain   . Depression    pt. use to go to depression clinic, states she still has depression & anxiety but can't get to appt. so she hasn't had any med. for it in a while   . Dyspnea   . GERD (gastroesophageal reflux disease)   . High cholesterol   . History of radiation therapy 02/01/18-  02/12/18   Right Neck/ 30 Gy in 10 fractions.   . Hypertension   . Mass in neck 12/2017   RIGHT SIDE OF NECK  . Peripheral vascular disease ([place])   . Schizophrenia ([place])   . Stroke [facility])     ALLERGIES:  is allergic to penicillins; codeine; and percocet [oxycodone-acetaminophen].  MEDICATIONS:  Current Outpatient Medications  Medication Sig Dispense Refill  . albuterol (PROVENTIL HFA;VENTOLIN HFA) 108 (90 Base) MCG/ACT inhaler Inhale 1-2 puffs into the lungs every 6 (six) hours as needed for wheezing or shortness of breath.    Marland Kitchen albuterol (PROVENTIL) (2.5 MG/3ML) 0.083% nebulizer solution Take 2.5 mg by nebulization every 6 (six) hours as needed for wheezing or shortness of breath.    . clopidogrel (PLAVIX) 75 MG tablet Take 1 tablet (75 mg total) by mouth daily. 30 tablet 0  . diclofenac sodium (VOLTAREN) 1 % GEL Apply 2 g topically daily as needed (pain).     Marland Kitchen diphenhydrAMINE (BENADRYL) 25 MG tablet Take 2 tablets (50 mg total) by mouth at bedtime as needed for sleep. 20 tablet 0  . fluticasone (FLONASE) 50 MCG/ACT nasal spray Place 2 sprays into both nostrils daily.   0  . furosemide (LASIX) 20 MG tablet Take 20 mg by mouth daily as needed for fluid or edema.     . gabapentin (NEURONTIN) 300 MG capsule Take 300 mg by mouth 3 (three) times daily.     Marland Kitchen ibuprofen (ADVIL,MOTRIN) 800 MG tablet Take  3 tablets by mouth 3 (three) times daily.    Marland Kitchen lidocaine (XYLOCAINE) 2 % solution Caregiver: Mix 1part 2% viscous lidocaine, 1part H20. Swish & swallow 52mL of diluted mixture, 83min before meals and at bedtime, up to QID 100 mL 5  . lidocaine-prilocaine (EMLA) cream Apply 1 application topically as needed. 30 g 1  . Linaclotide (LINZESS) 290 MCG CAPS capsule Take 290-580 mcg by mouth daily as needed (constipation).     Marland Kitchen loratadine (CLARITIN) 10 MG tablet Take 10 mg by mouth daily.    . mometasone (ELOCON) 0.1 % ointment Apply 1 application topically daily as needed (irritation).     . nicotine  polacrilex (NICORETTE) 2 MG gum Take 2 mg by mouth as needed for smoking cessation.    Marland Kitchen omeprazole (PRILOSEC) 20 MG capsule Take 20 mg by mouth 2 (two) times daily before a meal.    . Oxycodone HCl 20 MG TABS Take 1 tablet by mouth 4 (four) times daily as needed.  0  . potassium chloride SA (K-DUR,KLOR-CON) 20 MEQ tablet TAKE 2 TABLETS BY MOUTH AS A SINGLE DOSE 20 tablet 0  . prochlorperazine (COMPAZINE) 10 MG tablet Take 1 tablet (10 mg total) by mouth every 6 (six) hours as needed for nausea or vomiting. 30 tablet 0  . SANTYL ointment APPLY  AA QD  1  . Vitamin D, Ergocalciferol, (DRISDOL) 50000 units CAPS capsule Take 50,000 Units by mouth every 7 (seven) days.    Marland Kitchen acetaminophen (TYLENOL) 325 MG tablet Take 2 tablets (650 mg total) by mouth every 6 (six) hours as needed for mild pain (or Fever >/= 101). (Patient not taking: Reported on 06/28/2018)    . AMITIZA 24 MCG capsule Take 24 mcg by mouth 2 (two) times daily.  0  . methocarbamol (ROBAXIN) 750 MG tablet Take 750 mg by mouth 2 (two) times daily as needed for muscle spasms.     Marland Kitchen oxyCODONE-acetaminophen (PERCOCET) 10-325 MG tablet Take 1 tablet by mouth every 6 (six) hours as needed for pain. (Patient not taking: Reported on 06/28/2018) 30 tablet 0  . polyethylene glycol (MIRALAX / GLYCOLAX) packet Take 17 g by mouth daily. (Patient not taking: Reported on 05/12/2018) 14 each 0   No current facility-administered medications for this visit.     SURGICAL HISTORY:  Past Surgical History:  Procedure Laterality Date  . ABDOMINAL AORTOGRAM N/A 01/07/2017   Procedure: Abdominal Aortogram;  Surgeon: Waynetta Sandy, MD;  Location: [facility];  Service: Cardiovascular;  Laterality: N/A;  . ABDOMINAL HYSTERECTOMY    . ANKLE FRACTURE SURGERY Right   . ANKLE HARDWARE REMOVAL Right   . APPENDECTOMY  ~ 1963  . BREAST BIOPSY Bilateral   . BREAST CYST EXCISION Bilateral    "not cancer"  . CATARACT EXTRACTION W/ INTRAOCULAR LENS   IMPLANT, BILATERAL    . CHOLECYSTECTOMY N/A 07/05/2014   Procedure: LAPAROSCOPIC CHOLECYSTECTOMY WITH INTRAOPERATIVE CHOLANGIOGRAM;  Surgeon: Gayland Curry, MD;  Location: [facility];  Service: General;  Laterality: N/A;  . EXCISIONAL HEMORRHOIDECTOMY    . FEMORAL-POPLITEAL BYPASS GRAFT Right 01/15/2017   Procedure: BYPASS GRAFT FEMORAL-POPLITEAL ARTERY RIGHT LEG;  Surgeon: Waynetta Sandy, MD;  Location: [facility];  Service: Vascular;  Laterality: Right;  . IR IMAGING GUIDED PORT INSERTION  03/08/2018  . IR US GUIDE VASC ACCESS LEFT  03/08/2018  . LAPAROSCOPIC CHOLECYSTECTOMY  07/05/2014  . LOWER EXTREMITY ANGIOGRAPHY Bilateral 01/07/2017   Procedure: Lower Extremity Angiography;  Surgeon: Waynetta Sandy,  MD;  Location: [facility];  Service: Cardiovascular;  Laterality: Bilateral;  . MASS BIOPSY Right 12/25/2017   Procedure: INCISIONAL RIGHT NECK MASS BIOPSY;  Surgeon: Helayne Seminole, MD;  Location: [facility];  Service: ENT;  Laterality: Right;  . MULTIPLE TOOTH EXTRACTIONS  05/2013   "pulled my upper teeth"  . PILONIDAL CYST EXCISION      REVIEW OF SYSTEMS:   Review of Systems  Constitutional: Negative for appetite change, chills, fatigue, fever and unexpected weight change.  HENT:   Negative for nosebleeds, sore throat and trouble swallowing.  Positive for right neck mass.  Eyes: Negative for eye problems and icterus.  Respiratory: Negative for cough, hemoptysis, shortness of breath and wheezing.   Cardiovascular: Negative for chest pain and leg swelling.  Gastrointestinal: Negative for abdominal pain, constipation, nausea and vomiting. Positive for intermittent diarrhea. Genitourinary: Negative for bladder incontinence, difficulty urinating, dysuria, frequency and hematuria.   Musculoskeletal: Negative for back pain, gait problem, neck pain and neck stiffness.  Skin: Negative for itching and rash.  Neurological: Negative for dizziness, extremity weakness, gait problem,  headaches, light-headedness and seizures.  Hematological: Negative for adenopathy. Does not bruise/bleed easily.  Psychiatric/Behavioral: Negative for confusion, depression and sleep disturbance. The patient is not nervous/anxious.     PHYSICAL EXAMINATION:  Blood pressure (!) 146/78, pulse 80, temperature 98 F (36.7 C), temperature source Oral, resp. rate 17, height 5\' 1"  (1.549 m), weight 119 lb 12.8 oz (54.3 kg), SpO2 96 %.  ECOG PERFORMANCE STATUS: 1 - Symptomatic but completely ambulatory  Physical Exam  Constitutional: Oriented to person, place, and time and well-developed, well-nourished, and in no distress. No distress.  HENT:  Head: Normocephalic and atraumatic.  Mouth/Throat: Oropharynx is clear and moist. No oropharyngeal exudate.  Eyes: Conjunctivae are normal. Right eye exhibits no discharge. Left eye exhibits no discharge. No scleral icterus.  Neck: Normal range of motion.  Open wound to the right neck secondary to previous radiation.  Cardiovascular: Normal rate, regular rhythm, normal heart sounds and intact distal pulses.   Pulmonary/Chest: Effort normal and breath sounds normal. No respiratory distress. No wheezes. No rales.  Abdominal: Soft. Bowel sounds are normal. Exhibits no distension and no mass. There is no tenderness.  Musculoskeletal: Normal range of motion. Exhibits no edema.  Lymphadenopathy:    No cervical adenopathy.  Neurological: Alert and oriented to person, place, and time. Exhibits normal muscle tone. Gait normal. Coordination normal.  Skin: Skin is warm and dry. No rash noted. Not diaphoretic. No erythema. No pallor.  Psychiatric: Mood, memory and judgment normal.  Vitals reviewed.  LABORATORY DATA: Lab Results  Component Value Date   WBC 10.2 06/28/2018   HGB 13.6 06/28/2018   HCT 40.8 06/28/2018   MCV 88.9 06/28/2018   PLT 167 06/28/2018      Chemistry      Component Value Date/Time   NA 138 06/02/2018 1330   K 4.0 06/02/2018 1330    CL 104 06/02/2018 1330   CO2 26 06/02/2018 1330   BUN 11 06/02/2018 1330   CREATININE 0.61 06/02/2018 1330      Component Value Date/Time   CALCIUM 10.0 06/02/2018 1330   ALKPHOS 120 06/02/2018 1330   AST 15 06/02/2018 1330   ALT 14 06/02/2018 1330   BILITOT 0.2 (L) 06/02/2018 1330       RADIOGRAPHIC STUDIES:  No results found.   ASSESSMENT/PLAN:  Non-small cell carcinoma of right lung, stage 4 (HCC) This is a very pleasant 67  year old African-American female with metastatic poorly differentiated carcinoma highly suspicious for primary lung cancer versus head and neck cancer.  She is status post palliative radiotherapy to the large right neck mass. The patient initially received systemic chemotherapy with carboplatin, paclitaxel and Keytruda status post 4 cycles and starting with cycle 5 was started on maintenance Keytruda.   The patient continues to tolerate this treatment well with no concerning complaints except for intermittent diarrhea.  Recommend for her to proceed with cycle 6 of her treatment today as scheduled.  She will have a restaging CT scan of the neck, chest, abdomen, pelvis prior to her next visit.  For pain management she will continue with the current pain medication oxycodone.  For diarrhea, she was advised to begin Imodium.  She was advised to call immediately if she has any concerning symptoms in the interval. The patient voices understanding of current disease status and treatment options and is in agreement with the current care plan.  All questions were answered. The patient knows to call the clinic with any problems, questions or concerns. We can certainly see the patient much sooner if necessary.    Orders Placed This Encounter  Procedures  . CT ABDOMEN PELVIS W CONTRAST    Standing Status:   Future    Standing Expiration Date:   06/29/2019    Order Specific Question:   If indicated for the ordered procedure, I authorize the administration of  contrast media per Radiology protocol    Answer:   Yes    Order Specific Question:   Preferred imaging location?    Answer:   [facility]    Order Specific Question:   Radiology Contrast Protocol - do NOT remove file path    Answer:   \\[email or web address]    Order Specific Question:   ** REASON FOR EXAM (FREE TEXT)    Answer:   Stage IV lung cancer. Restaging.  . CT CHEST W CONTRAST    Standing Status:   Future    Standing Expiration Date:   06/29/2019    Order Specific Question:   If indicated for the ordered procedure, I authorize the administration of contrast media per Radiology protocol    Answer:   Yes    Order Specific Question:   Preferred imaging location?    Answer:   [facility]    Order Specific Question:   Radiology Contrast Protocol - do NOT remove file path    Answer:   \\[email or web address]    Order Specific Question:   ** REASON FOR EXAM (FREE TEXT)    Answer:   Stage IV lung cancer. Restaging.  . CT Soft Tissue Neck W Contrast    Standing Status:   Future    Standing Expiration Date:   06/28/2019    Order Specific Question:   ** REASON FOR EXAM (FREE TEXT)    Answer:   Stage IV lung cancer. Restaging.    Order Specific Question:   If indicated for the ordered procedure, I authorize the administration of contrast media per Radiology protocol    Answer:   Yes    Order Specific Question:   Preferred imaging location?    Answer:   [facility]    Order Specific Question:   Radiology Contrast Protocol - do NOT remove file path    Answer:   \\[email or web address]  . CBC with Differential ([facility] Only)    Standing Status:   Standing    Number of Occurrences:  20    Standing Expiration Date:   06/29/2019  . CMP ([facility] only)    Standing Status:   Standing    Number of Occurrences:   20    Standing Expiration Date:   06/29/2019   Mikey Bussing, DNP, AGPCNP-BC,  AOCNP 06/28/18

## 2018-06-28 NOTE — Assessment & Plan Note (Signed)
This is a very pleasant 67 year old African-American female with metastatic poorly differentiated carcinoma highly suspicious for primary lung cancer versus head and neck cancer.  She is status post palliative radiotherapy to the large right neck mass. The patient initially received systemic chemotherapy with carboplatin, paclitaxel and Keytruda status post 4 cycles and starting with cycle 5 was started on maintenance Keytruda.   The patient continues to tolerate this treatment well with no concerning complaints except for intermittent diarrhea.  Recommend for her to proceed with cycle 6 of her treatment today as scheduled.  She will have a restaging CT scan of the neck, chest, abdomen, pelvis prior to her next visit.  For pain management she will continue with the current pain medication oxycodone.  For diarrhea, she was advised to begin Imodium.  She was advised to call immediately if she has any concerning symptoms in the interval. The patient voices understanding of current disease status and treatment options and is in agreement with the current care plan.  All questions were answered. The patient knows to call the clinic with any problems, questions or concerns. We can certainly see the patient much sooner if necessary.

## 2018-06-29 ENCOUNTER — Telehealth: Payer: Self-pay | Admitting: Oncology

## 2018-06-29 NOTE — Telephone Encounter (Signed)
Appts already scheduled per 7/29 los - no additional appts added.

## 2018-07-01 ENCOUNTER — Other Ambulatory Visit: Payer: Self-pay | Admitting: Internal Medicine

## 2018-07-05 ENCOUNTER — Inpatient Hospital Stay: Payer: Medicare Other | Attending: Internal Medicine

## 2018-07-05 ENCOUNTER — Ambulatory Visit: Payer: Medicare Other | Attending: Radiation Oncology

## 2018-07-05 DIAGNOSIS — C3491 Malignant neoplasm of unspecified part of right bronchus or lung: Secondary | ICD-10-CM | POA: Insufficient documentation

## 2018-07-05 DIAGNOSIS — C7989 Secondary malignant neoplasm of other specified sites: Secondary | ICD-10-CM | POA: Insufficient documentation

## 2018-07-05 DIAGNOSIS — Z5112 Encounter for antineoplastic immunotherapy: Secondary | ICD-10-CM | POA: Insufficient documentation

## 2018-07-09 ENCOUNTER — Other Ambulatory Visit: Payer: Self-pay | Admitting: Internal Medicine

## 2018-07-14 ENCOUNTER — Encounter: Payer: Medicare Other | Admitting: Nutrition

## 2018-07-14 ENCOUNTER — Ambulatory Visit: Payer: Medicare Other

## 2018-07-14 ENCOUNTER — Other Ambulatory Visit: Payer: Medicare Other

## 2018-07-14 ENCOUNTER — Ambulatory Visit: Payer: Medicare Other | Admitting: Nurse Practitioner

## 2018-07-16 ENCOUNTER — Ambulatory Visit (HOSPITAL_COMMUNITY)
Admission: RE | Admit: 2018-07-16 | Discharge: 2018-07-16 | Disposition: A | Discharge status: Home / Self Care | Payer: Medicare Other | Source: Ambulatory Visit | Attending: Oncology | Admitting: Oncology

## 2018-07-16 ENCOUNTER — Encounter (HOSPITAL_COMMUNITY): Payer: Self-pay

## 2018-07-16 DIAGNOSIS — C3491 Malignant neoplasm of unspecified part of right bronchus or lung: Secondary | ICD-10-CM | POA: Diagnosis present

## 2018-07-16 DIAGNOSIS — I2510 Atherosclerotic heart disease of native coronary artery without angina pectoris: Secondary | ICD-10-CM | POA: Diagnosis not present

## 2018-07-16 DIAGNOSIS — I700 Atherosclerosis of aorta: Secondary | ICD-10-CM | POA: Diagnosis not present

## 2018-07-16 DIAGNOSIS — I6522 Occlusion and stenosis of left carotid artery: Secondary | ICD-10-CM | POA: Diagnosis not present

## 2018-07-16 DIAGNOSIS — M879 Osteonecrosis, unspecified: Secondary | ICD-10-CM | POA: Diagnosis not present

## 2018-07-16 DIAGNOSIS — J439 Emphysema, unspecified: Secondary | ICD-10-CM | POA: Diagnosis not present

## 2018-07-16 DIAGNOSIS — R221 Localized swelling, mass and lump, neck: Secondary | ICD-10-CM | POA: Diagnosis not present

## 2018-07-16 MED ORDER — HEPARIN SOD (PORK) LOCK FLUSH 100 UNIT/ML IV SOLN: 500.0000 [IU] | Freq: Once | INTRAVENOUS | Status: DC

## 2018-07-16 MED ORDER — HEPARIN SOD (PORK) LOCK FLUSH 100 UNIT/ML IV SOLN
INTRAVENOUS | Status: AC
  Filled 2018-07-16: qty 5

## 2018-07-16 MED ORDER — IOHEXOL 300 MG/ML  SOLN
100.0000 mL | Freq: Once | INTRAMUSCULAR | Status: AC | PRN
  Administered 2018-07-16: 100 mL via INTRAVENOUS

## 2018-07-21 ENCOUNTER — Inpatient Hospital Stay: Payer: Medicare Other | Admitting: Nutrition

## 2018-07-21 ENCOUNTER — Encounter: Payer: Self-pay | Admitting: Nurse Practitioner

## 2018-07-21 ENCOUNTER — Inpatient Hospital Stay: Payer: Medicare Other

## 2018-07-21 ENCOUNTER — Telehealth: Payer: Self-pay | Admitting: Internal Medicine

## 2018-07-21 ENCOUNTER — Inpatient Hospital Stay (HOSPITAL_BASED_OUTPATIENT_CLINIC_OR_DEPARTMENT_OTHER): Payer: Medicare Other | Admitting: Nurse Practitioner

## 2018-07-21 VITALS — BP 150/69 | HR 74 | Temp 98.1°F | Resp 15 | Ht 61.0 in | Wt 116.6 lb

## 2018-07-21 DIAGNOSIS — G893 Neoplasm related pain (acute) (chronic): Secondary | ICD-10-CM

## 2018-07-21 DIAGNOSIS — Z95828 Presence of other vascular implants and grafts: Secondary | ICD-10-CM

## 2018-07-21 DIAGNOSIS — R197 Diarrhea, unspecified: Secondary | ICD-10-CM

## 2018-07-21 DIAGNOSIS — C3491 Malignant neoplasm of unspecified part of right bronchus or lung: Secondary | ICD-10-CM

## 2018-07-21 DIAGNOSIS — Z720 Tobacco use: Secondary | ICD-10-CM

## 2018-07-21 DIAGNOSIS — Z5112 Encounter for antineoplastic immunotherapy: Secondary | ICD-10-CM

## 2018-07-21 DIAGNOSIS — C7989 Secondary malignant neoplasm of other specified sites: Secondary | ICD-10-CM

## 2018-07-21 DIAGNOSIS — I10 Essential (primary) hypertension: Secondary | ICD-10-CM

## 2018-07-21 DIAGNOSIS — Z923 Personal history of irradiation: Secondary | ICD-10-CM

## 2018-07-21 DIAGNOSIS — R5382 Chronic fatigue, unspecified: Secondary | ICD-10-CM

## 2018-07-21 LAB — CMP (CANCER CENTER ONLY)
ALT: 9 U/L (ref 0–44)
AST: 14 U/L — ABNORMAL LOW (ref 15–41)
Albumin: 3.7 g/dL (ref 3.5–5.0)
Alkaline Phosphatase: 112 U/L (ref 38–126)
Anion gap: 7 (ref 5–15)
BILIRUBIN TOTAL: 0.4 mg/dL (ref 0.3–1.2)
BUN: 16 mg/dL (ref 8–23)
CHLORIDE: 105 mmol/L (ref 98–111)
CO2: 23 mmol/L (ref 22–32)
Calcium: 9.6 mg/dL (ref 8.9–10.3)
Creatinine: 0.62 mg/dL (ref 0.44–1.00)
GFR, Est AFR Am: >60 mL/min (ref >60)
GFR, Estimated: >60 mL/min (ref >60)
Glucose, Bld: 71 mg/dL (ref 70–99)
POTASSIUM: 4 mmol/L (ref 3.5–5.1)
Sodium: 135 mmol/L (ref 135–145)
TOTAL PROTEIN: 7.4 g/dL (ref 6.5–8.1)

## 2018-07-21 LAB — CBC WITH DIFFERENTIAL (CANCER CENTER ONLY)
Basophils Absolute: 0 10*3/uL (ref 0.0–0.1)
Basophils Relative: 1 %
EOS PCT: 1 %
Eosinophils Absolute: 0 10*3/uL (ref 0.0–0.5)
HCT: 39.6 % (ref 34.8–46.6)
Hemoglobin: 13.2 g/dL (ref 11.6–15.9)
LYMPHS PCT: 33 %
Lymphs Abs: 2 10*3/uL (ref 0.9–3.3)
MCH: 29.2 pg (ref 25.1–34.0)
MCHC: 33.2 g/dL (ref 31.5–36.0)
MCV: 87.9 fL (ref 79.5–101.0)
Monocytes Absolute: 0.6 10*3/uL (ref 0.1–0.9)
Monocytes Relative: 9 %
Neutro Abs: 3.4 10*3/uL (ref 1.5–6.5)
Neutrophils Relative %: 56 %
PLATELETS: 150 10*3/uL (ref 145–400)
RBC: 4.51 MIL/uL (ref 3.70–5.45)
RDW: 15.6 % — ABNORMAL HIGH (ref 11.2–14.5)
WBC Count: 6.1 10*3/uL (ref 3.9–10.3)

## 2018-07-21 LAB — TSH: TSH: 0.699 u[IU]/mL (ref 0.308–3.960)

## 2018-07-21 MED ORDER — SODIUM CHLORIDE 0.9 % IV SOLN
200.0000 mg | Freq: Once | INTRAVENOUS | Status: AC
  Administered 2018-07-21: 200 mg via INTRAVENOUS
  Filled 2018-07-21: qty 8

## 2018-07-21 MED ORDER — SODIUM CHLORIDE 0.9% FLUSH
10.0000 mL | Freq: Once | INTRAVENOUS | Status: AC
  Administered 2018-07-21: 10 mL
  Filled 2018-07-21: qty 10

## 2018-07-21 MED ORDER — SODIUM CHLORIDE 0.9% FLUSH
10.0000 mL | INTRAVENOUS | Status: DC | PRN
  Administered 2018-07-21: 10 mL
  Filled 2018-07-21: qty 10

## 2018-07-21 MED ORDER — HEPARIN SOD (PORK) LOCK FLUSH 100 UNIT/ML IV SOLN
500.0000 [IU] | Freq: Once | INTRAVENOUS | Status: AC | PRN
  Administered 2018-07-21: 500 [IU]
  Filled 2018-07-21: qty 5

## 2018-07-21 MED ORDER — SODIUM CHLORIDE 0.9 % IV SOLN
Freq: Once | INTRAVENOUS | Status: AC
  Administered 2018-07-21: 16:00:00 via INTRAVENOUS
  Filled 2018-07-21: qty 250

## 2018-07-21 NOTE — Patient Instructions (Signed)
[facility] Discharge Instructions for Patients Receiving Chemotherapy  Today you received the following chemotherapy agents :  Keytruda.  To help prevent nausea and vomiting after your treatment, we encourage you to take your nausea medication as prescribed.   If you develop nausea and vomiting that is not controlled by your nausea medication, call the clinic.   BELOW ARE SYMPTOMS THAT SHOULD BE REPORTED IMMEDIATELY:  *FEVER GREATER THAN 100.5 F  *CHILLS WITH OR WITHOUT FEVER  NAUSEA AND VOMITING THAT IS NOT CONTROLLED WITH YOUR NAUSEA MEDICATION  *UNUSUAL SHORTNESS OF BREATH  *UNUSUAL BRUISING OR BLEEDING  TENDERNESS IN MOUTH AND THROAT WITH OR WITHOUT PRESENCE OF ULCERS  *URINARY PROBLEMS  *BOWEL PROBLEMS  UNUSUAL RASH Items with * indicate a potential emergency and should be followed up as soon as possible.  Feel free to call the clinic should you have any questions or concerns. The clinic phone number is [phone].  Please show the CHEMO ALERT CARD at check-in to the Emergency Department and triage nurse.  

## 2018-07-21 NOTE — Progress Notes (Signed)
Nutrition follow-up completed with patient during infusion for lung cancer. Weight decreased was documented as 116.6 pounds August 21 decreased from 119.8 pounds July 29. Patient reports she still has occasional nausea. She reports she alternates between constipation and diarrhea. Reports she has decreased Ensure Enlive to 1 a day because she cannot afford it. She is still being followed by the wound clinic.  Nutrition diagnosis: Severe malnutrition continues.  Intervention: Patient was educated to continue strategies for increased calories and protein. Recommended patient increase Ensure Enlive to 5 daily. Provided 1 complementary case of Ensure Enlive as well as coupons. Questions were answered.  Teach back method used.  Contact information provided.  Monitoring, evaluation, goals: Patient will tolerate increased calories and protein to minimize further weight loss and support healing.  Next visit: Wednesday, September 11 during infusion.  **Disclaimer: This note was dictated with voice recognition software. Similar sounding words can inadvertently be transcribed and this note may contain transcription errors which may not have been corrected upon publication of note.**

## 2018-07-21 NOTE — Telephone Encounter (Signed)
Appts scheduled AVS/Calendar printed per 8/21 los °

## 2018-07-21 NOTE — Progress Notes (Addendum)
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 She presented with large central necrotic right lower neck mass in addition to bulky mediastinal and bilateral hilar adenopathy as well as a small hypermetabolic lung lesion diagnosed in February 2019.  PRIOR THERAPY: Palliative radiotherapy to the large necrotic mass of the right neck under the care of Dr. Isidore Moos, completed on February 19, 2018.  CURRENT THERAPY: Palliative systemic chemotherapy with carboplatin for AUC of 5, paclitaxel 175 mg/M2 and Keytruda 200 mg IV every 3 weeks. First dose 03/03/2018. Beginning with cycle #5 she is receiving Keytruda only.   INTERVAL HISTORY: Ms. Martinique returns for follow up and review of restaging CT as scheduled. She completed cycle 6 keytruda 06/28/18. She feels treatment is going ok. Appetite is normal. Activity level fluctuates. She has occasional diarrhea 1-2 times per week, resolves with imodium. No blood in stool. Denies fever or chills. Denies cough, dyspnea, wheezing, or hemoptysis. She continues to have right side pain including neck, shoulder, upper chest, and arm which she manages with oxycodone 20 mg 4x daily PRN per pain clinic. She continues wound care twice weekly for right neck wound.   REVIEW OF SYSTEMS:   Constitutional: Denies fevers, chills or abnormal weight  loss  Ears, nose, mouth, throat, and face: Denies mucositis or sore throat Respiratory: Denies cough, dyspnea or wheezes Cardiovascular: Denies palpitation, chest discomfort or lower extremity swelling Gastrointestinal:  Denies nausea, vomiting, constipation, hematochezia, heartburn or change in bowel habits (+) periodic diarrhea  Skin: Denies abnormal skin rashes (+) right neck wound  Lymphatics: Denies new lymphadenopathy or easy bruising Neurological:Denies numbness, tingling or new weaknesses MSK: (+) RUE pain including neck, shoulder, upper chest, arm, hand  All other systems were reviewed with the patient and are negative.  MEDICAL HISTORY:  Past Medical History:  Diagnosis Date  . Anxiety   . Arthritis    "thighs; legs; hips" (07/05/2014)  . Bipolar disorder ([place])   . Cholelithiasis 07/2014  . Chronic bronchitis ([place])    "get it q yr"  . Chronic lower back pain   . Depression    pt. use to go to depression clinic, states she still has depression & anxiety but can't get to appt. so she hasn't had any med. for it in a while   . Dyspnea   . GERD (gastroesophageal reflux disease)   . High cholesterol   . History of radiation therapy 02/01/18- 02/12/18   Right Neck/ 30 Gy in 10 fractions.   . Hypertension   . Mass in neck 12/2017   RIGHT SIDE OF NECK  . Peripheral vascular disease ([place])   . Schizophrenia ([place])   . Stroke [facility])     SURGICAL HISTORY: Past Surgical History:  Procedure Laterality Date  . ABDOMINAL AORTOGRAM N/A 01/07/2017   Procedure: Abdominal Aortogram;  Surgeon: Waynetta Sandy, MD;  Location: [place] [facility];  Service: Cardiovascular;  Laterality: N/A;  . ABDOMINAL HYSTERECTOMY    .  ANKLE FRACTURE SURGERY Right   . ANKLE HARDWARE REMOVAL Right   . APPENDECTOMY  ~ 1963  . BREAST BIOPSY Bilateral   . BREAST CYST EXCISION Bilateral    "not cancer"  . CATARACT EXTRACTION W/ INTRAOCULAR LENS  IMPLANT, BILATERAL    . CHOLECYSTECTOMY N/A 07/05/2014    Procedure: LAPAROSCOPIC CHOLECYSTECTOMY WITH INTRAOPERATIVE CHOLANGIOGRAM;  Surgeon: Gayland Curry, MD;  Location: [place];  Service: General;  Laterality: N/A;  . EXCISIONAL HEMORRHOIDECTOMY    . FEMORAL-POPLITEAL BYPASS GRAFT Right 01/15/2017   Procedure: BYPASS GRAFT FEMORAL-POPLITEAL ARTERY RIGHT LEG;  Surgeon: Waynetta Sandy, MD;  Location: [place];  Service: Vascular;  Laterality: Right;  . IR IMAGING GUIDED PORT INSERTION  03/08/2018  . IR US GUIDE VASC ACCESS LEFT  03/08/2018  . LAPAROSCOPIC CHOLECYSTECTOMY  07/05/2014  . LOWER EXTREMITY ANGIOGRAPHY Bilateral 01/07/2017   Procedure: Lower Extremity Angiography;  Surgeon: Waynetta Sandy, MD;  Location: [facility];  Service: Cardiovascular;  Laterality: Bilateral;  . MASS BIOPSY Right 12/25/2017   Procedure: INCISIONAL RIGHT NECK MASS BIOPSY;  Surgeon: Helayne Seminole, MD;  Location: [place];  Service: ENT;  Laterality: Right;  . MULTIPLE TOOTH EXTRACTIONS  05/2013   "pulled my upper teeth"  . PILONIDAL CYST EXCISION      I have reviewed the social history and family history with the patient and they are unchanged from previous note.  ALLERGIES:  is allergic to penicillins; codeine; and percocet [oxycodone-acetaminophen].  MEDICATIONS:  Current Outpatient Medications  Medication Sig Dispense Refill  . acetaminophen (TYLENOL) 325 MG tablet Take 2 tablets (650 mg total) by mouth every 6 (six) hours as needed for mild pain (or Fever >/= 101).    Marland Kitchen albuterol (PROVENTIL HFA;VENTOLIN HFA) 108 (90 Base) MCG/ACT inhaler Inhale 1-2 puffs into the lungs every 6 (six) hours as needed for wheezing or shortness of breath.    Marland Kitchen albuterol (PROVENTIL) (2.5 MG/3ML) 0.083% nebulizer solution Take 2.5 mg by nebulization every 6 (six) hours as needed for wheezing or shortness of breath.    . AMITIZA 24 MCG capsule Take 24 mcg by mouth 2 (two) times daily.  0  . clopidogrel (PLAVIX) 75 MG tablet Take 1 tablet (75 mg total) by mouth daily.  30 tablet 0  . diclofenac sodium (VOLTAREN) 1 % GEL Apply 2 g topically daily as needed (pain).     Marland Kitchen diphenhydrAMINE (BENADRYL) 25 MG tablet Take 2 tablets (50 mg total) by mouth at bedtime as needed for sleep. 20 tablet 0  . fluticasone (FLONASE) 50 MCG/ACT nasal spray Place 2 sprays into both nostrils daily.   0  . furosemide (LASIX) 20 MG tablet Take 20 mg by mouth daily as needed for fluid or edema.     . gabapentin (NEURONTIN) 300 MG capsule Take 300 mg by mouth 3 (three) times daily.     Marland Kitchen ibuprofen (ADVIL,MOTRIN) 800 MG tablet Take 3 tablets by mouth 3 (three) times daily.    Marland Kitchen lidocaine (XYLOCAINE) 2 % solution Caregiver: Mix 1part 2% viscous lidocaine, 1part H20. Swish & swallow 65mL of diluted mixture, 41min before meals and at bedtime, up to QID 100 mL 5  . lidocaine-prilocaine (EMLA) cream Apply 1 application topically as needed. 30 g 1  . Linaclotide (LINZESS) 290 MCG CAPS capsule Take 290-580 mcg by mouth daily as needed (constipation).     Marland Kitchen loratadine (CLARITIN) 10 MG tablet Take 10 mg by mouth daily.    . methocarbamol (ROBAXIN) 750 MG  tablet Take 750 mg by mouth 2 (two) times daily as needed for muscle spasms.     . mometasone (ELOCON) 0.1 % ointment Apply 1 application topically daily as needed (irritation).     . nicotine polacrilex (NICORETTE) 2 MG gum Take 2 mg by mouth as needed for smoking cessation.    Marland Kitchen omeprazole (PRILOSEC) 20 MG capsule Take 20 mg by mouth 2 (two) times daily before a meal.    . Oxycodone HCl 20 MG TABS Take 1 tablet by mouth 4 (four) times daily as needed.  0  . oxyCODONE-acetaminophen (PERCOCET) 10-325 MG tablet Take 1 tablet by mouth every 6 (six) hours as needed for pain. 30 tablet 0  . polyethylene glycol (MIRALAX / GLYCOLAX) packet Take 17 g by mouth daily. 14 each 0  . potassium chloride SA (K-DUR,KLOR-CON) 20 MEQ tablet TAKE 2 TABLETS AS A SINGLE DOSE 20 tablet 0  . prochlorperazine (COMPAZINE) 10 MG tablet Take 1 tablet (10 mg total) by mouth  every 6 (six) hours as needed for nausea or vomiting. 30 tablet 0  . SANTYL ointment APPLY  AA QD  1  . Vitamin D, Ergocalciferol, (DRISDOL) 50000 units CAPS capsule Take 50,000 Units by mouth every 7 (seven) days.     No current facility-administered medications for this visit.     PHYSICAL EXAMINATION: ECOG PERFORMANCE STATUS: 1 - Symptomatic but completely ambulatory  Vitals:   07/21/18 1422  BP: (!) 150/69  Pulse: 74  Resp: 15  Temp: 98.1 F (36.7 C)  SpO2: 100%   Filed Weights   07/21/18 1422  Weight: 116 lb 9.6 oz (52.9 kg)    GENERAL:alert, no distress and comfortable SKIN: no rashes or significant lesions EYES: sclera clear OROPHARYNX:no thrush or ulcers NECK: open wound to right low neck with minimal drainage  LYMPH:  no palpable cervical or supraclavicular lymphadenopathy  LUNGS: distant breath sounds with normal breathing effort HEART: regular rate & rhythm, no lower extremity edema ABDOMEN:abdomen soft, non-tender and normal bowel sounds Musculoskeletal:no cyanosis of digits and no clubbing  NEURO: alert & oriented x 3 with fluent speech PAC without erythema        LABORATORY DATA:  I have reviewed the data as listed CBC Latest Ref Rng & Units 07/21/2018 06/28/2018 06/02/2018  WBC 3.9 - 10.3 K/uL 6.1 10.2 8.0  Hemoglobin 11.6 - 15.9 g/dL 13.2 13.6 9.9(L)  Hematocrit 34.8 - 46.6 % 39.6 40.8 29.7(L)  Platelets 145 - 400 K/uL 150 167 208     CMP Latest Ref Rng & Units 07/21/2018 06/28/2018 06/02/2018  Glucose 70 - 99 mg/dL 71 94 129(H)  BUN 8 - 23 mg/dL 16 12 11   Creatinine 0.44 - 1.00 mg/dL 0.62 0.75 0.61  Sodium 135 - 145 mmol/L 135 138 138  Potassium 3.5 - 5.1 mmol/L 4.0 3.6 4.0  Chloride 98 - 111 mmol/L 105 105 104  CO2 22 - 32 mmol/L 23 26 26   Calcium 8.9 - 10.3 mg/dL 9.6 10.9(H) 10.0  Total Protein 6.5 - 8.1 g/dL 7.4 8.3(H) 7.2  Total Bilirubin 0.3 - 1.2 mg/dL 0.4 0.4 0.2(L)  Alkaline Phos 38 - 126 U/L 112 126 120  AST 15 - 41 U/L 14(L) 17 15    ALT 0 - 44 U/L 9 11 14       RADIOGRAPHIC STUDIES: I have personally reviewed the radiological images as listed and agreed with the findings in the report. No results found.   ASSESSMENT & PLAN: This is a very pleasant  67 year old African-American female with metastatic poorly differentiated carcinoma highly suspicious for primary lung cancer versus head and neck cancer. She is status post palliative radiotherapy to the large right neck mass. The patient initially received systemic chemotherapy with carboplatin, paclitaxel and Keytruda status post4cycles and starting with cycle 5 was started on maintenance Keytruda.she is s/p 6 cycles of therapy. she is tolerating treatment well with mild diarrhea. She will continue imodium PRN. Continue wound care for right neck wound.   She underwent restaging CT 8/16. The patient was seen with Dr. Julien Nordmann today who reviewed images independently and with the patient. He does not feel there is significant progression and recommends to continue Keytruda q3 weeks. Will restage after 3 additional cycles. Labs reviewed, proceed with cycle 7 today. She plans to go on vacation around her treatment dates. She will return in 3 weeks for follow up and next cycle.   All questions were answered. The patient knows to call the clinic with any problems, questions or concerns. No barriers to learning was detected.     Alla Feeling, NP 07/21/18    ADDENDUM: Hematology/Oncology Attending: I had a face-to-face encounter with the patient today.  I recommended her care plan.  This is a very pleasant 67 years old African-American female with metastatic poorly differentiated carcinoma status post palliative radiotherapy to the large neck mass followed by 4 cycles of systemic chemotherapy with carboplatin, paclitaxel and Keytruda.  The patient is currently undergoing maintenance treatment with single agent Ketruda (pembrolizumab) status post 2 more cycles. She has been  tolerating this treatment well with no concerning complaints except for fatigue and mild diarrhea. She had repeat CT scan of the neck and chest performed recently.  I personally and independently reviewed her scan images and discussed the results with the patient today.  Her scan showed mild disease progression in the lung. I recommended for the patient to continue her current treatment with [facility] for 3 more cycles before repeating imaging studies.  If the upcoming scan showed any concerning findings for further disease progression, we will discontinue her immunotherapy and consider the patient for other treatment options. She will come back for follow-up visit in 3 weeks for evaluation before starting the next cycle of her treatment. She was advised to call immediately if she has any concerning symptoms in the interval.  Disclaimer: This note was dictated with voice recognition software. Similar sounding words can inadvertently be transcribed and may be missed upon review. Eilleen Kempf, MD 07/21/18

## 2018-07-23 ENCOUNTER — Encounter (HOSPITAL_BASED_OUTPATIENT_CLINIC_OR_DEPARTMENT_OTHER): Payer: Medicare Other | Attending: Internal Medicine

## 2018-07-23 DIAGNOSIS — T8131XA Disruption of external operation (surgical) wound, not elsewhere classified, initial encounter: Secondary | ICD-10-CM | POA: Diagnosis not present

## 2018-07-23 DIAGNOSIS — Z923 Personal history of irradiation: Secondary | ICD-10-CM | POA: Insufficient documentation

## 2018-07-23 DIAGNOSIS — Y838 Other surgical procedures as the cause of abnormal reaction of the patient, or of later complication, without mention of misadventure at the time of the procedure: Secondary | ICD-10-CM | POA: Insufficient documentation

## 2018-07-23 DIAGNOSIS — I10 Essential (primary) hypertension: Secondary | ICD-10-CM | POA: Diagnosis not present

## 2018-07-23 DIAGNOSIS — C3490 Malignant neoplasm of unspecified part of unspecified bronchus or lung: Secondary | ICD-10-CM | POA: Diagnosis not present

## 2018-08-04 ENCOUNTER — Ambulatory Visit: Payer: Medicare Other | Admitting: Nurse Practitioner

## 2018-08-04 ENCOUNTER — Other Ambulatory Visit: Payer: Medicare Other

## 2018-08-04 ENCOUNTER — Ambulatory Visit: Payer: Medicare Other

## 2018-08-05 ENCOUNTER — Encounter: Payer: Self-pay | Admitting: Nutrition

## 2018-08-05 NOTE — Progress Notes (Unsigned)
Provided one complimentary case of Ensure Enlive.

## 2018-08-11 ENCOUNTER — Inpatient Hospital Stay (HOSPITAL_BASED_OUTPATIENT_CLINIC_OR_DEPARTMENT_OTHER): Payer: Medicare Other | Admitting: Nurse Practitioner

## 2018-08-11 ENCOUNTER — Inpatient Hospital Stay: Payer: Medicare Other | Attending: Internal Medicine

## 2018-08-11 ENCOUNTER — Telehealth: Payer: Self-pay | Admitting: Nurse Practitioner

## 2018-08-11 ENCOUNTER — Inpatient Hospital Stay: Payer: Medicare Other | Admitting: Nutrition

## 2018-08-11 ENCOUNTER — Encounter: Payer: Self-pay | Admitting: Nurse Practitioner

## 2018-08-11 ENCOUNTER — Inpatient Hospital Stay: Payer: Medicare Other

## 2018-08-11 VITALS — BP 147/63 | HR 74 | Temp 98.3°F | Resp 17 | Ht 61.0 in | Wt 118.8 lb

## 2018-08-11 DIAGNOSIS — C3491 Malignant neoplasm of unspecified part of right bronchus or lung: Secondary | ICD-10-CM

## 2018-08-11 DIAGNOSIS — Z5112 Encounter for antineoplastic immunotherapy: Secondary | ICD-10-CM | POA: Diagnosis present

## 2018-08-11 DIAGNOSIS — C7989 Secondary malignant neoplasm of other specified sites: Secondary | ICD-10-CM | POA: Diagnosis not present

## 2018-08-11 DIAGNOSIS — Z95828 Presence of other vascular implants and grafts: Secondary | ICD-10-CM

## 2018-08-11 DIAGNOSIS — R5382 Chronic fatigue, unspecified: Secondary | ICD-10-CM

## 2018-08-11 LAB — CBC WITH DIFFERENTIAL (CANCER CENTER ONLY)
BASOS ABS: 0 10*3/uL (ref 0.0–0.1)
Basophils Relative: 1 %
EOS ABS: 0 10*3/uL (ref 0.0–0.5)
EOS PCT: 1 %
HCT: 37.7 % (ref 34.8–46.6)
Hemoglobin: 12.6 g/dL (ref 11.6–15.9)
Lymphocytes Relative: 35 %
Lymphs Abs: 2.3 10*3/uL (ref 0.9–3.3)
MCH: 28.8 pg (ref 25.1–34.0)
MCHC: 33.4 g/dL (ref 31.5–36.0)
MCV: 86.2 fL (ref 79.5–101.0)
Monocytes Absolute: 0.6 10*3/uL (ref 0.1–0.9)
Monocytes Relative: 9 %
Neutro Abs: 3.6 10*3/uL (ref 1.5–6.5)
Neutrophils Relative %: 54 %
PLATELETS: 162 10*3/uL (ref 145–400)
RBC: 4.38 MIL/uL (ref 3.70–5.45)
RDW: 15.1 % — ABNORMAL HIGH (ref 11.2–14.5)
WBC: 6.6 10*3/uL (ref 3.9–10.3)

## 2018-08-11 LAB — CMP (CANCER CENTER ONLY)
ALT: 8 U/L (ref 0–44)
AST: 12 U/L — ABNORMAL LOW (ref 15–41)
Albumin: 3.4 g/dL — ABNORMAL LOW (ref 3.5–5.0)
Alkaline Phosphatase: 94 U/L (ref 38–126)
Anion gap: 9 (ref 5–15)
BUN: 18 mg/dL (ref 8–23)
CO2: 24 mmol/L (ref 22–32)
Calcium: 9.8 mg/dL (ref 8.9–10.3)
Chloride: 106 mmol/L (ref 98–111)
Creatinine: 0.66 mg/dL (ref 0.44–1.00)
GFR, Est AFR Am: >60 mL/min
GFR, Estimated: >60 mL/min
Glucose, Bld: 130 mg/dL — ABNORMAL HIGH (ref 70–99)
Potassium: 3.8 mmol/L (ref 3.5–5.1)
Sodium: 139 mmol/L (ref 135–145)
Total Bilirubin: 0.6 mg/dL (ref 0.3–1.2)
Total Protein: 6.9 g/dL (ref 6.5–8.1)

## 2018-08-11 LAB — TSH: TSH: 1.358 u[IU]/mL (ref 0.308–3.960)

## 2018-08-11 MED ORDER — SODIUM CHLORIDE 0.9% FLUSH
10.0000 mL | INTRAVENOUS | Status: DC | PRN
  Administered 2018-08-11: 10 mL
  Filled 2018-08-11: qty 10

## 2018-08-11 MED ORDER — HEPARIN SOD (PORK) LOCK FLUSH 100 UNIT/ML IV SOLN
500.0000 [IU] | Freq: Once | INTRAVENOUS | Status: AC | PRN
  Administered 2018-08-11: 500 [IU]
  Filled 2018-08-11: qty 5

## 2018-08-11 MED ORDER — SODIUM CHLORIDE 0.9% FLUSH
10.0000 mL | Freq: Once | INTRAVENOUS | Status: AC
  Administered 2018-08-11: 10 mL
  Filled 2018-08-11: qty 10

## 2018-08-11 MED ORDER — SODIUM CHLORIDE 0.9 % IV SOLN
200.0000 mg | Freq: Once | INTRAVENOUS | Status: AC
  Administered 2018-08-11: 200 mg via INTRAVENOUS
  Filled 2018-08-11: qty 8

## 2018-08-11 MED ORDER — SODIUM CHLORIDE 0.9 % IV SOLN
Freq: Once | INTRAVENOUS | Status: AC
  Administered 2018-08-11: 15:00:00 via INTRAVENOUS
  Filled 2018-08-11: qty 250

## 2018-08-11 NOTE — Telephone Encounter (Signed)
No 9/11 los 

## 2018-08-11 NOTE — Patient Instructions (Signed)
[facility] Discharge Instructions for Patients Receiving Chemotherapy  Today you received the following chemotherapy agents :  Keytruda.  To help prevent nausea and vomiting after your treatment, we encourage you to take your nausea medication as prescribed.   If you develop nausea and vomiting that is not controlled by your nausea medication, call the clinic.   BELOW ARE SYMPTOMS THAT SHOULD BE REPORTED IMMEDIATELY:  *FEVER GREATER THAN 100.5 F  *CHILLS WITH OR WITHOUT FEVER  NAUSEA AND VOMITING THAT IS NOT CONTROLLED WITH YOUR NAUSEA MEDICATION  *UNUSUAL SHORTNESS OF BREATH  *UNUSUAL BRUISING OR BLEEDING  TENDERNESS IN MOUTH AND THROAT WITH OR WITHOUT PRESENCE OF ULCERS  *URINARY PROBLEMS  *BOWEL PROBLEMS  UNUSUAL RASH Items with * indicate a potential emergency and should be followed up as soon as possible.  Feel free to call the clinic should you have any questions or concerns. The clinic phone number is [phone].  Please show the CHEMO ALERT CARD at check-in to the Emergency Department and triage nurse.  

## 2018-08-11 NOTE — Progress Notes (Signed)
Nutrition follow-up completed with patient during infusion for lung cancer. Patient reports she is eating much better. She is drinking 4 Ensure Enlive daily. She is requesting coupons and her case of Ensure Enlive. Reports she continues to attend wound clinic and her wound is healing. Weight has increased to 118 pounds on September 11 from 116.6 pounds August 21.  Nutrition diagnosis: Severe malnutrition improved.  Intervention: Patient educated to continue strategies for increased calories and protein Encourage patient to continue Ensure Enlive at least 4 daily along with increased calories and protein with p.o. intake. Assisted patient with 1 complementary case of Ensure Enlive as well as coupons. Questions were answered.  Teach back method used.  Monitoring, evaluation, goals: Patient will continue to tolerate oral intake for wound healing and weight gain/maintenance.  Next visit: Thursday, October 3 during infusion.  **Disclaimer: This note was dictated with voice recognition software. Similar sounding words can inadvertently be transcribed and this note may contain transcription errors which may not have been corrected upon publication of note.**

## 2018-08-11 NOTE — Progress Notes (Signed)
  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 She presented with large central necrotic right lower neck mass in addition to bulky mediastinal and bilateral hilar adenopathy as well as a small hypermetabolic lung lesion diagnosed in February 2019.  PRIOR THERAPY: Palliative radiotherapy to the large necrotic mass of the right neck under the care of Dr. Isidore Moos, completed on February 19, 2018.  CURRENT THERAPY: Palliative systemic chemotherapy with carboplatin for AUC of 5, paclitaxel 175 mg/M2 and Keytruda 200 mg IV every 3 weeks. First dose 03/03/2018.Beginning with cycle #5 she is receiving Keytruda only.    INTERVAL HISTORY:   Faith [name] returns as scheduled.  She completed cycle 7 Pembrolizumab 07/21/2018.  She has occasional nausea.  She notes a periodic "rash" in her mouth after treatment.  Bowels alternate constipation and diarrhea.  She denies shortness of breath and cough.  No fever.  She reports chronic right arm/hand pain.  She would like to resume physical therapy.  Objective:  Vital signs in last 24 hours:  Blood pressure (!) 147/63, pulse 74, temperature 98.3 F (36.8 C), temperature source Oral, resp. rate 17, height 5\' 1"  (1.549 m), weight 118 lb 12.8 oz (53.9 kg), SpO2 99 %.    HEENT: No thrush or ulcers.  Wound right low neck.  Wound edges appear clean. Resp: Lungs clear bilaterally. Cardio: Regular rate and rhythm. GI: Abdomen soft and nontender.  No hepatomegaly. Vascular: No leg edema. Neuro: Alert and oriented. Skin: No rash. Port-A-Cath without erythema.   Lab Results:  Lab Results  Component Value Date   WBC 6.6 08/11/2018   HGB 12.6 08/11/2018   HCT 37.7 08/11/2018   MCV 86.2 08/11/2018   PLT 162 08/11/2018   NEUTROABS 3.6 08/11/2018    Imaging:  No results found.  Medications: I have reviewed the patient's current  medications.  Assessment/Plan: 1. Metastatic poorly differentiated carcinoma highly suspicious for primary lung cancer versus head neck cancer.  Status post palliative radiation to a large right neck mass.  Initially received systemic chemotherapy with carboplatin, paclitaxel and Pembrolizumab x4 cycles and began single agent maintenance Pembrolizumab with cycle 5.  Disposition: Faith [name] appears stable.  She completed 4 cycles of carboplatin/Taxol/Pembrolizumab.  She began single agent maintenance Pembrolizumab with cycle 5.  To date she has completed 7 cycles of Pembrolizumab.  Plan to proceed with cycle 8 today as scheduled.  The plan is for restaging CTs after cycle 9.  She will return for lab, follow-up and cycle 9 Pembrolizumab in 3 weeks.  She will contact the office in the interim with any problems.  Plan reviewed with Dr. Julien Nordmann.    Ned Card ANP/GNP-BC   08/11/2018  2:04 PM

## 2018-08-16 ENCOUNTER — Telehealth: Payer: Self-pay | Admitting: Medical Oncology

## 2018-08-16 NOTE — Telephone Encounter (Signed)
Pt said at her last visit with Julien Nordmann he told her she needed PT . [facility] is calling to request orders for PT/OT and social work evaluation and treat.

## 2018-08-16 NOTE — Telephone Encounter (Signed)
ok 

## 2018-08-17 NOTE — Telephone Encounter (Signed)
Called to [facility]

## 2018-08-23 ENCOUNTER — Encounter (HOSPITAL_BASED_OUTPATIENT_CLINIC_OR_DEPARTMENT_OTHER): Payer: Medicare Other | Attending: Internal Medicine

## 2018-08-23 DIAGNOSIS — Z923 Personal history of irradiation: Secondary | ICD-10-CM | POA: Diagnosis not present

## 2018-08-23 DIAGNOSIS — Z9221 Personal history of antineoplastic chemotherapy: Secondary | ICD-10-CM | POA: Insufficient documentation

## 2018-08-23 DIAGNOSIS — T8131XA Disruption of external operation (surgical) wound, not elsewhere classified, initial encounter: Secondary | ICD-10-CM | POA: Diagnosis present

## 2018-08-23 DIAGNOSIS — I10 Essential (primary) hypertension: Secondary | ICD-10-CM | POA: Diagnosis not present

## 2018-08-23 DIAGNOSIS — Y838 Other surgical procedures as the cause of abnormal reaction of the patient, or of later complication, without mention of misadventure at the time of the procedure: Secondary | ICD-10-CM | POA: Insufficient documentation

## 2018-09-01 ENCOUNTER — Other Ambulatory Visit: Payer: Medicare Other

## 2018-09-01 ENCOUNTER — Ambulatory Visit: Payer: Medicare Other

## 2018-09-01 ENCOUNTER — Ambulatory Visit: Payer: Medicare Other | Admitting: Oncology

## 2018-09-02 ENCOUNTER — Inpatient Hospital Stay: Payer: Medicare Other | Attending: Internal Medicine

## 2018-09-02 ENCOUNTER — Inpatient Hospital Stay: Payer: Medicare Other

## 2018-09-02 ENCOUNTER — Telehealth: Payer: Self-pay | Admitting: Oncology

## 2018-09-02 ENCOUNTER — Inpatient Hospital Stay (HOSPITAL_BASED_OUTPATIENT_CLINIC_OR_DEPARTMENT_OTHER): Payer: Medicare Other | Admitting: Oncology

## 2018-09-02 ENCOUNTER — Inpatient Hospital Stay: Payer: Medicare Other | Admitting: Nutrition

## 2018-09-02 ENCOUNTER — Encounter: Payer: Self-pay | Admitting: Oncology

## 2018-09-02 VITALS — BP 136/76 | HR 82 | Temp 98.2°F | Resp 20 | Ht 61.0 in | Wt 119.4 lb

## 2018-09-02 DIAGNOSIS — C3491 Malignant neoplasm of unspecified part of right bronchus or lung: Secondary | ICD-10-CM

## 2018-09-02 DIAGNOSIS — Z79899 Other long term (current) drug therapy: Secondary | ICD-10-CM | POA: Diagnosis not present

## 2018-09-02 DIAGNOSIS — R5382 Chronic fatigue, unspecified: Secondary | ICD-10-CM

## 2018-09-02 DIAGNOSIS — K5900 Constipation, unspecified: Secondary | ICD-10-CM

## 2018-09-02 DIAGNOSIS — Z5112 Encounter for antineoplastic immunotherapy: Secondary | ICD-10-CM | POA: Insufficient documentation

## 2018-09-02 DIAGNOSIS — I10 Essential (primary) hypertension: Secondary | ICD-10-CM

## 2018-09-02 DIAGNOSIS — C7989 Secondary malignant neoplasm of other specified sites: Secondary | ICD-10-CM | POA: Diagnosis not present

## 2018-09-02 DIAGNOSIS — R197 Diarrhea, unspecified: Secondary | ICD-10-CM

## 2018-09-02 DIAGNOSIS — Z95828 Presence of other vascular implants and grafts: Secondary | ICD-10-CM

## 2018-09-02 LAB — CBC WITH DIFFERENTIAL (CANCER CENTER ONLY)
BASOS ABS: 0 10*3/uL (ref 0.0–0.1)
BASOS PCT: 0 %
Eosinophils Absolute: 0 10*3/uL (ref 0.0–0.5)
Eosinophils Relative: 1 %
HEMATOCRIT: 40.8 % (ref 34.8–46.6)
HEMOGLOBIN: 13.7 g/dL (ref 11.6–15.9)
Lymphocytes Relative: 18 %
Lymphs Abs: 1.4 10*3/uL (ref 0.9–3.3)
MCH: 29 pg (ref 25.1–34.0)
MCHC: 33.6 g/dL (ref 31.5–36.0)
MCV: 86.4 fL (ref 79.5–101.0)
Monocytes Absolute: 0.6 10*3/uL (ref 0.1–0.9)
Monocytes Relative: 8 %
NEUTROS ABS: 5.4 10*3/uL (ref 1.5–6.5)
Neutrophils Relative %: 73 %
Platelet Count: 186 10*3/uL (ref 145–400)
RBC: 4.72 MIL/uL (ref 3.70–5.45)
RDW: 15 % — AB (ref 11.2–14.5)
WBC: 7.4 10*3/uL (ref 3.9–10.3)

## 2018-09-02 LAB — CMP (CANCER CENTER ONLY)
ALK PHOS: 97 U/L (ref 38–126)
ALT: 9 U/L (ref 0–44)
ANION GAP: 8 (ref 5–15)
AST: 12 U/L — ABNORMAL LOW (ref 15–41)
Albumin: 3.6 g/dL (ref 3.5–5.0)
BILIRUBIN TOTAL: 0.7 mg/dL (ref 0.3–1.2)
BUN: 14 mg/dL (ref 8–23)
CO2: 25 mmol/L (ref 22–32)
Calcium: 10.3 mg/dL (ref 8.9–10.3)
Chloride: 107 mmol/L (ref 98–111)
Creatinine: 0.73 mg/dL (ref 0.44–1.00)
GFR, Estimated: >60 mL/min (ref >60)
Glucose, Bld: 242 mg/dL — ABNORMAL HIGH (ref 70–99)
Potassium: 3.4 mmol/L — ABNORMAL LOW (ref 3.5–5.1)
SODIUM: 140 mmol/L (ref 135–145)
TOTAL PROTEIN: 7.6 g/dL (ref 6.5–8.1)

## 2018-09-02 LAB — TSH: TSH: 1.199 u[IU]/mL (ref 0.308–3.960)

## 2018-09-02 MED ORDER — SODIUM CHLORIDE 0.9 % IV SOLN
200.0000 mg | Freq: Once | INTRAVENOUS | Status: AC
  Administered 2018-09-02: 200 mg via INTRAVENOUS
  Filled 2018-09-02: qty 8

## 2018-09-02 MED ORDER — SODIUM CHLORIDE 0.9 % IV SOLN
Freq: Once | INTRAVENOUS | Status: AC
  Administered 2018-09-02: 16:00:00 via INTRAVENOUS
  Filled 2018-09-02: qty 250

## 2018-09-02 MED ORDER — HEPARIN SOD (PORK) LOCK FLUSH 100 UNIT/ML IV SOLN
500.0000 [IU] | Freq: Once | INTRAVENOUS | Status: AC | PRN
  Administered 2018-09-02: 500 [IU]
  Filled 2018-09-02: qty 5

## 2018-09-02 MED ORDER — SODIUM CHLORIDE 0.9% FLUSH
10.0000 mL | INTRAVENOUS | Status: DC | PRN
  Administered 2018-09-02: 10 mL
  Filled 2018-09-02: qty 10

## 2018-09-02 MED ORDER — SODIUM CHLORIDE 0.9% FLUSH
10.0000 mL | Freq: Once | INTRAVENOUS | Status: AC
  Administered 2018-09-02: 10 mL
  Filled 2018-09-02: qty 10

## 2018-09-02 NOTE — Telephone Encounter (Signed)
Gave pt avs and calendar  °

## 2018-09-02 NOTE — Assessment & Plan Note (Addendum)
This is a very pleasant 67 year old African-American female with metastatic poorly differentiated carcinoma highly suspicious for primary lung cancer versus head and neck cancer. She is status post palliative radiotherapy to the large right neck mass. The patient initially received systemic chemotherapy with carboplatin, paclitaxel and Keytruda status post4cycles and starting with cycle 5 was started on maintenance Keytruda.She is status post 8 total cycles of her treatment. The patient continues to tolerate this treatment well with no concerning complaints except for intermittent diarrhea.    Recommend for her to proceed with cycle #9 of her treatment today as scheduled.  She will have a restaging CT scan of the neck, chest, abdomen, pelvis prior to her next visit.  We will follow-up in 3 weeks for evaluation prior to her next cycle of treatment and to review her restaging CT scan results.  For pain management she will continue with the current pain medication oxycodone.  For diarrhea, she will continue Imodium as needed.  She was advised to call immediately if she has any concerning symptoms in the interval. The patient voices understanding of current disease status and treatment options and is in agreement with the current care plan.  All questions were answered. The patient knows to call the clinic with any problems, questions or concerns. We can certainly see the patient much sooner if necessary.

## 2018-09-02 NOTE — Progress Notes (Signed)
Nutrition follow-up completed with patient during infusion for lung cancer. Patient continues to eat food and drink 3-4 bottles of Ensure Enlive daily. She is requesting coupons and samples. Weight increased slightly was documented as 119.4 pounds Patient specifically denies nausea and vomiting. She has diarrhea for which she takes Imodium. Reports oral intake is causing stomach distress and her physician is sending her for an x-ray.  Nutrition diagnosis: Severe malnutrition improved.  Intervention: Patient educated to continue strategies for adequate calories and protein as tolerated. Provided samples and coupons. Teach back method used.  Monitoring, evaluation, goals: Patient will tolerate increased oral intake to minimize weight loss.  Next visit: Thursday, November 14 during infusion.  **Disclaimer: This note was dictated with voice recognition software. Similar sounding words can inadvertently be transcribed and this note may contain transcription errors which may not have been corrected upon publication of note.**

## 2018-09-02 NOTE — Progress Notes (Signed)
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 She presented with large central necrotic right lower neck mass in addition to bulky mediastinal and bilateral hilar adenopathy as well as a small hypermetabolic lung lesion diagnosed in February 2019.  PRIOR THERAPY: Palliative radiotherapy to the large necrotic mass of the right neck under the care of Dr. Isidore Moos, completed on February 19, 2018.  CURRENT THERAPY: Palliative systemic chemotherapy with carboplatin for AUC of 5, paclitaxel 175 mg/M2 and Keytruda 200 mg IV every 3 weeks. First dose 03/03/2018.Beginning with cycle #5 she is receiving Keytruda only.  Status post 8 total cycles.  INTERVAL HISTORY: Faith [name] 67 y.o. female returns for routine follow-up visit by herself.  The patient is feeling fine today and has no specific complaints.  She denies fevers and chills.  Denies chest pain, shortness of breath, cough, hemoptysis.  She denies nausea and vomiting.  She has intermittent constipation alternating with diarrhea which is unchanged.  Denies recent weight loss or night sweats.  The patient is here for evaluation prior to cycle #9 of her treatment.  MEDICAL HISTORY: Past Medical History:  Diagnosis Date  . Anxiety   . Arthritis    "thighs; legs; hips" (07/05/2014)  . Bipolar disorder ([place])   . Cholelithiasis 07/2014  . Chronic bronchitis ([place])    "get it q yr"  . Chronic lower back pain   . Depression    pt. use to go to depression clinic, states she still has depression & anxiety but can't get to appt. so she hasn't had any med. for it in a while   . Dyspnea   . GERD (gastroesophageal reflux disease)   . High cholesterol   . History of radiation therapy 02/01/18- 02/12/18   Right Neck/ 30 Gy in 10 fractions.   . Hypertension   . Mass in neck 12/2017   RIGHT  SIDE OF NECK  . Peripheral vascular disease ([place])   . Schizophrenia ([place])   . Stroke [facility])     ALLERGIES:  is allergic to penicillins; codeine; and percocet [oxycodone-acetaminophen].  MEDICATIONS:  Current Outpatient Medications  Medication Sig Dispense Refill  . acetaminophen (TYLENOL) 325 MG tablet Take 2 tablets (650 mg total) by mouth every 6 (six) hours as needed for mild pain (or Fever >/= 101).    Marland Kitchen albuterol (PROVENTIL HFA;VENTOLIN HFA) 108 (90 Base) MCG/ACT inhaler Inhale 1-2 puffs into the lungs every 6 (six) hours as needed for wheezing or shortness of breath.    Marland Kitchen albuterol (PROVENTIL) (2.5 MG/3ML) 0.083% nebulizer solution Take 2.5 mg by nebulization every 6 (six) hours as needed for wheezing or shortness of breath.    . AMITIZA 24 MCG capsule Take 24 mcg by mouth 2 (two) times daily.  0  . clopidogrel (PLAVIX) 75 MG tablet Take 1 tablet (75 mg total) by mouth daily. 30 tablet 0  . diclofenac sodium (VOLTAREN) 1 % GEL Apply 2 g topically daily as needed (pain).     Marland Kitchen diphenhydrAMINE (BENADRYL) 25 MG tablet Take 2 tablets (50 mg total) by mouth at bedtime as needed for sleep. 20 tablet 0  . fluticasone (FLONASE) 50 MCG/ACT nasal spray Place 2 sprays into both nostrils daily.   0  . furosemide (LASIX) 20 MG tablet Take 20 mg by mouth daily as needed for fluid or edema.     Marland Kitchen  gabapentin (NEURONTIN) 300 MG capsule Take 300 mg by mouth 3 (three) times daily.     Marland Kitchen ibuprofen (ADVIL,MOTRIN) 800 MG tablet Take 3 tablets by mouth 3 (three) times daily.    Marland Kitchen lidocaine (XYLOCAINE) 2 % solution Caregiver: Mix 1part 2% viscous lidocaine, 1part H20. Swish & swallow 25mL of diluted mixture, 37min before meals and at bedtime, up to QID 100 mL 5  . lidocaine-prilocaine (EMLA) cream Apply 1 application topically as needed. 30 g 1  . Linaclotide (LINZESS) 290 MCG CAPS capsule Take 290-580 mcg by mouth daily as needed (constipation).     Marland Kitchen loratadine (CLARITIN) 10 MG tablet Take 10 mg by mouth  daily.    . methocarbamol (ROBAXIN) 750 MG tablet Take 750 mg by mouth 2 (two) times daily as needed for muscle spasms.     . mometasone (ELOCON) 0.1 % ointment Apply 1 application topically daily as needed (irritation).     . nicotine polacrilex (NICORETTE) 2 MG gum Take 2 mg by mouth as needed for smoking cessation.    Marland Kitchen omeprazole (PRILOSEC) 20 MG capsule Take 20 mg by mouth 2 (two) times daily before a meal.    . Oxycodone HCl 20 MG TABS Take 1 tablet by mouth 4 (four) times daily as needed.  0  . oxyCODONE-acetaminophen (PERCOCET) 10-325 MG tablet Take 1 tablet by mouth every 6 (six) hours as needed for pain. 30 tablet 0  . polyethylene glycol (MIRALAX / GLYCOLAX) packet Take 17 g by mouth daily. 14 each 0  . potassium chloride SA (K-DUR,KLOR-CON) 20 MEQ tablet TAKE 2 TABLETS AS A SINGLE DOSE 20 tablet 0  . prochlorperazine (COMPAZINE) 10 MG tablet Take 1 tablet (10 mg total) by mouth every 6 (six) hours as needed for nausea or vomiting. 30 tablet 0  . SANTYL ointment APPLY  AA QD  1  . Vitamin D, Ergocalciferol, (DRISDOL) 50000 units CAPS capsule Take 50,000 Units by mouth every 7 (seven) days.     No current facility-administered medications for this visit.     SURGICAL HISTORY:  Past Surgical History:  Procedure Laterality Date  . ABDOMINAL AORTOGRAM N/A 01/07/2017   Procedure: Abdominal Aortogram;  Surgeon: Waynetta Sandy, MD;  Location: [facility];  Service: Cardiovascular;  Laterality: N/A;  . ABDOMINAL HYSTERECTOMY    . ANKLE FRACTURE SURGERY Right   . ANKLE HARDWARE REMOVAL Right   . APPENDECTOMY  ~ 1963  . BREAST BIOPSY Bilateral   . BREAST CYST EXCISION Bilateral    "not cancer"  . CATARACT EXTRACTION W/ INTRAOCULAR LENS  IMPLANT, BILATERAL    . CHOLECYSTECTOMY N/A 07/05/2014   Procedure: LAPAROSCOPIC CHOLECYSTECTOMY WITH INTRAOPERATIVE CHOLANGIOGRAM;  Surgeon: Gayland Curry, MD;  Location: [facility];  Service: General;  Laterality: N/A;  . EXCISIONAL  HEMORRHOIDECTOMY    . FEMORAL-POPLITEAL BYPASS GRAFT Right 01/15/2017   Procedure: BYPASS GRAFT FEMORAL-POPLITEAL ARTERY RIGHT LEG;  Surgeon: Waynetta Sandy, MD;  Location: [facility];  Service: Vascular;  Laterality: Right;  . IR IMAGING GUIDED PORT INSERTION  03/08/2018  . IR US GUIDE VASC ACCESS LEFT  03/08/2018  . LAPAROSCOPIC CHOLECYSTECTOMY  07/05/2014  . LOWER EXTREMITY ANGIOGRAPHY Bilateral 01/07/2017   Procedure: Lower Extremity Angiography;  Surgeon: Waynetta Sandy, MD;  Location: [facility];  Service: Cardiovascular;  Laterality: Bilateral;  . MASS BIOPSY Right 12/25/2017   Procedure: INCISIONAL RIGHT NECK MASS BIOPSY;  Surgeon: Helayne Seminole, MD;  Location: [facility];  Service: ENT;  Laterality:  Right;  Marland Kitchen MULTIPLE TOOTH EXTRACTIONS  05/2013   "pulled my upper teeth"  . PILONIDAL CYST EXCISION      REVIEW OF SYSTEMS:   Review of Systems  Constitutional: Negative for appetite change, chills, fatigue, fever and unexpected weight change.  HENT:   Negative for mouth sores, nosebleeds, sore throat and trouble swallowing.  Reports that right neck mass is getting smaller. Eyes: Negative for eye problems and icterus.  Respiratory: Negative for cough, hemoptysis, shortness of breath and wheezing.   Cardiovascular: Negative for chest pain and leg swelling.  Gastrointestinal: Negative for abdominal pain, nausea and vomiting.  Positive for alternating constipation and diarrhea. Genitourinary: Negative for bladder incontinence, difficulty urinating, dysuria, frequency and hematuria.   Musculoskeletal: Negative for back pain, gait problem, neck pain and neck stiffness.  Skin: Negative for itching and rash.  Neurological: Negative for dizziness, extremity weakness, gait problem, headaches, light-headedness and seizures.  Hematological: Negative for adenopathy. Does not bruise/bleed easily.  Psychiatric/Behavioral: Negative for confusion, depression and sleep disturbance. The  patient is not nervous/anxious.     PHYSICAL EXAMINATION:  Blood pressure 136/76, pulse 82, temperature 98.2 F (36.8 C), temperature source Oral, resp. rate 20, height 5\' 1"  (1.549 m), weight 119 lb 6.4 oz (54.2 kg), SpO2 92 %.  ECOG PERFORMANCE STATUS: 1 - Symptomatic but completely ambulatory  Physical Exam  Constitutional: Oriented to person, place, and time and well-developed, well-nourished, and in no distress. No distress.  HENT:  Head: Normocephalic and atraumatic.  Mouth/Throat: Oropharynx is clear and moist. No oropharyngeal exudate.  Eyes: Conjunctivae are normal. Right eye exhibits no discharge. Left eye exhibits no discharge. No scleral icterus.  Neck: Normal range of motion. Neck supple.  Bandage to right neck. Cardiovascular: Normal rate, regular rhythm, normal heart sounds and intact distal pulses.   Pulmonary/Chest: Effort normal and breath sounds normal. No respiratory distress. No wheezes. No rales.  Abdominal: Soft. Bowel sounds are normal. Exhibits no distension and no mass. There is no tenderness.  Musculoskeletal: Normal range of motion. Exhibits no edema.  Lymphadenopathy:    No cervical adenopathy.  Neurological: Alert and oriented to person, place, and time. Exhibits normal muscle tone. Gait normal. Coordination normal.  Skin: Skin is warm and dry. No rash noted. Not diaphoretic. No erythema. No pallor.  Psychiatric: Mood, memory and judgment normal.  Vitals reviewed.  LABORATORY DATA: Lab Results  Component Value Date   WBC 7.4 09/02/2018   HGB 13.7 09/02/2018   HCT 40.8 09/02/2018   MCV 86.4 09/02/2018   PLT 186 09/02/2018      Chemistry      Component Value Date/Time   NA 140 09/02/2018 1311   K 3.4 (L) 09/02/2018 1311   CL 107 09/02/2018 1311   CO2 25 09/02/2018 1311   BUN 14 09/02/2018 1311   CREATININE 0.73 09/02/2018 1311      Component Value Date/Time   CALCIUM 10.3 09/02/2018 1311   ALKPHOS 97 09/02/2018 1311   AST 12 (L) 09/02/2018  1311   ALT 9 09/02/2018 1311   BILITOT 0.7 09/02/2018 1311       RADIOGRAPHIC STUDIES:  No results found.   ASSESSMENT/PLAN:  Non-small cell carcinoma of right lung, stage 4 (HCC) This is a very pleasant 67 year old African-American female with metastatic poorly differentiated carcinoma highly suspicious for primary lung cancer versus head and neck cancer. She is status post palliative radiotherapy to the large right neck mass. The patient initially received systemic chemotherapy with carboplatin, paclitaxel  and Keytruda status post4cycles and starting with cycle 5 was started on maintenance Keytruda.She is status post 8 total cycles of her treatment. The patient continues to tolerate this treatment well with no concerning complaints except for intermittent diarrhea.    Recommend for her to proceed with cycle #9 of her treatment today as scheduled.  She will have a restaging CT scan of the neck, chest, abdomen, pelvis prior to her next visit.  We will follow-up in 3 weeks for evaluation prior to her next cycle of treatment and to review her restaging CT scan results.  For pain management she will continue with the current pain medication oxycodone.  For diarrhea, she will continue Imodium as needed.  She was advised to call immediately if she has any concerning symptoms in the interval. The patient voices understanding of current disease status and treatment options and is in agreement with the current care plan.  All questions were answered. The patient knows to call the clinic with any problems, questions or concerns. We can certainly see the patient much sooner if necessary.   Orders Placed This Encounter  Procedures  . CT ABDOMEN PELVIS W CONTRAST    Standing Status:   Future    Standing Expiration Date:   09/03/2019    Order Specific Question:   If indicated for the ordered procedure, I authorize the administration of contrast media per Radiology protocol    Answer:    Yes    Order Specific Question:   Preferred imaging location?    Answer:   [facility]    Order Specific Question:   Radiology Contrast Protocol - do NOT remove file path    Answer:   \\[email or web address]    Order Specific Question:   ** REASON FOR EXAM (FREE TEXT)    Answer:   Lung cancer with neck mass. Restaging.  . CT CHEST W CONTRAST    Standing Status:   Future    Standing Expiration Date:   09/03/2019    Order Specific Question:   If indicated for the ordered procedure, I authorize the administration of contrast media per Radiology protocol    Answer:   Yes    Order Specific Question:   Preferred imaging location?    Answer:   [facility]    Order Specific Question:   Radiology Contrast Protocol - do NOT remove file path    Answer:   \\[email or web address]    Order Specific Question:   ** REASON FOR EXAM (FREE TEXT)    Answer:   Lung cancer with neck mass. Restaging.  . CT Soft Tissue Neck W Contrast    Standing Status:   Future    Standing Expiration Date:   09/02/2019    Order Specific Question:   ** REASON FOR EXAM (FREE TEXT)    Answer:   Lung cancer with neck mass. Restaging.    Order Specific Question:   If indicated for the ordered procedure, I authorize the administration of contrast media per Radiology protocol    Answer:   Yes    Order Specific Question:   Preferred imaging location?    Answer:   [facility]    Order Specific Question:   Radiology Contrast Protocol - do NOT remove file path    Answer:   \\[email or web address]     Mikey Bussing, DNP, AGPCNP-BC, AOCNP 09/02/18

## 2018-09-02 NOTE — Patient Instructions (Signed)
Pembrolizumab injection What is this medicine? PEMBROLIZUMAB (pem broe liz ue mab) is a monoclonal antibody. It is used to treat melanoma, head and neck cancer, Hodgkin lymphoma, non-small cell lung cancer, urothelial cancer, stomach cancer, and cancers that have a certain genetic condition. This medicine may be used for other purposes; ask your health care provider or pharmacist if you have questions. COMMON BRAND NAME(S): Keytruda What should I tell my health care provider before I take this medicine? They need to know if you have any of these conditions: -diabetes -immune system problems -inflammatory bowel disease -liver disease -lung or breathing disease -lupus -organ transplant -an unusual or allergic reaction to pembrolizumab, other medicines, foods, dyes, or preservatives -pregnant or trying to get pregnant -breast-feeding How should I use this medicine? This medicine is for infusion into a vein. It is given by a health care professional in a hospital or clinic setting. A special MedGuide will be given to you before each treatment. Be sure to read this information carefully each time. Talk to your pediatrician regarding the use of this medicine in children. While this drug may be prescribed for selected conditions, precautions do apply. Overdosage: If you think you have taken too much of this medicine contact a poison control center or emergency room at once. NOTE: This medicine is only for you. Do not share this medicine with others. What if I miss a dose? It is important not to miss your dose. Call your doctor or health care professional if you are unable to keep an appointment. What may interact with this medicine? Interactions have not been studied. Give your health care provider a list of all the medicines, herbs, non-prescription drugs, or dietary supplements you use. Also tell them if you smoke, drink alcohol, or use illegal drugs. Some items may interact with your  medicine. This list may not describe all possible interactions. Give your health care provider a list of all the medicines, herbs, non-prescription drugs, or dietary supplements you use. Also tell them if you smoke, drink alcohol, or use illegal drugs. Some items may interact with your medicine. What should I watch for while using this medicine? Your condition will be monitored carefully while you are receiving this medicine. You may need blood work done while you are taking this medicine. Do not become pregnant while taking this medicine or for 4 months after stopping it. Women should inform their doctor if they wish to become pregnant or think they might be pregnant. There is a potential for serious side effects to an unborn child. Talk to your health care professional or pharmacist for more information. Do not breast-feed an infant while taking this medicine or for 4 months after the last dose. What side effects may I notice from receiving this medicine? Side effects that you should report to your doctor or health care professional as soon as possible: -allergic reactions like skin rash, itching or hives, swelling of the face, lips, or tongue -bloody or black, tarry -breathing problems -changes in vision -chest pain -chills -constipation -cough -dizziness or feeling faint or lightheaded -fast or irregular heartbeat -fever -flushing -hair loss -low blood counts - this medicine may decrease the number of white blood cells, red blood cells and platelets. You may be at increased risk for infections and bleeding. -muscle pain -muscle weakness -persistent headache -signs and symptoms of high blood sugar such as dizziness; dry mouth; dry skin; fruity breath; nausea; stomach pain; increased hunger or thirst; increased urination -signs and symptoms of kidney  injury like trouble passing urine or change in the amount of urine -signs and symptoms of liver injury like dark urine, light-colored  stools, loss of appetite, nausea, right upper belly pain, yellowing of the eyes or skin -stomach pain -sweating -weight loss Side effects that usually do not require medical attention (report to your doctor or health care professional if they continue or are bothersome): -decreased appetite -diarrhea -tiredness This list may not describe all possible side effects. Call your doctor for medical advice about side effects. You may report side effects to FDA at 1-800-FDA-1088. Where should I keep my medicine? This drug is given in a hospital or clinic and will not be stored at home. NOTE: This sheet is a summary. It may not cover all possible information. If you have questions about this medicine, talk to your doctor, pharmacist, or health care provider.  2018 Elsevier/Gold Standard (2016-08-26 12:29:36)      [facility] Discharge Instructions for Patients Receiving Chemotherapy  Today you received the following chemotherapy agents:  Keytruda  To help prevent nausea and vomiting after your treatment, we encourage you to take your nausea medication as directed.   If you develop nausea and vomiting that is not controlled by your nausea medication, call the clinic.   BELOW ARE SYMPTOMS THAT SHOULD BE REPORTED IMMEDIATELY:  *FEVER GREATER THAN 100.5 F  *CHILLS WITH OR WITHOUT FEVER  NAUSEA AND VOMITING THAT IS NOT CONTROLLED WITH YOUR NAUSEA MEDICATION  *UNUSUAL SHORTNESS OF BREATH  *UNUSUAL BRUISING OR BLEEDING  TENDERNESS IN MOUTH AND THROAT WITH OR WITHOUT PRESENCE OF ULCERS  *URINARY PROBLEMS  *BOWEL PROBLEMS  UNUSUAL RASH Items with * indicate a potential emergency and should be followed up as soon as possible.  Feel free to call the clinic should you have any questions or concerns. The clinic phone number is [phone].  Please show the [place] at check-in to the Emergency Department and triage nurse.

## 2018-09-14 ENCOUNTER — Encounter: Payer: Self-pay | Admitting: Nutrition

## 2018-09-14 NOTE — Progress Notes (Signed)
Provided one complimentary case of Ensure Enlive.

## 2018-09-21 ENCOUNTER — Ambulatory Visit (HOSPITAL_COMMUNITY)
Admission: RE | Admit: 2018-09-21 | Discharge: 2018-09-21 | Disposition: A | Discharge status: Home / Self Care | Payer: Medicare Other | Source: Ambulatory Visit | Attending: Oncology | Admitting: Oncology

## 2018-09-21 DIAGNOSIS — J432 Centrilobular emphysema: Secondary | ICD-10-CM | POA: Insufficient documentation

## 2018-09-21 DIAGNOSIS — I700 Atherosclerosis of aorta: Secondary | ICD-10-CM | POA: Diagnosis not present

## 2018-09-21 DIAGNOSIS — C3491 Malignant neoplasm of unspecified part of right bronchus or lung: Secondary | ICD-10-CM | POA: Insufficient documentation

## 2018-09-21 DIAGNOSIS — I6522 Occlusion and stenosis of left carotid artery: Secondary | ICD-10-CM | POA: Insufficient documentation

## 2018-09-21 DIAGNOSIS — J9811 Atelectasis: Secondary | ICD-10-CM | POA: Insufficient documentation

## 2018-09-21 DIAGNOSIS — J438 Other emphysema: Secondary | ICD-10-CM | POA: Diagnosis not present

## 2018-09-21 DIAGNOSIS — R918 Other nonspecific abnormal finding of lung field: Secondary | ICD-10-CM | POA: Diagnosis not present

## 2018-09-21 DIAGNOSIS — R590 Localized enlarged lymph nodes: Secondary | ICD-10-CM | POA: Insufficient documentation

## 2018-09-21 DIAGNOSIS — J90 Pleural effusion, not elsewhere classified: Secondary | ICD-10-CM | POA: Insufficient documentation

## 2018-09-21 MED ORDER — SODIUM CHLORIDE 0.9 % IJ SOLN
INTRAMUSCULAR | Status: AC
  Filled 2018-09-21: qty 50

## 2018-09-21 MED ORDER — HEPARIN SOD (PORK) LOCK FLUSH 100 UNIT/ML IV SOLN
INTRAVENOUS | Status: AC
  Filled 2018-09-21: qty 5

## 2018-09-21 MED ORDER — HEPARIN SOD (PORK) LOCK FLUSH 100 UNIT/ML IV SOLN
500.0000 [IU] | Freq: Once | INTRAVENOUS | Status: AC
  Administered 2018-09-21: 500 [IU] via INTRAVENOUS

## 2018-09-21 MED ORDER — IOHEXOL 300 MG/ML  SOLN
100.0000 mL | Freq: Once | INTRAMUSCULAR | Status: AC | PRN
  Administered 2018-09-21: 100 mL via INTRAVENOUS

## 2018-09-22 ENCOUNTER — Other Ambulatory Visit: Payer: Self-pay | Admitting: *Deleted

## 2018-09-22 MED ORDER — LIDOCAINE-PRILOCAINE 2.5-2.5 % EX CREA: 1.0000 "application " | TOPICAL_CREAM | CUTANEOUS | 3 refills | Status: AC | PRN

## 2018-09-23 ENCOUNTER — Encounter: Payer: Self-pay | Admitting: Oncology

## 2018-09-23 ENCOUNTER — Inpatient Hospital Stay (HOSPITAL_BASED_OUTPATIENT_CLINIC_OR_DEPARTMENT_OTHER): Payer: Medicare Other | Admitting: Oncology

## 2018-09-23 ENCOUNTER — Inpatient Hospital Stay: Payer: Medicare Other

## 2018-09-23 ENCOUNTER — Telehealth: Payer: Self-pay | Admitting: Oncology

## 2018-09-23 VITALS — BP 146/85 | HR 67 | Temp 98.4°F | Resp 16 | Ht 61.0 in | Wt 122.1 lb

## 2018-09-23 DIAGNOSIS — C3491 Malignant neoplasm of unspecified part of right bronchus or lung: Secondary | ICD-10-CM

## 2018-09-23 DIAGNOSIS — Z5112 Encounter for antineoplastic immunotherapy: Secondary | ICD-10-CM

## 2018-09-23 DIAGNOSIS — C7989 Secondary malignant neoplasm of other specified sites: Secondary | ICD-10-CM | POA: Diagnosis not present

## 2018-09-23 DIAGNOSIS — I10 Essential (primary) hypertension: Secondary | ICD-10-CM

## 2018-09-23 DIAGNOSIS — R5382 Chronic fatigue, unspecified: Secondary | ICD-10-CM

## 2018-09-23 DIAGNOSIS — Z95828 Presence of other vascular implants and grafts: Secondary | ICD-10-CM

## 2018-09-23 LAB — CBC WITH DIFFERENTIAL (CANCER CENTER ONLY)
Abs Immature Granulocytes: 0.01 10*3/uL (ref 0.00–0.07)
BASOS ABS: 0 10*3/uL (ref 0.0–0.1)
Basophils Relative: 0 %
Eosinophils Absolute: 0.1 10*3/uL (ref 0.0–0.5)
Eosinophils Relative: 1 %
HCT: 36.2 % (ref 36.0–46.0)
HEMOGLOBIN: 12 g/dL (ref 12.0–15.0)
Immature Granulocytes: 0 %
LYMPHS PCT: 27 %
Lymphs Abs: 1.7 10*3/uL (ref 0.7–4.0)
MCH: 28.3 pg (ref 26.0–34.0)
MCHC: 33.1 g/dL (ref 30.0–36.0)
MCV: 85.4 fL (ref 80.0–100.0)
Monocytes Absolute: 0.5 10*3/uL (ref 0.1–1.0)
Monocytes Relative: 9 %
NEUTROS ABS: 3.9 10*3/uL (ref 1.7–7.7)
Neutrophils Relative %: 63 %
Platelet Count: 167 10*3/uL (ref 150–400)
RBC: 4.24 MIL/uL (ref 3.87–5.11)
RDW: 14.4 % (ref 11.5–15.5)
WBC: 6.2 10*3/uL (ref 4.0–10.5)
nRBC: 0 % (ref 0.0–0.2)

## 2018-09-23 LAB — CMP (CANCER CENTER ONLY)
ALT: 8 U/L (ref 0–44)
ANION GAP: 8 (ref 5–15)
AST: 13 U/L — AB (ref 15–41)
Albumin: 3.4 g/dL — ABNORMAL LOW (ref 3.5–5.0)
Alkaline Phosphatase: 91 U/L (ref 38–126)
BILIRUBIN TOTAL: 0.5 mg/dL (ref 0.3–1.2)
BUN: 10 mg/dL (ref 8–23)
CHLORIDE: 107 mmol/L (ref 98–111)
CO2: 24 mmol/L (ref 22–32)
Calcium: 9.6 mg/dL (ref 8.9–10.3)
Creatinine: 0.62 mg/dL (ref 0.44–1.00)
GFR, Estimated: >60 mL/min (ref >60)
Glucose, Bld: 96 mg/dL (ref 70–99)
Potassium: 4.2 mmol/L (ref 3.5–5.1)
Sodium: 139 mmol/L (ref 135–145)
Total Protein: 6.8 g/dL (ref 6.5–8.1)

## 2018-09-23 LAB — TSH: TSH: 2.559 u[IU]/mL (ref 0.308–3.960)

## 2018-09-23 MED ORDER — SODIUM CHLORIDE 0.9% FLUSH
10.0000 mL | Freq: Once | INTRAVENOUS | Status: AC
  Administered 2018-09-23: 10 mL
  Filled 2018-09-23: qty 10

## 2018-09-23 MED ORDER — SODIUM CHLORIDE 0.9 % IV SOLN
Freq: Once | INTRAVENOUS | Status: AC
  Administered 2018-09-23: 16:00:00 via INTRAVENOUS
  Filled 2018-09-23: qty 250

## 2018-09-23 MED ORDER — SODIUM CHLORIDE 0.9 % IV SOLN
200.0000 mg | Freq: Once | INTRAVENOUS | Status: AC
  Administered 2018-09-23: 200 mg via INTRAVENOUS
  Filled 2018-09-23: qty 8

## 2018-09-23 MED ORDER — SODIUM CHLORIDE 0.9% FLUSH
10.0000 mL | INTRAVENOUS | Status: AC | PRN
  Filled 2018-09-23: qty 10

## 2018-09-23 MED ORDER — HEPARIN SOD (PORK) LOCK FLUSH 100 UNIT/ML IV SOLN
500.0000 [IU] | Freq: Once | INTRAVENOUS | Status: AC | PRN
  Filled 2018-09-23: qty 5

## 2018-09-23 NOTE — Progress Notes (Signed)
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 She presented with large central necrotic right lower neck mass in addition to bulky mediastinal and bilateral hilar adenopathy as well as a small hypermetabolic lung lesion diagnosed in February 2019.  PRIOR THERAPY: Palliative radiotherapy to the large necrotic mass of the right neck under the care of Dr. Isidore Moos, completed on February 19, 2018.  CURRENT THERAPY: Palliative systemic chemotherapy with carboplatin for AUC of 5, paclitaxel 175 mg/M2 and Keytruda 200 mg IV every 3 weeks. First dose 03/03/2018.Beginning with cycle #5 she is receiving Keytruda only. Status post 9 total cycles.  INTERVAL HISTORY: Faith [name] 67 y.o. female returns for routine follow-up visit by herself.  The patient is feeling fine today and has no specific complaints except for mild itching.  She states that she is been using Benadryl as needed.  This is helpful.  She denies fevers and chills.  Denies chest pain, shortness of breath, cough, hemoptysis.  Denies nausea, vomiting, constipation, diarrhea.  Denies recent weight loss or night sweats.  Right neck mass has healed.  She had a restaging CT scan of the neck, chest, abdomen, pelvis and is here to discuss the results.  MEDICAL HISTORY: Past Medical History:  Diagnosis Date  . Anxiety   . Arthritis    "thighs; legs; hips" (07/05/2014)  . Bipolar disorder ([place])   . Cholelithiasis 07/2014  . Chronic bronchitis ([place])    "get it q yr"  . Chronic lower back pain   . Depression    pt. use to go to depression clinic, states she still has depression & anxiety but can't get to appt. so she hasn't had any med. for it in a while   . Dyspnea   . GERD (gastroesophageal reflux disease)   . High cholesterol   . History of radiation therapy 02/01/18- 02/12/18    Right Neck/ 30 Gy in 10 fractions.   . Hypertension   . Mass in neck 12/2017   RIGHT SIDE OF NECK  . Peripheral vascular disease ([place])   . Schizophrenia ([place])   . Stroke [facility])     ALLERGIES:  is allergic to penicillins; codeine; and percocet [oxycodone-acetaminophen].  MEDICATIONS:  Current Outpatient Medications  Medication Sig Dispense Refill  . acetaminophen (TYLENOL) 325 MG tablet Take 2 tablets (650 mg total) by mouth every 6 (six) hours as needed for mild pain (or Fever >/= 101).    Marland Kitchen albuterol (PROVENTIL HFA;VENTOLIN HFA) 108 (90 Base) MCG/ACT inhaler Inhale 1-2 puffs into the lungs every 6 (six) hours as needed for wheezing or shortness of breath.    Marland Kitchen albuterol (PROVENTIL) (2.5 MG/3ML) 0.083% nebulizer solution Take 2.5 mg by nebulization every 6 (six) hours as needed for wheezing or shortness of breath.    . AMITIZA 24 MCG capsule Take 24 mcg by mouth 2 (two) times daily.  0  . clopidogrel (PLAVIX) 75 MG tablet Take 1 tablet (75 mg total) by mouth daily. 30 tablet 0  . diclofenac sodium (VOLTAREN) 1 % GEL Apply 2 g topically daily as needed (pain).     Marland Kitchen diphenhydrAMINE (BENADRYL) 25 MG tablet Take 2 tablets (50 mg total) by mouth at bedtime as needed for sleep. 20 tablet 0  . fluticasone (FLONASE) 50 MCG/ACT nasal spray Place 2 sprays into both nostrils daily.   0  . furosemide (LASIX)  20 MG tablet Take 20 mg by mouth daily as needed for fluid or edema.     . gabapentin (NEURONTIN) 300 MG capsule Take 300 mg by mouth 3 (three) times daily.     Marland Kitchen ibuprofen (ADVIL,MOTRIN) 800 MG tablet Take 3 tablets by mouth 3 (three) times daily.    Marland Kitchen lidocaine (XYLOCAINE) 2 % solution Caregiver: Mix 1part 2% viscous lidocaine, 1part H20. Swish & swallow 22mL of diluted mixture, 61min before meals and at bedtime, up to QID 100 mL 5  . lidocaine-prilocaine (EMLA) cream Apply 1 application topically as needed. 30 g 3  . Linaclotide (LINZESS) 290 MCG CAPS capsule Take 290-580 mcg by mouth daily as  needed (constipation).     Marland Kitchen loratadine (CLARITIN) 10 MG tablet Take 10 mg by mouth daily.    . methocarbamol (ROBAXIN) 750 MG tablet Take 750 mg by mouth 2 (two) times daily as needed for muscle spasms.     . mometasone (ELOCON) 0.1 % ointment Apply 1 application topically daily as needed (irritation).     . nicotine polacrilex (NICORETTE) 2 MG gum Take 2 mg by mouth as needed for smoking cessation.    Marland Kitchen omeprazole (PRILOSEC) 20 MG capsule Take 20 mg by mouth 2 (two) times daily before a meal.    . Oxycodone HCl 20 MG TABS Take 1 tablet by mouth 4 (four) times daily as needed.  0  . oxyCODONE-acetaminophen (PERCOCET) 10-325 MG tablet Take 1 tablet by mouth every 6 (six) hours as needed for pain. 30 tablet 0  . polyethylene glycol (MIRALAX / GLYCOLAX) packet Take 17 g by mouth daily. 14 each 0  . potassium chloride SA (K-DUR,KLOR-CON) 20 MEQ tablet TAKE 2 TABLETS AS A SINGLE DOSE 20 tablet 0  . prochlorperazine (COMPAZINE) 10 MG tablet Take 1 tablet (10 mg total) by mouth every 6 (six) hours as needed for nausea or vomiting. 30 tablet 0  . SANTYL ointment APPLY  AA QD  1  . Vitamin D, Ergocalciferol, (DRISDOL) 50000 units CAPS capsule Take 50,000 Units by mouth every 7 (seven) days.     No current facility-administered medications for this visit.    Facility-Administered Medications Ordered in Other Visits  Medication Dose Route Frequency Provider Last Rate Last Dose  . heparin lock flush 100 unit/mL  500 Units Intracatheter Once PRN Curt Bears, MD      . pembrolizumab [facility]) 200 mg in sodium chloride 0.9 % 50 mL chemo infusion  200 mg Intravenous Once Curt Bears, MD 116 mL/hr at 09/23/18 1555 200 mg at 09/23/18 1555  . sodium chloride flush (NS) 0.9 % injection 10 mL  10 mL Intracatheter PRN Curt Bears, MD        SURGICAL HISTORY:  Past Surgical History:  Procedure Laterality Date  . ABDOMINAL AORTOGRAM N/A 01/07/2017   Procedure: Abdominal Aortogram;  Surgeon: Waynetta Sandy, MD;  Location: [facility];  Service: Cardiovascular;  Laterality: N/A;  . ABDOMINAL HYSTERECTOMY    . ANKLE FRACTURE SURGERY Right   . ANKLE HARDWARE REMOVAL Right   . APPENDECTOMY  ~ 1963  . BREAST BIOPSY Bilateral   . BREAST CYST EXCISION Bilateral    "not cancer"  . CATARACT EXTRACTION W/ INTRAOCULAR LENS  IMPLANT, BILATERAL    . CHOLECYSTECTOMY N/A 07/05/2014   Procedure: LAPAROSCOPIC CHOLECYSTECTOMY WITH INTRAOPERATIVE CHOLANGIOGRAM;  Surgeon: Gayland Curry, MD;  Location: [facility];  Service: General;  Laterality: N/A;  . EXCISIONAL HEMORRHOIDECTOMY    . FEMORAL-POPLITEAL  BYPASS GRAFT Right 01/15/2017   Procedure: BYPASS GRAFT FEMORAL-POPLITEAL ARTERY RIGHT LEG;  Surgeon: Waynetta Sandy, MD;  Location: [facility];  Service: Vascular;  Laterality: Right;  . IR IMAGING GUIDED PORT INSERTION  03/08/2018  . IR US GUIDE VASC ACCESS LEFT  03/08/2018  . LAPAROSCOPIC CHOLECYSTECTOMY  07/05/2014  . LOWER EXTREMITY ANGIOGRAPHY Bilateral 01/07/2017   Procedure: Lower Extremity Angiography;  Surgeon: Waynetta Sandy, MD;  Location: [facility];  Service: Cardiovascular;  Laterality: Bilateral;  . MASS BIOPSY Right 12/25/2017   Procedure: INCISIONAL RIGHT NECK MASS BIOPSY;  Surgeon: Helayne Seminole, MD;  Location: [facility];  Service: ENT;  Laterality: Right;  . MULTIPLE TOOTH EXTRACTIONS  05/2013   "pulled my upper teeth"  . PILONIDAL CYST EXCISION      REVIEW OF SYSTEMS:   Review of Systems  Constitutional: Negative for appetite change, chills, fatigue, fever and unexpected weight change.  HENT:   Negative for mouth sores, nosebleeds, sore throat and trouble swallowing.   Eyes: Negative for eye problems and icterus.  Respiratory: Negative for cough, hemoptysis, shortness of breath and wheezing.   Cardiovascular: Negative for chest pain and leg swelling.  Gastrointestinal: Negative for abdominal pain, constipation, diarrhea, nausea and vomiting.   Genitourinary: Negative for bladder incontinence, difficulty urinating, dysuria, frequency and hematuria.   Musculoskeletal: Negative for back pain, gait problem, neck pain and neck stiffness.  Skin: Positive for mild itching.  Neurological: Negative for dizziness, extremity weakness, gait problem, headaches, light-headedness and seizures.  Hematological: Negative for adenopathy. Does not bruise/bleed easily.  Psychiatric/Behavioral: Negative for confusion, depression and sleep disturbance. The patient is not nervous/anxious.     PHYSICAL EXAMINATION:  Blood pressure (!) 146/85, pulse 67, temperature 98.4 F (36.9 C), temperature source Oral, resp. rate 16, height 5\' 1"  (1.549 m), weight 122 lb 1.6 oz (55.4 kg), SpO2 99 %.  ECOG PERFORMANCE STATUS: 1 - Symptomatic but completely ambulatory  Physical Exam  Constitutional: Oriented to person, place, and time and well-developed, well-nourished, and in no distress. No distress.  HENT:  Head: Normocephalic and atraumatic.  Mouth/Throat: Oropharynx is clear and moist. No oropharyngeal exudate.  Eyes: Conjunctivae are normal. Right eye exhibits no discharge. Left eye exhibits no discharge. No scleral icterus.  Neck: Normal range of motion. Neck supple.  Right neck mass has healed. Cardiovascular: Normal rate, regular rhythm, normal heart sounds and intact distal pulses.   Pulmonary/Chest: Effort normal and breath sounds normal. No respiratory distress. No wheezes. No rales.  Abdominal: Soft. Bowel sounds are normal. Exhibits no distension and no mass. There is no tenderness.  Musculoskeletal: Normal range of motion. Exhibits no edema.  Lymphadenopathy:    No cervical adenopathy.  Neurological: Alert and oriented to person, place, and time. Exhibits normal muscle tone. Gait normal. Coordination normal.  Skin: Skin is warm and dry. No rash noted. Not diaphoretic. No erythema. No pallor.  Psychiatric: Mood, memory and judgment normal.  Vitals  reviewed.  LABORATORY DATA: Lab Results  Component Value Date   WBC 6.2 09/23/2018   HGB 12.0 09/23/2018   HCT 36.2 09/23/2018   MCV 85.4 09/23/2018   PLT 167 09/23/2018      Chemistry      Component Value Date/Time   NA 139 09/23/2018 1326   K 4.2 09/23/2018 1326   CL 107 09/23/2018 1326   CO2 24 09/23/2018 1326   BUN 10 09/23/2018 1326   CREATININE 0.62 09/23/2018 1326      Component Value Date/Time  CALCIUM 9.6 09/23/2018 1326   ALKPHOS 91 09/23/2018 1326   AST 13 (L) 09/23/2018 1326   ALT 8 09/23/2018 1326   BILITOT 0.5 09/23/2018 1326       RADIOGRAPHIC STUDIES:  Ct Soft Tissue Neck W Contrast  Result Date: 09/21/2018 CLINICAL DATA:  07/16/2018 CT neck. EXAM: CT NECK WITH CONTRAST TECHNIQUE: Multidetector CT imaging of the neck was performed using the standard protocol following the bolus administration of intravenous contrast. CONTRAST:  15mL OMNIPAQUE IOHEXOL 300 MG/ML  SOLN COMPARISON:  07/16/2018 and 04/28/2018 CT neck. 01/18/2018 PET-CT. FINDINGS: Pharynx and larynx: Normal. No mass or swelling. Salivary glands: No inflammation, mass, or stone. Asymmetric enhancement in the right submandibular gland seen on the prior CT of neck is no longer evident. Thyroid: Normal. Lymph nodes: Interval enlargement and irregular enhancement of the lymph node just posterior to the right parotid space now measuring 11 x 11 mm (AP by ML series 2, image 30). Additional cervical lymph nodes are not enlarged and stable. Vascular: Calcific atherosclerosis of the aorta. Stable severe calcific atherosclerosis of the left carotid bifurcation with occlusion of the left internal carotid artery in the neck. Stable calcified plaque of the right carotid bifurcation with approximately moderate proximal ICA stenosis. Patent internal jugular veins. Limited intracranial: Negative. Visualized orbits: Negative. Mastoids and visualized paranasal sinuses: Clear. Skeleton: No acute or aggressive process.  Upper chest: Scarring in the right lung apex, probably radiation changes. Other: Nodular focus of enhancement at the site of treated right lower neck mass measures 7 mm, decreased in size from the prior CT of the neck. There is surrounding non masslike and nonenhancing ill-defined soft tissue compatible with postsurgical/radiation changes. IMPRESSION: 1. Increased size and enhancement of lymphadenopathy posterior to the right parotid tail likely representing metastasis. 2. Nodular enhancement at the site of treated right lower neck mass is further decreased in size from the prior CT of the neck. 3. Stable right lower neck non masslike and nonenhancing soft tissue compatible with postsurgical/radiation changes. 4. Chronic left ICA occlusion. 5. Please refer to the concurrent CT of chest, abdomen, and pelvis. Electronically Signed   By: Kristine Garbe M.D.   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 Restaging. EXAM: CT CHEST, ABDOMEN, AND PELVIS WITH CONTRAST TECHNIQUE: Multidetector CT imaging of the chest, abdomen and pelvis was performed following the standard protocol during bolus administration of intravenous contrast. CONTRAST:  152mL OMNIPAQUE IOHEXOL 300 MG/ML  SOLN COMPARISON:  07/16/2018 CT chest, abdomen and pelvis. FINDINGS: CT CHEST FINDINGS Cardiovascular: Normal heart size. No significant pericardial effusion/thickening. Right coronary atherosclerosis. Left internal jugular MediPort terminates in lower third of the superior vena cava. Atherosclerotic nonaneurysmal thoracic aorta. 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 Normal caliber pulmonary arteries. No central pulmonary emboli. Mediastinum/Nodes: No discrete thyroid nodules. Unremarkable esophagus. No axillary  adenopathy. Enlarged 1.4 cm right lower paratracheal node (series 2/image 22), increased from 1.1 cm. Right subcarinal/infrahilar infiltrative mass measures 4.9 x 3.4 cm (series 2/image 31), increased from 4.1 x 2.8 cm. Enlarged 1.5 cm right hilar node (series 2/image 29), increased from 1.1 cm. No left mediastinal or left hilar adenopathy. Lungs/Pleura: No pneumothorax. Trace dependent right pleural effusion, new. No left pleural effusion. Mild centrilobular and paraseptal emphysema with mild diffuse bronchial wall thickening. Bronchus intermedius and lobar bronchi of the right  middle lobe and right lower lobe are now occluded. Complete right middle lobe atelectasis is unchanged. Mild patchy consolidation and ground-glass opacity in the medial apical right upper lobe is increased, favor evolving postradiation change. Patchy ground-glass opacity and scattered nodularity throughout the right lower lobe is increased. For example a new medial central right lower lobe 1.5 cm pulmonary nodule (series 4/image 101). A peripheral 0.7 cm right lower lobe nodule (series 4/image 86) is increased from 0.5 cm. There is new postobstructive atelectasis in anterior right lower lobe. Left upper lobe sub solid 0.8 cm pulmonary nodule anteriorly (series 4/image 42), previously 0.9 cm, stable. Cavitary 0.6 cm peripheral left upper lobe nodule (series 4/image 53), previously 0.6 cm, stable. Solid 0.4 cm peripheral left lower lobe nodule (series 4/image 87), stable. Posterior left lower lobe irregular 0.6 cm solid pulmonary nodule (series 4/image 72), stable accounting for differences in slice collimation. Musculoskeletal: No aggressive appearing focal osseous lesions. Mild thoracic spondylosis. CT ABDOMEN PELVIS FINDINGS Hepatobiliary: Normal liver size. Simple 1.1 cm posterior inferior right liver lobe cyst is stable. Subcentimeter hypodense lesions in the lateral segment left liver lobe have been present since 09/11/2015 CT and are  unchanged in the interval, considered benign. No new liver lesions. Cholecystectomy. Bile ducts are stable and within normal post cholecystectomy limits. Pancreas: Normal, with no mass or duct dilation. Spleen: Normal size. No mass. Adrenals/Urinary Tract: Normal adrenals. Simple 2.2 cm anterior interpolar right renal cysts. Subcentimeter hypodense upper left renal lesion is too small to characterize and unchanged, considered benign. No new renal lesions. No hydronephrosis. Normal bladder. Stomach/Bowel: Normal non-distended stomach. Normal caliber small bowel with no small bowel wall thickening. Appendectomy. Moderate colorectal stool volume. No large bowel wall thickening, significant diverticulosis or acute pericolonic fat stranding. Oral contrast transits to the rectum. Vascular/Lymphatic: Atherosclerotic nonaneurysmal abdominal aorta. Patent portal, splenic, hepatic and renal veins. No pathologically enlarged lymph nodes in the abdomen or pelvis. Reproductive: Status post hysterectomy, with no abnormal findings at the vaginal cuff. No adnexal mass. Other: No pneumoperitoneum, ascites or focal fluid collection. Musculoskeletal: No aggressive appearing focal osseous lesions. Mild lumbar spondylosis. Stable sclerosis in the bilateral superior femoral heads most compatible with chronic avascular necrosis. IMPRESSION: 1. Continued growth of infiltrative right subcarinal/infrahilar adenopathy. Interval growth of right lower paratracheal and right hilar adenopathy. 2. Bronchus intermedius and lobar bronchi of the right middle and right lower lobes are now occluded. Stable complete right middle lobe atelectasis. New anterior right lower lobe atelectasis. 3. Increased patchy nodularity and ground-glass opacity throughout the right lower lobe, favor a combination of metastatic disease and postobstructive pneumonitis. 4. Scattered subcentimeter nodularity in the left lung is stable. 5. Increased patchy opacity in the  apical right upper lobe, favor evolving postradiation change. 6. New trace dependent right pleural effusion. 7. No evidence of metastatic disease in the abdomen, pelvis or osseous structures. 8. Aortic Atherosclerosis (ICD10-I70.0) and Emphysema (ICD10-J43.9). Electronically Signed   By: Ilona Sorrel M.D.   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 Restaging. EXAM: CT CHEST, ABDOMEN, AND PELVIS WITH CONTRAST TECHNIQUE: Multidetector CT imaging of the chest, abdomen and pelvis was performed following the standard protocol during bolus administration of intravenous contrast. CONTRAST:  17mL OMNIPAQUE IOHEXOL 300 MG/ML  SOLN COMPARISON:  07/16/2018 CT chest, abdomen and pelvis. FINDINGS: CT CHEST FINDINGS Cardiovascular: Normal heart size. No significant  pericardial effusion/thickening. Right coronary atherosclerosis. Left internal jugular MediPort terminates in lower third of the superior vena cava. Atherosclerotic nonaneurysmal thoracic aorta. 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 Normal caliber pulmonary arteries. No central pulmonary emboli. Mediastinum/Nodes: No discrete thyroid nodules. Unremarkable esophagus. No axillary adenopathy. Enlarged 1.4 cm right lower paratracheal node (series 2/image 22), increased from 1.1 cm. Right subcarinal/infrahilar infiltrative mass measures 4.9 x 3.4 cm (series 2/image 31), increased from 4.1 x 2.8 cm. Enlarged 1.5 cm right hilar node (series 2/image 29), increased from 1.1 cm. No left mediastinal or left hilar adenopathy. Lungs/Pleura: No pneumothorax. Trace dependent right pleural effusion, new. No left pleural effusion. Mild centrilobular and paraseptal emphysema with mild diffuse bronchial wall thickening. Bronchus intermedius and  lobar bronchi of the right middle lobe and right lower lobe are now occluded. Complete right middle lobe atelectasis is unchanged. Mild patchy consolidation and ground-glass opacity in the medial apical right upper lobe is increased, favor evolving postradiation change. Patchy ground-glass opacity and scattered nodularity throughout the right lower lobe is increased. For example a new medial central right lower lobe 1.5 cm pulmonary nodule (series 4/image 101). A peripheral 0.7 cm right lower lobe nodule (series 4/image 86) is increased from 0.5 cm. There is new postobstructive atelectasis in anterior right lower lobe. Left upper lobe sub solid 0.8 cm pulmonary nodule anteriorly (series 4/image 42), previously 0.9 cm, stable. Cavitary 0.6 cm peripheral left upper lobe nodule (series 4/image 53), previously 0.6 cm, stable. Solid 0.4 cm peripheral left lower lobe nodule (series 4/image 87), stable. Posterior left lower lobe irregular 0.6 cm solid pulmonary nodule (series 4/image 72), stable accounting for differences in slice collimation. Musculoskeletal: No aggressive appearing focal osseous lesions. Mild thoracic spondylosis. CT ABDOMEN PELVIS FINDINGS Hepatobiliary: Normal liver size. Simple 1.1 cm posterior inferior right liver lobe cyst is stable. Subcentimeter hypodense lesions in the lateral segment left liver lobe have been present since 09/11/2015 CT and are unchanged in the interval, considered benign. No new liver lesions. Cholecystectomy. Bile ducts are stable and within normal post cholecystectomy limits. Pancreas: Normal, with no mass or duct dilation. Spleen: Normal size. No mass. Adrenals/Urinary Tract: Normal adrenals. Simple 2.2 cm anterior interpolar right renal cysts. Subcentimeter hypodense upper left renal lesion is too small to characterize and unchanged, considered benign. No new renal lesions. No hydronephrosis. Normal bladder. Stomach/Bowel: Normal non-distended stomach. Normal caliber small  bowel with no small bowel wall thickening. Appendectomy. Moderate colorectal stool volume. No large bowel wall thickening, significant diverticulosis or acute pericolonic fat stranding. Oral contrast transits to the rectum. Vascular/Lymphatic: Atherosclerotic nonaneurysmal abdominal aorta. Patent portal, splenic, hepatic and renal veins. No pathologically enlarged lymph nodes in the abdomen or pelvis. Reproductive: Status post hysterectomy, with no abnormal findings at the vaginal cuff. No adnexal mass. Other: No pneumoperitoneum, ascites or focal fluid collection. Musculoskeletal: No aggressive appearing focal osseous lesions. Mild lumbar spondylosis. Stable sclerosis in the bilateral superior femoral heads most compatible with chronic avascular necrosis. IMPRESSION: 1. Continued growth of infiltrative right subcarinal/infrahilar adenopathy. Interval growth of right lower paratracheal and right hilar adenopathy. 2. Bronchus intermedius and lobar bronchi of the right middle and right lower lobes are now occluded. Stable complete right middle lobe atelectasis. New anterior right lower lobe atelectasis. 3. Increased patchy nodularity and ground-glass opacity throughout the right lower lobe, favor a combination of metastatic disease and postobstructive pneumonitis. 4. Scattered subcentimeter nodularity in the left lung is stable. 5. Increased patchy opacity in the  apical right upper lobe, favor evolving postradiation change. 6. New trace dependent right pleural effusion. 7. No evidence of metastatic disease in the abdomen, pelvis or osseous structures. 8. Aortic Atherosclerosis (ICD10-I70.0) and Emphysema (ICD10-J43.9). Electronically Signed   By: Ilona Sorrel M.D.   On: 09/22/2018 09:51     ASSESSMENT/PLAN:  Non-small cell carcinoma of right lung, stage 4 (HCC) This is a very pleasant 67year old African-American female with metastatic poorly differentiated carcinoma highly suspicious for primary lung cancer  versus head and neck cancer. She is status post palliative radiotherapy to the large right neck mass. The patientinitially receivedsystemic chemotherapy with carboplatin, paclitaxel and Keytruda status post4cyclesand starting with cycle 5 was started on maintenance Keytruda.She is status post 9 total cycles of her treatment. The patient continues to tolerate this treatment well with no concerning complaintsexcept for mild itching.   She had a restaging CT scan of the neck, chest, abdomen, and pelvis and is here to discuss the results.  The patient was seen with Dr. Julien Nordmann.  CT scan results were discussed with the patient which are overall stable.  We discussed that there is mild increase in the right subcarinal/infrahilar adenopathy but not enough to change therapy at this time.  We will continue to watch this on future scans.  The patient will proceed with cycle #10 of her treatment today as scheduled.  She will follow-up in 3 weeks for evaluation prior to cycle #11.  For pain management she will continue with the current pain medicationoxycodone.  For itching, she was advised to use Benadryl on as-needed basis.  She was advised to call immediately if she has any concerning symptoms in the interval. The patient voices understanding of current disease status and treatment options and is in agreement with the current care plan.  All questions were answered. The patient knows to call the clinic with any problems, questions or concerns. We can certainly see the patient much sooner if necessary.   No orders of the defined types were placed in this encounter.    Mikey Bussing, DNP, AGPCNP-BC, AOCNP 09/23/18   ADDENDUM: Hematology/Oncology Attending: I had a face-to-face encounter with the patient today.  I recommended her care plan.  This is a very pleasant 67 years old African-American female with metastatic poorly differentiated carcinoma status post systemic chemotherapy with  carboplatin, paclitaxel and Keytruda for 4 cycles and she is currently undergoing treatment with single agent Ketruda (pembrolizumab) status post 9 cycles. The patient has been tolerating this treatment well with no concerning adverse effects. She had repeat CT scan of the neck and the chest performed recently.  I personally and independently reviewed the scans and discussed the results with the patient today.  Her scan showed stable disease except for mild increase in the right subcarinal as well as infrahilar adenopathy but not to the point of progression to change her treatment at this point. I recommended for the patient to continue her current treatment with [facility] and she will proceed with cycle #10 today. I will see her back for follow-up visit in 3 weeks for evaluation before the next cycle of her treatment. She was advised to call immediately if she has any concerning symptoms in the interval.  Disclaimer: This note was dictated with voice recognition software. Similar sounding words can inadvertently be transcribed and may be missed upon review. Eilleen Kempf, MD 09/23/18

## 2018-09-23 NOTE — Assessment & Plan Note (Addendum)
This is a very pleasant 67year old African-American female with metastatic poorly differentiated carcinoma highly suspicious for primary lung cancer versus head and neck cancer. She is status post palliative radiotherapy to the large right neck mass. The patientinitially receivedsystemic chemotherapy with carboplatin, paclitaxel and Keytruda status post4cyclesand starting with cycle 5 was started on maintenance Keytruda.She is status post 9 total cycles of her treatment. The patient continues to tolerate this treatment well with no concerning complaintsexcept for mild itching.   She had a restaging CT scan of the neck, chest, abdomen, and pelvis and is here to discuss the results.  The patient was seen with Dr. Julien Nordmann.  CT scan results were discussed with the patient which are overall stable.  We discussed that there is mild increase in the right subcarinal/infrahilar adenopathy but not enough to change therapy at this time.  We will continue to watch this on future scans.  The patient will proceed with cycle #10 of her treatment today as scheduled.  She will follow-up in 3 weeks for evaluation prior to cycle #11.  For pain management she will continue with the current pain medicationoxycodone.  For itching, she was advised to use Benadryl on as-needed basis.  She was advised to call immediately if she has any concerning symptoms in the interval. The patient voices understanding of current disease status and treatment options and is in agreement with the current care plan.  All questions were answered. The patient knows to call the clinic with any problems, questions or concerns. We can certainly see the patient much sooner if necessary.

## 2018-09-23 NOTE — Patient Instructions (Signed)
[facility] Discharge Instructions for Patients Receiving Chemotherapy  Today you received the following chemotherapy agents :  Keytruda.  To help prevent nausea and vomiting after your treatment, we encourage you to take your nausea medication as prescribed.   If you develop nausea and vomiting that is not controlled by your nausea medication, call the clinic.   BELOW ARE SYMPTOMS THAT SHOULD BE REPORTED IMMEDIATELY:  *FEVER GREATER THAN 100.5 F  *CHILLS WITH OR WITHOUT FEVER  NAUSEA AND VOMITING THAT IS NOT CONTROLLED WITH YOUR NAUSEA MEDICATION  *UNUSUAL SHORTNESS OF BREATH  *UNUSUAL BRUISING OR BLEEDING  TENDERNESS IN MOUTH AND THROAT WITH OR WITHOUT PRESENCE OF ULCERS  *URINARY PROBLEMS  *BOWEL PROBLEMS  UNUSUAL RASH Items with * indicate a potential emergency and should be followed up as soon as possible.  Feel free to call the clinic should you have any questions or concerns. The clinic phone number is [phone].  Please show the CHEMO ALERT CARD at check-in to the Emergency Department and triage nurse.  

## 2018-09-23 NOTE — Telephone Encounter (Signed)
Scheduled appt per 10/24 los - pt to get an updated schedule next visit.

## 2018-10-04 ENCOUNTER — Encounter (HOSPITAL_BASED_OUTPATIENT_CLINIC_OR_DEPARTMENT_OTHER): Payer: Medicare Other | Attending: Internal Medicine

## 2018-10-04 DIAGNOSIS — Y838 Other surgical procedures as the cause of abnormal reaction of the patient, or of later complication, without mention of misadventure at the time of the procedure: Secondary | ICD-10-CM | POA: Diagnosis not present

## 2018-10-04 DIAGNOSIS — C3490 Malignant neoplasm of unspecified part of unspecified bronchus or lung: Secondary | ICD-10-CM | POA: Insufficient documentation

## 2018-10-04 DIAGNOSIS — I10 Essential (primary) hypertension: Secondary | ICD-10-CM | POA: Diagnosis not present

## 2018-10-04 DIAGNOSIS — Z79899 Other long term (current) drug therapy: Secondary | ICD-10-CM | POA: Diagnosis not present

## 2018-10-04 DIAGNOSIS — Z923 Personal history of irradiation: Secondary | ICD-10-CM | POA: Insufficient documentation

## 2018-10-04 DIAGNOSIS — C760 Malignant neoplasm of head, face and neck: Secondary | ICD-10-CM | POA: Diagnosis not present

## 2018-10-04 DIAGNOSIS — T8131XA Disruption of external operation (surgical) wound, not elsewhere classified, initial encounter: Secondary | ICD-10-CM | POA: Diagnosis present

## 2018-10-14 ENCOUNTER — Inpatient Hospital Stay: Payer: Medicare Other | Admitting: Oncology

## 2018-10-14 ENCOUNTER — Inpatient Hospital Stay: Payer: Medicare Other | Admitting: Nutrition

## 2018-10-14 ENCOUNTER — Inpatient Hospital Stay: Payer: Medicare Other

## 2018-10-14 ENCOUNTER — Telehealth: Payer: Self-pay | Admitting: Medical Oncology

## 2018-10-14 ENCOUNTER — Inpatient Hospital Stay: Payer: Medicare Other | Attending: Internal Medicine

## 2018-10-14 NOTE — Telephone Encounter (Signed)
Pt cancelled appt today .

## 2018-10-15 ENCOUNTER — Telehealth: Payer: Self-pay | Admitting: *Deleted

## 2018-10-15 NOTE — Telephone Encounter (Signed)
Left message below for patient's daughter. Requested that she call us back to discuss.

## 2018-10-15 NOTE — Telephone Encounter (Signed)
-----   Message from Maryanna Shape, NP sent at 10/15/2018  8:46 AM EST ----- Regarding: Call pt Liam Cammarata  Please call pt. She was supposed to see me yesterday but she canceled and wanted to reschedule.  Please let her know that we are full in the infusion area next week and the week after.  I am not sure if I can get her back in prior to her December 5 appointment that is already on the books.   I will not be able to get her back in until the December 5 appointment that is already on the books.  Thanks

## 2018-10-18 ENCOUNTER — Telehealth: Payer: Self-pay | Admitting: Oncology

## 2018-10-20 ENCOUNTER — Encounter (HOSPITAL_COMMUNITY): Payer: Self-pay | Admitting: Emergency Medicine

## 2018-10-20 ENCOUNTER — Emergency Department (HOSPITAL_COMMUNITY): Payer: Medicare Other

## 2018-10-20 ENCOUNTER — Other Ambulatory Visit: Payer: Self-pay

## 2018-10-20 ENCOUNTER — Inpatient Hospital Stay (HOSPITAL_COMMUNITY)
Admission: EM | Admit: 2018-10-20 | Discharge: 2018-10-25 | DRG: 388 | Disposition: A | Discharge status: Home / Self Care | Payer: Medicare Other | Attending: Internal Medicine | Admitting: Internal Medicine

## 2018-10-20 DIAGNOSIS — Z961 Presence of intraocular lens: Secondary | ICD-10-CM | POA: Diagnosis present

## 2018-10-20 DIAGNOSIS — Z885 Allergy status to narcotic agent status: Secondary | ICD-10-CM

## 2018-10-20 DIAGNOSIS — Z8249 Family history of ischemic heart disease and other diseases of the circulatory system: Secondary | ICD-10-CM

## 2018-10-20 DIAGNOSIS — Z809 Family history of malignant neoplasm, unspecified: Secondary | ICD-10-CM

## 2018-10-20 DIAGNOSIS — Z8673 Personal history of transient ischemic attack (TIA), and cerebral infarction without residual deficits: Secondary | ICD-10-CM | POA: Diagnosis not present

## 2018-10-20 DIAGNOSIS — Z90710 Acquired absence of both cervix and uterus: Secondary | ICD-10-CM

## 2018-10-20 DIAGNOSIS — E876 Hypokalemia: Secondary | ICD-10-CM | POA: Diagnosis not present

## 2018-10-20 DIAGNOSIS — Z87891 Personal history of nicotine dependence: Secondary | ICD-10-CM

## 2018-10-20 DIAGNOSIS — E785 Hyperlipidemia, unspecified: Secondary | ICD-10-CM | POA: Diagnosis present

## 2018-10-20 DIAGNOSIS — E860 Dehydration: Secondary | ICD-10-CM | POA: Diagnosis present

## 2018-10-20 DIAGNOSIS — Z9841 Cataract extraction status, right eye: Secondary | ICD-10-CM

## 2018-10-20 DIAGNOSIS — F319 Bipolar disorder, unspecified: Secondary | ICD-10-CM | POA: Diagnosis present

## 2018-10-20 DIAGNOSIS — K5641 Fecal impaction: Principal | ICD-10-CM | POA: Diagnosis present

## 2018-10-20 DIAGNOSIS — J189 Pneumonia, unspecified organism: Secondary | ICD-10-CM | POA: Diagnosis present

## 2018-10-20 DIAGNOSIS — F209 Schizophrenia, unspecified: Secondary | ICD-10-CM | POA: Diagnosis present

## 2018-10-20 DIAGNOSIS — I10 Essential (primary) hypertension: Secondary | ICD-10-CM | POA: Diagnosis present

## 2018-10-20 DIAGNOSIS — C799 Secondary malignant neoplasm of unspecified site: Secondary | ICD-10-CM

## 2018-10-20 DIAGNOSIS — Z9221 Personal history of antineoplastic chemotherapy: Secondary | ICD-10-CM

## 2018-10-20 DIAGNOSIS — Z7902 Long term (current) use of antithrombotics/antiplatelets: Secondary | ICD-10-CM

## 2018-10-20 DIAGNOSIS — F419 Anxiety disorder, unspecified: Secondary | ICD-10-CM | POA: Diagnosis present

## 2018-10-20 DIAGNOSIS — Z923 Personal history of irradiation: Secondary | ICD-10-CM

## 2018-10-20 DIAGNOSIS — M545 Low back pain: Secondary | ICD-10-CM | POA: Diagnosis present

## 2018-10-20 DIAGNOSIS — Z833 Family history of diabetes mellitus: Secondary | ICD-10-CM

## 2018-10-20 DIAGNOSIS — E875 Hyperkalemia: Secondary | ICD-10-CM | POA: Diagnosis present

## 2018-10-20 DIAGNOSIS — Z9842 Cataract extraction status, left eye: Secondary | ICD-10-CM

## 2018-10-20 DIAGNOSIS — C3491 Malignant neoplasm of unspecified part of right bronchus or lung: Secondary | ICD-10-CM | POA: Diagnosis present

## 2018-10-20 DIAGNOSIS — Z880 Allergy status to penicillin: Secondary | ICD-10-CM

## 2018-10-20 DIAGNOSIS — I739 Peripheral vascular disease, unspecified: Secondary | ICD-10-CM | POA: Diagnosis present

## 2018-10-20 DIAGNOSIS — Z888 Allergy status to other drugs, medicaments and biological substances status: Secondary | ICD-10-CM

## 2018-10-20 DIAGNOSIS — K219 Gastro-esophageal reflux disease without esophagitis: Secondary | ICD-10-CM | POA: Diagnosis present

## 2018-10-20 DIAGNOSIS — Z9049 Acquired absence of other specified parts of digestive tract: Secondary | ICD-10-CM

## 2018-10-20 DIAGNOSIS — I493 Ventricular premature depolarization: Secondary | ICD-10-CM | POA: Diagnosis present

## 2018-10-20 DIAGNOSIS — J9811 Atelectasis: Secondary | ICD-10-CM | POA: Diagnosis present

## 2018-10-20 DIAGNOSIS — E872 Acidosis: Secondary | ICD-10-CM | POA: Diagnosis present

## 2018-10-20 DIAGNOSIS — Z79899 Other long term (current) drug therapy: Secondary | ICD-10-CM

## 2018-10-20 DIAGNOSIS — E7800 Pure hypercholesterolemia, unspecified: Secondary | ICD-10-CM | POA: Diagnosis present

## 2018-10-20 DIAGNOSIS — G893 Neoplasm related pain (acute) (chronic): Secondary | ICD-10-CM | POA: Diagnosis not present

## 2018-10-20 DIAGNOSIS — B370 Candidal stomatitis: Secondary | ICD-10-CM | POA: Diagnosis present

## 2018-10-20 DIAGNOSIS — R079 Chest pain, unspecified: Secondary | ICD-10-CM

## 2018-10-20 DIAGNOSIS — Z79891 Long term (current) use of opiate analgesic: Secondary | ICD-10-CM

## 2018-10-20 DIAGNOSIS — J42 Unspecified chronic bronchitis: Secondary | ICD-10-CM | POA: Diagnosis present

## 2018-10-20 DIAGNOSIS — G8929 Other chronic pain: Secondary | ICD-10-CM | POA: Diagnosis present

## 2018-10-20 DIAGNOSIS — M720 Palmar fascial fibromatosis [Dupuytren]: Secondary | ICD-10-CM | POA: Diagnosis present

## 2018-10-20 LAB — COMPREHENSIVE METABOLIC PANEL
ALBUMIN: 4.9 g/dL (ref 3.5–5.0)
ALT: 12 U/L (ref 0–44)
AST: 21 U/L (ref 15–41)
Alkaline Phosphatase: 92 U/L (ref 38–126)
Anion gap: 12 (ref 5–15)
BUN: 19 mg/dL (ref 8–23)
CO2: 22 mmol/L (ref 22–32)
CREATININE: 0.67 mg/dL (ref 0.44–1.00)
Calcium: 10.9 mg/dL — ABNORMAL HIGH (ref 8.9–10.3)
Chloride: 104 mmol/L (ref 98–111)
GFR calc Af Amer: >60 mL/min (ref >60)
GLUCOSE: 121 mg/dL — AB (ref 70–99)
POTASSIUM: 2.9 mmol/L — AB (ref 3.5–5.1)
SODIUM: 138 mmol/L (ref 135–145)
Total Bilirubin: 1.1 mg/dL (ref 0.3–1.2)
Total Protein: 8.8 g/dL — ABNORMAL HIGH (ref 6.5–8.1)

## 2018-10-20 LAB — CBC
HEMATOCRIT: 43 % (ref 36.0–46.0)
HEMOGLOBIN: 14.4 g/dL (ref 12.0–15.0)
MCH: 29 pg (ref 26.0–34.0)
MCHC: 33.5 g/dL (ref 30.0–36.0)
MCV: 86.7 fL (ref 80.0–100.0)
Platelets: 230 10*3/uL (ref 150–400)
RBC: 4.96 MIL/uL (ref 3.87–5.11)
RDW: 14.6 % (ref 11.5–15.5)
WBC: 9.5 10*3/uL (ref 4.0–10.5)
nRBC: 0 % (ref 0.0–0.2)

## 2018-10-20 LAB — LIPASE, BLOOD: Lipase: 21 U/L (ref 11–51)

## 2018-10-20 MED ORDER — ACETAMINOPHEN 325 MG PO TABS
650.0000 mg | ORAL_TABLET | Freq: Four times a day (QID) | ORAL | Status: DC | PRN
  Administered 2018-10-24: 650 mg via ORAL
  Filled 2018-10-20: qty 2

## 2018-10-20 MED ORDER — POTASSIUM CHLORIDE 10 MEQ/100ML IV SOLN
10.0000 meq | INTRAVENOUS | Status: AC
  Administered 2018-10-21: 10 meq via INTRAVENOUS
  Filled 2018-10-20 (×2): qty 100

## 2018-10-20 MED ORDER — METHOCARBAMOL 500 MG PO TABS
750.0000 mg | ORAL_TABLET | Freq: Two times a day (BID) | ORAL | Status: DC | PRN
  Administered 2018-10-21: 750 mg via ORAL
  Filled 2018-10-20: qty 2

## 2018-10-20 MED ORDER — ONDANSETRON HCL 4 MG/2ML IJ SOLN
4.0000 mg | Freq: Four times a day (QID) | INTRAMUSCULAR | Status: DC | PRN
  Administered 2018-10-23: 4 mg via INTRAVENOUS
  Filled 2018-10-20: qty 2

## 2018-10-20 MED ORDER — ALBUTEROL SULFATE (2.5 MG/3ML) 0.083% IN NEBU: 2.5000 mg | INHALATION_SOLUTION | Freq: Four times a day (QID) | RESPIRATORY_TRACT | Status: DC | PRN

## 2018-10-20 MED ORDER — OXYCODONE HCL 5 MG PO TABS
20.0000 mg | ORAL_TABLET | Freq: Four times a day (QID) | ORAL | Status: DC | PRN
  Administered 2018-10-21 – 2018-10-25 (×8): 20 mg via ORAL
  Filled 2018-10-20 (×10): qty 4

## 2018-10-20 MED ORDER — NICOTINE POLACRILEX 2 MG MT GUM
2.0000 mg | CHEWING_GUM | OROMUCOSAL | Status: DC | PRN
  Administered 2018-10-24: 2 mg via ORAL
  Filled 2018-10-20 (×2): qty 1

## 2018-10-20 MED ORDER — SODIUM CHLORIDE 0.9% FLUSH: 3.0000 mL | INTRAVENOUS | Status: DC | PRN

## 2018-10-20 MED ORDER — HYDROMORPHONE HCL 1 MG/ML IJ SOLN
0.5000 mg | Freq: Once | INTRAMUSCULAR | Status: AC
  Administered 2018-10-20: 0.5 mg via INTRAVENOUS
  Filled 2018-10-20: qty 1

## 2018-10-20 MED ORDER — ENOXAPARIN SODIUM 40 MG/0.4ML ~~LOC~~ SOLN
40.0000 mg | SUBCUTANEOUS | Status: DC
  Administered 2018-10-23 – 2018-10-24 (×2): 40 mg via SUBCUTANEOUS
  Filled 2018-10-20 (×3): qty 0.4

## 2018-10-20 MED ORDER — SODIUM CHLORIDE 0.9 % IV BOLUS
1000.0000 mL | Freq: Once | INTRAVENOUS | Status: AC
  Administered 2018-10-20: 1000 mL via INTRAVENOUS

## 2018-10-20 MED ORDER — POTASSIUM CHLORIDE 20 MEQ/15ML (10%) PO SOLN
40.0000 meq | Freq: Every day | ORAL | Status: DC
  Administered 2018-10-21 – 2018-10-25 (×4): 40 meq via ORAL
  Filled 2018-10-20 (×4): qty 30

## 2018-10-20 MED ORDER — ACETAMINOPHEN 650 MG RE SUPP: 650.0000 mg | Freq: Four times a day (QID) | RECTAL | Status: DC | PRN

## 2018-10-20 MED ORDER — SODIUM CHLORIDE 0.9 % IV SOLN: 250.0000 mL | INTRAVENOUS | Status: DC | PRN

## 2018-10-20 MED ORDER — POTASSIUM CHLORIDE 10 MEQ/100ML IV SOLN
10.0000 meq | Freq: Once | INTRAVENOUS | Status: AC
  Administered 2018-10-20: 10 meq via INTRAVENOUS
  Filled 2018-10-20: qty 100

## 2018-10-20 MED ORDER — SODIUM CHLORIDE 0.9% FLUSH
3.0000 mL | Freq: Two times a day (BID) | INTRAVENOUS | Status: DC
  Administered 2018-10-21 – 2018-10-24 (×4): 3 mL via INTRAVENOUS

## 2018-10-20 MED ORDER — MINERAL OIL RE ENEM
1.0000 | ENEMA | Freq: Once | RECTAL | Status: DC | PRN
  Filled 2018-10-20 (×2): qty 1

## 2018-10-20 MED ORDER — PANTOPRAZOLE SODIUM 40 MG PO TBEC
40.0000 mg | DELAYED_RELEASE_TABLET | Freq: Every day | ORAL | Status: DC
  Administered 2018-10-21 – 2018-10-25 (×4): 40 mg via ORAL
  Filled 2018-10-20 (×4): qty 1

## 2018-10-20 MED ORDER — GABAPENTIN 300 MG PO CAPS
300.0000 mg | ORAL_CAPSULE | Freq: Three times a day (TID) | ORAL | Status: DC
  Administered 2018-10-21 – 2018-10-25 (×11): 300 mg via ORAL
  Filled 2018-10-20 (×11): qty 1

## 2018-10-20 MED ORDER — LIDOCAINE VISCOUS HCL 2 % MT SOLN
15.0000 mL | Freq: Four times a day (QID) | OROMUCOSAL | Status: DC | PRN
  Administered 2018-10-23 (×2): 15 mL via OROMUCOSAL
  Filled 2018-10-20 (×2): qty 15

## 2018-10-20 MED ORDER — ONDANSETRON HCL 4 MG PO TABS: 4.0000 mg | ORAL_TABLET | Freq: Four times a day (QID) | ORAL | Status: DC | PRN

## 2018-10-20 MED ORDER — DICLOFENAC SODIUM 1 % TD GEL: 2.0000 g | Freq: Every day | TRANSDERMAL | Status: DC | PRN

## 2018-10-20 MED ORDER — MAGNESIUM OXIDE 400 (241.3 MG) MG PO TABS
400.0000 mg | ORAL_TABLET | Freq: Once | ORAL | Status: AC
  Administered 2018-10-21: 400 mg via ORAL
  Filled 2018-10-20: qty 1

## 2018-10-20 MED ORDER — CLOPIDOGREL BISULFATE 75 MG PO TABS
75.0000 mg | ORAL_TABLET | Freq: Every day | ORAL | Status: DC
  Administered 2018-10-21 – 2018-10-25 (×3): 75 mg via ORAL
  Filled 2018-10-20 (×3): qty 1

## 2018-10-20 NOTE — ED Provider Notes (Signed)
[facility] DEPT Provider Note   CSN: [id number] Arrival date & time: 10/20/18  1753     History   Chief Complaint Chief Complaint  Patient presents with  . Abdominal Pain  . Diarrhea  . Emesis    HPI Faith [name] is a 67 y.o. female.  HPI Patient presents with nausea vomiting diarrhea and abdominal pain for around the last 3 weeks.  Has stage IV lung cancer and is on immunotherapy.  States that everything she eats goes through her.  No diarrhea.  Does have rectal pain.  Also has had some vomiting.  Is had some weight loss.  Decreased oral intake.  Really is not that hungry either.  Feels fatigued. Past Medical History:  Diagnosis Date  . Anxiety   . Arthritis    "thighs; legs; hips" (07/05/2014)  . Bipolar disorder ([place])   . Cholelithiasis 07/2014  . Chronic bronchitis ([place])    "get it q yr"  . Chronic lower back pain   . Depression    pt. use to go to depression clinic, states she still has depression & anxiety but can't get to appt. so she hasn't had any med. for it in a while   . Dyspnea   . GERD (gastroesophageal reflux disease)   . High cholesterol   . History of radiation therapy 02/01/18- 02/12/18   Right Neck/ 30 Gy in 10 fractions.   . Hypertension   . Mass in neck 12/2017   RIGHT SIDE OF NECK  . Peripheral vascular disease ([place])   . Schizophrenia ([place])   . Stroke [facility])     Patient Active Problem List   Diagnosis Date Noted  . Port-A-Cath in place 04/28/2018  . Encounter for antineoplastic chemotherapy 04/21/2018  . Encounter for antineoplastic immunotherapy 04/21/2018  . Non-small cell carcinoma of right lung, stage 4 ([place]) 02/23/2018  . 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 Hyperkalemia 12/23/2017  . HLD (hyperlipidemia) 12/22/2017  . GERD (gastroesophageal reflux disease) 12/22/2017  . Stroke (cerebrum) ([place]) 12/22/2017  . Tobacco abuse 12/22/2017  .  Mass in neck 12/22/2017  . Hypokalemia 12/22/2017  . Hypercalcemia 12/22/2017  . Nausea vomiting and diarrhea 12/22/2017  . AKI (acute kidney injury) ([place]) 12/22/2017  . Stroke-like symptom 02/14/2017  . Low potassium syndrome 02/14/2017  . PAD (peripheral artery disease) ([place]) 01/15/2017  . Nonhealing nonsurgical wound 12/05/2016  . Essential hypertension 07/06/2014  . Depression 07/06/2014  . Back pain 07/06/2014  . Chronic cholecystitis 07/05/2014  . Right sided abdominal pain 05/11/2014  . Cholelithiasis 05/11/2014    Past Surgical History:  Procedure Laterality Date  . ABDOMINAL AORTOGRAM N/A 01/07/2017   Procedure: Abdominal Aortogram;  Surgeon: Waynetta Sandy, MD;  Location: [place] [facility];  Service: Cardiovascular;  Laterality: N/A;  . ABDOMINAL HYSTERECTOMY    . ANKLE FRACTURE SURGERY Right   . ANKLE HARDWARE REMOVAL Right   . APPENDECTOMY  ~ 1963  . BREAST BIOPSY Bilateral   . BREAST CYST EXCISION Bilateral    "not cancer"  . CATARACT EXTRACTION W/ INTRAOCULAR LENS  IMPLANT, BILATERAL    . CHOLECYSTECTOMY N/A 07/05/2014   Procedure: LAPAROSCOPIC CHOLECYSTECTOMY WITH INTRAOPERATIVE CHOLANGIOGRAM;  Surgeon: Gayland Curry, MD;  Location: [place] [facility];  Service: General;  Laterality: N/A;  . EXCISIONAL HEMORRHOIDECTOMY    . FEMORAL-POPLITEAL BYPASS GRAFT Right 01/15/2017   Procedure: BYPASS GRAFT FEMORAL-POPLITEAL ARTERY RIGHT LEG;  Surgeon: Georgia Dom  Donzetta Matters, MD;  Location: [place];  Service: Vascular;  Laterality: Right;  . IR IMAGING GUIDED PORT INSERTION  03/08/2018  . IR US GUIDE VASC ACCESS LEFT  03/08/2018  . LAPAROSCOPIC CHOLECYSTECTOMY  07/05/2014  . LOWER EXTREMITY ANGIOGRAPHY Bilateral 01/07/2017   Procedure: Lower Extremity Angiography;  Surgeon: Waynetta Sandy, MD;  Location: [place] [facility];  Service: Cardiovascular;  Laterality: Bilateral;  . MASS BIOPSY Right 12/25/2017   Procedure: INCISIONAL RIGHT NECK MASS BIOPSY;  Surgeon: Helayne Seminole, MD;  Location: [place];  Service: ENT;  Laterality: Right;  . MULTIPLE TOOTH EXTRACTIONS  05/2013   "pulled my upper teeth"  . PILONIDAL CYST EXCISION       OB History   None      Home Medications    Prior to Admission medications   Medication Sig Start Date End Date Taking? Authorizing Provider  acetaminophen (TYLENOL) 325 MG tablet Take 2 tablets (650 mg total) by mouth every 6 (six) hours as needed for mild pain (or Fever >/= 101). 12/25/17   Rosita Fire, MD  albuterol (PROVENTIL HFA;VENTOLIN HFA) 108 (90 Base) MCG/ACT inhaler Inhale 1-2 puffs into the lungs every 6 (six) hours as needed for wheezing or shortness of breath.    [provider]  albuterol (PROVENTIL) (2.5 MG/3ML) 0.083% nebulizer solution Take 2.5 mg by nebulization every 6 (six) hours as needed for wheezing or shortness of breath.    [provider]  AMITIZA 24 MCG capsule Take 24 mcg by mouth 2 (two) times daily. 12/07/17   [provider]  clopidogrel (PLAVIX) 75 MG tablet Take 1 tablet (75 mg total) by mouth daily. 02/19/17   Hongalgi, Lenis Dickinson, MD  diclofenac sodium (VOLTAREN) 1 % GEL Apply 2 g topically daily as needed (pain).     [provider]  diphenhydrAMINE (BENADRYL) 25 MG tablet Take 2 tablets (50 mg total) by mouth at bedtime as needed for sleep. 09/11/15   Evelina Bucy, MD  fluticasone (FLONASE) 50 MCG/ACT nasal spray Place 2 sprays into both nostrils daily.  09/10/15   [provider]  furosemide (LASIX) 20 MG tablet Take 20 mg by mouth daily as needed for fluid or edema.     [provider]  gabapentin (NEURONTIN) 300 MG capsule Take 300 mg by mouth 3 (three) times daily.     [provider]  ibuprofen (ADVIL,MOTRIN) 800 MG tablet Take 3 tablets by mouth 3 (three) times daily. 06/15/18   [provider]  lidocaine (XYLOCAINE) 2 % solution Caregiver: Mix 1part 2% viscous lidocaine, 1part H20. Swish & swallow 95mL of  diluted mixture, 38min before meals and at bedtime, up to QID 02/12/18   Eppie Gibson, MD  lidocaine-prilocaine (EMLA) cream Apply 1 application topically as needed. 09/22/18   Curt Bears, MD  Linaclotide [facility]) 290 MCG CAPS capsule Take 290-580 mcg by mouth daily as needed (constipation).     [provider]  loratadine (CLARITIN) 10 MG tablet Take 10 mg by mouth daily.    [provider]  methocarbamol (ROBAXIN) 750 MG tablet Take 750 mg by mouth 2 (two) times daily as needed for muscle spasms.     [provider]  mometasone (ELOCON) 0.1 % ointment Apply 1 application topically daily as needed (irritation).     [provider]  nicotine polacrilex (NICORETTE) 2 MG gum Take 2 mg by mouth as needed for smoking cessation.    [provider]  omeprazole (PRILOSEC) 20  MG capsule Take 20 mg by mouth 2 (two) times daily before a meal.    [provider]  Oxycodone HCl 20 MG TABS Take 1 tablet by mouth 4 (four) times daily as needed. 02/03/18   [provider]  oxyCODONE-acetaminophen (PERCOCET) 10-325 MG tablet Take 1 tablet by mouth every 6 (six) hours as needed for pain. 01/16/17   Virgina Jock A, PA-C  polyethylene glycol (MIRALAX / GLYCOLAX) packet Take 17 g by mouth daily. 02/11/18   Gery Pray, MD  potassium chloride SA (K-DUR,KLOR-CON) 20 MEQ tablet TAKE 2 TABLETS AS A SINGLE DOSE 07/01/18   Curt Bears, MD  prochlorperazine (COMPAZINE) 10 MG tablet Take 1 tablet (10 mg total) by mouth every 6 (six) hours as needed for nausea or vomiting. 02/22/18   Eppie Gibson, MD  SANTYL ointment APPLY  AA QD 04/19/18   [provider]  Vitamin D, Ergocalciferol, (DRISDOL) 50000 units CAPS capsule Take 50,000 Units by mouth every 7 (seven) days.    [provider]    Family History Family History  Problem Relation Age of Onset  . Cancer Mother 82       colon  . Diabetes Mother   . Hypertension Mother   . Cancer  Father   . Diabetes Father   . Hypertension Father     Social History Social History   Tobacco Use  . Smoking status: Former Smoker    Packs/day: 1.00    Years: 48.00    Pack years: 48.00    Types: Cigarettes    Last attempt to quit: 01/28/2018    Years since quitting: 0.7  . Smokeless tobacco: Never Used  . Tobacco comment: Occasional.   Substance Use Topics  . Alcohol use: No    Frequency: Never  . Drug use: No    Comment: 15-20 yrs. ago- used marijuana      Allergies   Penicillins; Codeine; and Percocet [oxycodone-acetaminophen]   Review of Systems Review of Systems  Constitutional: Positive for appetite change and fatigue. Negative for fever.  HENT: Negative for congestion.   Respiratory: Positive for shortness of breath.   Gastrointestinal: Negative for abdominal pain.  Genitourinary: Negative for flank pain.  Musculoskeletal: Negative for back pain.  Skin: Negative for rash.  Neurological: Negative for weakness and light-headedness.  Psychiatric/Behavioral: Negative for confusion.     Physical Exam Updated Vital Signs BP (!) 153/79 (BP Location: Left Arm)   Pulse 86   Temp 98.2 F (36.8 C) (Oral)   Resp 16   Ht 5\' 2"  (1.575 m)   Wt 53.1 kg   SpO2 100%   BMI 21.40 kg/m   Physical Exam  Constitutional: She appears well-developed.  HENT:  Head: Normocephalic.  Eyes: EOM are normal.  Cardiovascular:  Mild tachycardia  Pulmonary/Chest: Effort normal.  Abdominal:  Distention with mild tenderness.  No hernia palpated.  Neurological: She is alert.  Skin: Skin is warm. Capillary refill takes less than 2 seconds.  Rectal exam showed very firm stool in rectal vault.  Somewhat large but does have some soft stool around it.  However the mass itself was too firm to disimpact.  No soft tissue tenderness.   ED Treatments / Results  Labs (all labs ordered are listed, but only abnormal results are displayed) Labs Reviewed  COMPREHENSIVE METABOLIC PANEL -  Abnormal; Notable for the following components:      Result Value   Potassium 2.9 (*)    Glucose, Bld 121 (*)  Calcium 10.9 (*)    Total Protein 8.8 (*)    All other components within normal limits  GASTROINTESTINAL PANEL BY PCR, STOOL (REPLACES STOOL CULTURE)  LIPASE, BLOOD  CBC  URINALYSIS, ROUTINE W REFLEX MICROSCOPIC    EKG None  Radiology Dg Abdomen Acute W/chest  Result Date: 10/20/2018 CLINICAL DATA:  Nausea, vomiting, and generalized abdominal pain for 3 weeks. History of stage IV lung cancer, last treatment for weeks ago. EXAM: DG ABDOMEN ACUTE W/ 1V CHEST COMPARISON:  CT chest abdomen and pelvis 09/21/2018 FINDINGS: Power port type central venous catheter with tip over the low SVC region. No pneumothorax. Borderline heart size with normal pulmonary vascularity. Volume loss in the right middle lung consistent with known postobstructive change. Prominence of the right hilum corresponding to known neoplasm. Left lung is clear. No blunting of costophrenic angles. No pneumothorax. Degenerative changes in the spine and shoulders. Scattered gas in the colon and small bowel. No small or large bowel distention. Air-fluid levels are in the colon. This may indicate diarrhea process or liquid stool. Stool and residual contrast material in the rectosigmoid colon. No free intra-abdominal air. Surgical clips in the right upper quadrant and right pelvis. Vascular calcifications. Degenerative changes in the spine and hips. IMPRESSION: 1. Volume loss in the right middle lung consistent with known neoplasm. 2. Air-fluid levels in the colon without distention may indicate liquid stool. No evidence of bowel obstruction. Electronically Signed   By: Lucienne Capers M.D.   On: 10/20/2018 21:55    Procedures Procedures (including critical care time)  Medications Ordered in ED Medications  HYDROmorphone (DILAUDID) injection 0.5 mg (has no administration in time range)  sodium chloride 0.9 % bolus  1,000 mL (0 mLs Intravenous Stopped 10/20/18 2227)     Initial Impression / Assessment and Plan / ED Course  I have reviewed the triage vital signs and the nursing notes.  Pertinent labs & imaging results that were available during my care of the patient were reviewed by me and considered in my medical decision making (see chart for details).     Patient with abdominal pain some nausea vomiting and rectal pain with attempted bowel movements.  Has been having some diarrhea but also difficulty having bowel movement.  Some distention but x-ray only shows diarrhea.  There is a very hard stool in the vault that I could not disimpact.  I think patient would benefit with her comorbidities and decreased oral intake and hypokalemia from admission to hospital for bowel protocol and potassium supplementation.  Had no intra-abdominal involvement of her cancer 1 month ago.  Final Clinical Impressions(s) / ED Diagnoses   Final diagnoses:  Fecal impaction ([place])  Hypokalemia  Metastatic cancer [facility])    ED Discharge Orders    None       Davonna Belling, MD 10/20/18 2249

## 2018-10-20 NOTE — ED Notes (Signed)
Pt used bedpan but only to have BM.

## 2018-10-20 NOTE — ED Triage Notes (Signed)
Patient BIB daughter, c/o N/V/D and abdominal pain x3 weeks. Hx stage 4 lung cancer. Last treatment x4 weeks ago.

## 2018-10-20 NOTE — ED Notes (Signed)
Bed: SI73 Expected date:  Expected time:  Means of arrival:  Comments: [place]

## 2018-10-20 NOTE — H&P (Signed)
History and Physical    Faith [name] PPJ:[id number] DOB: July 28, 1951 DOA: 10/20/2018  PCP: [name], MD   Patient coming from: Home   Chief Complaint: Rectal pain, difficulty moving bowels for 1-2 months and more recent loose stools   HPI: Faith [name] is a 67 y.o. 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  Patient reports difficulty moving her bowels for more than a month, has had associated pain in the rectum, and over the past couple weeks, has been experiencing some loose stools and nausea with nonbloody vomiting, but continued rectal discomfort.  She denies any melena or hematochezia, denies fevers or chills, and denies chest pain or shortness of breath.  ED Course: Upon arrival to the ED, patient is found to be afebrile, saturating well on room air, and with vitals otherwise stable.  Chemistry panel is notable for potassium of 2.9 and calcium 10.9.  CBC is unremarkable.  Chest x-ray features volume loss in the right middle lobe consistent with the known neoplasm.  KUB without obstruction.  Patient was given a liter of normal saline, 10 mEq IV potassium, and Dilaudid in the ED and ED physician performed DRE, encountering hard stool in the rectum that could not be broken up manually.  Enema has been ordered by the ED physician. She remains hemodynamically stable and will be observed for ongoing evaluation and management of fecal impaction.     Review of Systems:  All other systems reviewed and apart from HPI, are negative.  Past Medical History:  Diagnosis Date  . Anxiety   . Arthritis    "thighs; legs; hips" (07/05/2014)  . Bipolar disorder ([place])   . Cholelithiasis 07/2014  . Chronic bronchitis ([place])    "get it q yr"  . Chronic lower back pain   .  Depression    pt. use to go to depression clinic, states she still has depression & anxiety but can't get to appt. so she hasn't had any med. for it in a while   . Dyspnea   . GERD (gastroesophageal reflux disease)   . High cholesterol   . History of radiation therapy 02/01/18- 02/12/18   Right Neck/ 30 Gy in 10 fractions.   . Hypertension   . Mass in neck 12/2017   RIGHT SIDE OF NECK  . Peripheral vascular disease ([place])   . Schizophrenia ([place])   . Stroke [facility])     Past Surgical History:  Procedure Laterality Date  . ABDOMINAL AORTOGRAM N/A 01/07/2017   Procedure: Abdominal Aortogram;  Surgeon: Waynetta Sandy, MD;  Location: [place] [facility];  Service: Cardiovascular;  Laterality: N/A;  . ABDOMINAL HYSTERECTOMY    . ANKLE FRACTURE SURGERY Right   . ANKLE HARDWARE REMOVAL Right   . APPENDECTOMY  ~ 1963  . BREAST BIOPSY Bilateral   . BREAST CYST EXCISION Bilateral    "not cancer"  . CATARACT EXTRACTION W/ INTRAOCULAR LENS  IMPLANT, BILATERAL    . CHOLECYSTECTOMY N/A 07/05/2014   Procedure: LAPAROSCOPIC CHOLECYSTECTOMY WITH INTRAOPERATIVE CHOLANGIOGRAM;  Surgeon: Gayland Curry, MD;  Location: [place];  Service: General;  Laterality: N/A;  . EXCISIONAL HEMORRHOIDECTOMY    . FEMORAL-POPLITEAL BYPASS GRAFT Right 01/15/2017   Procedure: BYPASS GRAFT FEMORAL-POPLITEAL ARTERY RIGHT LEG;  Surgeon: Waynetta Sandy, MD;  Location[facility]  OR;  Service: Vascular;  Laterality: Right;  . IR IMAGING GUIDED PORT INSERTION  03/08/2018  . IR US GUIDE VASC ACCESS LEFT  03/08/2018  . LAPAROSCOPIC CHOLECYSTECTOMY  07/05/2014  . LOWER EXTREMITY ANGIOGRAPHY Bilateral 01/07/2017   Procedure: Lower Extremity Angiography;  Surgeon: Waynetta Sandy, MD;  Location: [place] [facility];  Service: Cardiovascular;  Laterality: Bilateral;  . MASS BIOPSY Right 12/25/2017   Procedure: INCISIONAL RIGHT NECK MASS BIOPSY;  Surgeon: Helayne Seminole, MD;  Location: [place];  Service: ENT;  Laterality: Right;    . MULTIPLE TOOTH EXTRACTIONS  05/2013   "pulled my upper teeth"  . PILONIDAL CYST EXCISION       reports that she quit smoking about 8 months ago. Her smoking use included cigarettes. She has a 48.00 pack-year smoking history. She has never used smokeless tobacco. She reports that she does not drink alcohol or use drugs.  Allergies  Allergen Reactions  . Penicillins Itching and Other (See Comments)    Causes yeast infections  PATIENT HAS HAD A PCN REACTION WITH IMMEDIATE RASH, FACIAL/TONGUE/THROAT SWELLING, SOB, OR LIGHTHEADEDNESS WITH HYPOTENSION:  #  #  #  YES  #  #  #   Has patient had a PCN reaction causing severe rash involving mucus membranes or skin necrosis: Unk Has patient had a PCN reaction that required hospitalization: No Has patient had a PCN reaction occurring within the last 10 years: #  #  #  YES  #  #  #  If all of the above answers are "NO", then may proceed with Cephalosporin   . Codeine Itching and Other (See Comments)    And yeast infections  . Percocet [Oxycodone-Acetaminophen] Itching    Tolerates with Benadryl    Family History  Problem Relation Age of Onset  . Cancer Mother 45       colon  . Diabetes Mother   . Hypertension Mother   . Cancer Father   . Diabetes Father   . Hypertension Father      Prior to Admission medications   Medication Sig Start Date End Date Taking? Authorizing Provider  albuterol (PROVENTIL HFA;VENTOLIN HFA) 108 (90 Base) MCG/ACT inhaler Inhale 1-2 puffs into the lungs every 6 (six) hours as needed for wheezing or shortness of breath.   Yes [provider]  albuterol (PROVENTIL) (2.5 MG/3ML) 0.083% nebulizer solution Take 2.5 mg by nebulization every 6 (six) hours as needed for wheezing or shortness of breath.   Yes [provider]  clopidogrel (PLAVIX) 75 MG tablet Take 1 tablet (75 mg total) by mouth daily. 02/19/17  Yes Hongalgi, Lenis Dickinson, MD  diclofenac sodium (VOLTAREN) 1 % GEL Apply 2 g topically daily as  needed (pain).    Yes [provider]  diphenhydrAMINE (BENADRYL) 25 MG tablet Take 2 tablets (50 mg total) by mouth at bedtime as needed for sleep. 09/11/15  Yes Evelina Bucy, MD  fluticasone (FLONASE) 50 MCG/ACT nasal spray Place 1 spray into both nostrils daily.  09/10/15  Yes [provider]  furosemide (LASIX) 20 MG tablet Take 20 mg by mouth daily as needed for fluid or edema.    Yes [provider]  gabapentin (NEURONTIN) 300 MG capsule Take 300 mg by mouth 3 (three) times daily.    Yes [provider]  lidocaine (XYLOCAINE) 2 % solution Caregiver: Mix 1part 2% viscous lidocaine, 1part H20. Swish & swallow 34mL of diluted mixture, 107min before meals and at bedtime, up to  QID 02/12/18  Yes Eppie Gibson, MD  lidocaine-prilocaine (EMLA) cream Apply 1 application topically as needed. 09/22/18  Yes Curt Bears, MD  Linaclotide [facility]) 290 MCG CAPS capsule Take 290-580 mcg by mouth daily as needed (constipation).    Yes [provider]  Oxycodone HCl 20 MG TABS Take 1 tablet by mouth 4 (four) times daily as needed (pain).  02/03/18  Yes [provider]  polyethylene glycol (MIRALAX / GLYCOLAX) packet Take 17 g by mouth daily. Patient taking differently: Take 17 g by mouth daily as needed for mild constipation.  02/11/18  Yes Gery Pray, MD  potassium chloride 20 MEQ/15ML (10%) SOLN Take 30 mLs by mouth daily. 10/07/18  Yes [provider]  acetaminophen (TYLENOL) 325 MG tablet Take 2 tablets (650 mg total) by mouth every 6 (six) hours as needed for mild pain (or Fever >/= 101). Patient not taking: Reported on 10/20/2018 12/25/17   Rosita Fire, MD  loratadine (CLARITIN) 10 MG tablet Take 10 mg by mouth daily.    [provider]  methocarbamol (ROBAXIN) 750 MG tablet Take 750 mg by mouth 2 (two) times daily as needed for muscle spasms.     [provider]  mometasone (ELOCON) 0.1 % ointment Apply 1  application topically daily as needed (irritation).     [provider]  nicotine polacrilex (NICORETTE) 2 MG gum Take 2 mg by mouth as needed for smoking cessation.    [provider]  omeprazole (PRILOSEC) 20 MG capsule Take 20 mg by mouth 2 (two) times daily before a meal.    [provider]  oxyCODONE-acetaminophen (PERCOCET) 10-325 MG tablet Take 1 tablet by mouth every 6 (six) hours as needed for pain. 01/16/17   Alvia Grove, PA-C  potassium chloride SA (K-DUR,KLOR-CON) 20 MEQ tablet TAKE 2 TABLETS AS A SINGLE DOSE Patient not taking: Reported on 10/20/2018 07/01/18   Curt Bears, MD  prochlorperazine (COMPAZINE) 10 MG tablet Take 1 tablet (10 mg total) by mouth every 6 (six) hours as needed for nausea or vomiting. 02/22/18   Eppie Gibson, MD  SANTYL ointment APPLY  AA QD 04/19/18   [provider]  Vitamin D, Ergocalciferol, (DRISDOL) 50000 units CAPS capsule Take 50,000 Units by mouth every 7 (seven) days.    [provider]    Physical Exam: Vitals:   10/20/18 1802 10/20/18 2016 10/20/18 2118 10/20/18 2333  BP: 133/80  (!) 153/79 (!) 150/80  Pulse: (!) 109  86 88  Resp: 17  16 16   Temp: 98.2 F (36.8 C)   98.3 F (36.8 C)  TempSrc: Oral   Oral  SpO2: 93% 94% 100% 100%  Weight: 53.1 kg     Height: 5\' 2"  (1.575 m)       Constitutional: NAD, calm  Eyes: PERTLA, lids and conjunctivae normal ENMT: Mucous membranes are moist. Posterior pharynx clear of any exudate or lesions.   Neck: normal, supple, no masses, no thyromegaly Respiratory: clear to auscultation bilaterally, no wheezing, no crackles. Normal respiratory effort.    Cardiovascular: S1 & S2 heard, regular rate and rhythm. No extremity edema.  Abdomen: soft, non-distended, mild tenderness in mid and lower abdomen without rebound pain or guarding. Bowel sounds active.  Musculoskeletal: no clubbing / cyanosis. No joint deformity upper and lower extremities.    Skin: no  significant rashes, lesions, ulcers. Warm, dry, well-perfused. Neurologic: Dysarthria. Dysconjugate gaze. Sensation intact. Moving all extremities.  Psychiatric: Alert and oriented x 3. Pleasant and  cooperative.    Labs on Admission: I have personally reviewed following labs and imaging studies  CBC: Recent Labs  Lab 10/20/18 1807  WBC 9.5  HGB 14.4  HCT 43.0  MCV 86.7  PLT 403   Basic Metabolic Panel: Recent Labs  Lab 10/20/18 1807  NA 138  K 2.9*  CL 104  CO2 22  GLUCOSE 121*  BUN 19  CREATININE 0.67  CALCIUM 10.9*   GFR: Estimated Creatinine Clearance: 54.7 mL/min (by C-G formula based on SCr of 0.67 mg/dL). Liver Function Tests: Recent Labs  Lab 10/20/18 1807  AST 21  ALT 12  ALKPHOS 92  BILITOT 1.1  PROT 8.8*  ALBUMIN 4.9   Recent Labs  Lab 10/20/18 1807  LIPASE 21   No results for input(s): AMMONIA in the last 168 hours. Coagulation Profile: No results for input(s): INR, PROTIME in the last 168 hours. Cardiac Enzymes: No results for input(s): CKTOTAL, CKMB, CKMBINDEX, TROPONINI in the last 168 hours. BNP (last 3 results) No results for input(s): PROBNP in the last 8760 hours. HbA1C: No results for input(s): HGBA1C in the last 72 hours. CBG: No results for input(s): GLUCAP in the last 168 hours. Lipid Profile: No results for input(s): CHOL, HDL, LDLCALC, TRIG, CHOLHDL, LDLDIRECT in the last 72 hours. Thyroid Function Tests: No results for input(s): TSH, T4TOTAL, FREET4, T3FREE, THYROIDAB in the last 72 hours. Anemia Panel: No results for input(s): VITAMINB12, FOLATE, FERRITIN, TIBC, IRON, RETICCTPCT in the last 72 hours. Urine analysis:    Component Value Date/Time   COLORURINE STRAW (A) 01/13/2017 1213   APPEARANCEUR CLEAR 01/13/2017 1213   LABSPEC 1.009 01/13/2017 1213   PHURINE 6.0 01/13/2017 1213   GLUCOSEU NEGATIVE 01/13/2017 1213   HGBUR NEGATIVE 01/13/2017 [place] 01/13/2017 1213   KETONESUR NEGATIVE 01/13/2017  1213   PROTEINUR NEGATIVE 01/13/2017 1213   UROBILINOGEN 0.2 09/11/2015 1300   NITRITE NEGATIVE 01/13/2017 1213   LEUKOCYTESUR NEGATIVE 01/13/2017 1213   Sepsis Labs: @LABRCNTIP (procalcitonin:4,lacticidven:4) )No results found for this or any previous visit (from the past 240 hour(s)).   Radiological Exams on Admission: Dg Abdomen Acute W/chest  Result Date: 10/20/2018 CLINICAL DATA:  Nausea, vomiting, and generalized abdominal pain for 3 weeks. History of stage IV lung cancer, last treatment for weeks ago. EXAM: DG ABDOMEN ACUTE W/ 1V CHEST COMPARISON:  CT chest abdomen and pelvis 09/21/2018 FINDINGS: Power port type central venous catheter with tip over the low SVC region. No pneumothorax. Borderline heart size with normal pulmonary vascularity. Volume loss in the right middle lung consistent with known postobstructive change. Prominence of the right hilum corresponding to known neoplasm. Left lung is clear. No blunting of costophrenic angles. No pneumothorax. Degenerative changes in the spine and shoulders. Scattered gas in the colon and small bowel. No small or large bowel distention. Air-fluid levels are in the colon. This may indicate diarrhea process or liquid stool. Stool and residual contrast material in the rectosigmoid colon. No free intra-abdominal air. Surgical clips in the right upper quadrant and right pelvis. Vascular calcifications. Degenerative changes in the spine and hips. IMPRESSION: 1. Volume loss in the right middle lung consistent with known neoplasm. 2. Air-fluid levels in the colon without distention may indicate liquid stool. No evidence of bowel obstruction. Electronically Signed   By: Lucienne Capers M.D.   On: 10/20/2018 21:55    EKG: Not performed.   Assessment/Plan  1. Fecal impaction  - Presents with 2 months of rectal pain and difficulty moving  bowels, with more recent N/V and loose stools   - ED physician encountered very hard stool in the rectum that could  not be broken up manually  - She is receiving a soap suds enema and pain-control  - There is mild abdominal tenderness but remains soft with no peritoneal signs  - Plan for enema, potassium repletion, continued-pain control, and may need GI eval if fails to respond to conservative therapies    2. Hypokalemia  - Serum potassium is 2.9 on admission  - Treated in ED with 10 mEq IV potassium  - Continue her daily 40 mEq oral supplement, give additional 20 mEq IV potassium and empiric mag, repeat chem panel in am   3. Cancer  - Follows with oncology for stage IV carcinoma with large necrotic right lower neck mass and lung, mediastinal and hilar node involvement  - She is s/p palliative radiotherapy and systemic chemotherapy, now on Keytruda only   - She will continue oncology follow-up   4. Chronic pain  - Continue home-regimen with Voltaren gel, as-needed oxycodone, Robaxin, and gabapentin   5. History of CVA   - Continue Plavix     DVT prophylaxis: Lovenox  Code Status: Full  Family Communication: Discussed with patient  Consults called: None Admission status: Observation     Vianne Bulls, MD [facility] Pager [phone]  If 7PM-7AM, please contact night-coverage [email or web address] Password [facility]  10/20/2018, 11:46 PM

## 2018-10-21 ENCOUNTER — Inpatient Hospital Stay (HOSPITAL_COMMUNITY): Payer: Medicare Other

## 2018-10-21 ENCOUNTER — Encounter (HOSPITAL_COMMUNITY): Payer: Self-pay | Admitting: Radiology

## 2018-10-21 ENCOUNTER — Other Ambulatory Visit: Payer: Self-pay

## 2018-10-21 DIAGNOSIS — E876 Hypokalemia: Secondary | ICD-10-CM | POA: Diagnosis present

## 2018-10-21 DIAGNOSIS — E860 Dehydration: Secondary | ICD-10-CM | POA: Diagnosis present

## 2018-10-21 DIAGNOSIS — Z888 Allergy status to other drugs, medicaments and biological substances status: Secondary | ICD-10-CM | POA: Diagnosis not present

## 2018-10-21 DIAGNOSIS — Z923 Personal history of irradiation: Secondary | ICD-10-CM | POA: Diagnosis not present

## 2018-10-21 DIAGNOSIS — J189 Pneumonia, unspecified organism: Secondary | ICD-10-CM | POA: Diagnosis present

## 2018-10-21 DIAGNOSIS — J9811 Atelectasis: Secondary | ICD-10-CM | POA: Diagnosis present

## 2018-10-21 DIAGNOSIS — Z9221 Personal history of antineoplastic chemotherapy: Secondary | ICD-10-CM | POA: Diagnosis not present

## 2018-10-21 DIAGNOSIS — G8929 Other chronic pain: Secondary | ICD-10-CM | POA: Diagnosis present

## 2018-10-21 DIAGNOSIS — F419 Anxiety disorder, unspecified: Secondary | ICD-10-CM | POA: Diagnosis present

## 2018-10-21 DIAGNOSIS — Z8673 Personal history of transient ischemic attack (TIA), and cerebral infarction without residual deficits: Secondary | ICD-10-CM | POA: Diagnosis not present

## 2018-10-21 DIAGNOSIS — Z885 Allergy status to narcotic agent status: Secondary | ICD-10-CM | POA: Diagnosis not present

## 2018-10-21 DIAGNOSIS — K5641 Fecal impaction: Secondary | ICD-10-CM | POA: Diagnosis present

## 2018-10-21 DIAGNOSIS — B370 Candidal stomatitis: Secondary | ICD-10-CM | POA: Diagnosis present

## 2018-10-21 DIAGNOSIS — C3491 Malignant neoplasm of unspecified part of right bronchus or lung: Secondary | ICD-10-CM | POA: Diagnosis present

## 2018-10-21 DIAGNOSIS — I493 Ventricular premature depolarization: Secondary | ICD-10-CM | POA: Diagnosis present

## 2018-10-21 DIAGNOSIS — Z880 Allergy status to penicillin: Secondary | ICD-10-CM | POA: Diagnosis not present

## 2018-10-21 DIAGNOSIS — E872 Acidosis: Secondary | ICD-10-CM | POA: Diagnosis present

## 2018-10-21 LAB — CBC WITH DIFFERENTIAL/PLATELET
ABS IMMATURE GRANULOCYTES: 0.04 10*3/uL (ref 0.00–0.07)
Basophils Absolute: 0 10*3/uL (ref 0.0–0.1)
Basophils Relative: 0 %
EOS PCT: 1 %
Eosinophils Absolute: 0.1 10*3/uL (ref 0.0–0.5)
HEMATOCRIT: 38.9 % (ref 36.0–46.0)
HEMOGLOBIN: 12.9 g/dL (ref 12.0–15.0)
Immature Granulocytes: 0 %
LYMPHS ABS: 2.1 10*3/uL (ref 0.7–4.0)
LYMPHS PCT: 21 %
MCH: 29.1 pg (ref 26.0–34.0)
MCHC: 33.2 g/dL (ref 30.0–36.0)
MCV: 87.8 fL (ref 80.0–100.0)
MONO ABS: 0.7 10*3/uL (ref 0.1–1.0)
MONOS PCT: 7 %
NEUTROS ABS: 7.4 10*3/uL (ref 1.7–7.7)
Neutrophils Relative %: 71 %
Platelets: 194 10*3/uL (ref 150–400)
RBC: 4.43 MIL/uL (ref 3.87–5.11)
RDW: 14.6 % (ref 11.5–15.5)
WBC: 10.4 10*3/uL (ref 4.0–10.5)
nRBC: 0 % (ref 0.0–0.2)

## 2018-10-21 LAB — GASTROINTESTINAL PANEL BY PCR, STOOL (REPLACES STOOL CULTURE)
ADENOVIRUS F40/41: NOT DETECTED
Astrovirus: NOT DETECTED
Campylobacter species: NOT DETECTED
Cryptosporidium: NOT DETECTED
Cyclospora cayetanensis: NOT DETECTED
ENTAMOEBA HISTOLYTICA: NOT DETECTED
ENTEROTOXIGENIC E COLI (ETEC): NOT DETECTED
Enteroaggregative E coli (EAEC): NOT DETECTED
Enteropathogenic E coli (EPEC): NOT DETECTED
GIARDIA LAMBLIA: NOT DETECTED
NOROVIRUS GI/GII: NOT DETECTED
Plesimonas shigelloides: NOT DETECTED
Rotavirus A: NOT DETECTED
Salmonella species: NOT DETECTED
Sapovirus (I, II, IV, and V): NOT DETECTED
Shiga like toxin producing E coli (STEC): NOT DETECTED
Shigella/Enteroinvasive E coli (EIEC): NOT DETECTED
VIBRIO SPECIES: NOT DETECTED
Vibrio cholerae: NOT DETECTED
YERSINIA ENTEROCOLITICA: NOT DETECTED

## 2018-10-21 LAB — BASIC METABOLIC PANEL
Anion gap: 10 (ref 5–15)
BUN: 12 mg/dL (ref 8–23)
CO2: 21 mmol/L — AB (ref 22–32)
CREATININE: 0.51 mg/dL (ref 0.44–1.00)
Calcium: 9.2 mg/dL (ref 8.9–10.3)
Chloride: 107 mmol/L (ref 98–111)
GFR calc Af Amer: >60 mL/min (ref >60)
GFR calc non Af Amer: >60 mL/min (ref >60)
GLUCOSE: 118 mg/dL — AB (ref 70–99)
Potassium: 3.6 mmol/L (ref 3.5–5.1)
SODIUM: 138 mmol/L (ref 135–145)

## 2018-10-21 MED ORDER — SODIUM CHLORIDE 0.9 % IV SOLN
INTRAVENOUS | Status: DC
  Administered 2018-10-21 – 2018-10-24 (×8): via INTRAVENOUS

## 2018-10-21 MED ORDER — NYSTATIN 100000 UNIT/ML MT SUSP
5.0000 mL | Freq: Four times a day (QID) | OROMUCOSAL | Status: DC
  Administered 2018-10-21 – 2018-10-25 (×11): 500000 [IU] via ORAL
  Filled 2018-10-21 (×14): qty 5

## 2018-10-21 MED ORDER — IOHEXOL 300 MG/ML  SOLN
30.0000 mL | Freq: Once | INTRAMUSCULAR | Status: AC | PRN
  Administered 2018-10-21: 30 mL via ORAL

## 2018-10-21 MED ORDER — SORBITOL 70 % SOLN
960.0000 mL | TOPICAL_OIL | Freq: Once | ORAL | Status: AC
  Administered 2018-10-21: 960 mL via RECTAL
  Filled 2018-10-21: qty 473

## 2018-10-21 MED ORDER — SORBITOL 70 % SOLN
30.0000 mL | Freq: Once | Status: AC
  Administered 2018-10-21: 30 mL via ORAL
  Filled 2018-10-21: qty 30

## 2018-10-21 MED ORDER — IOHEXOL 300 MG/ML  SOLN
100.0000 mL | Freq: Once | INTRAMUSCULAR | Status: AC | PRN
  Administered 2018-10-21: 100 mL via INTRAVENOUS

## 2018-10-21 MED ORDER — SODIUM CHLORIDE (PF) 0.9 % IJ SOLN
INTRAMUSCULAR | Status: AC
  Filled 2018-10-21: qty 50

## 2018-10-21 MED ORDER — PEG 3350-KCL-NA BICARB-NACL 420 G PO SOLR
4000.0000 mL | Freq: Once | ORAL | Status: AC
  Administered 2018-10-21: 4000 mL via ORAL
  Filled 2018-10-21: qty 4000

## 2018-10-21 MED ORDER — MORPHINE SULFATE (PF) 2 MG/ML IV SOLN
1.0000 mg | INTRAVENOUS | Status: DC | PRN
  Administered 2018-10-21 – 2018-10-22 (×8): 1 mg via INTRAVENOUS
  Filled 2018-10-21 (×8): qty 1

## 2018-10-21 NOTE — Progress Notes (Signed)
PROGRESS NOTE    Faith [name]  HQP:[id number] DOB: 1951/03/13 DOA: 10/20/2018 PCP: [name], MD    Brief Narrative: 67 year old with past medical history significant for stroke, chronic pain, stage IV carcinoma of the lung and right neck who presents complaining of rectal pain, difficulty passing stool. She also reports a history of some loose stool. ED physician performed a rectal exam and patient was noted to be impacted. Manual  desimpaction was not successful. KUB without obstruction.   Assessment & Plan:   Principal Problem:   Fecal impaction in rectum [facility]) Active Problems:   History of stroke   Hypokalemia   Non-small cell carcinoma of right lung, stage 4 (HCC)   Fecal impaction of rectum (HCC)   Chronic pain   1-Rectal pain, abdominal pain; fecal impaction;  Patient seen in the ED, she is very uncomfortable, she has only had very small stool. She has received an enema. Will order SMOG enema and GoLYTELY. Is no significant bowel movement will need to consult GI. IV morphine for pain.she was in excruciating pain. If pain doesn't improve she will need a CT abdomen.  2-Hypokalemia; replete  3-Stage IV carcinoma with large necrotic right lower neck mass and lung with mediastinal and hilar nodes involvement She is status post palliative radiation and systemic chemotherapy. She is now on Keytruda.   Dehydration, metabolic acidosis.  IV fluids.  Poor oral intake.   Oral thrush; start nystatin.   Chronic pain; continue with Voltaren gel, Oxycodone.   History of CVA; Continue with plavix.    RN Pressure Injury Documentation:    Malnutrition Type:      Malnutrition Characteristics:      Nutrition Interventions:     Estimated body mass index is 21.4 kg/m as calculated from the following:   Height as of this encounter: 5\' 2"  (1.575 m).   Weight as of this encounter: 53.1 kg.   DVT prophylaxis: Lovenox Code Status: full code Family  Communication: no family at bedside Disposition Plan: remaining inpatient for management of fecal impaction, IV fluids for poor oral intake  Consultants:   none   Procedures:   none  Antimicrobials: (none  Subjective: Is in severe pain moving a lot in the bed, she is complaining of abdominal pain. Has only had very small stool after enema  Objective: Vitals:   10/21/18 0617 10/21/18 0653 10/21/18 1309 10/21/18 1422  BP: (!) 145/79 (!) 158/78 (!) 142/65 (!) 144/61  Pulse: 75 69 73 69  Resp: 16 16 16 16   Temp: 98.6 F (37 C) 98.4 F (36.9 C)  98.5 F (36.9 C)  TempSrc: Oral Oral    SpO2: 97% 97% 100% 100%  Weight:      Height:       No intake or output data in the 24 hours ending 10/21/18 1541 Filed Weights   10/20/18 1802  Weight: 53.1 kg    Examination:  General exam: . Uncomfortable crying. Mucous membranes very dry Respiratory system: Clear to auscultation. Respiratory effort normal. Cardiovascular system: S1 & S2 heard, RRR. No JVD, murmurs, rubs, gallops or clicks. No pedal edema. Gastrointestinal system: soft tender in the lower quadrants no rigidity Central nervous system: Alert and oriented. No focal neurological deficits. Extremities: Symmetric 5 x 5 power. Skin: No rashes, lesions or ulcers   Data Reviewed: I have personally reviewed following labs and imaging studies  CBC: Recent Labs  Lab 10/20/18 1807 10/21/18 0500  WBC 9.5 10.4  NEUTROABS  --  7.4  HGB 14.4 12.9  HCT 43.0 38.9  MCV 86.7 87.8  PLT 230 161   Basic Metabolic Panel: Recent Labs  Lab 10/20/18 1807 10/21/18 0500  NA 138 138  K 2.9* 3.6  CL 104 107  CO2 22 21*  GLUCOSE 121* 118*  BUN 19 12  CREATININE 0.67 0.51  CALCIUM 10.9* 9.2   GFR: Estimated Creatinine Clearance: 54.7 mL/min (by C-G formula based on SCr of 0.51 mg/dL). Liver Function Tests: Recent Labs  Lab 10/20/18 1807  AST 21  ALT 12  ALKPHOS 92  BILITOT 1.1  PROT 8.8*  ALBUMIN 4.9   Recent Labs    Lab 10/20/18 1807  LIPASE 21   No results for input(s): AMMONIA in the last 168 hours. Coagulation Profile: No results for input(s): INR, PROTIME in the last 168 hours. Cardiac Enzymes: No results for input(s): CKTOTAL, CKMB, CKMBINDEX, TROPONINI in the last 168 hours. BNP (last 3 results) No results for input(s): PROBNP in the last 8760 hours. HbA1C: No results for input(s): HGBA1C in the last 72 hours. CBG: No results for input(s): GLUCAP in the last 168 hours. Lipid Profile: No results for input(s): CHOL, HDL, LDLCALC, TRIG, CHOLHDL, LDLDIRECT in the last 72 hours. Thyroid Function Tests: No results for input(s): TSH, T4TOTAL, FREET4, T3FREE, THYROIDAB in the last 72 hours. Anemia Panel: No results for input(s): VITAMINB12, FOLATE, FERRITIN, TIBC, IRON, RETICCTPCT in the last 72 hours. Sepsis Labs: No results for input(s): PROCALCITON, LATICACIDVEN in the last 168 hours.  Recent Results (from the past 240 hour(s))  Gastrointestinal Panel by PCR , Stool     Status: None   Collection Time: 10/20/18  7:32 PM  Result Value Ref Range Status   Campylobacter species NOT DETECTED NOT DETECTED Final   Plesimonas shigelloides NOT DETECTED NOT DETECTED Final   Salmonella species NOT DETECTED NOT DETECTED Final   Yersinia enterocolitica NOT DETECTED NOT DETECTED Final   Vibrio species NOT DETECTED NOT DETECTED Final   Vibrio cholerae NOT DETECTED NOT DETECTED Final   Enteroaggregative E coli (EAEC) NOT DETECTED NOT DETECTED Final   Enteropathogenic E coli (EPEC) NOT DETECTED NOT DETECTED Final   Enterotoxigenic E coli (ETEC) NOT DETECTED NOT DETECTED Final   Shiga like toxin producing E coli (STEC) NOT DETECTED NOT DETECTED Final   Shigella/Enteroinvasive E coli (EIEC) NOT DETECTED NOT DETECTED Final   Cryptosporidium NOT DETECTED NOT DETECTED Final   Cyclospora cayetanensis NOT DETECTED NOT DETECTED Final   Entamoeba histolytica NOT DETECTED NOT DETECTED Final   Giardia lamblia  NOT DETECTED NOT DETECTED Final   Adenovirus F40/41 NOT DETECTED NOT DETECTED Final   Astrovirus NOT DETECTED NOT DETECTED Final   Norovirus GI/GII NOT DETECTED NOT DETECTED Final   Rotavirus A NOT DETECTED NOT DETECTED Final   Sapovirus (I, II, IV, and V) NOT DETECTED NOT DETECTED Final    Comment: Performed at [facility], [address] [place], [place] [address]         Radiology Studies: Dg Abdomen Acute W/chest  Result Date: 10/20/2018 CLINICAL DATA:  Nausea, vomiting, and generalized abdominal pain for 3 weeks. History of stage IV lung cancer, last treatment for weeks ago. EXAM: DG ABDOMEN ACUTE W/ 1V CHEST COMPARISON:  CT chest abdomen and pelvis 09/21/2018 FINDINGS: Power port type central venous catheter with tip over the low SVC region. No pneumothorax. Borderline heart size with normal pulmonary vascularity. Volume loss in the right middle lung consistent with known postobstructive change. Prominence of the right hilum  corresponding to known neoplasm. Left lung is clear. No blunting of costophrenic angles. No pneumothorax. Degenerative changes in the spine and shoulders. Scattered gas in the colon and small bowel. No small or large bowel distention. Air-fluid levels are in the colon. This may indicate diarrhea process or liquid stool. Stool and residual contrast material in the rectosigmoid colon. No free intra-abdominal air. Surgical clips in the right upper quadrant and right pelvis. Vascular calcifications. Degenerative changes in the spine and hips. IMPRESSION: 1. Volume loss in the right middle lung consistent with known neoplasm. 2. Air-fluid levels in the colon without distention may indicate liquid stool. No evidence of bowel obstruction. Electronically Signed   By: Lucienne Capers M.D.   On: 10/20/2018 21:55        Scheduled Meds: . clopidogrel  75 mg Oral Daily  . enoxaparin (LOVENOX) injection  40 mg Subcutaneous Q24H  . gabapentin  300 mg Oral TID  .  nystatin  5 mL Oral QID  . pantoprazole  40 mg Oral Daily  . potassium chloride  40 mEq Oral Daily  . sodium chloride flush  3 mL Intravenous Q12H   Continuous Infusions: . sodium chloride    . sodium chloride 100 mL/hr at 10/21/18 1426     LOS: 0 days    Time spent: 35 minutes    Elmarie Shiley, MD [facility] Pager [phone]  If 7PM-7AM, please contact night-coverage [email or web address] Password [facility] 10/21/2018, 3:41 PM

## 2018-10-21 NOTE — ED Notes (Signed)
Pt told to drink more of her laxative.  It is at the bedside, poured in cup and flavored for her

## 2018-10-21 NOTE — ED Notes (Signed)
ED TO INPATIENT HANDOFF REPORT  Name/Age/Gender Faith [name] 67 y.o. female  Code Status    Code Status Orders  (From admission, onward)         Start     Ordered   10/20/18 2344  Full code  Continuous     10/20/18 2345        Code Status History    Date Active Date Inactive Code Status Order ID Comments User Context   12/22/2017 2351 12/25/2017 1958 Full Code [id number]  Ivor Costa, MD ED   02/14/2017 0741 02/18/2017 2028 Full Code [id number]  Elwin Mocha, MD ED   01/15/2017 1614 01/19/2017 1738 Full Code [id number]  Waynetta Sandy, MD Inpatient   01/07/2017 0949 01/07/2017 1710 Full Code [id number]  Waynetta Sandy, MD Inpatient   07/05/2014 1118 07/06/2014 1444 Full Code [id number]  Greer Pickerel, MD Inpatient      Home/SNF/Other Home  Chief Complaint Stomach Pain;Chemo Card   Level of Care/Admitting Diagnosis ED Disposition    ED Disposition Condition Comment   [place] [facility]: [facility] [[id number]]  Level of Care: Med-Surg [16]  Diagnosis: Fecal impaction of rectum [facility]) [[id number]]  Admitting Physician: Vianne Bulls [[id number]]  Attending Physician: Vianne Bulls [[id number]]  PT Class (Do Not Modify): Observation [104]  PT Acc Code (Do Not Modify): Observation [10022]       Medical History Past Medical History:  Diagnosis Date  . Anxiety   . Arthritis    "thighs; legs; hips" (07/05/2014)  . Bipolar disorder ([place])   . Cholelithiasis 07/2014  . Chronic bronchitis ([place])    "get it q yr"  . Chronic lower back pain   . Depression    pt. use to go to depression clinic, states she still has depression & anxiety but can't get to appt. so she hasn't had any med. for it in a while   . Dyspnea   . GERD (gastroesophageal reflux disease)   . High cholesterol   . History of radiation therapy 02/01/18- 02/12/18   Right Neck/ 30 Gy in 10 fractions.   . Hypertension   . Mass in neck 12/2017   RIGHT SIDE OF NECK  . Peripheral vascular  disease ([place])   . Schizophrenia ([place])   . Stroke [facility])     Allergies Allergies  Allergen Reactions  . Penicillins Itching and Other (See Comments)    Causes yeast infections  PATIENT HAS HAD A PCN REACTION WITH IMMEDIATE RASH, FACIAL/TONGUE/THROAT SWELLING, SOB, OR LIGHTHEADEDNESS WITH HYPOTENSION:  #  #  #  YES  #  #  #   Has patient had a PCN reaction causing severe rash involving mucus membranes or skin necrosis: Unk Has patient had a PCN reaction that required hospitalization: No Has patient had a PCN reaction occurring within the last 10 years: #  #  #  YES  #  #  #  If all of the above answers are "NO", then may proceed with Cephalosporin   . Codeine Itching and Other (See Comments)    And yeast infections  . Percocet [Oxycodone-Acetaminophen] Itching    Tolerates with Benadryl    IV Location/Drains/Wounds Patient Lines/Drains/Airways Status   Active Line/Drains/Airways    Name:   Placement date:   Placement time:   Site:   Days:   Implanted Port 03/08/18 Right Chest   03/08/18    1543    Chest   227  Labs/Imaging Results for orders placed or performed during the hospital encounter of 10/20/18 (from the past 48 hour(s))  Lipase, blood     Status: None   Collection Time: 10/20/18  6:07 PM  Result Value Ref Range   Lipase 21 11 - 51 U/L    Comment: Performed at [facility], [place] [address], [place], [place] [address]  Comprehensive metabolic panel     Status: Abnormal   Collection Time: 10/20/18  6:07 PM  Result Value Ref Range   Sodium 138 135 - 145 mmol/L   Potassium 2.9 (L) 3.5 - 5.1 mmol/L   Chloride 104 98 - 111 mmol/L   CO2 22 22 - 32 mmol/L   Glucose, Bld 121 (H) 70 - 99 mg/dL   BUN 19 8 - 23 mg/dL   Creatinine, Ser 0.67 0.44 - 1.00 mg/dL   Calcium 10.9 (H) 8.9 - 10.3 mg/dL   Total Protein 8.8 (H) 6.5 - 8.1 g/dL   Albumin 4.9 3.5 - 5.0 g/dL   AST 21 15 - 41 U/L   ALT 12 0 - 44 U/L   Alkaline Phosphatase 92 38 - 126 U/L    Total Bilirubin 1.1 0.3 - 1.2 mg/dL   GFR calc non Af Amer >60 >60 mL/min   GFR calc Af Amer >60 >60 mL/min    Comment: (NOTE) The eGFR has been calculated using the CKD EPI equation. This calculation has not been validated in all clinical situations. eGFR's persistently <60 mL/min signify possible Chronic Kidney Disease.    Anion gap 12 5 - 15    Comment: Performed at [facility], [place] [address], [place], [place] [address]  CBC     Status: None   Collection Time: 10/20/18  6:07 PM  Result Value Ref Range   WBC 9.5 4.0 - 10.5 K/uL   RBC 4.96 3.87 - 5.11 MIL/uL   Hemoglobin 14.4 12.0 - 15.0 g/dL   HCT 43.0 36.0 - 46.0 %   MCV 86.7 80.0 - 100.0 fL   MCH 29.0 26.0 - 34.0 pg   MCHC 33.5 30.0 - 36.0 g/dL   RDW 14.6 11.5 - 15.5 %   Platelets 230 150 - 400 K/uL   nRBC 0.0 0.0 - 0.2 %    Comment: Performed at [facility], [place] [address] [place], [place] [address]  Basic metabolic panel     Status: Abnormal   Collection Time: 10/21/18  5:00 AM  Result Value Ref Range   Sodium 138 135 - 145 mmol/L   Potassium 3.6 3.5 - 5.1 mmol/L    Comment: DELTA CHECK NOTED REPEATED TO VERIFY NO VISIBLE HEMOLYSIS    Chloride 107 98 - 111 mmol/L   CO2 21 (L) 22 - 32 mmol/L   Glucose, Bld 118 (H) 70 - 99 mg/dL   BUN 12 8 - 23 mg/dL   Creatinine, Ser 0.51 0.44 - 1.00 mg/dL   Calcium 9.2 8.9 - 10.3 mg/dL   GFR calc non Af Amer >60 >60 mL/min   GFR calc Af Amer >60 >60 mL/min    Comment: (NOTE) The eGFR has been calculated using the CKD EPI equation. This calculation has not been validated in all clinical situations. eGFR's persistently <60 mL/min signify possible Chronic Kidney Disease.    Anion gap 10 5 - 15    Comment: Performed at [facility], [place] [address], [place], [place] [address]  CBC WITH DIFFERENTIAL     Status:  None   Collection Time: 10/21/18  5:00 AM  Result Value Ref Range   WBC 10.4 4.0 - 10.5 K/uL   RBC 4.43 3.87 -  5.11 MIL/uL   Hemoglobin 12.9 12.0 - 15.0 g/dL   HCT 38.9 36.0 - 46.0 %   MCV 87.8 80.0 - 100.0 fL   MCH 29.1 26.0 - 34.0 pg   MCHC 33.2 30.0 - 36.0 g/dL   RDW 14.6 11.5 - 15.5 %   Platelets 194 150 - 400 K/uL   nRBC 0.0 0.0 - 0.2 %   Neutrophils Relative % 71 %   Neutro Abs 7.4 1.7 - 7.7 K/uL   Lymphocytes Relative 21 %   Lymphs Abs 2.1 0.7 - 4.0 K/uL   Monocytes Relative 7 %   Monocytes Absolute 0.7 0.1 - 1.0 K/uL   Eosinophils Relative 1 %   Eosinophils Absolute 0.1 0.0 - 0.5 K/uL   Basophils Relative 0 %   Basophils Absolute 0.0 0.0 - 0.1 K/uL   Immature Granulocytes 0 %   Abs Immature Granulocytes 0.04 0.00 - 0.07 K/uL    Comment: Performed at [facility], [place] [address] [place], [place] [address]   Dg Abdomen Acute W/chest  Result Date: 10/20/2018 CLINICAL DATA:  Nausea, vomiting, and generalized abdominal pain for 3 weeks. History of stage IV lung cancer, last treatment for weeks ago. EXAM: DG ABDOMEN ACUTE W/ 1V CHEST COMPARISON:  CT chest abdomen and pelvis 09/21/2018 FINDINGS: Power port type central venous catheter with tip over the low SVC region. No pneumothorax. Borderline heart size with normal pulmonary vascularity. Volume loss in the right middle lung consistent with known postobstructive change. Prominence of the right hilum corresponding to known neoplasm. Left lung is clear. No blunting of costophrenic angles. No pneumothorax. Degenerative changes in the spine and shoulders. Scattered gas in the colon and small bowel. No small or large bowel distention. Air-fluid levels are in the colon. This may indicate diarrhea process or liquid stool. Stool and residual contrast material in the rectosigmoid colon. No free intra-abdominal air. Surgical clips in the right upper quadrant and right pelvis. Vascular calcifications. Degenerative changes in the spine and hips. IMPRESSION: 1. Volume loss in the right middle lung consistent with known neoplasm. 2.  Air-fluid levels in the colon without distention may indicate liquid stool. No evidence of bowel obstruction. Electronically Signed   By: Lucienne Capers M.D.   On: 10/20/2018 21:55    Pending Labs Unresulted Labs (From admission, onward)    Start     Ordered   10/27/18 0500  Creatinine, serum  (enoxaparin (LOVENOX)    CrCl >/= 30 ml/min)  Weekly,   R    Comments:  while on enoxaparin therapy    10/20/18 2345   10/21/18 0500  HIV antibody (Routine Testing)  Tomorrow morning,   R     10/20/18 2345   10/20/18 1932  Gastrointestinal Panel by PCR , Stool  (Gastrointestinal Panel by PCR, Stool)  Once,   R     10/20/18 1932          Vitals/Pain Today's Vitals   10/21/18 0339 10/21/18 0617 10/21/18 0653 10/21/18 0707  BP: (!) 167/99 (!) 145/79 (!) 158/78   Pulse: 69 75 69   Resp: '16 16 16   ' Temp: 98.4 F (36.9 C) 98.6 F (37 C) 98.4 F (36.9 C)   TempSrc: Oral Oral Oral   SpO2: 97% 97% 97%   Weight:  Height:      PainSc: '4  7  7  7     ' Isolation Precautions No active isolations  Medications Medications  oxyCODONE (Oxy IR/ROXICODONE) immediate release tablet 20 mg (20 mg Oral Given 10/21/18 1116)  nicotine polacrilex (NICORETTE) gum 2 mg (has no administration in time range)  pantoprazole (PROTONIX) EC tablet 40 mg (40 mg Oral Given 10/21/18 1107)  clopidogrel (PLAVIX) tablet 75 mg (75 mg Oral Given 10/21/18 1107)  gabapentin (NEURONTIN) capsule 300 mg (300 mg Oral Given 10/21/18 1107)  methocarbamol (ROBAXIN) tablet 750 mg (has no administration in time range)  potassium chloride 20 MEQ/15ML (10%) solution 40 mEq (40 mEq Oral Given 10/21/18 1108)  albuterol (PROVENTIL) (2.5 MG/3ML) 0.083% nebulizer solution 2.5 mg (has no administration in time range)  diclofenac sodium (VOLTAREN) 1 % transdermal gel 2 g (has no administration in time range)  lidocaine (XYLOCAINE) 2 % viscous mouth solution 15 mL (has no administration in time range)  enoxaparin (LOVENOX) injection 40  mg (40 mg Subcutaneous Not Given 10/21/18 1224)  sodium chloride flush (NS) 0.9 % injection 3 mL (3 mLs Intravenous Given 10/21/18 0012)  sodium chloride flush (NS) 0.9 % injection 3 mL (has no administration in time range)  0.9 %  sodium chloride infusion (has no administration in time range)  acetaminophen (TYLENOL) tablet 650 mg (has no administration in time range)    Or  acetaminophen (TYLENOL) suppository 650 mg (has no administration in time range)  ondansetron (ZOFRAN) tablet 4 mg (has no administration in time range)    Or  ondansetron (ZOFRAN) injection 4 mg (has no administration in time range)  mineral oil enema 1 enema (has no administration in time range)  potassium chloride 10 mEq in 100 mL IVPB (0 mEq Intravenous Stopped 10/21/18 0107)  morphine 2 MG/ML injection 1 mg (1 mg Intravenous Given 10/21/18 0901)  0.9 %  sodium chloride infusion ( Intravenous New Bag/Given 10/21/18 1139)  nystatin (MYCOSTATIN) 100000 UNIT/ML suspension 500,000 Units (500,000 Units Oral Not Given 10/21/18 1224)  sodium chloride 0.9 % bolus 1,000 mL (0 mLs Intravenous Stopped 10/20/18 2227)  HYDROmorphone (DILAUDID) injection 0.5 mg (0.5 mg Intravenous Given 10/20/18 2255)  potassium chloride 10 mEq in 100 mL IVPB (0 mEq Intravenous Stopped 10/20/18 2349)  magnesium oxide (MAG-OX) tablet 400 mg (400 mg Oral Given 10/21/18 0010)  sorbitol, milk of mag, mineral oil, glycerin (SMOG) enema (960 mLs Rectal Given 10/21/18 1131)  polyethylene glycol-electrolytes (NuLYTELY/GoLYTELY) solution 4,000 mL (4,000 mLs Oral Given 10/21/18 1131)    Mobility walks

## 2018-10-21 NOTE — ED Notes (Signed)
Pt sitting on BSC attempting to have a BM

## 2018-10-22 LAB — BASIC METABOLIC PANEL
Anion gap: 6 (ref 5–15)
BUN: 6 mg/dL — AB (ref 8–23)
CHLORIDE: 109 mmol/L (ref 98–111)
CO2: 24 mmol/L (ref 22–32)
Calcium: 8.8 mg/dL — ABNORMAL LOW (ref 8.9–10.3)
Creatinine, Ser: 0.47 mg/dL (ref 0.44–1.00)
GFR calc Af Amer: >60 mL/min (ref >60)
GFR calc non Af Amer: >60 mL/min (ref >60)
GLUCOSE: 98 mg/dL (ref 70–99)
Potassium: 3.8 mmol/L (ref 3.5–5.1)
SODIUM: 139 mmol/L (ref 135–145)

## 2018-10-22 LAB — CBC
HCT: 36.5 % (ref 36.0–46.0)
HEMOGLOBIN: 12.3 g/dL (ref 12.0–15.0)
MCH: 29.6 pg (ref 26.0–34.0)
MCHC: 33.7 g/dL (ref 30.0–36.0)
MCV: 88 fL (ref 80.0–100.0)
Platelets: 179 10*3/uL (ref 150–400)
RBC: 4.15 MIL/uL (ref 3.87–5.11)
RDW: 14.6 % (ref 11.5–15.5)
WBC: 9.6 10*3/uL (ref 4.0–10.5)
nRBC: 0 % (ref 0.0–0.2)

## 2018-10-22 LAB — HIV ANTIBODY (ROUTINE TESTING W REFLEX): HIV SCREEN 4TH GENERATION: NONREACTIVE

## 2018-10-22 MED ORDER — MORPHINE SULFATE (PF) 2 MG/ML IV SOLN: 2.0000 mg | INTRAVENOUS | Status: DC | PRN

## 2018-10-22 MED ORDER — GLYCERIN (LAXATIVE) 2.1 G RE SUPP
6.0000 | Freq: Once | RECTAL | Status: AC
  Administered 2018-10-22: 6 via RECTAL
  Filled 2018-10-22: qty 6

## 2018-10-22 MED ORDER — LIDOCAINE VISCOUS HCL 2 % MT SOLN: 15.0000 mL | Freq: Once | OROMUCOSAL | Status: DC

## 2018-10-22 MED ORDER — MORPHINE SULFATE (PF) 2 MG/ML IV SOLN
1.0000 mg | Freq: Once | INTRAVENOUS | Status: AC
  Administered 2018-10-22: 1 mg via INTRAVENOUS
  Filled 2018-10-22: qty 1

## 2018-10-22 MED ORDER — SODIUM CHLORIDE 0.9% FLUSH: 10.0000 mL | INTRAVENOUS | Status: DC | PRN

## 2018-10-22 MED ORDER — LIDOCAINE HCL URETHRAL/MUCOSAL 2 % EX GEL
1.0000 "application " | Freq: Once | CUTANEOUS | Status: AC
  Administered 2018-10-22: 1 via TOPICAL
  Filled 2018-10-22: qty 5

## 2018-10-22 MED ORDER — LIP MEDEX EX OINT
TOPICAL_OINTMENT | CUTANEOUS | Status: AC
  Administered 2018-10-22: 12:00:00
  Filled 2018-10-22: qty 7

## 2018-10-22 MED ORDER — HYDROMORPHONE HCL 1 MG/ML IJ SOLN
1.0000 mg | INTRAMUSCULAR | Status: DC | PRN
  Administered 2018-10-22 (×2): 2 mg via INTRAVENOUS
  Administered 2018-10-22: 1 mg via INTRAVENOUS
  Administered 2018-10-23 (×4): 2 mg via INTRAVENOUS
  Filled 2018-10-22 (×7): qty 2

## 2018-10-22 NOTE — Progress Notes (Signed)
PROGRESS NOTE    Annaliesa A Martinique  OEV:[id number] DOB: February 20, 1951 DOA: 10/20/2018 PCP: [name], MD    Brief Narrative: 67 year old with past medical history significant for stroke, chronic pain, stage IV carcinoma of the lung and right neck who presents complaining of rectal pain, difficulty passing stool. She also reports a history of some loose stool. ED physician performed a rectal exam and patient was noted to be impacted. Manual  desimpaction was not successful. KUB without obstruction. Patient has not had a significant bowel movement, CT abdomen showed  a stool ball in the rectum measuring 7.3 by 4.9 by 6.2 cm. GI was consulted, Dr. [name] evaluated the patient and performed a rectal exam. He is recommending surgery consultation. I have consulted surgery.   Assessment & Plan:   Principal Problem:   Fecal impaction in rectum [facility]) Active Problems:   History of stroke   Hypokalemia   Non-small cell carcinoma of right lung, stage 4 (HCC)   Fecal impaction of rectum (HCC)   Chronic pain   1-Rectal pain, abdominal pain; fecal impaction;  And has received multiple enema, SMOG, [place]. No Bowel movement.   IV morphine for pain.she was in excruciating pain. CT abdomen showed stool ball.  Evaluated by Dr [name] who recommend surgical consultation. . I have consulted Surgery.   2-Hypokalemia; replete  3-Stage IV carcinoma with large necrotic right lower neck mass and lung with mediastinal and hilar nodes involvement She is status post palliative radiation and systemic chemotherapy. She is now on Keytruda.   Dehydration, metabolic acidosis.  IV fluids.  Poor oral intake.   Oral thrush; start nystatin.   Chronic pain; continue with Voltaren gel, Oxycodone.   History of CVA; Continue with plavix.    RN Pressure Injury Documentation:    Malnutrition Type:      Malnutrition Characteristics:      Nutrition Interventions:     Estimated body mass index is 21.4  kg/m as calculated from the following:   Height as of this encounter: 5\' 2"  (1.575 m).   Weight as of this encounter: 53.1 kg.   DVT prophylaxis: Lovenox Code Status: full code Family Communication: no family at bedside Disposition Plan: remaining inpatient for management of fecal impaction, IV fluids for poor oral intake  Consultants:   GI  Surgery    Procedures:   none  Antimicrobials: (none  Subjective: She is in a lot of pain. She is refusing further enema, it is too painful.    Objective: Vitals:   10/21/18 1811 10/21/18 2001 10/22/18 0524 10/22/18 1410  BP:  101/67 115/69 128/66  Pulse:  61 64 63  Resp:  20 16 15   Temp:  98.4 F (36.9 C) 98.8 F (37.1 C) 99.2 F (37.3 C)  TempSrc:  Oral Oral Oral  SpO2: 98% 92% 98% 99%  Weight:      Height:        Intake/Output Summary (Last 24 hours) at 10/22/2018 1455 Last data filed at 10/22/2018 8299 Gross per 24 hour  Intake 658.2 ml  Output -  Net 658.2 ml   Filed Weights   10/20/18 1802  Weight: 53.1 kg    Examination:  General exam: Crying, in pain Respiratory system: clear to auscultation normal respiratory effort Cardiovascular system: S1 and S2 regular rhythm and rate Gastrointestinal system: bowel sounds soft mildly distended, no rigidity  Central nervous system: nonfocal Extremities: symmetric power.  Skin: no rashes or lesion   Data Reviewed: I have personally reviewed following  labs and imaging studies  CBC: Recent Labs  Lab 10/20/18 1807 10/21/18 0500 10/22/18 0811  WBC 9.5 10.4 9.6  NEUTROABS  --  7.4  --   HGB 14.4 12.9 12.3  HCT 43.0 38.9 36.5  MCV 86.7 87.8 88.0  PLT 230 194 630   Basic Metabolic Panel: Recent Labs  Lab 10/20/18 1807 10/21/18 0500 10/22/18 0811  NA 138 138 139  K 2.9* 3.6 3.8  CL 104 107 109  CO2 22 21* 24  GLUCOSE 121* 118* 98  BUN 19 12 6*  CREATININE 0.67 0.51 0.47  CALCIUM 10.9* 9.2 8.8*   GFR: Estimated Creatinine Clearance: 54.7 mL/min  (by C-G formula based on SCr of 0.47 mg/dL). Liver Function Tests: Recent Labs  Lab 10/20/18 1807  AST 21  ALT 12  ALKPHOS 92  BILITOT 1.1  PROT 8.8*  ALBUMIN 4.9   Recent Labs  Lab 10/20/18 1807  LIPASE 21   No results for input(s): AMMONIA in the last 168 hours. Coagulation Profile: No results for input(s): INR, PROTIME in the last 168 hours. Cardiac Enzymes: No results for input(s): CKTOTAL, CKMB, CKMBINDEX, TROPONINI in the last 168 hours. BNP (last 3 results) No results for input(s): PROBNP in the last 8760 hours. HbA1C: No results for input(s): HGBA1C in the last 72 hours. CBG: No results for input(s): GLUCAP in the last 168 hours. Lipid Profile: No results for input(s): CHOL, HDL, LDLCALC, TRIG, CHOLHDL, LDLDIRECT in the last 72 hours. Thyroid Function Tests: No results for input(s): TSH, T4TOTAL, FREET4, T3FREE, THYROIDAB in the last 72 hours. Anemia Panel: No results for input(s): VITAMINB12, FOLATE, FERRITIN, TIBC, IRON, RETICCTPCT in the last 72 hours. Sepsis Labs: No results for input(s): PROCALCITON, LATICACIDVEN in the last 168 hours.  Recent Results (from the past 240 hour(s))  Gastrointestinal Panel by PCR , Stool     Status: None   Collection Time: 10/20/18  7:32 PM  Result Value Ref Range Status   Campylobacter species NOT DETECTED NOT DETECTED Final   Plesimonas shigelloides NOT DETECTED NOT DETECTED Final   Salmonella species NOT DETECTED NOT DETECTED Final   Yersinia enterocolitica NOT DETECTED NOT DETECTED Final   Vibrio species NOT DETECTED NOT DETECTED Final   Vibrio cholerae NOT DETECTED NOT DETECTED Final   Enteroaggregative E coli (EAEC) NOT DETECTED NOT DETECTED Final   Enteropathogenic E coli (EPEC) NOT DETECTED NOT DETECTED Final   Enterotoxigenic E coli (ETEC) NOT DETECTED NOT DETECTED Final   Shiga like toxin producing E coli (STEC) NOT DETECTED NOT DETECTED Final   Shigella/Enteroinvasive E coli (EIEC) NOT DETECTED NOT DETECTED Final    Cryptosporidium NOT DETECTED NOT DETECTED Final   Cyclospora cayetanensis NOT DETECTED NOT DETECTED Final   Entamoeba histolytica NOT DETECTED NOT DETECTED Final   Giardia lamblia NOT DETECTED NOT DETECTED Final   Adenovirus F40/41 NOT DETECTED NOT DETECTED Final   Astrovirus NOT DETECTED NOT DETECTED Final   Norovirus GI/GII NOT DETECTED NOT DETECTED Final   Rotavirus A NOT DETECTED NOT DETECTED Final   Sapovirus (I, II, IV, and V) NOT DETECTED NOT DETECTED Final    Comment: Performed at [facility], [address] [place], [place] [address]         Radiology Studies: Ct Abdomen Pelvis W Contrast  Result Date: 10/21/2018 CLINICAL DATA:  Rectal pain. EXAM: CT ABDOMEN AND PELVIS WITH CONTRAST TECHNIQUE: Multidetector CT imaging of the abdomen and pelvis was performed using the standard protocol following bolus administration of intravenous contrast.  CONTRAST:  156mL OMNIPAQUE IOHEXOL 300 MG/ML SOLN, 48mL OMNIPAQUE IOHEXOL 300 MG/ML SOLN COMPARISON:  September 21, 2018 FINDINGS: Lower chest: The patient's known malignancy is only partially imaged in the right lower chest on today's study. There has been no significant change of the visualized portions of the known malignancy and surrounding mild opacities since September 21, 2018. No other change to the lower chest. Hepatobiliary: Small cysts and low-attenuation lesions in the liver, unchanged since previous studies and considered benign. No suspicious liver masses. The portal vein is patent. The patient is status post cholecystectomy. Intra and extrahepatic biliary duct dilatation persistent is slightly more prominent in the interval. Pancreas: Unremarkable. No pancreatic ductal dilatation or surrounding inflammatory changes. Spleen: Normal in size without focal abnormality. Adrenals/Urinary Tract: The right renal cyst is again identified, unchanged. Kidneys, adrenal glands, ureters, and bladder are otherwise normal. Stomach/Bowel: The  stomach is normal. No small bowel obstruction identified. There is a stool ball in the rectum measuring 7.3 by 4.9 by 6.2 cm. There is rectal wall thickening and suspected subtle adjacent fat stranding. There is significant fecal loading in the colon from the cecum through the distal transverse colon. The patient is status post appendectomy. Vascular/Lymphatic: Atherosclerotic changes are seen in the nonaneurysmal aorta. No adenopathy. Reproductive: Status post hysterectomy. No adnexal masses. Other: No abdominal wall hernia or abnormality. No abdominopelvic ascites. Musculoskeletal: No acute bony changes. IMPRESSION: 1. The patient's symptoms are due to a large stool ball in the rectum which is resulting in mild rectal wall thickening and subtle adjacent fat stranding. The findings are concerning for early stercoral colitis . There is significant fecal loading more proximally in the colon as above. 2. The patient's known malignancy in the right lower chest is incompletely evaluated but grossly unchanged. 3. Atherosclerotic changes in the abdominal aorta. Electronically Signed   By: Dorise Bullion III M.D   On: 10/21/2018 22:12   Dg Abdomen Acute W/chest  Result Date: 10/20/2018 CLINICAL DATA:  Nausea, vomiting, and generalized abdominal pain for 3 weeks. History of stage IV lung cancer, last treatment for weeks ago. EXAM: DG ABDOMEN ACUTE W/ 1V CHEST COMPARISON:  CT chest abdomen and pelvis 09/21/2018 FINDINGS: Power port type central venous catheter with tip over the low SVC region. No pneumothorax. Borderline heart size with normal pulmonary vascularity. Volume loss in the right middle lung consistent with known postobstructive change. Prominence of the right hilum corresponding to known neoplasm. Left lung is clear. No blunting of costophrenic angles. No pneumothorax. Degenerative changes in the spine and shoulders. Scattered gas in the colon and small bowel. No small or large bowel distention. Air-fluid  levels are in the colon. This may indicate diarrhea process or liquid stool. Stool and residual contrast material in the rectosigmoid colon. No free intra-abdominal air. Surgical clips in the right upper quadrant and right pelvis. Vascular calcifications. Degenerative changes in the spine and hips. IMPRESSION: 1. Volume loss in the right middle lung consistent with known neoplasm. 2. Air-fluid levels in the colon without distention may indicate liquid stool. No evidence of bowel obstruction. Electronically Signed   By: Lucienne Capers M.D.   On: 10/20/2018 21:55        Scheduled Meds: . clopidogrel  75 mg Oral Daily  . enoxaparin (LOVENOX) injection  40 mg Subcutaneous Q24H  . gabapentin  300 mg Oral TID  . nystatin  5 mL Oral QID  . pantoprazole  40 mg Oral Daily  . potassium chloride  40 mEq Oral  Daily  . sodium chloride flush  3 mL Intravenous Q12H   Continuous Infusions: . sodium chloride    . sodium chloride 100 mL/hr at 10/22/18 0145     LOS: 1 day    Time spent: 35 minutes    Elmarie Shiley, MD [facility] Pager [phone]  If 7PM-7AM, please contact night-coverage [email or web address] Password [facility] 10/22/2018, 2:55 PM

## 2018-10-22 NOTE — Consult Note (Signed)
Patient seen and rectal exam performed.  6 glycerin suppositories ordered to try to soften the firm impaction.  She will need disimpaction in the OR and will discuss with the [facility] call team to schedule  Kaylyn Lim, MD, FACS

## 2018-10-22 NOTE — Consult Note (Signed)
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  She complained about proctalgia and she reported problems with bowel movements over the past mont.  Her last office visit was in 2015 and at that time she was being treated for her constipation. When she did take her Miralax and Ducolax regimen, she had reasonable stooling.  A CT scan of the ABM/Pelvis shows a 6 x 7 cm stool ball with adjacent rectal fat stranding.  Attempts by ER and Nursing failed to disimpact her.  She was provided with a SMOG enema without any improvement.    Past Medical History:  Diagnosis Date  . Anxiety   . Arthritis    "thighs; legs; hips" (07/05/2014)  . Bipolar disorder ([place])   . Cholelithiasis 07/2014  . Chronic bronchitis ([place])    "get it q yr"  . Chronic lower back pain   . Depression    pt. use to go to depression clinic, states she still has depression & anxiety but can't get to appt. so she hasn't had any med. for it in a while   . Dyspnea   . GERD (gastroesophageal reflux disease)   . High cholesterol   . History of radiation therapy 02/01/18- 02/12/18   Right Neck/ 30 Gy in 10 fractions.   . Hypertension   . Mass in neck 12/2017   RIGHT SIDE OF NECK  . Peripheral vascular disease ([place])   . Schizophrenia ([place])   . Stroke [facility])     Past Surgical History:  Procedure Laterality Date  . ABDOMINAL AORTOGRAM N/A 01/07/2017   Procedure: Abdominal Aortogram;  Surgeon: Waynetta Sandy, MD;  Location: [place] [facility];  Service: Cardiovascular;  Laterality: N/A;  . ABDOMINAL HYSTERECTOMY    . ANKLE FRACTURE SURGERY Right   . ANKLE HARDWARE REMOVAL Right   . APPENDECTOMY  ~ 1963  . BREAST BIOPSY Bilateral   . BREAST CYST EXCISION Bilateral    "not cancer"  . CATARACT EXTRACTION W/  INTRAOCULAR LENS  IMPLANT, BILATERAL    . CHOLECYSTECTOMY N/A 07/05/2014   Procedure: LAPAROSCOPIC CHOLECYSTECTOMY WITH INTRAOPERATIVE CHOLANGIOGRAM;  Surgeon: Gayland Curry, MD;  Location: [place];  Service: General;  Laterality: N/A;  . EXCISIONAL HEMORRHOIDECTOMY    . FEMORAL-POPLITEAL BYPASS GRAFT Right 01/15/2017   Procedure: BYPASS GRAFT FEMORAL-POPLITEAL ARTERY RIGHT LEG;  Surgeon: Waynetta Sandy, MD;  Location: [place];  Service: Vascular;  Laterality: Right;  . IR IMAGING GUIDED PORT INSERTION  03/08/2018  . IR US GUIDE VASC ACCESS LEFT  03/08/2018  . LAPAROSCOPIC CHOLECYSTECTOMY  07/05/2014  . LOWER EXTREMITY ANGIOGRAPHY Bilateral 01/07/2017   Procedure: Lower Extremity Angiography;  Surgeon: Waynetta Sandy, MD;  Location: [place] [facility];  Service: Cardiovascular;  Laterality: Bilateral;  . MASS BIOPSY Right 12/25/2017   Procedure: INCISIONAL RIGHT NECK MASS BIOPSY;  Surgeon: Helayne Seminole, MD;  Location: [place];  Service: ENT;  Laterality: Right;  . MULTIPLE TOOTH EXTRACTIONS  05/2013   "pulled my upper teeth"  . PILONIDAL CYST EXCISION      Family History  Problem Relation Age of Onset  . Cancer Mother 57       colon  . Diabetes Mother   . Hypertension Mother   . Cancer Father   . Diabetes Father   .  Hypertension Father     Social History:  reports that she quit smoking about 8 months ago. Her smoking use included cigarettes. She has a 48.00 pack-year smoking history. She has never used smokeless tobacco. She reports that she does not drink alcohol or use drugs.  Allergies:  Allergies  Allergen Reactions  . Penicillins Itching and Other (See Comments)    Causes yeast infections  PATIENT HAS HAD A PCN REACTION WITH IMMEDIATE RASH, FACIAL/TONGUE/THROAT SWELLING, SOB, OR LIGHTHEADEDNESS WITH HYPOTENSION:  #  #  #  YES  #  #  #   Has patient had a PCN reaction causing severe rash involving mucus membranes or skin necrosis: Unk Has patient had a PCN reaction  that required hospitalization: No Has patient had a PCN reaction occurring within the last 10 years: #  #  #  YES  #  #  #  If all of the above answers are "NO", then may proceed with Cephalosporin   . Codeine Itching and Other (See Comments)    And yeast infections  . Percocet [Oxycodone-Acetaminophen] Itching    Tolerates with Benadryl    Medications:  Scheduled: . clopidogrel  75 mg Oral Daily  . enoxaparin (LOVENOX) injection  40 mg Subcutaneous Q24H  . gabapentin  300 mg Oral TID  . nystatin  5 mL Oral QID  . pantoprazole  40 mg Oral Daily  . potassium chloride  40 mEq Oral Daily  . sodium chloride flush  3 mL Intravenous Q12H   Continuous: . sodium chloride    . sodium chloride 100 mL/hr at 10/22/18 0145    Results for orders placed or performed during the hospital encounter of 10/20/18 (from the past 24 hour(s))  CBC     Status: None   Collection Time: 10/22/18  8:11 AM  Result Value Ref Range   WBC 9.6 4.0 - 10.5 K/uL   RBC 4.15 3.87 - 5.11 MIL/uL   Hemoglobin 12.3 12.0 - 15.0 g/dL   HCT 36.5 36.0 - 46.0 %   MCV 88.0 80.0 - 100.0 fL   MCH 29.6 26.0 - 34.0 pg   MCHC 33.7 30.0 - 36.0 g/dL   RDW 14.6 11.5 - 15.5 %   Platelets 179 150 - 400 K/uL   nRBC 0.0 0.0 - 0.2 %  Basic metabolic panel     Status: Abnormal   Collection Time: 10/22/18  8:11 AM  Result Value Ref Range   Sodium 139 135 - 145 mmol/L   Potassium 3.8 3.5 - 5.1 mmol/L   Chloride 109 98 - 111 mmol/L   CO2 24 22 - 32 mmol/L   Glucose, Bld 98 70 - 99 mg/dL   BUN 6 (L) 8 - 23 mg/dL   Creatinine, Ser 0.47 0.44 - 1.00 mg/dL   Calcium 8.8 (L) 8.9 - 10.3 mg/dL   GFR calc non Af Amer >60 >60 mL/min   GFR calc Af Amer >60 >60 mL/min   Anion gap 6 5 - 15     Ct Abdomen Pelvis W Contrast  Result Date: 10/21/2018 CLINICAL DATA:  Rectal pain. EXAM: CT ABDOMEN AND PELVIS WITH CONTRAST TECHNIQUE: Multidetector CT imaging of the abdomen and pelvis was performed using the standard protocol following bolus  administration of intravenous contrast. CONTRAST:  175mL OMNIPAQUE IOHEXOL 300 MG/ML SOLN, 28mL OMNIPAQUE IOHEXOL 300 MG/ML SOLN COMPARISON:  September 21, 2018 FINDINGS: Lower chest: The patient's known malignancy is only partially imaged in the right lower chest on today's study.  There has been no significant change of the visualized portions of the known malignancy and surrounding mild opacities since September 21, 2018. No other change to the lower chest. Hepatobiliary: Small cysts and low-attenuation lesions in the liver, unchanged since previous studies and considered benign. No suspicious liver masses. The portal vein is patent. The patient is status post cholecystectomy. Intra and extrahepatic biliary duct dilatation persistent is slightly more prominent in the interval. Pancreas: Unremarkable. No pancreatic ductal dilatation or surrounding inflammatory changes. Spleen: Normal in size without focal abnormality. Adrenals/Urinary Tract: The right renal cyst is again identified, unchanged. Kidneys, adrenal glands, ureters, and bladder are otherwise normal. Stomach/Bowel: The stomach is normal. No small bowel obstruction identified. There is a stool ball in the rectum measuring 7.3 by 4.9 by 6.2 cm. There is rectal wall thickening and suspected subtle adjacent fat stranding. There is significant fecal loading in the colon from the cecum through the distal transverse colon. The patient is status post appendectomy. Vascular/Lymphatic: Atherosclerotic changes are seen in the nonaneurysmal aorta. No adenopathy. Reproductive: Status post hysterectomy. No adnexal masses. Other: No abdominal wall hernia or abnormality. No abdominopelvic ascites. Musculoskeletal: No acute bony changes. IMPRESSION: 1. The patient's symptoms are due to a large stool ball in the rectum which is resulting in mild rectal wall thickening and subtle adjacent fat stranding. The findings are concerning for early stercoral colitis . There is  significant fecal loading more proximally in the colon as above. 2. The patient's known malignancy in the right lower chest is incompletely evaluated but grossly unchanged. 3. Atherosclerotic changes in the abdominal aorta. Electronically Signed   By: Dorise Bullion III M.D   On: 10/21/2018 22:12   Dg Abdomen Acute W/chest  Result Date: 10/20/2018 CLINICAL DATA:  Nausea, vomiting, and generalized abdominal pain for 3 weeks. History of stage IV lung cancer, last treatment for weeks ago. EXAM: DG ABDOMEN ACUTE W/ 1V CHEST COMPARISON:  CT chest abdomen and pelvis 09/21/2018 FINDINGS: Power port type central venous catheter with tip over the low SVC region. No pneumothorax. Borderline heart size with normal pulmonary vascularity. Volume loss in the right middle lung consistent with known postobstructive change. Prominence of the right hilum corresponding to known neoplasm. Left lung is clear. No blunting of costophrenic angles. No pneumothorax. Degenerative changes in the spine and shoulders. Scattered gas in the colon and small bowel. No small or large bowel distention. Air-fluid levels are in the colon. This may indicate diarrhea process or liquid stool. Stool and residual contrast material in the rectosigmoid colon. No free intra-abdominal air. Surgical clips in the right upper quadrant and right pelvis. Vascular calcifications. Degenerative changes in the spine and hips. IMPRESSION: 1. Volume loss in the right middle lung consistent with known neoplasm. 2. Air-fluid levels in the colon without distention may indicate liquid stool. No evidence of bowel obstruction. Electronically Signed   By: Lucienne Capers M.D.   On: 10/20/2018 21:55    ROS:  As stated above in the HPI otherwise negative.  Blood pressure 115/69, pulse 64, temperature 98.8 F (37.1 C), temperature source Oral, resp. rate 16, height 5\' 2"  (1.575 m), weight 53.1 kg, SpO2 98 %.    PE: Gen: Crying, uncomfortable HEENT:  /AT,  EOMI Neck: Supple, no LAD Lungs: CTA Bilaterally CV: RRR without M/G/R ABM: Soft, NTND, +BS Ext: No C/C/E Rectal:  Rock hard stool  Assessment/Plan: 1) Severe fecal impaction. 2) Proctalgia. 3) Metastatic lung cancer.   As anticipated from the prior attempts, my  rectal examination was consistent with a rock hard stool ball.  There was a thin layer soft stool at the distal end of the stool ball, but once this was penetrated rock hard stool was encountered.  It was not able to be deformed with digital manipulation.  The stool ball encompassed the entire distal rectum to the point that I was not able to sweep around the stool.  Also, any minor movement resulted in severe proctalgia for the patient.  Further treatment will need to be undertaken by surgery in the OR.  Plan: 1) Please consult surgery for OR disimpaction.  This was discussed with Dr. Tyrell Antonio.  Edem Tiegs D 10/22/2018, 12:41 PM

## 2018-10-23 ENCOUNTER — Encounter (HOSPITAL_COMMUNITY): Payer: Self-pay | Admitting: *Deleted

## 2018-10-23 ENCOUNTER — Inpatient Hospital Stay (HOSPITAL_COMMUNITY): Payer: Medicare Other | Admitting: Anesthesiology

## 2018-10-23 ENCOUNTER — Encounter (HOSPITAL_COMMUNITY)
Admission: EM | Disposition: A | Discharge status: Home / Self Care | Payer: Self-pay | Source: Home / Self Care | Attending: Internal Medicine

## 2018-10-23 LAB — BASIC METABOLIC PANEL
ANION GAP: 8 (ref 5–15)
BUN: 11 mg/dL (ref 8–23)
CALCIUM: 9 mg/dL (ref 8.9–10.3)
CHLORIDE: 112 mmol/L — AB (ref 98–111)
CO2: 21 mmol/L — ABNORMAL LOW (ref 22–32)
CREATININE: 0.54 mg/dL (ref 0.44–1.00)
GFR calc non Af Amer: >60 mL/min (ref >60)
Glucose, Bld: 71 mg/dL (ref 70–99)
Potassium: 3.6 mmol/L (ref 3.5–5.1)
SODIUM: 141 mmol/L (ref 135–145)

## 2018-10-23 LAB — SURGICAL PCR SCREEN
MRSA, PCR: NEGATIVE
Staphylococcus aureus: NEGATIVE

## 2018-10-23 LAB — CBC
HCT: 35.6 % — ABNORMAL LOW (ref 36.0–46.0)
HEMOGLOBIN: 11.8 g/dL — AB (ref 12.0–15.0)
MCH: 29.3 pg (ref 26.0–34.0)
MCHC: 33.1 g/dL (ref 30.0–36.0)
MCV: 88.3 fL (ref 80.0–100.0)
NRBC: 0 % (ref 0.0–0.2)
Platelets: 163 10*3/uL (ref 150–400)
RBC: 4.03 MIL/uL (ref 3.87–5.11)
RDW: 14.7 % (ref 11.5–15.5)
WBC: 7.9 10*3/uL (ref 4.0–10.5)

## 2018-10-23 SURGERY — EXAM UNDER ANESTHESIA
Anesthesia: General | Site: Anus

## 2018-10-23 MED ORDER — FENTANYL CITRATE (PF) 100 MCG/2ML IJ SOLN: 25.0000 ug | INTRAMUSCULAR | Status: DC | PRN

## 2018-10-23 MED ORDER — DEXAMETHASONE SODIUM PHOSPHATE 10 MG/ML IJ SOLN
INTRAMUSCULAR | Status: DC | PRN
  Administered 2018-10-23: 6 mg via INTRAVENOUS

## 2018-10-23 MED ORDER — DOCUSATE SODIUM 100 MG PO CAPS
100.0000 mg | ORAL_CAPSULE | Freq: Every day | ORAL | Status: DC
  Administered 2018-10-23: 100 mg via ORAL
  Filled 2018-10-23: qty 1

## 2018-10-23 MED ORDER — LACTATED RINGERS IV SOLN: INTRAVENOUS | Status: DC

## 2018-10-23 MED ORDER — PROPOFOL 10 MG/ML IV BOLUS
INTRAVENOUS | Status: AC
  Filled 2018-10-23: qty 20

## 2018-10-23 MED ORDER — PROPOFOL 10 MG/ML IV BOLUS
INTRAVENOUS | Status: DC | PRN
  Administered 2018-10-23: 60 mg via INTRAVENOUS

## 2018-10-23 MED ORDER — LACTATED RINGERS IV SOLN
INTRAVENOUS | Status: DC | PRN
  Administered 2018-10-23: 14:00:00 via INTRAVENOUS

## 2018-10-23 MED ORDER — FENTANYL CITRATE (PF) 100 MCG/2ML IJ SOLN
INTRAMUSCULAR | Status: DC | PRN
  Administered 2018-10-23 (×2): 25 ug via INTRAVENOUS

## 2018-10-23 MED ORDER — WITCH HAZEL-GLYCERIN EX PADS
1.0000 "application " | MEDICATED_PAD | CUTANEOUS | Status: DC | PRN
  Filled 2018-10-23: qty 100

## 2018-10-23 MED ORDER — FENTANYL CITRATE (PF) 100 MCG/2ML IJ SOLN
INTRAMUSCULAR | Status: AC
  Filled 2018-10-23: qty 2

## 2018-10-23 MED ORDER — LIDOCAINE 2% (20 MG/ML) 5 ML SYRINGE
INTRAMUSCULAR | Status: DC | PRN
  Administered 2018-10-23: 50 mg via INTRAVENOUS

## 2018-10-23 MED ORDER — 0.9 % SODIUM CHLORIDE (POUR BTL) OPTIME
TOPICAL | Status: DC | PRN
  Administered 2018-10-23: 1000 mL

## 2018-10-23 MED ORDER — DIPHENHYDRAMINE HCL 25 MG PO CAPS
25.0000 mg | ORAL_CAPSULE | Freq: Four times a day (QID) | ORAL | Status: DC | PRN
  Administered 2018-10-23 – 2018-10-24 (×2): 25 mg via ORAL
  Filled 2018-10-23 (×2): qty 1

## 2018-10-23 MED ORDER — MUPIROCIN 2 % EX OINT: 1.0000 "application " | TOPICAL_OINTMENT | Freq: Two times a day (BID) | CUTANEOUS | Status: DC

## 2018-10-23 SURGICAL SUPPLY — 41 items
BLADE HEX COATED 2.75 (ELECTRODE) IMPLANT
BLADE SURG 15 STRL LF DISP TIS (BLADE) IMPLANT
BLADE SURG 15 STRL SS (BLADE)
CANNULA VESSEL W/WING WO/VALVE (CANNULA) ×3 IMPLANT
COVER SURGICAL LIGHT HANDLE (MISCELLANEOUS) ×3 IMPLANT
COVER WAND RF STERILE (DRAPES) IMPLANT
DECANTER SPIKE VIAL GLASS SM (MISCELLANEOUS) ×3 IMPLANT
DRAPE SHEET LG 3/4 BI-LAMINATE (DRAPES) ×3 IMPLANT
DRSG PAD ABDOMINAL 8X10 ST (GAUZE/BANDAGES/DRESSINGS) IMPLANT
ELECT PENCIL ROCKER SW 15FT (MISCELLANEOUS) ×2 IMPLANT
ELECT REM PT RETURN 15FT ADLT (MISCELLANEOUS) ×3 IMPLANT
GAUZE 4X4 16PLY RFD (DISPOSABLE) ×3 IMPLANT
GAUZE SPONGE 4X4 12PLY STRL (GAUZE/BANDAGES/DRESSINGS) ×3 IMPLANT
GLOVE BIOGEL PI IND STRL 7.0 (GLOVE) ×1 IMPLANT
GLOVE BIOGEL PI INDICATOR 7.0 (GLOVE) ×2
GLOVE SURG SIGNA 7.5 PF LTX (GLOVE) ×5 IMPLANT
GOWN SPEC L4 XLG W/TWL (GOWN DISPOSABLE) ×3 IMPLANT
GOWN STRL REUS W/ TWL XL LVL3 (GOWN DISPOSABLE) ×3 IMPLANT
GOWN STRL REUS W/TWL LRG LVL3 (GOWN DISPOSABLE) ×3 IMPLANT
GOWN STRL REUS W/TWL XL LVL3 (GOWN DISPOSABLE) ×9
KIT BASIN OR (CUSTOM PROCEDURE TRAY) ×3 IMPLANT
NDL HYPO 25X1 1.5 SAFETY (NEEDLE) IMPLANT
NDL SAFETY ECLIPSE 18X1.5 (NEEDLE) IMPLANT
NEEDLE HYPO 18GX1.5 SHARP (NEEDLE)
NEEDLE HYPO 25X1 1.5 SAFETY (NEEDLE) IMPLANT
NS IRRIG 1000ML POUR BTL (IV SOLUTION) ×3 IMPLANT
PACK LITHOTOMY IV (CUSTOM PROCEDURE TRAY) ×3 IMPLANT
SPONGE SURGIFOAM ABS GEL 100 (HEMOSTASIS) ×3 IMPLANT
SURGILUBE 2OZ TUBE FLIPTOP (MISCELLANEOUS) ×3 IMPLANT
SUT CHROMIC 2 0 SH (SUTURE) IMPLANT
SUT CHROMIC 3 0 SH 27 (SUTURE) IMPLANT
SYR 50ML LL SCALE MARK (SYRINGE) ×3 IMPLANT
SYR BULB IRRIGATION 50ML (SYRINGE) ×2 IMPLANT
SYR CONTROL 10ML LL (SYRINGE) ×3 IMPLANT
TOWEL OR 17X26 10 PK STRL BLUE (TOWEL DISPOSABLE) ×3 IMPLANT
TRAP SPECIMEN MUCOUS 40CC (MISCELLANEOUS) IMPLANT
TUBING CONNECTING 10 (TUBING) ×2 IMPLANT
TUBING CONNECTING 10' (TUBING) ×1
UNDERPAD 30X30 (UNDERPADS AND DIAPERS) ×3 IMPLANT
WATER STERILE IRR 1000ML POUR (IV SOLUTION) ×3 IMPLANT
YANKAUER SUCT BULB TIP 10FT TU (MISCELLANEOUS) ×3 IMPLANT

## 2018-10-23 NOTE — Progress Notes (Signed)
PROGRESS NOTE    Faith [name]  VEH:[id number] DOB: January 27, 1951 DOA: 10/20/2018 PCP: [name], MD    Brief Narrative: 67 year old with past medical history significant for stroke, chronic pain, stage IV carcinoma of the lung and right neck who presents complaining of rectal pain, difficulty passing stool. She also reports a history of some loose stool. ED physician performed a rectal exam and patient was noted to be impacted. Manual  desimpaction was not successful. KUB without obstruction. Patient has not had a significant bowel movement, CT abdomen showed  a stool ball in the rectum measuring 7.3 by 4.9 by 6.2 cm. GI was consulted, Dr. [name] evaluated the patient and performed a rectal exam. He is recommending surgery consultation. I have consulted surgery.   Assessment & Plan:   Principal Problem:   Fecal impaction in rectum [facility]) Active Problems:   History of stroke   Hypokalemia   Non-small cell carcinoma of right lung, stage 4 (HCC)   Fecal impaction of rectum (HCC)   Chronic pain   1-Rectal pain, abdominal pain; fecal impaction;  And has received multiple enema, SMOG, [place]. No Bowel movement.   Now on IV dilaudid for pain, morphine was not controlling pain.  CT abdomen showed stool ball.  Evaluated by Dr [name] who recommend surgical consultation. . Surgery consulted. Plan to OR today for disimpaction.   2-Hypokalemia; Resolved.   3-Stage IV carcinoma with large necrotic right lower neck mass and lung with mediastinal and hilar nodes involvement She is status post palliative radiation and systemic chemotherapy. She is now on Keytruda.   Dehydration, metabolic acidosis.  IV fluids.  Poor oral intake.   Oral thrush; start nystatin.   Chronic pain; continue with Voltaren gel, Oxycodone.   History of CVA; Continue with plavix.    RN Pressure Injury Documentation:    Malnutrition Type:      Malnutrition Characteristics:      Nutrition  Interventions:     Estimated body mass index is 21.4 kg/m as calculated from the following:   Height as of this encounter: 5\' 2"  (1.575 m).   Weight as of this encounter: 53.1 kg.   DVT prophylaxis: Lovenox Code Status: full code Family Communication: no family at bedside Disposition Plan: remaining inpatient for management of fecal impaction, IV fluids for poor oral intake  Consultants:   GI  Surgery    Procedures:   none  Antimicrobials: (none  Subjective: Alert, still having rectal pain. Appears more calm today, report pain med is helping.    Objective: Vitals:   10/22/18 0524 10/22/18 1410 10/22/18 2124 10/23/18 0509  BP: 115/69 128/66 118/73 131/62  Pulse: 64 63 77 66  Resp: 16 15 18 20   Temp: 98.8 F (37.1 C) 99.2 F (37.3 C) 98.2 F (36.8 C) 98.5 F (36.9 C)  TempSrc: Oral Oral    SpO2: 98% 99% 97% 100%  Weight:      Height:       No intake or output data in the 24 hours ending 10/23/18 1348 Filed Weights   10/20/18 1802  Weight: 53.1 kg    Examination:  General exam: Alert, more calm.  Respiratory system: CTA Cardiovascular system: S 1, S 2 RRR Gastrointestinal system: BS present, soft, mild tenderness.  Central nervous system: non focal.  Extremities: Symmetric power.  Skin: No rashes.    Data Reviewed: I have personally reviewed following labs and imaging studies  CBC: Recent Labs  Lab 10/20/18 1807 10/21/18 0500 10/22/18 0811 10/23/18  0922  WBC 9.5 10.4 9.6 7.9  NEUTROABS  --  7.4  --   --   HGB 14.4 12.9 12.3 11.8*  HCT 43.0 38.9 36.5 35.6*  MCV 86.7 87.8 88.0 88.3  PLT 230 194 179 263   Basic Metabolic Panel: Recent Labs  Lab 10/20/18 1807 10/21/18 0500 10/22/18 0811 10/23/18 0922  NA 138 138 139 141  K 2.9* 3.6 3.8 3.6  CL 104 107 109 112*  CO2 22 21* 24 21*  GLUCOSE 121* 118* 98 71  BUN 19 12 6* 11  CREATININE 0.67 0.51 0.47 0.54  CALCIUM 10.9* 9.2 8.8* 9.0   GFR: Estimated Creatinine Clearance: 54.7  mL/min (by C-G formula based on SCr of 0.54 mg/dL). Liver Function Tests: Recent Labs  Lab 10/20/18 1807  AST 21  ALT 12  ALKPHOS 92  BILITOT 1.1  PROT 8.8*  ALBUMIN 4.9   Recent Labs  Lab 10/20/18 1807  LIPASE 21   No results for input(s): AMMONIA in the last 168 hours. Coagulation Profile: No results for input(s): INR, PROTIME in the last 168 hours. Cardiac Enzymes: No results for input(s): CKTOTAL, CKMB, CKMBINDEX, TROPONINI in the last 168 hours. BNP (last 3 results) No results for input(s): PROBNP in the last 8760 hours. HbA1C: No results for input(s): HGBA1C in the last 72 hours. CBG: No results for input(s): GLUCAP in the last 168 hours. Lipid Profile: No results for input(s): CHOL, HDL, LDLCALC, TRIG, CHOLHDL, LDLDIRECT in the last 72 hours. Thyroid Function Tests: No results for input(s): TSH, T4TOTAL, FREET4, T3FREE, THYROIDAB in the last 72 hours. Anemia Panel: No results for input(s): VITAMINB12, FOLATE, FERRITIN, TIBC, IRON, RETICCTPCT in the last 72 hours. Sepsis Labs: No results for input(s): PROCALCITON, LATICACIDVEN in the last 168 hours.  Recent Results (from the past 240 hour(s))  Gastrointestinal Panel by PCR , Stool     Status: None   Collection Time: 10/20/18  7:32 PM  Result Value Ref Range Status   Campylobacter species NOT DETECTED NOT DETECTED Final   Plesimonas shigelloides NOT DETECTED NOT DETECTED Final   Salmonella species NOT DETECTED NOT DETECTED Final   Yersinia enterocolitica NOT DETECTED NOT DETECTED Final   Vibrio species NOT DETECTED NOT DETECTED Final   Vibrio cholerae NOT DETECTED NOT DETECTED Final   Enteroaggregative E coli (EAEC) NOT DETECTED NOT DETECTED Final   Enteropathogenic E coli (EPEC) NOT DETECTED NOT DETECTED Final   Enterotoxigenic E coli (ETEC) NOT DETECTED NOT DETECTED Final   Shiga like toxin producing E coli (STEC) NOT DETECTED NOT DETECTED Final   Shigella/Enteroinvasive E coli (EIEC) NOT DETECTED NOT  DETECTED Final   Cryptosporidium NOT DETECTED NOT DETECTED Final   Cyclospora cayetanensis NOT DETECTED NOT DETECTED Final   Entamoeba histolytica NOT DETECTED NOT DETECTED Final   Giardia lamblia NOT DETECTED NOT DETECTED Final   Adenovirus F40/41 NOT DETECTED NOT DETECTED Final   Astrovirus NOT DETECTED NOT DETECTED Final   Norovirus GI/GII NOT DETECTED NOT DETECTED Final   Rotavirus A NOT DETECTED NOT DETECTED Final   Sapovirus (I, II, IV, and V) NOT DETECTED NOT DETECTED Final    Comment: Performed at [facility], [address], [place], [place] [address]         Radiology Studies: Ct Abdomen Pelvis W Contrast  Result Date: 10/21/2018 CLINICAL DATA:  Rectal pain. EXAM: CT ABDOMEN AND PELVIS WITH CONTRAST TECHNIQUE: Multidetector CT imaging of the abdomen and pelvis was performed using the standard protocol following bolus administration  of intravenous contrast. CONTRAST:  153mL OMNIPAQUE IOHEXOL 300 MG/ML SOLN, 40mL OMNIPAQUE IOHEXOL 300 MG/ML SOLN COMPARISON:  September 21, 2018 FINDINGS: Lower chest: The patient's known malignancy is only partially imaged in the right lower chest on today's study. There has been no significant change of the visualized portions of the known malignancy and surrounding mild opacities since September 21, 2018. No other change to the lower chest. Hepatobiliary: Small cysts and low-attenuation lesions in the liver, unchanged since previous studies and considered benign. No suspicious liver masses. The portal vein is patent. The patient is status post cholecystectomy. Intra and extrahepatic biliary duct dilatation persistent is slightly more prominent in the interval. Pancreas: Unremarkable. No pancreatic ductal dilatation or surrounding inflammatory changes. Spleen: Normal in size without focal abnormality. Adrenals/Urinary Tract: The right renal cyst is again identified, unchanged. Kidneys, adrenal glands, ureters, and bladder are otherwise normal.  Stomach/Bowel: The stomach is normal. No small bowel obstruction identified. There is a stool ball in the rectum measuring 7.3 by 4.9 by 6.2 cm. There is rectal wall thickening and suspected subtle adjacent fat stranding. There is significant fecal loading in the colon from the cecum through the distal transverse colon. The patient is status post appendectomy. Vascular/Lymphatic: Atherosclerotic changes are seen in the nonaneurysmal aorta. No adenopathy. Reproductive: Status post hysterectomy. No adnexal masses. Other: No abdominal wall hernia or abnormality. No abdominopelvic ascites. Musculoskeletal: No acute bony changes. IMPRESSION: 1. The patient's symptoms are due to a large stool ball in the rectum which is resulting in mild rectal wall thickening and subtle adjacent fat stranding. The findings are concerning for early stercoral colitis . There is significant fecal loading more proximally in the colon as above. 2. The patient's known malignancy in the right lower chest is incompletely evaluated but grossly unchanged. 3. Atherosclerotic changes in the abdominal aorta. Electronically Signed   By: Dorise Bullion III M.D   On: 10/21/2018 22:12        Scheduled Meds: . clopidogrel  75 mg Oral Daily  . enoxaparin (LOVENOX) injection  40 mg Subcutaneous Q24H  . gabapentin  300 mg Oral TID  . nystatin  5 mL Oral QID  . pantoprazole  40 mg Oral Daily  . potassium chloride  40 mEq Oral Daily  . sodium chloride flush  3 mL Intravenous Q12H   Continuous Infusions: . sodium chloride    . sodium chloride 100 mL/hr at 10/23/18 0927     LOS: 2 days    Time spent: 35 minutes    Elmarie Shiley, MD [facility] Pager [phone]  If 7PM-7AM, please contact night-coverage [email or web address] Password [email or web address] 10/23/2018, 1:48 PM

## 2018-10-23 NOTE — Anesthesia Procedure Notes (Signed)
Procedure Name: LMA Insertion Date/Time: 10/23/2018 2:39 PM Performed by: Anne Fu, CRNA Pre-anesthesia Checklist: Patient identified, Emergency Drugs available, Suction available, Patient being monitored and Timeout performed Patient Re-evaluated:Patient Re-evaluated prior to induction Oxygen Delivery Method: Circle system utilized Preoxygenation: Pre-oxygenation with 100% oxygen Induction Type: IV induction Ventilation: Mask ventilation without difficulty LMA: LMA inserted LMA Size: 4.0 Number of attempts: 1 Placement Confirmation: positive ETCO2 and breath sounds checked- equal and bilateral Tube secured with: Tape

## 2018-10-23 NOTE — Transfer of Care (Signed)
Immediate Anesthesia Transfer of Care Note  Patient: Faith [name]  Procedure(s) Performed: Procedure(s): EXAM UNDER ANESTHESIA, disimpaction (N/A)  Patient Location: PACU  Anesthesia Type:General  Level of Consciousness:  sedated, patient cooperative and responds to stimulation  Airway & Oxygen Therapy:Patient Spontanous Breathing and Patient connected to face mask oxgen  Post-op Assessment:  Report given to PACU RN and Post -op Vital signs reviewed and stable  Post vital signs:  Reviewed and stable  Last Vitals:  Vitals:   10/22/18 2124 10/23/18 0509  BP: 118/73 131/62  Pulse: 77 66  Resp: 18 20  Temp: 36.8 C 36.9 C  SpO2: 01% 027%    Complications: No apparent anesthesia complications

## 2018-10-23 NOTE — Anesthesia Postprocedure Evaluation (Signed)
Anesthesia Post Note  Patient: Faith [name]  Procedure(s) Performed: EXAM UNDER ANESTHESIA, disimpaction (N/A Anus)     Patient location during evaluation: PACU Anesthesia Type: General Level of consciousness: awake and alert Pain management: pain level controlled Vital Signs Assessment: post-procedure vital signs reviewed and stable Respiratory status: spontaneous breathing, nonlabored ventilation and respiratory function stable Cardiovascular status: blood pressure returned to baseline and stable Postop Assessment: no apparent nausea or vomiting Anesthetic complications: no    Last Vitals:  Vitals:   10/23/18 1545 10/23/18 1604  BP: (!) 132/56 (!) 122/58  Pulse: (!) 42 (!) 45  Resp: 12 16  Temp: 36.5 C 36.8 C  SpO2: 96% 93%    Last Pain:  Vitals:   10/23/18 1604  TempSrc: Oral  PainSc:                  Carol Theys,W. EDMOND

## 2018-10-23 NOTE — Progress Notes (Signed)
Pt continues to have leakage of liquid stool very frequently, unable to sit on the [facility] as its too uncomfortable. States she cant even sit to urinate. Pt refused a rectal exam or an enema.

## 2018-10-23 NOTE — Anesthesia Preprocedure Evaluation (Addendum)
Anesthesia Evaluation  Patient identified by MRN, date of birth, ID band Patient awake    Reviewed: Allergy & Precautions, H&P , NPO status , Patient's Chart, lab work & pertinent test results  Airway Mallampati: II  TM Distance: >3 FB Neck ROM: Full    Dental no notable dental hx. (+) Edentulous Upper, Dental Advisory Given   Pulmonary neg pulmonary ROS, former smoker,    Pulmonary exam normal breath sounds clear to auscultation       Cardiovascular hypertension,  Rhythm:Regular Rate:Normal     Neuro/Psych Anxiety Depression Bipolar Disorder Schizophrenia CVA    GI/Hepatic Neg liver ROS, GERD  Medicated and Controlled,  Endo/Other  negative endocrine ROS  Renal/GU negative Renal ROS  negative genitourinary   Musculoskeletal  (+) Arthritis , Osteoarthritis,    Abdominal   Peds  Hematology negative hematology ROS (+)   Anesthesia Other Findings   Reproductive/Obstetrics negative OB ROS                            Anesthesia Physical Anesthesia Plan  ASA: III  Anesthesia Plan: General   Post-op Pain Management:    Induction: Intravenous  PONV Risk Score and Plan: 3 and Ondansetron, Dexamethasone and Treatment may vary due to age or medical condition  Airway Management Planned: Oral ETT  Additional Equipment:   Intra-op Plan:   Post-operative Plan: Extubation in OR  Informed Consent: I have reviewed the patients History and Physical, chart, labs and discussed the procedure including the risks, benefits and alternatives for the proposed anesthesia with the patient or authorized representative who has indicated his/her understanding and acceptance.   Dental advisory given  Plan Discussed with: CRNA  Anesthesia Plan Comments:        Anesthesia Quick Evaluation

## 2018-10-23 NOTE — Op Note (Signed)
10/23/2018  3:05 PM  PATIENT:  Faith [name], 67 y.o., female, MRN: [id number]  PREOP DIAGNOSIS:  rectal impaction  POSTOP DIAGNOSIS:   Rectal stool impaction  PROCEDURE:   Procedure(s):EXAM UNDER ANESTHESIA, disimpaction  SURGEON:   Alphonsa Overall, M.D.  ASSISTANT:   None  ANESTHESIA:   spinal and general  Anesthesiologist: Roderic Palau, MD CRNA: Anne Fu, CRNA  General  EBL:  minimal  ml  BLOOD ADMINISTERED: none  DRAINS: none   LOCAL MEDICATIONS USED:   none  SPECIMEN:   none  COUNTS CORRECT:  YES  INDICATIONS FOR PROCEDURE:  [name] is a 67 y.o. (DOB: 10/20/1951) AA female whose primary care physician is [name], MD and comes for EUA and disimpaction.   She has advanced carcinoma and is on chronic pain meds.  The indications and risks of the surgery were explained to the patient.  The risks include, but are not limited to, infection, bleeding, and nerve injury.  PROCEDURE: The patient was taken to room 1 at [facility].  She was placed in lithotomy position under general anesthesia.  A timeout was held the surgical checklist run.  I did an exam under anesthesia.  The patient had a large hard stool ball approximately 6 x 8 cm impacted in her distal rectum.  I disimpacted this stool ball.  I was able to clean the rectum out entirely.  I can feel up to about 10 cm from the anal verge and felt no further hard stool.  I then did an anoscopy that showed no obvious rectal mass or tumor.  She did have some tears along her anus to get the stool out.  But these were fairly minimal.  She was transferred to recovery room in good condition.  Alphonsa Overall, MD, [facility] Surgery Pager: [phone] Office phone:  [phone]

## 2018-10-23 NOTE — Progress Notes (Signed)
Dr. Oren Bracket at bedside to advise of frequent PAC's and occasional runs of bigeminy/PVC's. B/P remains stable and patient is asymptomatic. No oders received.

## 2018-10-23 NOTE — Progress Notes (Signed)
[facility] Office:  [phone] General Surgery Progress Note   LOS: 2 days  POD -     Chief Complaint: Rectal pain  Assessment and Plan: 1.  Impacted  Rectal stool ball - 6.1 cm on CT scan of 10/21/2018  Seen by Dr. [name]  Plan to the OR today for disimpaction.  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 Lind Guest (ENT) did original biopsy in Jan 2019  Txed by Dr. Julien Nordmann 3.  CVA   4.  On Plavix 5.  Chronic pain  On oxycodone 20 mg QID 6.  Dupuytren's contracture of right hand 7.  Right fem pop, endarterectomy - 01/15/2107 - Dr. Donzetta Matters  6.  DVT prophylaxis - lovenox 7.  Power port -  left chest   Principal Problem:   Fecal impaction in rectum [facility]) Active Problems:   History of stroke   Hypokalemia   Non-small cell carcinoma of right lung, stage 4 (HCC)   Fecal impaction of rectum (HCC)   Chronic pain  Subjective:  Not a great historian.  Having rectal pain.  It is hard to tell her normal bowel habits, but this is her first impaction.  I spoke to her daughterMinette Headland, [phone], by phone  Objective:   Vitals:   10/22/18 2124 10/23/18 0509  BP: 118/73 131/62  Pulse: 77 66  Resp: 18 20  Temp: 98.2 F (36.8 C) 98.5 F (36.9 C)  SpO2: 97% 100%     Intake/Output from previous day:  11/22 0701 - 11/23 0700 In: 60 [P.O.:60] Out: -   Intake/Output this shift:  No intake/output data recorded.   Physical Exam:   General: Thin AA F who is alert and oriented.  Her speech pattern is hard to understand   HEENT: Normal. Pupils equal.   Abdomen: Soft.  I did not do rectal exam because of prior exams.  Will address this in the OR.   Lab Results:    Recent Labs    10/22/18 0811 10/23/18 0922  WBC 9.6 7.9  HGB 12.3 11.8*  HCT 36.5 35.6*  PLT 179 163    BMET   Recent Labs    10/22/18 0811 10/23/18 0922  NA 139 141  K  3.8 3.6  CL 109 112*  CO2 24 21*  GLUCOSE 98 71  BUN 6* 11  CREATININE 0.47 0.54  CALCIUM 8.8* 9.0    PT/INR  No results for input(s): LABPROT, INR in the last 72 hours.  ABG  No results for input(s): PHART, HCO3 in the last 72 hours.  Invalid input(s): PCO2, PO2   Studies/Results:  Ct Abdomen Pelvis W Contrast  Result Date: 10/21/2018 CLINICAL DATA:  Rectal pain. EXAM: CT ABDOMEN AND PELVIS WITH CONTRAST TECHNIQUE: Multidetector CT imaging of the abdomen and pelvis was performed using the standard protocol following bolus administration of intravenous contrast. CONTRAST:  143mL OMNIPAQUE IOHEXOL 300 MG/ML SOLN, 23mL OMNIPAQUE IOHEXOL 300 MG/ML SOLN COMPARISON:  September 21, 2018 FINDINGS: Lower chest: The patient's known malignancy is only partially imaged in the right lower chest on today's study. There has been no significant change of the visualized portions of the known malignancy and surrounding mild opacities since September 21, 2018. No other change to the lower chest. Hepatobiliary: Small cysts and low-attenuation lesions in the liver, unchanged since previous studies and considered benign. No suspicious liver masses. The  portal vein is patent. The patient is status post cholecystectomy. Intra and extrahepatic biliary duct dilatation persistent is slightly more prominent in the interval. Pancreas: Unremarkable. No pancreatic ductal dilatation or surrounding inflammatory changes. Spleen: Normal in size without focal abnormality. Adrenals/Urinary Tract: The right renal cyst is again identified, unchanged. Kidneys, adrenal glands, ureters, and bladder are otherwise normal. Stomach/Bowel: The stomach is normal. No small bowel obstruction identified. There is a stool ball in the rectum measuring 7.3 by 4.9 by 6.2 cm. There is rectal wall thickening and suspected subtle adjacent fat stranding. There is significant fecal loading in the colon from the cecum through the distal transverse colon. The  patient is status post appendectomy. Vascular/Lymphatic: Atherosclerotic changes are seen in the nonaneurysmal aorta. No adenopathy. Reproductive: Status post hysterectomy. No adnexal masses. Other: No abdominal wall hernia or abnormality. No abdominopelvic ascites. Musculoskeletal: No acute bony changes. IMPRESSION: 1. The patient's symptoms are due to a large stool ball in the rectum which is resulting in mild rectal wall thickening and subtle adjacent fat stranding. The findings are concerning for early stercoral colitis . There is significant fecal loading more proximally in the colon as above. 2. The patient's known malignancy in the right lower chest is incompletely evaluated but grossly unchanged. 3. Atherosclerotic changes in the abdominal aorta. Electronically Signed   By: Dorise Bullion III M.D   On: 10/21/2018 22:12     Anti-infectives:   Anti-infectives (From admission, onward)   None      Alphonsa Overall, MD, FACS Pager: [facility] Office: [phone] 10/23/2018

## 2018-10-24 ENCOUNTER — Inpatient Hospital Stay (HOSPITAL_COMMUNITY): Payer: Medicare Other

## 2018-10-24 LAB — BASIC METABOLIC PANEL
Anion gap: 5 (ref 5–15)
BUN: 12 mg/dL (ref 8–23)
CHLORIDE: 114 mmol/L — AB (ref 98–111)
CO2: 23 mmol/L (ref 22–32)
CREATININE: 0.57 mg/dL (ref 0.44–1.00)
Calcium: 8.8 mg/dL — ABNORMAL LOW (ref 8.9–10.3)
GFR calc Af Amer: >60 mL/min (ref >60)
GFR calc non Af Amer: >60 mL/min (ref >60)
GLUCOSE: 132 mg/dL — AB (ref 70–99)
POTASSIUM: 4.1 mmol/L (ref 3.5–5.1)
SODIUM: 142 mmol/L (ref 135–145)

## 2018-10-24 LAB — MAGNESIUM: Magnesium: 1.6 mg/dL — ABNORMAL LOW (ref 1.7–2.4)

## 2018-10-24 LAB — TROPONIN I

## 2018-10-24 MED ORDER — MAGNESIUM SULFATE 2 GM/50ML IV SOLN
2.0000 g | Freq: Once | INTRAVENOUS | Status: AC
  Administered 2018-10-24: 2 g via INTRAVENOUS
  Filled 2018-10-24: qty 50

## 2018-10-24 MED ORDER — ALUM & MAG HYDROXIDE-SIMETH 200-200-20 MG/5ML PO SUSP
30.0000 mL | Freq: Four times a day (QID) | ORAL | Status: DC | PRN
  Administered 2018-10-24: 30 mL via ORAL
  Filled 2018-10-24: qty 30

## 2018-10-24 MED ORDER — SENNOSIDES-DOCUSATE SODIUM 8.6-50 MG PO TABS
1.0000 | ORAL_TABLET | Freq: Two times a day (BID) | ORAL | Status: DC
  Administered 2018-10-24 – 2018-10-25 (×3): 1 via ORAL
  Filled 2018-10-24 (×3): qty 1

## 2018-10-24 MED ORDER — GUAIFENESIN-DM 100-10 MG/5ML PO SYRP
5.0000 mL | ORAL_SOLUTION | ORAL | Status: DC | PRN
  Administered 2018-10-24 (×2): 5 mL via ORAL
  Filled 2018-10-24 (×2): qty 10

## 2018-10-24 MED ORDER — POLYETHYLENE GLYCOL 3350 17 G PO PACK
17.0000 g | PACK | Freq: Two times a day (BID) | ORAL | Status: DC
  Administered 2018-10-24 – 2018-10-25 (×3): 17 g via ORAL
  Filled 2018-10-24 (×3): qty 1

## 2018-10-24 NOTE — Progress Notes (Signed)
PROGRESS NOTE    Faith [name]  OYD:[id number] DOB: Dec 09, 1950 DOA: 10/20/2018 PCP: [name], MD    Brief Narrative: 67 year old with past medical history significant for stroke, chronic pain, stage IV carcinoma of the lung and right neck who presents complaining of rectal pain, difficulty passing stool. She also reports a history of some loose stool. ED physician performed a rectal exam and patient was noted to be impacted. Manual  desimpaction was not successful. KUB without obstruction. Patient has not had a significant bowel movement, CT abdomen showed  a stool ball in the rectum measuring 7.3 by 4.9 by 6.2 cm. GI was consulted, Dr. [name] evaluated the patient and performed a rectal exam. He is recommending surgery consultation. I have consulted surgery.  Patient was taken to the OR, for rectal stool disimpaction by Dr. [name] on 10/23/2018.  Post op, she is feeling better, abdominal pain has improved significantly.    Assessment & Plan:   Principal Problem:   Fecal impaction in rectum [facility]) Active Problems:   History of stroke   Hypokalemia   Non-small cell carcinoma of right lung, stage 4 (HCC)   Fecal impaction of rectum (HCC)   Chronic pain   1-Rectal pain, abdominal pain; fecal impaction;  And has received multiple enema, SMOG, Golytlty. No Bowel movement.   Now on IV dilaudid for pain, morphine was not controlling pain.  CT abdomen showed stool ball.  Evaluated by Dr [name] who recommend surgical consultation. . Surgery consulted.  Patient underwent stool  Disimpaction 11-23.  Pain has improved.  She will need to be on scheduled bowel regimen.  Was advised to follow-up with her pain clinic to have titration down of opioid.   2-Chest pain;  Patient developed chest discomfort.  Will check EKG, troponin, chest x-ray. Observe overnight.  Hypokalemia; Resolved.   3-Stage IV carcinoma with large necrotic right lower neck mass and lung with mediastinal and hilar nodes  involvement She is status post palliative radiation and systemic chemotherapy. She is now on Keytruda.   Dehydration, metabolic acidosis.  IV fluids.  Poor oral intake.  Improved.  Hypomagnesemia: IV magnesium. PVCs, bigeminy; noticed post op.  Will replete magnesium IV. Check electrolytes Oral thrush; start nystatin.   Chronic pain; continue with Voltaren gel, Oxycodone.   History of CVA; Continue with plavix.    RN Pressure Injury Documentation:    Malnutrition Type:      Malnutrition Characteristics:      Nutrition Interventions:     Estimated body mass index is 21.4 kg/m as calculated from the following:   Height as of this encounter: 5\' 2"  (1.575 m).   Weight as of this encounter: 53.1 kg.   DVT prophylaxis: Lovenox Code Status: full code Family Communication: no family at bedside Disposition Plan: remaining inpatient for management of fecal impaction, IV fluids for poor oral intake  Consultants:   GI  Surgery    Procedures:   none  Antimicrobials: (none  Subjective: He is feeling much better today, reports abdominal pain has improved.  Still feel the sensation that she needs to have a bowel movement. Complaining of chest pain, pressure needed of the chest.  Objective: Vitals:   10/23/18 1604 10/23/18 2130 10/23/18 2131 10/24/18 0432  BP: (!) 122/58 (!) 142/80  120/62  Pulse: (!) 45 87 84 60  Resp: 16 20  18   Temp: 98.2 F (36.8 C) 98.2 F (36.8 C)  98.4 F (36.9 C)  TempSrc: Oral Oral  Oral  SpO2:  93%  96% 100%  Weight:      Height:        Intake/Output Summary (Last 24 hours) at 10/24/2018 1251 Last data filed at 10/24/2018 1000 Gross per 24 hour  Intake 1612.89 ml  Output 10 ml  Net 1602.89 ml   Filed Weights   10/20/18 1802  Weight: 53.1 kg    Examination:  General exam: Alert, in no acute distress. Respiratory system: Normal respiratory effort, crackles at the bases. Cardiovascular system: S1, S2 regular, rate.    Gastrointestinal system: Bowel sounds present, soft, nontender nondistended.  Central nervous system: Nonfocal Extremities: Negative bilaterally, no edema. Skin: No rashes.   Data Reviewed: I have personally reviewed following labs and imaging studies  CBC: Recent Labs  Lab 10/20/18 1807 10/21/18 0500 10/22/18 0811 10/23/18 0922  WBC 9.5 10.4 9.6 7.9  NEUTROABS  --  7.4  --   --   HGB 14.4 12.9 12.3 11.8*  HCT 43.0 38.9 36.5 35.6*  MCV 86.7 87.8 88.0 88.3  PLT 230 194 179 361   Basic Metabolic Panel: Recent Labs  Lab 10/20/18 1807 10/21/18 0500 10/22/18 0811 10/23/18 0922 10/24/18 0850  NA 138 138 139 141  --   K 2.9* 3.6 3.8 3.6  --   CL 104 107 109 112*  --   CO2 22 21* 24 21*  --   GLUCOSE 121* 118* 98 71  --   BUN 19 12 6* 11  --   CREATININE 0.67 0.51 0.47 0.54  --   CALCIUM 10.9* 9.2 8.8* 9.0  --   MG  --   --   --   --  1.6*   GFR: Estimated Creatinine Clearance: 54.7 mL/min (by C-G formula based on SCr of 0.54 mg/dL). Liver Function Tests: Recent Labs  Lab 10/20/18 1807  AST 21  ALT 12  ALKPHOS 92  BILITOT 1.1  PROT 8.8*  ALBUMIN 4.9   Recent Labs  Lab 10/20/18 1807  LIPASE 21   No results for input(s): AMMONIA in the last 168 hours. Coagulation Profile: No results for input(s): INR, PROTIME in the last 168 hours. Cardiac Enzymes: Recent Labs  Lab 10/24/18 1121  TROPONINI <0.03   BNP (last 3 results) No results for input(s): PROBNP in the last 8760 hours. HbA1C: No results for input(s): HGBA1C in the last 72 hours. CBG: No results for input(s): GLUCAP in the last 168 hours. Lipid Profile: No results for input(s): CHOL, HDL, LDLCALC, TRIG, CHOLHDL, LDLDIRECT in the last 72 hours. Thyroid Function Tests: No results for input(s): TSH, T4TOTAL, FREET4, T3FREE, THYROIDAB in the last 72 hours. Anemia Panel: No results for input(s): VITAMINB12, FOLATE, FERRITIN, TIBC, IRON, RETICCTPCT in the last 72 hours. Sepsis Labs: No results for  input(s): PROCALCITON, LATICACIDVEN in the last 168 hours.  Recent Results (from the past 240 hour(s))  Gastrointestinal Panel by PCR , Stool     Status: None   Collection Time: 10/20/18  7:32 PM  Result Value Ref Range Status   Campylobacter species NOT DETECTED NOT DETECTED Final   Plesimonas shigelloides NOT DETECTED NOT DETECTED Final   Salmonella species NOT DETECTED NOT DETECTED Final   Yersinia enterocolitica NOT DETECTED NOT DETECTED Final   Vibrio species NOT DETECTED NOT DETECTED Final   Vibrio cholerae NOT DETECTED NOT DETECTED Final   Enteroaggregative E coli (EAEC) NOT DETECTED NOT DETECTED Final   Enteropathogenic E coli (EPEC) NOT DETECTED NOT DETECTED Final   Enterotoxigenic E coli (ETEC) NOT DETECTED  NOT DETECTED Final   Shiga like toxin producing E coli (STEC) NOT DETECTED NOT DETECTED Final   Shigella/Enteroinvasive E coli (EIEC) NOT DETECTED NOT DETECTED Final   Cryptosporidium NOT DETECTED NOT DETECTED Final   Cyclospora cayetanensis NOT DETECTED NOT DETECTED Final   Entamoeba histolytica NOT DETECTED NOT DETECTED Final   Giardia lamblia NOT DETECTED NOT DETECTED Final   Adenovirus F40/41 NOT DETECTED NOT DETECTED Final   Astrovirus NOT DETECTED NOT DETECTED Final   Norovirus GI/GII NOT DETECTED NOT DETECTED Final   Rotavirus A NOT DETECTED NOT DETECTED Final   Sapovirus (I, II, IV, and V) NOT DETECTED NOT DETECTED Final    Comment: Performed at [facility], [address] [place], [place] [address]  Surgical PCR screen     Status: None   Collection Time: 10/23/18  1:49 PM  Result Value Ref Range Status   MRSA, PCR NEGATIVE NEGATIVE Final   Staphylococcus aureus NEGATIVE NEGATIVE Final    Comment: (NOTE) The Xpert SA Assay (FDA approved for NASAL specimens in patients 17 years of age and older), is one component of a comprehensive surveillance program. It is not intended to diagnose infection nor to guide or monitor treatment. Performed at [facility], [place] [address], [place],  [address]          Radiology Studies: No results found.      Scheduled Meds: . clopidogrel  75 mg Oral Daily  . enoxaparin (LOVENOX) injection  40 mg Subcutaneous Q24H  . gabapentin  300 mg Oral TID  . nystatin  5 mL Oral QID  . pantoprazole  40 mg Oral Daily  . polyethylene glycol  17 g Oral BID  . potassium chloride  40 mEq Oral Daily  . senna-docusate  1 tablet Oral BID  . sodium chloride flush  3 mL Intravenous Q12H   Continuous Infusions: . sodium chloride    . sodium chloride 100 mL/hr at 10/24/18 1149  . magnesium sulfate 1 - 4 g bolus IVPB 2 g (10/24/18 1151)     LOS: 3 days    Time spent: 35 minutes    Elmarie Shiley, MD [facility] Pager [phone]  If 7PM-7AM, please contact night-coverage [email or web address] Password [facility] 10/24/2018, 12:51 PM

## 2018-10-24 NOTE — Progress Notes (Signed)
[facility] Office:  [phone] General Surgery Progress Note   LOS: 3 days  POD -  1 Day Post-Op  Chief Complaint: Rectal pain  Assessment and Plan: 1.  Impacted  Disimpacted 10/23/2018 - D. [name]  Should go home today.  Will sign off.  Will need bowel regimen to go home on.  To talk to pain physician, Dr. Mirna Mires in [place] about cutting back on narcotics.  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 Lind Guest (ENT) did original biopsy in Jan 2019  Txed by Dr. Julien Nordmann 3.  CVA   4.  On Plavix 5.  Chronic pain  On oxycodone 20 mg QID 6.  Dupuytren's contracture of right hand 7.  Right fem pop, endarterectomy - 01/15/2107 - Dr. Donzetta Matters  6.  DVT prophylaxis - lovenox 7.  Power port -  left chest   Principal Problem:   Fecal impaction in rectum [facility]) Active Problems:   History of stroke   Hypokalemia   Non-small cell carcinoma of right lung, stage 4 (HCC)   Fecal impaction of rectum (HCC)   Chronic pain  Subjective:  Feels much better.  Anticipates going home.  I spoke to her daughterMinette Headland, [phone], by phone  Objective:   Vitals:   10/23/18 2131 10/24/18 0432  BP:  120/62  Pulse: 84 60  Resp:  18  Temp:  98.4 F (36.9 C)  SpO2: 96% 100%     Intake/Output from previous day:  11/23 0701 - 11/24 0700 In: 1372.9 [I.V.:1372.9] Out: 10 [Blood:10]  Intake/Output this shift:  Total I/O In: 240 [P.O.:240] Out: -    Physical Exam:   General: Thin AA F who is alert and oriented.  Her speech pattern is hard to understand   HEENT: Normal. Pupils equal.   Abdomen: Soft.  No tenderness.     Rectum much better.   Lab Results:    Recent Labs    10/22/18 0811 10/23/18 0922  WBC 9.6 7.9  HGB 12.3 11.8*  HCT 36.5 35.6*  PLT 179 163    BMET   Recent Labs    10/22/18 0811 10/23/18 0922  NA 139 141  K 3.8 3.6  CL 109 112*  CO2 24 21*  GLUCOSE 98 71  BUN 6* 11  CREATININE 0.47  0.54  CALCIUM 8.8* 9.0    PT/INR  No results for input(s): LABPROT, INR in the last 72 hours.  ABG  No results for input(s): PHART, HCO3 in the last 72 hours.  Invalid input(s): PCO2, PO2   Studies/Results:  No results found.   Anti-infectives:   Anti-infectives (From admission, onward)   None      Alphonsa Overall, MD, FACS Pager: [facility] Office: [phone] 10/24/2018

## 2018-10-24 NOTE — Progress Notes (Signed)
Incentive Spirometer given to patient with instructions on how to use.  Patient returned demonstration.

## 2018-10-25 LAB — MAGNESIUM: MAGNESIUM: 1.8 mg/dL (ref 1.7–2.4)

## 2018-10-25 LAB — BASIC METABOLIC PANEL
Anion gap: 4 — ABNORMAL LOW (ref 5–15)
BUN: 7 mg/dL — AB (ref 8–23)
CALCIUM: 8.3 mg/dL — AB (ref 8.9–10.3)
CO2: 25 mmol/L (ref 22–32)
CREATININE: 0.41 mg/dL — AB (ref 0.44–1.00)
Chloride: 111 mmol/L (ref 98–111)
GFR calc Af Amer: >60 mL/min (ref >60)
GFR calc non Af Amer: >60 mL/min (ref >60)
Glucose, Bld: 89 mg/dL (ref 70–99)
Potassium: 3.8 mmol/L (ref 3.5–5.1)
SODIUM: 140 mmol/L (ref 135–145)

## 2018-10-25 MED ORDER — GUAIFENESIN-DM 100-10 MG/5ML PO SYRP: 5.0000 mL | ORAL_SOLUTION | ORAL | 0 refills | Status: AC | PRN

## 2018-10-25 MED ORDER — POLYETHYLENE GLYCOL 3350 17 G PO PACK: 17.0000 g | PACK | Freq: Two times a day (BID) | ORAL | 0 refills | Status: AC

## 2018-10-25 MED ORDER — DOXYCYCLINE HYCLATE 50 MG PO CAPS: 100.0000 mg | ORAL_CAPSULE | Freq: Two times a day (BID) | ORAL | 0 refills | Status: AC

## 2018-10-25 MED ORDER — BISACODYL 5 MG PO TBEC
5.0000 mg | DELAYED_RELEASE_TABLET | Freq: Once | ORAL | Status: AC
  Administered 2018-10-25: 5 mg via ORAL
  Filled 2018-10-25: qty 1

## 2018-10-25 MED ORDER — HEPARIN SOD (PORK) LOCK FLUSH 100 UNIT/ML IV SOLN
500.0000 [IU] | INTRAVENOUS | Status: AC | PRN
  Administered 2018-10-25: 500 [IU]

## 2018-10-25 MED ORDER — NYSTATIN 100000 UNIT/ML MT SUSP: 5.0000 mL | Freq: Four times a day (QID) | OROMUCOSAL | 0 refills | Status: AC

## 2018-10-25 MED ORDER — LINACLOTIDE 290 MCG PO CAPS: 290.0000 ug | ORAL_CAPSULE | Freq: Every day | ORAL | 0 refills | Status: AC | PRN

## 2018-10-25 MED ORDER — SENNOSIDES-DOCUSATE SODIUM 8.6-50 MG PO TABS: 1.0000 | ORAL_TABLET | Freq: Two times a day (BID) | ORAL | 0 refills | Status: AC

## 2018-10-25 NOTE — Discharge Summary (Signed)
Physician Discharge Summary  Faith [name] OEV:[id number] DOB: January 28, 1951 DOA: 10/20/2018  PCP: [name], MD  Admit date: 10/20/2018 Discharge date: 10/25/2018  Admitted From: Home  Disposition: Home   Recommendations for Outpatient Follow-up:  1. Follow up with PCP in 1-2 weeks 2. Please obtain BMP/CBC in one week 3. Needs decreases doses of opioids.  4. Needs Bowel regimen.    Discharge Condition: Stable.  CODE STATUS: full code.  Diet recommendation: Heart Healthy   Brief/Interim Summary: Brief Narrative: 67 year old with past medical history significant for stroke, chronic pain, stage IV carcinoma of the lung and right neck who presents complaining of rectal pain, difficulty passing stool. She also reports a history of some loose stool. ED physician performed a rectal exam and patient was noted to be impacted. Manual  desimpaction was not successful. KUB without obstruction. Patient has not had a significant bowel movement, CT abdomen showed  a stool ball in the rectum measuring 7.3 by 4.9 by 6.2 cm. GI was consulted, Dr. [name] evaluated the patient and performed a rectal exam. He is recommending surgery consultation. I have consulted surgery.  Patient was taken to the OR, for rectal stool disimpaction by Dr. [name] on 10/23/2018.  Post op, she is feeling better, abdominal pain has improved significantly.    Assessment & Plan:   Principal Problem:   Fecal impaction in rectum [facility]) Active Problems:   History of stroke   Hypokalemia   Non-small cell carcinoma of right lung, stage 4 (HCC)   Fecal impaction of rectum (HCC)   Chronic pain   1-Rectal pain, abdominal pain; fecal impaction;  And has received multiple enema, SMOG, Golytlty. No Bowel movement.   Now on IV dilaudid for pain, morphine was not controlling pain.  CT abdomen showed stool ball.  Evaluated by Dr [name] who recommend surgical consultation. . Surgery consulted.  Patient underwent stool   Disimpaction 11-23.  Pain has improved.  She will need to be on scheduled bowel regimen.  Was advised to follow-up with her pain clinic to have titration down of opioid.  Discharge on miralax, senna.   2- PNA. Chest pain;  Patient developed chest discomfort.  ekg no ST elevation. Chest x ray with effusion, atelectasis. Discharge on doxy to cover for PNA> in a COPD patient.  Improved.  Report cough.    Hypokalemia; Resolved.   3-Stage IV carcinoma with large necrotic right lower neck mass and lung with mediastinal and hilar nodes involvement She is status post palliative radiation and systemic chemotherapy. She is now on Keytruda.   Dehydration, metabolic acidosis.  IV fluids.  Poor oral intake.  Improved.  Hypomagnesemia: IV magnesium. PVCs, bigeminy; noticed post op.  Replaced electrolytes.   Oral thrush; started on  nystatin.   Chronic pain; continue with Voltaren gel, Oxycodone.   History of CVA; Continue with plavix.     Discharge Diagnoses:  Principal Problem:   Fecal impaction in rectum [name]) Active Problems:   History of stroke   Hypokalemia   Non-small cell carcinoma of right lung, stage 4 (HCC)   Fecal impaction of rectum (HCC)   Chronic pain    Discharge Instructions  Discharge Instructions    Diet - low sodium heart healthy   Complete by:  As directed    Increase activity slowly   Complete by:  As directed      Allergies as of 10/25/2018      Reactions   Penicillins Itching, Other (See Comments)   Causes yeast  infections  PATIENT HAS HAD A PCN REACTION WITH IMMEDIATE RASH, FACIAL/TONGUE/THROAT SWELLING, SOB, OR LIGHTHEADEDNESS WITH HYPOTENSION:  #  #  #  YES  #  #  #   Has patient had a PCN reaction causing severe rash involving mucus membranes or skin necrosis: Unk Has patient had a PCN reaction that required hospitalization: No Has patient had a PCN reaction occurring within the last 10 years: #  #  #  YES  #  #  #  If all of the  above answers are "NO", then may proceed with Cephalosporin    Codeine Itching, Other (See Comments)   And yeast infections   Percocet [oxycodone-acetaminophen] Itching   Tolerates with Benadryl      Medication List    STOP taking these medications   acetaminophen 325 MG tablet Commonly known as:  TYLENOL   oxyCODONE-acetaminophen 10-325 MG tablet Commonly known as:  PERCOCET   potassium chloride SA 20 MEQ tablet Commonly known as:  K-DUR,KLOR-CON   prochlorperazine 10 MG tablet Commonly known as:  COMPAZINE   SANTYL ointment Generic drug:  collagenase     TAKE these medications   albuterol (2.5 MG/3ML) 0.083% nebulizer solution Commonly known as:  PROVENTIL Take 2.5 mg by nebulization every 6 (six) hours as needed for wheezing or shortness of breath.   albuterol 108 (90 Base) MCG/ACT inhaler Commonly known as:  PROVENTIL HFA;VENTOLIN HFA Inhale 1-2 puffs into the lungs every 6 (six) hours as needed for wheezing or shortness of breath.   clopidogrel 75 MG tablet Commonly known as:  PLAVIX Take 1 tablet (75 mg total) by mouth daily.   diclofenac sodium 1 % Gel Commonly known as:  VOLTAREN Apply 2 g topically daily as needed (pain).   diphenhydrAMINE 25 MG tablet Commonly known as:  BENADRYL Take 2 tablets (50 mg total) by mouth at bedtime as needed for sleep.   doxycycline 50 MG capsule Commonly known as:  VIBRAMYCIN Take 2 capsules (100 mg total) by mouth 2 (two) times daily for 5 days.   fluticasone 50 MCG/ACT nasal spray Commonly known as:  FLONASE Place 1 spray into both nostrils daily.   furosemide 20 MG tablet Commonly known as:  LASIX Take 20 mg by mouth daily as needed for fluid or edema.   gabapentin 300 MG capsule Commonly known as:  NEURONTIN Take 300 mg by mouth 3 (three) times daily.   guaiFENesin-dextromethorphan 100-10 MG/5ML syrup Commonly known as:  ROBITUSSIN DM Take 5 mLs by mouth every 4 (four) hours as needed for cough.    lidocaine 2 % solution Commonly known as:  XYLOCAINE Caregiver: Mix 1part 2% viscous lidocaine, 1part H20. Swish & swallow 5mL of diluted mixture, 40min before meals and at bedtime, up to QID   lidocaine-prilocaine cream Commonly known as:  EMLA Apply 1 application topically as needed.   linaclotide 290 MCG Caps capsule Commonly known as:  LINZESS Take 1-2 capsules (290-580 mcg total) by mouth daily as needed (constipation).   loratadine 10 MG tablet Commonly known as:  CLARITIN Take 10 mg by mouth daily.   methocarbamol 750 MG tablet Commonly known as:  ROBAXIN Take 750 mg by mouth 2 (two) times daily as needed for muscle spasms.   mometasone 0.1 % ointment Commonly known as:  ELOCON Apply 1 application topically daily as needed (irritation).   nicotine polacrilex 2 MG gum Commonly known as:  NICORETTE Take 2 mg by mouth as needed for smoking cessation.   nystatin  100000 UNIT/ML suspension Commonly known as:  MYCOSTATIN Take 5 mLs (500,000 Units total) by mouth 4 (four) times daily.   omeprazole 20 MG capsule Commonly known as:  PRILOSEC Take 20 mg by mouth 2 (two) times daily before a meal.   Oxycodone HCl 20 MG Tabs Take 1 tablet by mouth 4 (four) times daily as needed (pain).   polyethylene glycol packet Commonly known as:  MIRALAX / GLYCOLAX Take 17 g by mouth 2 (two) times daily. What changed:  when to take this   potassium chloride 20 MEQ/15ML (10%) Soln Take 30 mLs by mouth daily.   senna-docusate 8.6-50 MG tablet Commonly known as:  Senokot-S Take 1 tablet by mouth 2 (two) times daily.   Vitamin D (Ergocalciferol) 1.25 MG (50000 UT) Caps capsule Commonly known as:  DRISDOL Take 50,000 Units by mouth every 7 (seven) days.       Allergies  Allergen Reactions  . Penicillins Itching and Other (See Comments)    Causes yeast infections  PATIENT HAS HAD A PCN REACTION WITH IMMEDIATE RASH, FACIAL/TONGUE/THROAT SWELLING, SOB, OR LIGHTHEADEDNESS WITH  HYPOTENSION:  #  #  #  YES  #  #  #   Has patient had a PCN reaction causing severe rash involving mucus membranes or skin necrosis: Unk Has patient had a PCN reaction that required hospitalization: No Has patient had a PCN reaction occurring within the last 10 years: #  #  #  YES  #  #  #  If all of the above answers are "NO", then may proceed with Cephalosporin   . Codeine Itching and Other (See Comments)    And yeast infections  . Percocet [Oxycodone-Acetaminophen] Itching    Tolerates with Benadryl    Consultations: Surgery  GI   Procedures/Studies: Dg Chest 2 View  Result Date: 10/24/2018 CLINICAL DATA:  Disimpaction surgery today. EXAM: CHEST - 2 VIEW COMPARISON:  October 20, 2018 FINDINGS: The left Port-A-Cath is stable. There is a small right effusion with underlying atelectasis, new. No other interval changes or acute abnormalities. IMPRESSION: New small right effusion with underlying atelectasis. No other changes. Electronically Signed   By: Dorise Bullion III M.D   On: 10/24/2018 14:25   Ct Abdomen Pelvis W Contrast  Result Date: 10/21/2018 CLINICAL DATA:  Rectal pain. EXAM: CT ABDOMEN AND PELVIS WITH CONTRAST TECHNIQUE: Multidetector CT imaging of the abdomen and pelvis was performed using the standard protocol following bolus administration of intravenous contrast. CONTRAST:  17mL OMNIPAQUE IOHEXOL 300 MG/ML SOLN, 75mL OMNIPAQUE IOHEXOL 300 MG/ML SOLN COMPARISON:  September 21, 2018 FINDINGS: Lower chest: The patient's known malignancy is only partially imaged in the right lower chest on today's study. There has been no significant change of the visualized portions of the known malignancy and surrounding mild opacities since September 21, 2018. No other change to the lower chest. Hepatobiliary: Small cysts and low-attenuation lesions in the liver, unchanged since previous studies and considered benign. No suspicious liver masses. The portal vein is patent. The patient is status  post cholecystectomy. Intra and extrahepatic biliary duct dilatation persistent is slightly more prominent in the interval. Pancreas: Unremarkable. No pancreatic ductal dilatation or surrounding inflammatory changes. Spleen: Normal in size without focal abnormality. Adrenals/Urinary Tract: The right renal cyst is again identified, unchanged. Kidneys, adrenal glands, ureters, and bladder are otherwise normal. Stomach/Bowel: The stomach is normal. No small bowel obstruction identified. There is a stool ball in the rectum measuring 7.3 by 4.9 by 6.2 cm. There  is rectal wall thickening and suspected subtle adjacent fat stranding. There is significant fecal loading in the colon from the cecum through the distal transverse colon. The patient is status post appendectomy. Vascular/Lymphatic: Atherosclerotic changes are seen in the nonaneurysmal aorta. No adenopathy. Reproductive: Status post hysterectomy. No adnexal masses. Other: No abdominal wall hernia or abnormality. No abdominopelvic ascites. Musculoskeletal: No acute bony changes. IMPRESSION: 1. The patient's symptoms are due to a large stool ball in the rectum which is resulting in mild rectal wall thickening and subtle adjacent fat stranding. The findings are concerning for early stercoral colitis . There is significant fecal loading more proximally in the colon as above. 2. The patient's known malignancy in the right lower chest is incompletely evaluated but grossly unchanged. 3. Atherosclerotic changes in the abdominal aorta. Electronically Signed   By: Dorise Bullion III M.D   On: 10/21/2018 22:12   Dg Abdomen Acute W/chest  Result Date: 10/20/2018 CLINICAL DATA:  Nausea, vomiting, and generalized abdominal pain for 3 weeks. History of stage IV lung cancer, last treatment for weeks ago. EXAM: DG ABDOMEN ACUTE W/ 1V CHEST COMPARISON:  CT chest abdomen and pelvis 09/21/2018 FINDINGS: Power port type central venous catheter with tip over the low SVC region. No  pneumothorax. Borderline heart size with normal pulmonary vascularity. Volume loss in the right middle lung consistent with known postobstructive change. Prominence of the right hilum corresponding to known neoplasm. Left lung is clear. No blunting of costophrenic angles. No pneumothorax. Degenerative changes in the spine and shoulders. Scattered gas in the colon and small bowel. No small or large bowel distention. Air-fluid levels are in the colon. This may indicate diarrhea process or liquid stool. Stool and residual contrast material in the rectosigmoid colon. No free intra-abdominal air. Surgical clips in the right upper quadrant and right pelvis. Vascular calcifications. Degenerative changes in the spine and hips. IMPRESSION: 1. Volume loss in the right middle lung consistent with known neoplasm. 2. Air-fluid levels in the colon without distention may indicate liquid stool. No evidence of bowel obstruction. Electronically Signed   By: Lucienne Capers M.D.   On: 10/20/2018 21:55      Subjective: Patient feeling better. Denies chest pain. Denies abdominal pain/   Discharge Exam: Vitals:   10/24/18 2104 10/25/18 0648  BP: (!) 126/59 (!) 141/70  Pulse: 68 65  Resp:  16  Temp: 98.5 F (36.9 C) 99 F (37.2 C)  SpO2: 100% 95%   Vitals:   10/24/18 0432 10/24/18 1517 10/24/18 2104 10/25/18 0648  BP: 120/62 (!) 129/56 (!) 126/59 (!) 141/70  Pulse: 60 80 68 65  Resp: 18 18  16   Temp: 98.4 F (36.9 C) 98.2 F (36.8 C) 98.5 F (36.9 C) 99 F (37.2 C)  TempSrc: Oral Oral Oral Oral  SpO2: 100% 100% 100% 95%  Weight:      Height:        General: Pt is alert, awake, not in acute distress Cardiovascular: RRR, S1/S2 +, no rubs, no gallops Respiratory: CTA bilaterally, no wheezing, no rhonchi Abdominal: Soft, NT, ND, bowel sounds + Extremities: no edema, no cyanosis    The results of significant diagnostics from this hospitalization (including imaging, microbiology, ancillary and  laboratory) are listed below for reference.     Microbiology: Recent Results (from the past 240 hour(s))  Gastrointestinal Panel by PCR , Stool     Status: None   Collection Time: 10/20/18  7:32 PM  Result Value Ref Range Status  Campylobacter species NOT DETECTED NOT DETECTED Final   Plesimonas shigelloides NOT DETECTED NOT DETECTED Final   Salmonella species NOT DETECTED NOT DETECTED Final   Yersinia enterocolitica NOT DETECTED NOT DETECTED Final   Vibrio species NOT DETECTED NOT DETECTED Final   Vibrio cholerae NOT DETECTED NOT DETECTED Final   Enteroaggregative E coli (EAEC) NOT DETECTED NOT DETECTED Final   Enteropathogenic E coli (EPEC) NOT DETECTED NOT DETECTED Final   Enterotoxigenic E coli (ETEC) NOT DETECTED NOT DETECTED Final   Shiga like toxin producing E coli (STEC) NOT DETECTED NOT DETECTED Final   Shigella/Enteroinvasive E coli (EIEC) NOT DETECTED NOT DETECTED Final   Cryptosporidium NOT DETECTED NOT DETECTED Final   Cyclospora cayetanensis NOT DETECTED NOT DETECTED Final   Entamoeba histolytica NOT DETECTED NOT DETECTED Final   Giardia lamblia NOT DETECTED NOT DETECTED Final   Adenovirus F40/41 NOT DETECTED NOT DETECTED Final   Astrovirus NOT DETECTED NOT DETECTED Final   Norovirus GI/GII NOT DETECTED NOT DETECTED Final   Rotavirus A NOT DETECTED NOT DETECTED Final   Sapovirus (I, II, IV, and V) NOT DETECTED NOT DETECTED Final    Comment: Performed at [facility], [address] [place], [place] [address]  Surgical PCR screen     Status: None   Collection Time: 10/23/18  1:49 PM  Result Value Ref Range Status   MRSA, PCR NEGATIVE NEGATIVE Final   Staphylococcus aureus NEGATIVE NEGATIVE Final    Comment: (NOTE) The Xpert SA Assay (FDA approved for NASAL specimens in patients 20 years of age and older), is one component of a comprehensive surveillance program. It is not intended to diagnose infection nor to guide or monitor treatment. Performed at  [facility], [place] [address] [place], [place] [address]      Labs: BNP (last 3 results) No results for input(s): BNP in the last 8760 hours. Basic Metabolic Panel: Recent Labs  Lab 10/21/18 0500 10/22/18 0811 10/23/18 0922 10/24/18 0850 10/24/18 1121 10/25/18 0415  NA 138 139 141  --  142 140  K 3.6 3.8 3.6  --  4.1 3.8  CL 107 109 112*  --  114* 111  CO2 21* 24 21*  --  23 25  GLUCOSE 118* 98 71  --  132* 89  BUN 12 6* 11  --  12 7*  CREATININE 0.51 0.47 0.54  --  0.57 0.41*  CALCIUM 9.2 8.8* 9.0  --  8.8* 8.3*  MG  --   --   --  1.6*  --  1.8   Liver Function Tests: Recent Labs  Lab 10/20/18 1807  AST 21  ALT 12  ALKPHOS 92  BILITOT 1.1  PROT 8.8*  ALBUMIN 4.9   Recent Labs  Lab 10/20/18 1807  LIPASE 21   No results for input(s): AMMONIA in the last 168 hours. CBC: Recent Labs  Lab 10/20/18 1807 10/21/18 0500 10/22/18 0811 10/23/18 0922  WBC 9.5 10.4 9.6 7.9  NEUTROABS  --  7.4  --   --   HGB 14.4 12.9 12.3 11.8*  HCT 43.0 38.9 36.5 35.6*  MCV 86.7 87.8 88.0 88.3  PLT 230 194 179 163   Cardiac Enzymes: Recent Labs  Lab 10/24/18 1121  TROPONINI <0.03   BNP: Invalid input(s): POCBNP CBG: No results for input(s): GLUCAP in the last 168 hours. D-Dimer No results for input(s): DDIMER in the last 72 hours. Hgb A1c No results for input(s): HGBA1C in the last 72 hours. Lipid  Profile No results for input(s): CHOL, HDL, LDLCALC, TRIG, CHOLHDL, LDLDIRECT in the last 72 hours. Thyroid function studies No results for input(s): TSH, T4TOTAL, T3FREE, THYROIDAB in the last 72 hours.  Invalid input(s): FREET3 Anemia work up No results for input(s): VITAMINB12, FOLATE, FERRITIN, TIBC, IRON, RETICCTPCT in the last 72 hours. Urinalysis    Component Value Date/Time   COLORURINE STRAW (A) 01/13/2017 1213   APPEARANCEUR CLEAR 01/13/2017 1213   LABSPEC 1.009 01/13/2017 1213   PHURINE 6.0 01/13/2017 1213   GLUCOSEU NEGATIVE  01/13/2017 1213   HGBUR NEGATIVE 01/13/2017 1213   BILIRUBINUR NEGATIVE 01/13/2017 1213   KETONESUR NEGATIVE 01/13/2017 1213   PROTEINUR NEGATIVE 01/13/2017 1213   UROBILINOGEN 0.2 09/11/2015 1300   NITRITE NEGATIVE 01/13/2017 1213   LEUKOCYTESUR NEGATIVE 01/13/2017 1213   Sepsis Labs Invalid input(s): PROCALCITONIN,  WBC,  LACTICIDVEN Microbiology Recent Results (from the past 240 hour(s))  Gastrointestinal Panel by PCR , Stool     Status: None   Collection Time: 10/20/18  7:32 PM  Result Value Ref Range Status   Campylobacter species NOT DETECTED NOT DETECTED Final   Plesimonas shigelloides NOT DETECTED NOT DETECTED Final   Salmonella species NOT DETECTED NOT DETECTED Final   Yersinia enterocolitica NOT DETECTED NOT DETECTED Final   Vibrio species NOT DETECTED NOT DETECTED Final   Vibrio cholerae NOT DETECTED NOT DETECTED Final   Enteroaggregative E coli (EAEC) NOT DETECTED NOT DETECTED Final   Enteropathogenic E coli (EPEC) NOT DETECTED NOT DETECTED Final   Enterotoxigenic E coli (ETEC) NOT DETECTED NOT DETECTED Final   Shiga like toxin producing E coli (STEC) NOT DETECTED NOT DETECTED Final   Shigella/Enteroinvasive E coli (EIEC) NOT DETECTED NOT DETECTED Final   Cryptosporidium NOT DETECTED NOT DETECTED Final   Cyclospora cayetanensis NOT DETECTED NOT DETECTED Final   Entamoeba histolytica NOT DETECTED NOT DETECTED Final   Giardia lamblia NOT DETECTED NOT DETECTED Final   Adenovirus F40/41 NOT DETECTED NOT DETECTED Final   Astrovirus NOT DETECTED NOT DETECTED Final   Norovirus GI/GII NOT DETECTED NOT DETECTED Final   Rotavirus A NOT DETECTED NOT DETECTED Final   Sapovirus (I, II, IV, and V) NOT DETECTED NOT DETECTED Final    Comment: Performed at [facility], [address] [place], [place] [address]  Surgical PCR screen     Status: None   Collection Time: 10/23/18  1:49 PM  Result Value Ref Range Status   MRSA, PCR NEGATIVE NEGATIVE Final   Staphylococcus  aureus NEGATIVE NEGATIVE Final    Comment: (NOTE) The Xpert SA Assay (FDA approved for NASAL specimens in patients 48 years of age and older), is one component of a comprehensive surveillance program. It is not intended to diagnose infection nor to guide or monitor treatment. Performed at [facility], [place] [address], [place],  [address]      Time coordinating discharge: 35 minutes.   SIGNED:   Elmarie Shiley, MD  [facility] 10/25/2018, 3:29 PM Pager   If 7PM-7AM, please contact night-coverage [email or web address] Password [email or web address]

## 2018-10-25 NOTE — Care Management Note (Signed)
Case Management Note  Patient Details  Name: Faith [name] MRN: [id number] Date of Birth: 1951-07-01  Subjective/Objective:                  discharged  Action/Plan: Discharged to home with self-care and family support No CM needs present at time of discharge.  Expected Discharge Date:  10/25/18               Expected Discharge Plan:     In-House Referral:     Discharge planning Services     Post Acute Care Choice:    Choice offered to:     DME Arranged:    DME Agency:     HH Arranged:    HH Agency:     Status of Service:     If discussed at [employer], dates discussed:    Additional Comments:  Leeroy Cha, RN 10/25/2018, 9:46 AM

## 2018-11-01 ENCOUNTER — Encounter (HOSPITAL_BASED_OUTPATIENT_CLINIC_OR_DEPARTMENT_OTHER): Payer: Medicare Other | Attending: Internal Medicine

## 2018-11-01 ENCOUNTER — Telehealth: Payer: Self-pay | Admitting: Nutrition

## 2018-11-01 DIAGNOSIS — Z9221 Personal history of antineoplastic chemotherapy: Secondary | ICD-10-CM | POA: Insufficient documentation

## 2018-11-01 DIAGNOSIS — T8131XA Disruption of external operation (surgical) wound, not elsewhere classified, initial encounter: Secondary | ICD-10-CM | POA: Insufficient documentation

## 2018-11-01 DIAGNOSIS — Y838 Other surgical procedures as the cause of abnormal reaction of the patient, or of later complication, without mention of misadventure at the time of the procedure: Secondary | ICD-10-CM | POA: Insufficient documentation

## 2018-11-01 DIAGNOSIS — C3490 Malignant neoplasm of unspecified part of unspecified bronchus or lung: Secondary | ICD-10-CM | POA: Insufficient documentation

## 2018-11-01 DIAGNOSIS — C7989 Secondary malignant neoplasm of other specified sites: Secondary | ICD-10-CM | POA: Insufficient documentation

## 2018-11-01 NOTE — Telephone Encounter (Signed)
I returned patient's call.  Patient had inquired about samples of Ensure Enlive and coupons. Left a message on patient's telephone that I would be happy to provide her with samples and coupons.  I asked that she let me know when she was coming so I can have it ready for her.  My phone number provided.

## 2018-11-04 ENCOUNTER — Encounter: Payer: Self-pay | Admitting: Internal Medicine

## 2018-11-04 ENCOUNTER — Telehealth: Payer: Self-pay | Admitting: Internal Medicine

## 2018-11-04 ENCOUNTER — Inpatient Hospital Stay: Payer: Medicare Other

## 2018-11-04 ENCOUNTER — Inpatient Hospital Stay: Payer: Medicare Other | Attending: Internal Medicine

## 2018-11-04 ENCOUNTER — Inpatient Hospital Stay (HOSPITAL_BASED_OUTPATIENT_CLINIC_OR_DEPARTMENT_OTHER): Payer: Medicare Other | Admitting: Internal Medicine

## 2018-11-04 VITALS — BP 131/64 | HR 75 | Temp 98.6°F | Resp 17 | Ht 62.0 in | Wt 114.2 lb

## 2018-11-04 DIAGNOSIS — C3491 Malignant neoplasm of unspecified part of right bronchus or lung: Secondary | ICD-10-CM

## 2018-11-04 DIAGNOSIS — Z5112 Encounter for antineoplastic immunotherapy: Secondary | ICD-10-CM | POA: Insufficient documentation

## 2018-11-04 DIAGNOSIS — K5900 Constipation, unspecified: Secondary | ICD-10-CM | POA: Diagnosis not present

## 2018-11-04 DIAGNOSIS — I10 Essential (primary) hypertension: Secondary | ICD-10-CM

## 2018-11-04 DIAGNOSIS — C7989 Secondary malignant neoplasm of other specified sites: Secondary | ICD-10-CM | POA: Insufficient documentation

## 2018-11-04 DIAGNOSIS — Z95828 Presence of other vascular implants and grafts: Secondary | ICD-10-CM

## 2018-11-04 DIAGNOSIS — C770 Secondary and unspecified malignant neoplasm of lymph nodes of head, face and neck: Secondary | ICD-10-CM

## 2018-11-04 DIAGNOSIS — R634 Abnormal weight loss: Secondary | ICD-10-CM

## 2018-11-04 DIAGNOSIS — R5382 Chronic fatigue, unspecified: Secondary | ICD-10-CM

## 2018-11-04 LAB — CBC WITH DIFFERENTIAL (CANCER CENTER ONLY)
Abs Immature Granulocytes: 0.03 10*3/uL (ref 0.00–0.07)
BASOS PCT: 0 %
Basophils Absolute: 0 10*3/uL (ref 0.0–0.1)
Eosinophils Absolute: 0.1 10*3/uL (ref 0.0–0.5)
Eosinophils Relative: 1 %
HCT: 36.1 % (ref 36.0–46.0)
Hemoglobin: 12.4 g/dL (ref 12.0–15.0)
Immature Granulocytes: 0 %
LYMPHS ABS: 2.3 10*3/uL (ref 0.7–4.0)
Lymphocytes Relative: 27 %
MCH: 28.9 pg (ref 26.0–34.0)
MCHC: 34.3 g/dL (ref 30.0–36.0)
MCV: 84.1 fL (ref 80.0–100.0)
MONOS PCT: 9 %
Monocytes Absolute: 0.7 10*3/uL (ref 0.1–1.0)
Neutro Abs: 5.4 10*3/uL (ref 1.7–7.7)
Neutrophils Relative %: 63 %
PLATELETS: 227 10*3/uL (ref 150–400)
RBC: 4.29 MIL/uL (ref 3.87–5.11)
RDW: 14.8 % (ref 11.5–15.5)
WBC Count: 8.6 10*3/uL (ref 4.0–10.5)
nRBC: 0 % (ref 0.0–0.2)

## 2018-11-04 LAB — CMP (CANCER CENTER ONLY)
ALBUMIN: 3.6 g/dL (ref 3.5–5.0)
ALK PHOS: 107 U/L (ref 38–126)
ALT: 7 U/L (ref 0–44)
AST: 11 U/L — ABNORMAL LOW (ref 15–41)
Anion gap: 7 (ref 5–15)
BILIRUBIN TOTAL: 0.5 mg/dL (ref 0.3–1.2)
BUN: 13 mg/dL (ref 8–23)
CO2: 20 mmol/L — ABNORMAL LOW (ref 22–32)
Calcium: 9.9 mg/dL (ref 8.9–10.3)
Chloride: 109 mmol/L (ref 98–111)
Creatinine: 0.71 mg/dL (ref 0.44–1.00)
GFR, Est AFR Am: >60 mL/min (ref >60)
GFR, Estimated: >60 mL/min (ref >60)
GLUCOSE: 96 mg/dL (ref 70–99)
Potassium: 4 mmol/L (ref 3.5–5.1)
Sodium: 136 mmol/L (ref 135–145)
TOTAL PROTEIN: 7.3 g/dL (ref 6.5–8.1)

## 2018-11-04 LAB — TSH: TSH: 4.805 u[IU]/mL — ABNORMAL HIGH (ref 0.308–3.960)

## 2018-11-04 MED ORDER — LACTULOSE 20 GM/30ML PO SOLN: ORAL | 0 refills | Status: DC

## 2018-11-04 MED ORDER — SODIUM CHLORIDE 0.9 % IV SOLN
200.0000 mg | Freq: Once | INTRAVENOUS | Status: AC
  Administered 2018-11-04: 200 mg via INTRAVENOUS
  Filled 2018-11-04: qty 8

## 2018-11-04 MED ORDER — SODIUM CHLORIDE 0.9% FLUSH
10.0000 mL | INTRAVENOUS | Status: DC | PRN
  Administered 2018-11-04: 10 mL
  Filled 2018-11-04: qty 10

## 2018-11-04 MED ORDER — SODIUM CHLORIDE 0.9% FLUSH
10.0000 mL | Freq: Once | INTRAVENOUS | Status: AC
  Administered 2018-11-04: 10 mL
  Filled 2018-11-04: qty 10

## 2018-11-04 MED ORDER — HEPARIN SOD (PORK) LOCK FLUSH 100 UNIT/ML IV SOLN
500.0000 [IU] | Freq: Once | INTRAVENOUS | Status: AC | PRN
  Administered 2018-11-04: 500 [IU]
  Filled 2018-11-04: qty 5

## 2018-11-04 MED ORDER — SODIUM CHLORIDE 0.9 % IV SOLN
Freq: Once | INTRAVENOUS | Status: AC
  Administered 2018-11-04: 11:00:00 via INTRAVENOUS
  Filled 2018-11-04: qty 250

## 2018-11-04 NOTE — Patient Instructions (Signed)
[facility] Discharge Instructions for Patients Receiving Chemotherapy  Today you received the following chemotherapy agents :  Keytruda.  To help prevent nausea and vomiting after your treatment, we encourage you to take your nausea medication as prescribed.   If you develop nausea and vomiting that is not controlled by your nausea medication, call the clinic.   BELOW ARE SYMPTOMS THAT SHOULD BE REPORTED IMMEDIATELY:  *FEVER GREATER THAN 100.5 F  *CHILLS WITH OR WITHOUT FEVER  NAUSEA AND VOMITING THAT IS NOT CONTROLLED WITH YOUR NAUSEA MEDICATION  *UNUSUAL SHORTNESS OF BREATH  *UNUSUAL BRUISING OR BLEEDING  TENDERNESS IN MOUTH AND THROAT WITH OR WITHOUT PRESENCE OF ULCERS  *URINARY PROBLEMS  *BOWEL PROBLEMS  UNUSUAL RASH Items with * indicate a potential emergency and should be followed up as soon as possible.  Feel free to call the clinic should you have any questions or concerns. The clinic phone number is [phone].  Please show the CHEMO ALERT CARD at check-in to the Emergency Department and triage nurse.  

## 2018-11-04 NOTE — Telephone Encounter (Signed)
Printed calendar and avs. °

## 2018-11-04 NOTE — Progress Notes (Signed)
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  She presented with large central necrotic right lower neck mass in addition to bulky mediastinal and bilateral hilar adenopathy as well as a small hypermetabolic lung lesion diagnosed in February 2019.  PRIOR THERAPY: Palliative radiotherapy to the large necrotic mass of the right neck under the care of Dr. Isidore Moos, completed on February 19, 2018.  CURRENT THERAPY: Palliative systemic chemotherapy with carboplatin for AUC of 5, paclitaxel 175 mg/M2 and Keytruda 200 mg IV every 3 weeks.  First dose 03/03/2018.  Status post 4 cycles.  He is currently on treatment with single agent Keytruda status post 6 cycles  INTERVAL HISTORY: Faith [name] 67 y.o. female returns to the clinic today for follow-up visit.  The patient is feeling better today with no concerning complaints except for persistent constipation.  She was admitted to [facility] 3 weeks ago with abdominal pain and constipation.  She underwent fecal disimpaction with some improvement.  She is currently on MiraLAX on daily basis but the patient continues to have constipation.  She denied having any chest pain, shortness of breath, cough or hemoptysis.  She denied having any fever or chills.  She has no nausea, vomiting or diarrhea.  The patient lost around 8 pounds in the last few weeks.  She missed the last dose of her treatment with immunotherapy because of the hospitalization.  She is here today for evaluation before resuming her treatment with single agent Keytruda.  MEDICAL HISTORY: Past Medical History:  Diagnosis Date  . Anxiety   . Arthritis    "thighs; legs; hips" (07/05/2014)  . Bipolar disorder ([place])   . Cholelithiasis 07/2014  . Chronic bronchitis ([place])    "get it q yr"  . Chronic lower back pain   . Depression    pt. use to go to depression clinic, states she still has depression & anxiety but can't get to appt. so she hasn't had any med. for it in a while   . Dyspnea   . GERD (gastroesophageal reflux disease)   . High cholesterol   . History of radiation therapy 02/01/18- 02/12/18   Right Neck/ 30 Gy in 10 fractions.   . Hypertension   . Mass in neck 12/2017   RIGHT SIDE OF NECK  . Peripheral vascular disease ([place])   . Schizophrenia ([place])   . Stroke [facility])     ALLERGIES:  is allergic to penicillins; codeine; and percocet [oxycodone-acetaminophen].  MEDICATIONS:  Current Outpatient Medications  Medication Sig Dispense Refill  . albuterol (PROVENTIL HFA;VENTOLIN HFA) 108 (90 Base) MCG/ACT inhaler Inhale 1-2 puffs into the lungs every 6 (six) hours as needed for wheezing or shortness of breath.    Marland Kitchen albuterol (PROVENTIL) (2.5 MG/3ML) 0.083% nebulizer solution Take 2.5 mg by nebulization every 6 (six) hours as needed for wheezing or shortness of breath.    . clopidogrel (PLAVIX) 75 MG tablet Take 1 tablet (75 mg total) by mouth daily. 30 tablet 0  . diclofenac sodium (VOLTAREN) 1 % GEL Apply 2 g topically daily as needed (pain).     Marland Kitchen diphenhydrAMINE (BENADRYL) 25 MG tablet Take 2 tablets (50 mg total) by mouth at bedtime as needed for sleep. 20 tablet 0  . fluticasone (FLONASE) 50 MCG/ACT nasal spray Place  1 spray into both nostrils daily.   0  . furosemide (LASIX) 20 MG tablet Take 20 mg by mouth daily as needed for fluid or edema.     . gabapentin (NEURONTIN) 300 MG capsule Take 300 mg by mouth 3 (three) times daily.     Marland Kitchen guaiFENesin-dextromethorphan (ROBITUSSIN DM) 100-10 MG/5ML syrup Take 5 mLs by mouth every 4 (four) hours as needed for cough. 118 mL 0  . lidocaine (XYLOCAINE) 2 % solution Caregiver: Mix 1part 2% viscous lidocaine, 1part H20. Swish & swallow 35mL of diluted mixture, 68min before meals and at bedtime, up to QID 100 mL 5    . lidocaine-prilocaine (EMLA) cream Apply 1 application topically as needed. 30 g 3  . linaclotide (LINZESS) 290 MCG CAPS capsule Take 1-2 capsules (290-580 mcg total) by mouth daily as needed (constipation). 30 capsule 0  . loratadine (CLARITIN) 10 MG tablet Take 10 mg by mouth daily.    . methocarbamol (ROBAXIN) 750 MG tablet Take 750 mg by mouth 2 (two) times daily as needed for muscle spasms.     . mometasone (ELOCON) 0.1 % ointment Apply 1 application topically daily as needed (irritation).     . nicotine polacrilex (NICORETTE) 2 MG gum Take 2 mg by mouth as needed for smoking cessation.    Marland Kitchen nystatin (MYCOSTATIN) 100000 UNIT/ML suspension Take 5 mLs (500,000 Units total) by mouth 4 (four) times daily. 60 mL 0  . omeprazole (PRILOSEC) 20 MG capsule Take 20 mg by mouth 2 (two) times daily before a meal.    . Oxycodone HCl 20 MG TABS Take 1 tablet by mouth 4 (four) times daily as needed (pain).   0  . polyethylene glycol (MIRALAX / GLYCOLAX) packet Take 17 g by mouth 2 (two) times daily. 14 each 0  . potassium chloride 20 MEQ/15ML (10%) SOLN Take 30 mLs by mouth daily.    Marland Kitchen senna-docusate (SENOKOT-S) 8.6-50 MG tablet Take 1 tablet by mouth 2 (two) times daily. 30 tablet 0  . Vitamin D, Ergocalciferol, (DRISDOL) 50000 units CAPS capsule Take 50,000 Units by mouth every 7 (seven) days.     No current facility-administered medications for this visit.    Facility-Administered Medications Ordered in Other Visits  Medication Dose Route Frequency Provider Last Rate Last Dose  . heparin lock flush 100 unit/mL  500 Units Intracatheter Once PRN Curt Bears, MD      . sodium chloride flush (NS) 0.9 % injection 10 mL  10 mL Intracatheter PRN Curt Bears, MD        SURGICAL HISTORY:  Past Surgical History:  Procedure Laterality Date  . ABDOMINAL AORTOGRAM N/A 01/07/2017   Procedure: Abdominal Aortogram;  Surgeon: Waynetta Sandy, MD;  Location: [facility];  Service:  Cardiovascular;  Laterality: N/A;  . ABDOMINAL HYSTERECTOMY    . ANKLE FRACTURE SURGERY Right   . ANKLE HARDWARE REMOVAL Right   . APPENDECTOMY  ~ 1963  . BREAST BIOPSY Bilateral   . BREAST CYST EXCISION Bilateral    "not cancer"  . CATARACT EXTRACTION W/ INTRAOCULAR LENS  IMPLANT, BILATERAL    . CHOLECYSTECTOMY N/A 07/05/2014   Procedure: LAPAROSCOPIC CHOLECYSTECTOMY WITH INTRAOPERATIVE CHOLANGIOGRAM;  Surgeon: Gayland Curry, MD;  Location: [facility];  Service: General;  Laterality: N/A;  . EXCISIONAL HEMORRHOIDECTOMY    . FEMORAL-POPLITEAL BYPASS GRAFT Right 01/15/2017   Procedure: BYPASS GRAFT FEMORAL-POPLITEAL ARTERY RIGHT LEG;  Surgeon: Waynetta Sandy, MD;  Location: [facility];  Service: Vascular;  Laterality:  Right;  . IR IMAGING GUIDED PORT INSERTION  03/08/2018  . IR US GUIDE VASC ACCESS LEFT  03/08/2018  . LAPAROSCOPIC CHOLECYSTECTOMY  07/05/2014  . LOWER EXTREMITY ANGIOGRAPHY Bilateral 01/07/2017   Procedure: Lower Extremity Angiography;  Surgeon: Waynetta Sandy, MD;  Location: [facility];  Service: Cardiovascular;  Laterality: Bilateral;  . MASS BIOPSY Right 12/25/2017   Procedure: INCISIONAL RIGHT NECK MASS BIOPSY;  Surgeon: Helayne Seminole, MD;  Location: [facility];  Service: ENT;  Laterality: Right;  . MULTIPLE TOOTH EXTRACTIONS  05/2013   "pulled my upper teeth"  . PILONIDAL CYST EXCISION      REVIEW OF SYSTEMS:  A comprehensive review of systems was negative except for: Constitutional: positive for fatigue and weight loss Gastrointestinal: positive for constipation   PHYSICAL EXAMINATION: General appearance: alert, cooperative, fatigued and no distress Head: Normocephalic, without obvious abnormality, atraumatic Neck: no carotid bruit, supple, symmetrical, trachea midline, thyroid not enlarged, symmetric, no tenderness/mass/nodules and Open wound in the right neck area secondary to previous radiation Lymph nodes: Cervical adenopathy: Enlargement right  supraclavicular lymphadenopathy Resp: clear to auscultation bilaterally Back: symmetric, no curvature. ROM normal. No CVA tenderness. Cardio: regular rate and rhythm, S1, S2 normal, no murmur, click, rub or gallop GI: soft, non-tender; bowel sounds normal; no masses,  no organomegaly Extremities: extremities normal, atraumatic, no cyanosis or edema  ECOG PERFORMANCE STATUS: 1 - Symptomatic but completely ambulatory  Blood pressure 131/64, pulse 75, temperature 98.6 F (37 C), temperature source Oral, resp. rate 17, height 5\' 2"  (1.575 m), weight 114 lb 3.2 oz (51.8 kg).  LABORATORY DATA: Lab Results  Component Value Date   WBC 8.6 11/04/2018   HGB 12.4 11/04/2018   HCT 36.1 11/04/2018   MCV 84.1 11/04/2018   PLT 227 11/04/2018      Chemistry      Component Value Date/Time   NA 136 11/04/2018 0909   K 4.0 11/04/2018 0909   CL 109 11/04/2018 0909   CO2 20 (L) 11/04/2018 0909   BUN 13 11/04/2018 0909   CREATININE 0.71 11/04/2018 0909      Component Value Date/Time   CALCIUM 9.9 11/04/2018 0909   ALKPHOS 107 11/04/2018 0909   AST 11 (L) 11/04/2018 0909   ALT 7 11/04/2018 0909   BILITOT 0.5 11/04/2018 0909       RADIOGRAPHIC STUDIES: Dg Chest 2 View  Result Date: 10/24/2018 CLINICAL DATA:  Disimpaction surgery today. EXAM: CHEST - 2 VIEW COMPARISON:  October 20, 2018 FINDINGS: The left Port-A-Cath is stable. There is a small right effusion with underlying atelectasis, new. No other interval changes or acute abnormalities. IMPRESSION: New small right effusion with underlying atelectasis. No other changes. Electronically Signed   By: Dorise Bullion III M.D   On: 10/24/2018 14:25   Ct Abdomen Pelvis W Contrast  Result Date: 10/21/2018 CLINICAL DATA:  Rectal pain. EXAM: CT ABDOMEN AND PELVIS WITH CONTRAST TECHNIQUE: Multidetector CT imaging of the abdomen and pelvis was performed using the standard protocol following bolus administration of intravenous contrast. CONTRAST:   142mL OMNIPAQUE IOHEXOL 300 MG/ML SOLN, 56mL OMNIPAQUE IOHEXOL 300 MG/ML SOLN COMPARISON:  September 21, 2018 FINDINGS: Lower chest: The patient's known malignancy is only partially imaged in the right lower chest on today's study. There has been no significant change of the visualized portions of the known malignancy and surrounding mild opacities since September 21, 2018. No other change to the lower chest. Hepatobiliary: Small cysts and low-attenuation lesions in the liver, unchanged  since previous studies and considered benign. No suspicious liver masses. The portal vein is patent. The patient is status post cholecystectomy. Intra and extrahepatic biliary duct dilatation persistent is slightly more prominent in the interval. Pancreas: Unremarkable. No pancreatic ductal dilatation or surrounding inflammatory changes. Spleen: Normal in size without focal abnormality. Adrenals/Urinary Tract: The right renal cyst is again identified, unchanged. Kidneys, adrenal glands, ureters, and bladder are otherwise normal. Stomach/Bowel: The stomach is normal. No small bowel obstruction identified. There is a stool ball in the rectum measuring 7.3 by 4.9 by 6.2 cm. There is rectal wall thickening and suspected subtle adjacent fat stranding. There is significant fecal loading in the colon from the cecum through the distal transverse colon. The patient is status post appendectomy. Vascular/Lymphatic: Atherosclerotic changes are seen in the nonaneurysmal aorta. No adenopathy. Reproductive: Status post hysterectomy. No adnexal masses. Other: No abdominal wall hernia or abnormality. No abdominopelvic ascites. Musculoskeletal: No acute bony changes. IMPRESSION: 1. The patient's symptoms are due to a large stool ball in the rectum which is resulting in mild rectal wall thickening and subtle adjacent fat stranding. The findings are concerning for early stercoral colitis . There is significant fecal loading more proximally in the colon as  above. 2. The patient's known malignancy in the right lower chest is incompletely evaluated but grossly unchanged. 3. Atherosclerotic changes in the abdominal aorta. Electronically Signed   By: Dorise Bullion III M.D   On: 10/21/2018 22:12   Dg Abdomen Acute W/chest  Result Date: 10/20/2018 CLINICAL DATA:  Nausea, vomiting, and generalized abdominal pain for 3 weeks. History of stage IV lung cancer, last treatment for weeks ago. EXAM: DG ABDOMEN ACUTE W/ 1V CHEST COMPARISON:  CT chest abdomen and pelvis 09/21/2018 FINDINGS: Power port type central venous catheter with tip over the low SVC region. No pneumothorax. Borderline heart size with normal pulmonary vascularity. Volume loss in the right middle lung consistent with known postobstructive change. Prominence of the right hilum corresponding to known neoplasm. Left lung is clear. No blunting of costophrenic angles. No pneumothorax. Degenerative changes in the spine and shoulders. Scattered gas in the colon and small bowel. No small or large bowel distention. Air-fluid levels are in the colon. This may indicate diarrhea process or liquid stool. Stool and residual contrast material in the rectosigmoid colon. No free intra-abdominal air. Surgical clips in the right upper quadrant and right pelvis. Vascular calcifications. Degenerative changes in the spine and hips. IMPRESSION: 1. Volume loss in the right middle lung consistent with known neoplasm. 2. Air-fluid levels in the colon without distention may indicate liquid stool. No evidence of bowel obstruction. Electronically Signed   By: Lucienne Capers M.D.   On: 10/20/2018 21:55    ASSESSMENT AND PLAN: This is a very pleasant 67 years old African-American female with metastatic poorly differentiated carcinoma highly suspicious for primary lung cancer versus head and neck cancer.  She is status post palliative radiotherapy to the large right neck mass. The patient is currently undergoing systemic  chemotherapy with carboplatin, paclitaxel and Keytruda status post 4 cycles.   The patient is currently on treatment with single agent Keytruda status post 6 cycles.  She has been tolerating this treatment well. I recommended for her to proceed with cycle #7 today as scheduled. For constipation I will start the patient on lactulose on as-needed basis.  She was also advised to use magnesium citrate if she has no bowel movement for 2-3 days. I will see the patient back for follow-up  visit in 3 weeks for evaluation before starting cycle #8. For pain management she will continue with the current pain medication with OxyContin and Percocet. The patient was advised to call immediately if she has any concerning symptoms in the interval. The patient voices understanding of current disease status and treatment options and is in agreement with the current care plan.  All questions were answered. The patient knows to call the clinic with any problems, questions or concerns. We can certainly see the patient much sooner if necessary.  Disclaimer: This note was dictated with voice recognition software. Similar sounding words can inadvertently be transcribed and may not be corrected upon review.

## 2018-11-08 DIAGNOSIS — Y838 Other surgical procedures as the cause of abnormal reaction of the patient, or of later complication, without mention of misadventure at the time of the procedure: Secondary | ICD-10-CM | POA: Diagnosis not present

## 2018-11-08 DIAGNOSIS — C7989 Secondary malignant neoplasm of other specified sites: Secondary | ICD-10-CM | POA: Diagnosis not present

## 2018-11-08 DIAGNOSIS — T8131XA Disruption of external operation (surgical) wound, not elsewhere classified, initial encounter: Secondary | ICD-10-CM | POA: Diagnosis present

## 2018-11-08 DIAGNOSIS — Z9221 Personal history of antineoplastic chemotherapy: Secondary | ICD-10-CM | POA: Diagnosis not present

## 2018-11-08 DIAGNOSIS — C3490 Malignant neoplasm of unspecified part of unspecified bronchus or lung: Secondary | ICD-10-CM | POA: Diagnosis not present

## 2018-11-11 ENCOUNTER — Encounter: Payer: Self-pay | Admitting: Nutrition

## 2018-11-11 NOTE — Progress Notes (Signed)
Mailed coupons to patient at home address.

## 2018-11-16 ENCOUNTER — Other Ambulatory Visit: Payer: Self-pay | Admitting: Internal Medicine

## 2018-11-17 ENCOUNTER — Telehealth: Payer: Self-pay | Admitting: Medical Oncology

## 2018-11-17 NOTE — Telephone Encounter (Signed)
Constipation- LBM 6 days ago. She took lactulose and linzess. Nurse got order from PCP for suppository. No further action needed.

## 2018-11-23 ENCOUNTER — Other Ambulatory Visit: Payer: Self-pay | Admitting: Internal Medicine

## 2018-11-26 ENCOUNTER — Inpatient Hospital Stay: Payer: Medicare Other

## 2018-11-26 ENCOUNTER — Encounter: Payer: Self-pay | Admitting: Internal Medicine

## 2018-11-26 ENCOUNTER — Telehealth: Payer: Self-pay | Admitting: Internal Medicine

## 2018-11-26 ENCOUNTER — Inpatient Hospital Stay (HOSPITAL_BASED_OUTPATIENT_CLINIC_OR_DEPARTMENT_OTHER): Payer: Medicare Other | Admitting: Internal Medicine

## 2018-11-26 VITALS — BP 138/63 | HR 80 | Temp 97.9°F | Resp 17 | Ht 62.0 in | Wt 116.9 lb

## 2018-11-26 DIAGNOSIS — K5900 Constipation, unspecified: Secondary | ICD-10-CM

## 2018-11-26 DIAGNOSIS — C3491 Malignant neoplasm of unspecified part of right bronchus or lung: Secondary | ICD-10-CM

## 2018-11-26 DIAGNOSIS — C7989 Secondary malignant neoplasm of other specified sites: Secondary | ICD-10-CM | POA: Diagnosis not present

## 2018-11-26 DIAGNOSIS — Z5112 Encounter for antineoplastic immunotherapy: Secondary | ICD-10-CM

## 2018-11-26 DIAGNOSIS — Z95828 Presence of other vascular implants and grafts: Secondary | ICD-10-CM

## 2018-11-26 DIAGNOSIS — I10 Essential (primary) hypertension: Secondary | ICD-10-CM | POA: Diagnosis not present

## 2018-11-26 DIAGNOSIS — C3490 Malignant neoplasm of unspecified part of unspecified bronchus or lung: Secondary | ICD-10-CM

## 2018-11-26 DIAGNOSIS — R5382 Chronic fatigue, unspecified: Secondary | ICD-10-CM

## 2018-11-26 LAB — CMP (CANCER CENTER ONLY)
ALBUMIN: 3.4 g/dL — AB (ref 3.5–5.0)
ALT: 10 U/L (ref 0–44)
ANION GAP: 8 (ref 5–15)
AST: 12 U/L — ABNORMAL LOW (ref 15–41)
Alkaline Phosphatase: 106 U/L (ref 38–126)
BILIRUBIN TOTAL: 0.4 mg/dL (ref 0.3–1.2)
BUN: 11 mg/dL (ref 8–23)
CO2: 23 mmol/L (ref 22–32)
Calcium: 10.8 mg/dL — ABNORMAL HIGH (ref 8.9–10.3)
Chloride: 108 mmol/L (ref 98–111)
Creatinine: 0.65 mg/dL (ref 0.44–1.00)
GFR, Est AFR Am: >60 mL/min (ref >60)
GFR, Estimated: >60 mL/min (ref >60)
GLUCOSE: 114 mg/dL — AB (ref 70–99)
Potassium: 4.1 mmol/L (ref 3.5–5.1)
SODIUM: 139 mmol/L (ref 135–145)
TOTAL PROTEIN: 7.2 g/dL (ref 6.5–8.1)

## 2018-11-26 LAB — CBC WITH DIFFERENTIAL (CANCER CENTER ONLY)
ABS IMMATURE GRANULOCYTES: 0.02 10*3/uL (ref 0.00–0.07)
BASOS ABS: 0 10*3/uL (ref 0.0–0.1)
BASOS PCT: 0 %
Eosinophils Absolute: 0.1 10*3/uL (ref 0.0–0.5)
Eosinophils Relative: 1 %
HCT: 35.4 % — ABNORMAL LOW (ref 36.0–46.0)
Hemoglobin: 12 g/dL (ref 12.0–15.0)
IMMATURE GRANULOCYTES: 0 %
Lymphocytes Relative: 23 %
Lymphs Abs: 1.8 10*3/uL (ref 0.7–4.0)
MCH: 28.9 pg (ref 26.0–34.0)
MCHC: 33.9 g/dL (ref 30.0–36.0)
MCV: 85.3 fL (ref 80.0–100.0)
MONOS PCT: 9 %
Monocytes Absolute: 0.8 10*3/uL (ref 0.1–1.0)
NEUTROS ABS: 5.4 10*3/uL (ref 1.7–7.7)
NRBC: 0 % (ref 0.0–0.2)
Neutrophils Relative %: 67 %
PLATELETS: 200 10*3/uL (ref 150–400)
RBC: 4.15 MIL/uL (ref 3.87–5.11)
RDW: 14.8 % (ref 11.5–15.5)
WBC Count: 8.1 10*3/uL (ref 4.0–10.5)

## 2018-11-26 LAB — TSH: TSH: 3.207 u[IU]/mL (ref 0.308–3.960)

## 2018-11-26 MED ORDER — LACTULOSE 10 GM/15ML PO SOLN: ORAL | 0 refills | Status: AC

## 2018-11-26 MED ORDER — SODIUM CHLORIDE 0.9% FLUSH
10.0000 mL | Freq: Once | INTRAVENOUS | Status: AC
  Administered 2018-11-26: 10 mL
  Filled 2018-11-26: qty 10

## 2018-11-26 MED ORDER — SODIUM CHLORIDE 0.9% FLUSH
10.0000 mL | INTRAVENOUS | Status: DC | PRN
  Administered 2018-11-26: 10 mL
  Filled 2018-11-26: qty 10

## 2018-11-26 MED ORDER — HEPARIN SOD (PORK) LOCK FLUSH 100 UNIT/ML IV SOLN
500.0000 [IU] | Freq: Once | INTRAVENOUS | Status: AC | PRN
  Administered 2018-11-26: 500 [IU]
  Filled 2018-11-26: qty 5

## 2018-11-26 MED ORDER — SODIUM CHLORIDE 0.9 % IV SOLN
200.0000 mg | Freq: Once | INTRAVENOUS | Status: AC
  Administered 2018-11-26: 200 mg via INTRAVENOUS
  Filled 2018-11-26: qty 8

## 2018-11-26 MED ORDER — SODIUM CHLORIDE 0.9 % IV SOLN
Freq: Once | INTRAVENOUS | Status: AC
  Administered 2018-11-26: 11:00:00 via INTRAVENOUS
  Filled 2018-11-26: qty 250

## 2018-11-26 NOTE — Progress Notes (Signed)
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  She presented with large central necrotic right lower neck mass in addition to bulky mediastinal and bilateral hilar adenopathy as well as a small hypermetabolic lung lesion diagnosed in February 2019.  PRIOR THERAPY: Palliative radiotherapy to the large necrotic mass of the right neck under the care of Dr. Isidore Moos, completed on February 19, 2018.  CURRENT THERAPY: Palliative systemic chemotherapy with carboplatin for AUC of 5, paclitaxel 175 mg/M2 and Keytruda 200 mg IV every 3 weeks.  First dose 03/03/2018.  Status post 4 cycles.  He is currently on treatment with single agent Keytruda status post 7 cycles  INTERVAL HISTORY: Faith [name] 67 y.o. female returns to the clinic today for follow-up visit accompanied by her daughter.  The patient is feeling fine today with no concerning complaints except for persistent constipation.  She has to use lactulose on daily basis for bowel movement.  She denied having any current nausea, vomiting or diarrhea.  She has no chest pain, shortness of breath, cough or hemoptysis.  She has no concerning weight loss or night sweats.  She continues to tolerate her treatment with Keytruda fairly well.  The patient is here today for evaluation before starting cycle #8.  MEDICAL HISTORY: Past Medical History:  Diagnosis Date  . Anxiety   . Arthritis    "thighs; legs; hips" (07/05/2014)  . Bipolar disorder ([place])   . Cholelithiasis 07/2014  . Chronic bronchitis ([place])    "get it q yr"  . Chronic lower back pain   . Depression    pt. use to go to depression clinic, states she still has depression & anxiety but can't get to appt. so she hasn't had any med. for it in a while   . Dyspnea   . GERD  (gastroesophageal reflux disease)   . High cholesterol   . History of radiation therapy 02/01/18- 02/12/18   Right Neck/ 30 Gy in 10 fractions.   . Hypertension   . Mass in neck 12/2017   RIGHT SIDE OF NECK  . Peripheral vascular disease ([place])   . Schizophrenia ([place])   . Stroke [facility])     ALLERGIES:  is allergic to penicillins; codeine; and percocet [oxycodone-acetaminophen].  MEDICATIONS:  Current Outpatient Medications  Medication Sig Dispense Refill  . albuterol (PROVENTIL HFA;VENTOLIN HFA) 108 (90 Base) MCG/ACT inhaler Inhale 1-2 puffs into the lungs every 6 (six) hours as needed for wheezing or shortness of breath.    Marland Kitchen albuterol (PROVENTIL) (2.5 MG/3ML) 0.083% nebulizer solution Take 2.5 mg by nebulization every 6 (six) hours as needed for wheezing or shortness of breath.    . clopidogrel (PLAVIX) 75 MG tablet Take 1 tablet (75 mg total) by mouth daily. 30 tablet 0  . diclofenac sodium (VOLTAREN) 1 % GEL Apply 2 g topically daily as needed (pain).     Marland Kitchen diphenhydrAMINE (BENADRYL) 25 MG tablet Take 2 tablets (50 mg total) by mouth at bedtime as needed for sleep. 20 tablet 0  . fluticasone (FLONASE) 50 MCG/ACT nasal spray Place 1 spray into both nostrils daily.   0  . furosemide (LASIX) 20 MG tablet Take 20 mg by mouth daily as needed for fluid or edema.     . gabapentin (NEURONTIN) 300 MG capsule Take  300 mg by mouth 3 (three) times daily.     Marland Kitchen guaiFENesin-dextromethorphan (ROBITUSSIN DM) 100-10 MG/5ML syrup Take 5 mLs by mouth every 4 (four) hours as needed for cough. 118 mL 0  . lactulose (CHRONULAC) 10 GM/15ML solution TAKE 30 MLS BY MOUTH DAILY AS NEEDED FOR CONSTIPATION 450 mL 0  . lidocaine (XYLOCAINE) 2 % solution Caregiver: Mix 1part 2% viscous lidocaine, 1part H20. Swish & swallow 62mL of diluted mixture, 57min before meals and at bedtime, up to QID 100 mL 5  . lidocaine-prilocaine (EMLA) cream Apply 1 application topically as needed. 30 g 3  . linaclotide (LINZESS) 290 MCG  CAPS capsule Take 1-2 capsules (290-580 mcg total) by mouth daily as needed (constipation). 30 capsule 0  . loratadine (CLARITIN) 10 MG tablet Take 10 mg by mouth daily.    . methocarbamol (ROBAXIN) 750 MG tablet Take 750 mg by mouth 2 (two) times daily as needed for muscle spasms.     . mometasone (ELOCON) 0.1 % ointment Apply 1 application topically daily as needed (irritation).     . nicotine polacrilex (NICORETTE) 2 MG gum Take 2 mg by mouth as needed for smoking cessation.    Marland Kitchen nystatin (MYCOSTATIN) 100000 UNIT/ML suspension Take 5 mLs (500,000 Units total) by mouth 4 (four) times daily. 60 mL 0  . omeprazole (PRILOSEC) 20 MG capsule Take 20 mg by mouth 2 (two) times daily before a meal.    . Oxycodone HCl 20 MG TABS Take 1 tablet by mouth 4 (four) times daily as needed (pain).   0  . polyethylene glycol (MIRALAX / GLYCOLAX) packet Take 17 g by mouth 2 (two) times daily. 14 each 0  . potassium chloride 20 MEQ/15ML (10%) SOLN Take 30 mLs by mouth daily.    Marland Kitchen senna-docusate (SENOKOT-S) 8.6-50 MG tablet Take 1 tablet by mouth 2 (two) times daily. 30 tablet 0  . Vitamin D, Ergocalciferol, (DRISDOL) 50000 units CAPS capsule Take 50,000 Units by mouth every 7 (seven) days.     No current facility-administered medications for this visit.    Facility-Administered Medications Ordered in Other Visits  Medication Dose Route Frequency Provider Last Rate Last Dose  . heparin lock flush 100 unit/mL  500 Units Intracatheter Once PRN Curt Bears, MD      . sodium chloride flush (NS) 0.9 % injection 10 mL  10 mL Intracatheter PRN Curt Bears, MD        SURGICAL HISTORY:  Past Surgical History:  Procedure Laterality Date  . ABDOMINAL AORTOGRAM N/A 01/07/2017   Procedure: Abdominal Aortogram;  Surgeon: Waynetta Sandy, MD;  Location: [facility];  Service: Cardiovascular;  Laterality: N/A;  . ABDOMINAL HYSTERECTOMY    . ANKLE FRACTURE SURGERY Right   . ANKLE HARDWARE REMOVAL  Right   . APPENDECTOMY  ~ 1963  . BREAST BIOPSY Bilateral   . BREAST CYST EXCISION Bilateral    "not cancer"  . CATARACT EXTRACTION W/ INTRAOCULAR LENS  IMPLANT, BILATERAL    . CHOLECYSTECTOMY N/A 07/05/2014   Procedure: LAPAROSCOPIC CHOLECYSTECTOMY WITH INTRAOPERATIVE CHOLANGIOGRAM;  Surgeon: Gayland Curry, MD;  Location: [facility];  Service: General;  Laterality: N/A;  . EXCISIONAL HEMORRHOIDECTOMY    . FEMORAL-POPLITEAL BYPASS GRAFT Right 01/15/2017   Procedure: BYPASS GRAFT FEMORAL-POPLITEAL ARTERY RIGHT LEG;  Surgeon: Waynetta Sandy, MD;  Location: [facility];  Service: Vascular;  Laterality: Right;  . IR IMAGING GUIDED PORT INSERTION  03/08/2018  . IR US GUIDE VASC ACCESS LEFT  03/08/2018  .  LAPAROSCOPIC CHOLECYSTECTOMY  07/05/2014  . LOWER EXTREMITY ANGIOGRAPHY Bilateral 01/07/2017   Procedure: Lower Extremity Angiography;  Surgeon: Waynetta Sandy, MD;  Location: [facility];  Service: Cardiovascular;  Laterality: Bilateral;  . MASS BIOPSY Right 12/25/2017   Procedure: INCISIONAL RIGHT NECK MASS BIOPSY;  Surgeon: Helayne Seminole, MD;  Location: [facility];  Service: ENT;  Laterality: Right;  . MULTIPLE TOOTH EXTRACTIONS  05/2013   "pulled my upper teeth"  . PILONIDAL CYST EXCISION      REVIEW OF SYSTEMS:  A comprehensive review of systems was negative except for: Constitutional: positive for fatigue Gastrointestinal: positive for constipation   PHYSICAL EXAMINATION: General appearance: alert, cooperative, fatigued and no distress Head: Normocephalic, without obvious abnormality, atraumatic Neck: no carotid bruit, supple, symmetrical, trachea midline, thyroid not enlarged, symmetric, no tenderness/mass/nodules and Open wound in the right neck area secondary to previous radiation Lymph nodes: Cervical adenopathy: Enlargement right supraclavicular lymphadenopathy Resp: clear to auscultation bilaterally Back: symmetric, no curvature. ROM normal. No CVA tenderness. Cardio:  regular rate and rhythm, S1, S2 normal, no murmur, click, rub or gallop GI: soft, non-tender; bowel sounds normal; no masses,  no organomegaly Extremities: extremities normal, atraumatic, no cyanosis or edema  ECOG PERFORMANCE STATUS: 1 - Symptomatic but completely ambulatory  Blood pressure 138/63, pulse 80, temperature 97.9 F (36.6 C), temperature source Oral, resp. rate 17, height 5\' 2"  (1.575 m), weight 116 lb 14.4 oz (53 kg), SpO2 100 %.  LABORATORY DATA: Lab Results  Component Value Date   WBC 8.6 11/04/2018   HGB 12.4 11/04/2018   HCT 36.1 11/04/2018   MCV 84.1 11/04/2018   PLT 227 11/04/2018      Chemistry      Component Value Date/Time   NA 136 11/04/2018 0909   K 4.0 11/04/2018 0909   CL 109 11/04/2018 0909   CO2 20 (L) 11/04/2018 0909   BUN 13 11/04/2018 0909   CREATININE 0.71 11/04/2018 0909      Component Value Date/Time   CALCIUM 9.9 11/04/2018 0909   ALKPHOS 107 11/04/2018 0909   AST 11 (L) 11/04/2018 0909   ALT 7 11/04/2018 0909   BILITOT 0.5 11/04/2018 0909       RADIOGRAPHIC STUDIES: No results found.  ASSESSMENT AND PLAN: This is a very pleasant 67 years old African-American female with metastatic poorly differentiated carcinoma highly suspicious for primary lung cancer versus head and neck cancer.  She is status post palliative radiotherapy to the large right neck mass. The patient is currently undergoing systemic chemotherapy with carboplatin, paclitaxel and Keytruda status post 4 cycles.   The patient is currently on treatment with single agent Keytruda status post 7 cycles.  She has been tolerating this treatment well with no concerning complaints. I recommended for her to proceed with cycle #8 today as scheduled. I will see her back for follow-up visit in 3 weeks for evaluation after repeating CT scan of the neck, chest, abdomen and pelvis for restaging of her disease. For pain management she will continue with the current pain medication with  OxyContin and Percocet. She was advised to call immediately if she has any concerning symptoms in the interval. The patient voices understanding of current disease status and treatment options and is in agreement with the current care plan.  All questions were answered. The patient knows to call the clinic with any problems, questions or concerns. We can certainly see the patient much sooner if necessary.  Disclaimer: This note was dictated with  voice recognition software. Similar sounding words can inadvertently be transcribed and may not be corrected upon review.

## 2018-11-26 NOTE — Telephone Encounter (Signed)
Printed calendar and avs. °

## 2018-12-14 ENCOUNTER — Ambulatory Visit (HOSPITAL_COMMUNITY)
Admission: RE | Admit: 2018-12-14 | Discharge: 2018-12-14 | Disposition: A | Discharge status: Home / Self Care | Payer: Medicare Other | Source: Ambulatory Visit | Attending: Internal Medicine | Admitting: Internal Medicine

## 2018-12-14 DIAGNOSIS — C3490 Malignant neoplasm of unspecified part of unspecified bronchus or lung: Secondary | ICD-10-CM

## 2018-12-14 MED ORDER — SODIUM CHLORIDE (PF) 0.9 % IJ SOLN
INTRAMUSCULAR | Status: AC
  Filled 2018-12-14: qty 50

## 2018-12-14 MED ORDER — HEPARIN SOD (PORK) LOCK FLUSH 100 UNIT/ML IV SOLN
INTRAVENOUS | Status: AC
  Filled 2018-12-14: qty 5

## 2018-12-14 MED ORDER — IOHEXOL 300 MG/ML  SOLN
100.0000 mL | Freq: Once | INTRAMUSCULAR | Status: AC | PRN
  Administered 2018-12-14: 100 mL via INTRAVENOUS

## 2018-12-16 ENCOUNTER — Inpatient Hospital Stay: Payer: Medicare Other | Attending: Internal Medicine | Admitting: Internal Medicine

## 2018-12-16 ENCOUNTER — Inpatient Hospital Stay: Payer: Medicare Other

## 2018-12-16 ENCOUNTER — Encounter: Payer: Self-pay | Admitting: Internal Medicine

## 2018-12-16 ENCOUNTER — Inpatient Hospital Stay: Payer: Medicare Other | Admitting: Nutrition

## 2018-12-16 VITALS — BP 136/69 | HR 84 | Temp 97.9°F | Resp 20 | Ht 62.0 in | Wt 117.3 lb

## 2018-12-16 DIAGNOSIS — C7989 Secondary malignant neoplasm of other specified sites: Secondary | ICD-10-CM

## 2018-12-16 DIAGNOSIS — R1310 Dysphagia, unspecified: Secondary | ICD-10-CM | POA: Insufficient documentation

## 2018-12-16 DIAGNOSIS — R5383 Other fatigue: Secondary | ICD-10-CM

## 2018-12-16 DIAGNOSIS — I10 Essential (primary) hypertension: Secondary | ICD-10-CM | POA: Insufficient documentation

## 2018-12-16 DIAGNOSIS — C770 Secondary and unspecified malignant neoplasm of lymph nodes of head, face and neck: Secondary | ICD-10-CM

## 2018-12-16 DIAGNOSIS — Z5112 Encounter for antineoplastic immunotherapy: Secondary | ICD-10-CM

## 2018-12-16 DIAGNOSIS — C3431 Malignant neoplasm of lower lobe, right bronchus or lung: Secondary | ICD-10-CM | POA: Diagnosis not present

## 2018-12-16 DIAGNOSIS — C3491 Malignant neoplasm of unspecified part of right bronchus or lung: Secondary | ICD-10-CM | POA: Insufficient documentation

## 2018-12-16 DIAGNOSIS — K5900 Constipation, unspecified: Secondary | ICD-10-CM | POA: Diagnosis not present

## 2018-12-16 DIAGNOSIS — R0789 Other chest pain: Secondary | ICD-10-CM | POA: Diagnosis not present

## 2018-12-16 DIAGNOSIS — K5641 Fecal impaction: Secondary | ICD-10-CM

## 2018-12-16 DIAGNOSIS — Z95828 Presence of other vascular implants and grafts: Secondary | ICD-10-CM

## 2018-12-16 DIAGNOSIS — R1319 Other dysphagia: Secondary | ICD-10-CM

## 2018-12-16 LAB — CMP (CANCER CENTER ONLY)
ALT: 8 U/L (ref 0–44)
AST: 11 U/L — AB (ref 15–41)
Albumin: 3 g/dL — ABNORMAL LOW (ref 3.5–5.0)
Alkaline Phosphatase: 91 U/L (ref 38–126)
Anion gap: 8 (ref 5–15)
BUN: 9 mg/dL (ref 8–23)
CHLORIDE: 108 mmol/L (ref 98–111)
CO2: 25 mmol/L (ref 22–32)
CREATININE: 0.6 mg/dL (ref 0.44–1.00)
Calcium: 10.2 mg/dL (ref 8.9–10.3)
GFR, Est AFR Am: >60 mL/min (ref >60)
GFR, Estimated: >60 mL/min (ref >60)
Glucose, Bld: 110 mg/dL — ABNORMAL HIGH (ref 70–99)
POTASSIUM: 3.6 mmol/L (ref 3.5–5.1)
Sodium: 141 mmol/L (ref 135–145)
Total Bilirubin: 0.4 mg/dL (ref 0.3–1.2)
Total Protein: 6.9 g/dL (ref 6.5–8.1)

## 2018-12-16 LAB — CBC WITH DIFFERENTIAL (CANCER CENTER ONLY)
Abs Immature Granulocytes: 0.02 10*3/uL (ref 0.00–0.07)
Basophils Absolute: 0 10*3/uL (ref 0.0–0.1)
Basophils Relative: 0 %
Eosinophils Absolute: 0.2 10*3/uL (ref 0.0–0.5)
Eosinophils Relative: 2 %
HCT: 32.2 % — ABNORMAL LOW (ref 36.0–46.0)
Hemoglobin: 10.9 g/dL — ABNORMAL LOW (ref 12.0–15.0)
IMMATURE GRANULOCYTES: 0 %
Lymphocytes Relative: 23 %
Lymphs Abs: 1.7 10*3/uL (ref 0.7–4.0)
MCH: 28.6 pg (ref 26.0–34.0)
MCHC: 33.9 g/dL (ref 30.0–36.0)
MCV: 84.5 fL (ref 80.0–100.0)
Monocytes Absolute: 0.6 10*3/uL (ref 0.1–1.0)
Monocytes Relative: 8 %
Neutro Abs: 5 10*3/uL (ref 1.7–7.7)
Neutrophils Relative %: 67 %
Platelet Count: 263 10*3/uL (ref 150–400)
RBC: 3.81 MIL/uL — ABNORMAL LOW (ref 3.87–5.11)
RDW: 14 % (ref 11.5–15.5)
WBC Count: 7.6 10*3/uL (ref 4.0–10.5)
nRBC: 0 % (ref 0.0–0.2)

## 2018-12-16 MED ORDER — SODIUM CHLORIDE 0.9% FLUSH
10.0000 mL | Freq: Once | INTRAVENOUS | Status: AC
  Administered 2018-12-16: 10 mL
  Filled 2018-12-16: qty 10

## 2018-12-16 NOTE — Progress Notes (Signed)
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  She presented with large central necrotic right lower neck mass in addition to bulky mediastinal and bilateral hilar adenopathy as well as a small hypermetabolic lung lesion diagnosed in February 2019.  PRIOR THERAPY: Palliative radiotherapy to the large necrotic mass of the right neck under the care of Dr. Isidore Moos, completed on February 19, 2018.  CURRENT THERAPY: Palliative systemic chemotherapy with carboplatin for AUC of 5, paclitaxel 175 mg/M2 and Keytruda 200 mg IV every 3 weeks.  First dose 03/03/2018.  Status post 4 cycles.  He is currently on treatment with single agent Keytruda status post 8 cycles  INTERVAL HISTORY: Faith [name] 68 y.o. female returns to the clinic today for follow-up visit.  The patient is complaining of increasing fatigue and weakness as well as constipation.  She also having recent dysphagia.  She was also complaining of substernal chest pain.  She denied having any shortness of breath, cough or hemoptysis.  She denied having any fever or chills.  She has no nausea, vomiting or abdominal pain.  She denied having any recent weight loss or night sweats.  She has been tolerating her treatment with Keytruda fairly well.  She had repeat CT scan of the chest, neck, abdomen and pelvis performed recently and she is here for evaluation and discussion of her scan results and treatment options.  MEDICAL HISTORY: Past Medical History:  Diagnosis Date  . Anxiety   . Arthritis    "thighs; legs; hips" (07/05/2014)  . Bipolar disorder ([place])   . Cholelithiasis 07/2014  . Chronic bronchitis ([place])    "get it q yr"  . Chronic lower back pain   . Depression    pt. use to go to depression clinic, states she still has  depression & anxiety but can't get to appt. so she hasn't had any med. for it in a while   . Dyspnea   . GERD (gastroesophageal reflux disease)   . High cholesterol   . History of radiation therapy 02/01/18- 02/12/18   Right Neck/ 30 Gy in 10 fractions.   . Hypertension   . Mass in neck 12/2017   RIGHT SIDE OF NECK  . Peripheral vascular disease ([place])   . Schizophrenia ([place])   . Stroke [facility])     ALLERGIES:  is allergic to penicillins; codeine; and percocet [oxycodone-acetaminophen].  MEDICATIONS:  Current Outpatient Medications  Medication Sig Dispense Refill  . albuterol (PROVENTIL HFA;VENTOLIN HFA) 108 (90 Base) MCG/ACT inhaler Inhale 1-2 puffs into the lungs every 6 (six) hours as needed for wheezing or shortness of breath.    Marland Kitchen albuterol (PROVENTIL) (2.5 MG/3ML) 0.083% nebulizer solution Take 2.5 mg by nebulization every 6 (six) hours as needed for wheezing or shortness of breath.    . clopidogrel (PLAVIX) 75 MG tablet Take 1 tablet (75 mg total) by mouth daily. 30 tablet 0  . diclofenac sodium (VOLTAREN) 1 % GEL Apply 2 g topically daily as needed (pain).     Marland Kitchen diphenhydrAMINE (BENADRYL) 25 MG tablet Take 2 tablets (50 mg total) by mouth at bedtime as needed for sleep. 20 tablet 0  . fluticasone (FLONASE) 50 MCG/ACT nasal spray Place 1 spray into both nostrils daily.   0  . furosemide (LASIX) 20 MG tablet  Take 20 mg by mouth daily as needed for fluid or edema.     . gabapentin (NEURONTIN) 300 MG capsule Take 300 mg by mouth 3 (three) times daily.     Marland Kitchen guaiFENesin-dextromethorphan (ROBITUSSIN DM) 100-10 MG/5ML syrup Take 5 mLs by mouth every 4 (four) hours as needed for cough. 118 mL 0  . lactulose (CHRONULAC) 10 GM/15ML solution TAKE 30 MLS BY MOUTH DAILY AS NEEDED FOR CONSTIPATION 450 mL 0  . lidocaine (XYLOCAINE) 2 % solution Caregiver: Mix 1part 2% viscous lidocaine, 1part H20. Swish & swallow 40mL of diluted mixture, 7min before meals and at bedtime, up to QID 100 mL 5  .  lidocaine-prilocaine (EMLA) cream Apply 1 application topically as needed. 30 g 3  . linaclotide (LINZESS) 290 MCG CAPS capsule Take 1-2 capsules (290-580 mcg total) by mouth daily as needed (constipation). 30 capsule 0  . loratadine (CLARITIN) 10 MG tablet Take 10 mg by mouth daily.    . methocarbamol (ROBAXIN) 750 MG tablet Take 750 mg by mouth 2 (two) times daily as needed for muscle spasms.     . mometasone (ELOCON) 0.1 % ointment Apply 1 application topically daily as needed (irritation).     . nicotine polacrilex (NICORETTE) 2 MG gum Take 2 mg by mouth as needed for smoking cessation.    Marland Kitchen nystatin (MYCOSTATIN) 100000 UNIT/ML suspension Take 5 mLs (500,000 Units total) by mouth 4 (four) times daily. 60 mL 0  . omeprazole (PRILOSEC) 20 MG capsule Take 20 mg by mouth 2 (two) times daily before a meal.    . Oxycodone HCl 20 MG TABS Take 1 tablet by mouth 4 (four) times daily as needed (pain). Pt taking 10 mg tabs  0  . polyethylene glycol (MIRALAX / GLYCOLAX) packet Take 17 g by mouth 2 (two) times daily. 14 each 0  . potassium chloride 20 MEQ/15ML (10%) SOLN Take 30 mLs by mouth daily.    Marland Kitchen senna-docusate (SENOKOT-S) 8.6-50 MG tablet Take 1 tablet by mouth 2 (two) times daily. 30 tablet 0  . Vitamin D, Ergocalciferol, (DRISDOL) 50000 units CAPS capsule Take 50,000 Units by mouth every 7 (seven) days.     No current facility-administered medications for this visit.    Facility-Administered Medications Ordered in Other Visits  Medication Dose Route Frequency Provider Last Rate Last Dose  . heparin lock flush 100 unit/mL  500 Units Intracatheter Once PRN Curt Bears, MD      . sodium chloride flush (NS) 0.9 % injection 10 mL  10 mL Intracatheter PRN Curt Bears, MD        SURGICAL HISTORY:  Past Surgical History:  Procedure Laterality Date  . ABDOMINAL AORTOGRAM N/A 01/07/2017   Procedure: Abdominal Aortogram;  Surgeon: Waynetta Sandy, MD;  Location: [place] [facility];   Service: Cardiovascular;  Laterality: N/A;  . ABDOMINAL HYSTERECTOMY    . ANKLE FRACTURE SURGERY Right   . ANKLE HARDWARE REMOVAL Right   . APPENDECTOMY  ~ 1963  . BREAST BIOPSY Bilateral   . BREAST CYST EXCISION Bilateral    "not cancer"  . CATARACT EXTRACTION W/ INTRAOCULAR LENS  IMPLANT, BILATERAL    . CHOLECYSTECTOMY N/A 07/05/2014   Procedure: LAPAROSCOPIC CHOLECYSTECTOMY WITH INTRAOPERATIVE CHOLANGIOGRAM;  Surgeon: Gayland Curry, MD;  Location: [place];  Service: General;  Laterality: N/A;  . EXCISIONAL HEMORRHOIDECTOMY    . FEMORAL-POPLITEAL BYPASS GRAFT Right 01/15/2017   Procedure: BYPASS GRAFT FEMORAL-POPLITEAL ARTERY RIGHT LEG;  Surgeon: Waynetta Sandy, MD;  Location:  [facility];  Service: Vascular;  Laterality: Right;  . IR IMAGING GUIDED PORT INSERTION  03/08/2018  . IR US GUIDE VASC ACCESS LEFT  03/08/2018  . LAPAROSCOPIC CHOLECYSTECTOMY  07/05/2014  . LOWER EXTREMITY ANGIOGRAPHY Bilateral 01/07/2017   Procedure: Lower Extremity Angiography;  Surgeon: Waynetta Sandy, MD;  Location: [place] [facility];  Service: Cardiovascular;  Laterality: Bilateral;  . MASS BIOPSY Right 12/25/2017   Procedure: INCISIONAL RIGHT NECK MASS BIOPSY;  Surgeon: Helayne Seminole, MD;  Location: [place];  Service: ENT;  Laterality: Right;  . MULTIPLE TOOTH EXTRACTIONS  05/2013   "pulled my upper teeth"  . PILONIDAL CYST EXCISION      REVIEW OF SYSTEMS:  Constitutional: positive for fatigue Eyes: negative Ears, nose, mouth, throat, and face: negative Respiratory: positive for dyspnea on exertion Cardiovascular: negative Gastrointestinal: positive for dysphagia Genitourinary:negative Integument/breast: negative Hematologic/lymphatic: negative Musculoskeletal:negative Neurological: negative Behavioral/Psych: negative Endocrine: negative Allergic/Immunologic: negative   PHYSICAL EXAMINATION: General appearance: alert, cooperative, fatigued and no distress Head: Normocephalic, without  obvious abnormality, atraumatic Neck: no carotid bruit, supple, symmetrical, trachea midline, thyroid not enlarged, symmetric, no tenderness/mass/nodules and Open wound in the right neck area secondary to previous radiation Lymph nodes: Cervical adenopathy: Enlargement right supraclavicular lymphadenopathy Resp: clear to auscultation bilaterally Back: symmetric, no curvature. ROM normal. No CVA tenderness. Cardio: regular rate and rhythm, S1, S2 normal, no murmur, click, rub or gallop GI: soft, non-tender; bowel sounds normal; no masses,  no organomegaly Extremities: extremities normal, atraumatic, no cyanosis or edema Neurologic: Alert and oriented X 3, normal strength and tone. Normal symmetric reflexes. Normal coordination and gait  ECOG PERFORMANCE STATUS: 1 - Symptomatic but completely ambulatory  Blood pressure 136/69, pulse 84, temperature 97.9 F (36.6 C), temperature source Oral, resp. rate 20, height 5\' 2"  (1.575 m), weight 117 lb 4.8 oz (53.2 kg), SpO2 99 %.  LABORATORY DATA: Lab Results  Component Value Date   WBC 7.6 12/16/2018   HGB 10.9 (L) 12/16/2018   HCT 32.2 (L) 12/16/2018   MCV 84.5 12/16/2018   PLT 263 12/16/2018      Chemistry      Component Value Date/Time   NA 139 11/26/2018 0914   K 4.1 11/26/2018 0914   CL 108 11/26/2018 0914   CO2 23 11/26/2018 0914   BUN 11 11/26/2018 0914   CREATININE 0.65 11/26/2018 0914      Component Value Date/Time   CALCIUM 10.8 (H) 11/26/2018 0914   ALKPHOS 106 11/26/2018 0914   AST 12 (L) 11/26/2018 0914   ALT 10 11/26/2018 0914   BILITOT 0.4 11/26/2018 0914       RADIOGRAPHIC STUDIES: Ct Soft Tissue Neck W Contrast  Result Date: 12/14/2018 CLINICAL DATA:  68 year old female with non-small cell lung cancer. Right neck metastases. Re-staging. Finished chemotherapy and radiation, on immunotherapy now. Chronic left ICA occlusion. EXAM: CT NECK WITH CONTRAST TECHNIQUE: Multidetector CT imaging of the neck was performed  using the standard protocol following the bolus administration of intravenous contrast. CONTRAST:  11mL OMNIPAQUE IOHEXOL 300 MG/ML  SOLN COMPARISON:  Neck CT 09/21/2018 and earlier. CT Chest, Abdomen, and Pelvis today are reported separately. FINDINGS: Pharynx and larynx: Laryngeal and visible pharyngeal soft tissue contours remain within normal limits. Visible parapharyngeal and retropharyngeal spaces are negative. Salivary glands: Negative sublingual space. Submandibular glands and left parotid gland are stable since August 2019 and within normal limits. Along the posterior inferior margin of the right parotid gland around heterogeneously enhancing soft tissue metastasis has mildly regressed  since October now measuring 8-9 millimeters diameter (series 2, image 25) versus 11 millimeters previously. Otherwise the visible right parotid gland remains normal. Thyroid: Negative. Lymph nodes: Small lymph node metastasis along the posterior margin of the right parotid gland described above. Right posterolateral lower neck treatment site remain satisfactory (series 2, image 40). A 6 millimeter right thoracic inlet/level 4 lymph node on series 2, image 60 has not significantly changed since August. The remaining right neck lymph nodes are stable since August and within normal limits. Contralateral left neck nodes are within normal limits except at the thoracic inlet, supraclavicular station where a 10-13 millimeter short axis node on series 2, image 58 is enlarged but stable. See also coronal image 42. The other regional lymph nodes at the left thoracic inlet are stable since August and within normal limits. Vascular: Chronic occlusion of the left ICA with bulky calcified plaque at its origin. The other major vascular structures in the neck and at the skull base are patent and stable. Chronic left IJ approach porta cath. 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 Limited intracranial: Not  included. Visualized orbits: Not included. Mastoids and visualized paranasal sinuses: Maxillary sinuses partially visible and clear. Skeleton: Motion artifact at the mandible. No acute or suspicious osseous lesion identified in the neck. Upper chest: Reported separately today. IMPRESSION: 1. Mildly smaller since October nodal metastasis along the right parotid inferior margin, now 8-9 mm. 2. Chronically enlarged 13 mm left thoracic inlet/subclavian lymph node is suspicious but stable. Attention is directed on follow-up. 3. Other cervical nodes are within normal limits. Stable and satisfactory post treatment appearance of the right lower neck. 4.  CT Chest, Abdomen, and Pelvis today are reported separately. Electronically Signed   By: Genevie Ann M.D.   On: 12/14/2018 17:20   Ct Chest W Contrast  Result Date: 12/14/2018 CLINICAL DATA:  Lung cancer with brain metastasis. Chemo radiotherapy completed. Immunotherapy ongoing EXAM: CT CHEST, ABDOMEN, AND PELVIS WITH CONTRAST TECHNIQUE: Multidetector CT imaging of the chest, abdomen and pelvis was performed following the standard protocol during bolus administration of intravenous contrast. CONTRAST:  134mL OMNIPAQUE IOHEXOL 300 MG/ML  SOLN COMPARISON:  CT 09/21/2018 FINDINGS: CT CHEST FINDINGS Cardiovascular: Port in the anterior chest wall with tip in distal SVC. No significant vascular findings. Normal heart size. No pericardial effusion. Mediastinum/Nodes: Mass surrounding the RIGHT lower bronchus and extending into the subcarinal nodal station measures 6.3 by 3.4 cm by 4.2 cm (volume = 47 cm^3) increased from 4.9 by 3.0 by 3.1 cm (volume = 24 cm^3). Lungs/Pleura: Collapse of the RIGHT lower lobe associated with the obstructive mass surrounding the RIGHT lobe bronchus. Small RIGHT effusion. LEFT lung small cystic lesion with inferior spiculation. This spiculated portion measures 7 mm on image 55/4). Findings not changed from comparison exam. Musculoskeletal: No  aggressive osseous lesion. CT ABDOMEN AND PELVIS FINDINGS Hepatobiliary: No suspicious hepatic lesion. Cysts within the RIGHT hepatic lobe. Fatty infiltration of the falciform ligament. Small hypodense lesion LEFT lateral hepatic lobe is unchanged. No biliary duct dilatation. Postcholecystectomy. Pancreas: Pancreas is normal. No ductal dilatation. No pancreatic inflammation. Spleen: Normal spleen Adrenals/urinary tract: Adrenal glands and kidneys are normal. The ureters and bladder normal. Stomach/Bowel: Thickening through the gastric cardiac region similar comparison exam thickening extends into the body of the stomach along the greater curvature (image 50/2). Findings similar prior. Small bowel normal. Colon normal. Large stool ball in the rectum measuring 7.7 cm in diameter. Vascular/Lymphatic: Abdominal aorta is normal  caliber with atherosclerotic calcification. There is no retroperitoneal or periportal lymphadenopathy. No pelvic lymphadenopathy. Reproductive: Post hysterectomy Other: No peritoneal metastasis Musculoskeletal: No aggressive osseous lesion. IMPRESSION: Chest Impression: 1. Interval increase in size of mass surrounding the RIGHT lower lobe bronchus and extending into the subcarinal nodal station consistent with progression of lung cancer. 2. Postobstructive collapse within the RIGHT lower lobe. 3. Small RIGHT effusion. Abdomen / Pelvis Impression: 1. No evidence of metastatic disease in the abdomen pelvis. 2. Diffuse thickening the gastric fundus and body along the greater curvature. Recommend correlation with gastritis. 3. Large stool ball increased in size from prior consistent with fecal impaction. These results will be called to the ordering clinician or representative by the Radiologist Assistant, and communication documented in the PACS or zVision Dashboard. Electronically Signed   By: Suzy Bouchard M.D.   On: 12/14/2018 17:23   Ct Abdomen Pelvis W Contrast  Result Date:  12/14/2018 CLINICAL DATA:  Lung cancer with brain metastasis. Chemo radiotherapy completed. Immunotherapy ongoing EXAM: CT CHEST, ABDOMEN, AND PELVIS WITH CONTRAST TECHNIQUE: Multidetector CT imaging of the chest, abdomen and pelvis was performed following the standard protocol during bolus administration of intravenous contrast. CONTRAST:  119mL OMNIPAQUE IOHEXOL 300 MG/ML  SOLN COMPARISON:  CT 09/21/2018 FINDINGS: CT CHEST FINDINGS Cardiovascular: Port in the anterior chest wall with tip in distal SVC. No significant vascular findings. Normal heart size. No pericardial effusion. Mediastinum/Nodes: Mass surrounding the RIGHT lower bronchus and extending into the subcarinal nodal station measures 6.3 by 3.4 cm by 4.2 cm (volume = 47 cm^3) increased from 4.9 by 3.0 by 3.1 cm (volume = 24 cm^3). Lungs/Pleura: Collapse of the RIGHT lower lobe associated with the obstructive mass surrounding the RIGHT lobe bronchus. Small RIGHT effusion. LEFT lung small cystic lesion with inferior spiculation. This spiculated portion measures 7 mm on image 55/4). Findings not changed from comparison exam. Musculoskeletal: No aggressive osseous lesion. CT ABDOMEN AND PELVIS FINDINGS Hepatobiliary: No suspicious hepatic lesion. Cysts within the RIGHT hepatic lobe. Fatty infiltration of the falciform ligament. Small hypodense lesion LEFT lateral hepatic lobe is unchanged. No biliary duct dilatation. Postcholecystectomy. Pancreas: Pancreas is normal. No ductal dilatation. No pancreatic inflammation. Spleen: Normal spleen Adrenals/urinary tract: Adrenal glands and kidneys are normal. The ureters and bladder normal. Stomach/Bowel: Thickening through the gastric cardiac region similar comparison exam thickening extends into the body of the stomach along the greater curvature (image 50/2). Findings similar prior. Small bowel normal. Colon normal. Large stool ball in the rectum measuring 7.7 cm in diameter. Vascular/Lymphatic: Abdominal aorta is  normal caliber with atherosclerotic calcification. There is no retroperitoneal or periportal lymphadenopathy. No pelvic lymphadenopathy. Reproductive: Post hysterectomy Other: No peritoneal metastasis Musculoskeletal: No aggressive osseous lesion. IMPRESSION: Chest Impression: 1. Interval increase in size of mass surrounding the RIGHT lower lobe bronchus and extending into the subcarinal nodal station consistent with progression of lung cancer. 2. Postobstructive collapse within the RIGHT lower lobe. 3. Small RIGHT effusion. Abdomen / Pelvis Impression: 1. No evidence of metastatic disease in the abdomen pelvis. 2. Diffuse thickening the gastric fundus and body along the greater curvature. Recommend correlation with gastritis. 3. Large stool ball increased in size from prior consistent with fecal impaction. These results will be called to the ordering clinician or representative by the Radiologist Assistant, and communication documented in the PACS or zVision Dashboard. Electronically Signed   By: Suzy Bouchard M.D.   On: 12/14/2018 17:23    ASSESSMENT AND PLAN: This is a very pleasant 67 years  old African-American female with metastatic poorly differentiated carcinoma highly suspicious for primary lung cancer versus head and neck cancer.  She is status post palliative radiotherapy to the large right neck mass. The patient is currently undergoing systemic chemotherapy with carboplatin, paclitaxel and Keytruda status post 4 cycles.   The patient is currently on treatment with single agent Keytruda status post 8 cycles.  The patient has been tolerating this treatment well with no concerning adverse effects. She had repeat CT scan of the neck, chest, abdomen and pelvis performed recently.  I personally and independently reviewed the scan images and discussed the results with the patient today. Her scan showed concerning findings for disease progression with increase in the size of the right lower lobe lung  mass and mediastinal lymphadenopathy probably resulting her symptoms with dysphagia and substernal pain. I recommended for the patient to see Dr. Isidore Moos for consideration of palliative radiotherapy to this area. I will hold her current treatment with [facility] for now.  I will assess the patient back for follow-up visit in 6 weeks for reevaluation and recommendation regarding systemic treatment. For pain management she will continue with the current pain medication with OxyContin and Percocet. For constipation she will continue with her current treatment with lactulose, MiraLAX and the patient was also advised to use milk of magnesia if needed. She was advised to call immediately if she has any other concerning symptoms in the interval. The patient voices understanding of current disease status and treatment options and is in agreement with the current care plan.  All questions were answered. The patient knows to call the clinic with any problems, questions or concerns. We can certainly see the patient much sooner if necessary.  Disclaimer: This note was dictated with voice recognition software. Similar sounding words can inadvertently be transcribed and may not be corrected upon review.

## 2018-12-16 NOTE — Progress Notes (Signed)
Nutrition follow-up completed completed with patient after MD visit. Patient is tearful today.  Reports she may have to have radiation therapy secondary to lung cancer.  Immunotherapy is on hold. Weight is stable and documented as 117.3 pounds January 16, stable since November. Patient reports fatigue and weakness, constipation, and dysphasia. She denies nausea and vomiting. Reports she drinks water and Gatorade as well as Ensure Enlive. She is seeking information on foods easier to swallow.  Nutrition diagnosis: Severe malnutrition continues.  Intervention: Patient educated on strategies for consuming softer, easier to swallow foods.  Provided a food list for patient. Educated on strategies for improving constipation in addition to medications specified by MD. Recommended patient continue increased fluids. Provided a complementary case of Ensure Enlive, which her daughter will pick up. Provided patient with coupons.  Questions were answered.  Teach back method used.  Monitoring, evaluation, goals: Patient will tolerate adequate calories and protein to minimize weight loss and improve quality of life.  Next visit: To be scheduled as needed.  Patient has my contact information.  **Disclaimer: This note was dictated with voice recognition software. Similar sounding words can inadvertently be transcribed and this note may contain transcription errors which may not have been corrected upon publication of note.**

## 2018-12-22 ENCOUNTER — Telehealth: Payer: Self-pay | Admitting: Medical Oncology

## 2018-12-22 NOTE — Telephone Encounter (Signed)
Asking for auth to continue to see her x 1 month .

## 2018-12-23 NOTE — Telephone Encounter (Signed)
Ok

## 2018-12-23 NOTE — Progress Notes (Addendum)
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    Location(s) of Symptomatic tumor(s): Right lower lobe of lung.   Past/Anticipated chemotherapy by medical oncology, if any:  12/16/18 Dr. Julien Nordmann ASSESSMENT AND PLAN: This is a very pleasant 68 years old African-American female with metastatic poorly differentiated carcinoma highly suspicious for primary lung cancer versus head and neck cancer.  She is status post palliative radiotherapy to the large right neck mass. The patient is currently undergoing systemic chemotherapy with carboplatin, paclitaxel and Keytruda status post 4 cycles.   The patient is currently on treatment with single agent Keytruda status post 8 cycles.  The patient has been tolerating this treatment well with no concerning adverse effects. She had repeat CT scan of the neck, chest, abdomen and pelvis performed recently.  I personally and independently reviewed the scan images and discussed the results with the patient today. Her scan showed concerning findings for disease progression with increase in the size of the right lower lobe lung mass and mediastinal lymphadenopathy probably resulting her symptoms with dysphagia and substernal pain. I recommended for the patient to see Dr. Isidore Moos for consideration of palliative radiotherapy to this area. I will hold her current treatment with [facility] for now.  I will assess the patient back for follow-up visit in 6 weeks for reevaluation and recommendation regarding systemic treatment. For pain management she will continue with the current pain medication with OxyContin and Percocet. For constipation she will continue with her current treatment with lactulose, MiraLAX and the patient was also advised to use milk of magnesia if needed. She was advised to call immediately if she has any other concerning symptoms in the  interval. The patient voices understanding of current disease status and treatment options and is in agreement with the current care plan.  All questions were answered. The patient knows to call the clinic with any problems, questions or concerns. We can certainly see the patient much sooner if necessary.  Patient's main complaints related to symptomatic tumor(s) are: dysphagia and substernal pain.   Pain on a scale of 0-10 is: She reports pain a 7/10 to the right side of her body.   If Spine Met(s), symptoms, if any, include:  Bowel/Bladder retention or incontinence (please describe): N/A  Numbness or weakness in extremities (please describe): N/A  Current Decadron regimen, if applicable: N/A  Ambulatory status? Walker? Wheelchair?: Ambulatory with a cane.   SAFETY ISSUES: Prior radiation? Yes, Radiation treatment dates:   02/01/2018 - 02/12/2018 Site/dose:   Right Neck / 30 Gy in 10 fractions  Pacemaker/ICD? No  Possible current pregnancy? No  Is the patient on methotrexate? No  Additional Complaints / other details:    BP 138/61 (BP Location: Left Arm, Patient Position: Sitting)   Pulse 68   Temp 98.4 F (36.9 C) (Oral)   Resp 18   Ht '5\' 1"'  (1.549 m)   Wt 115 lb 12.8 oz (52.5 kg)   SpO2 99%   BMI 21.88 kg/m    Wt Readings from Last 3 Encounters:  12/28/18 115 lb 12.8 oz (52.5 kg)  12/16/18 117 lb 4.8 oz (53.2 kg)  11/26/18 116 lb 14.4 oz (53 kg)

## 2018-12-28 ENCOUNTER — Ambulatory Visit
Admission: RE | Admit: 2018-12-28 | Discharge: 2018-12-28 | Disposition: A | Discharge status: Home / Self Care | Payer: Medicare Other | Source: Ambulatory Visit | Attending: Radiation Oncology | Admitting: Radiation Oncology

## 2018-12-28 ENCOUNTER — Other Ambulatory Visit: Payer: Self-pay | Admitting: Radiation Oncology

## 2018-12-28 ENCOUNTER — Encounter: Payer: Self-pay | Admitting: Radiation Oncology

## 2018-12-28 ENCOUNTER — Telehealth: Payer: Self-pay | Admitting: *Deleted

## 2018-12-28 ENCOUNTER — Other Ambulatory Visit: Payer: Self-pay

## 2018-12-28 VITALS — BP 138/61 | HR 68 | Temp 98.4°F | Resp 18 | Ht 61.0 in | Wt 115.8 lb

## 2018-12-28 DIAGNOSIS — I6522 Occlusion and stenosis of left carotid artery: Secondary | ICD-10-CM | POA: Insufficient documentation

## 2018-12-28 DIAGNOSIS — C7931 Secondary malignant neoplasm of brain: Secondary | ICD-10-CM | POA: Insufficient documentation

## 2018-12-28 DIAGNOSIS — Z79899 Other long term (current) drug therapy: Secondary | ICD-10-CM | POA: Insufficient documentation

## 2018-12-28 DIAGNOSIS — K7689 Other specified diseases of liver: Secondary | ICD-10-CM | POA: Insufficient documentation

## 2018-12-28 DIAGNOSIS — E7800 Pure hypercholesterolemia, unspecified: Secondary | ICD-10-CM | POA: Diagnosis not present

## 2018-12-28 DIAGNOSIS — K219 Gastro-esophageal reflux disease without esophagitis: Secondary | ICD-10-CM | POA: Insufficient documentation

## 2018-12-28 DIAGNOSIS — F209 Schizophrenia, unspecified: Secondary | ICD-10-CM | POA: Insufficient documentation

## 2018-12-28 DIAGNOSIS — Z87891 Personal history of nicotine dependence: Secondary | ICD-10-CM | POA: Diagnosis not present

## 2018-12-28 DIAGNOSIS — K5900 Constipation, unspecified: Secondary | ICD-10-CM | POA: Insufficient documentation

## 2018-12-28 DIAGNOSIS — C771 Secondary and unspecified malignant neoplasm of intrathoracic lymph nodes: Secondary | ICD-10-CM | POA: Diagnosis not present

## 2018-12-28 DIAGNOSIS — C3431 Malignant neoplasm of lower lobe, right bronchus or lung: Secondary | ICD-10-CM | POA: Insufficient documentation

## 2018-12-28 DIAGNOSIS — M129 Arthropathy, unspecified: Secondary | ICD-10-CM | POA: Insufficient documentation

## 2018-12-28 DIAGNOSIS — F419 Anxiety disorder, unspecified: Secondary | ICD-10-CM | POA: Insufficient documentation

## 2018-12-28 DIAGNOSIS — Z809 Family history of malignant neoplasm, unspecified: Secondary | ICD-10-CM | POA: Diagnosis not present

## 2018-12-28 DIAGNOSIS — I10 Essential (primary) hypertension: Secondary | ICD-10-CM | POA: Insufficient documentation

## 2018-12-28 DIAGNOSIS — R1310 Dysphagia, unspecified: Secondary | ICD-10-CM | POA: Insufficient documentation

## 2018-12-28 DIAGNOSIS — I739 Peripheral vascular disease, unspecified: Secondary | ICD-10-CM | POA: Diagnosis not present

## 2018-12-28 DIAGNOSIS — Z923 Personal history of irradiation: Secondary | ICD-10-CM | POA: Insufficient documentation

## 2018-12-28 DIAGNOSIS — R0602 Shortness of breath: Secondary | ICD-10-CM | POA: Insufficient documentation

## 2018-12-28 DIAGNOSIS — C770 Secondary and unspecified malignant neoplasm of lymph nodes of head, face and neck: Secondary | ICD-10-CM

## 2018-12-28 DIAGNOSIS — R12 Heartburn: Secondary | ICD-10-CM | POA: Insufficient documentation

## 2018-12-28 DIAGNOSIS — Z8673 Personal history of transient ischemic attack (TIA), and cerebral infarction without residual deficits: Secondary | ICD-10-CM | POA: Insufficient documentation

## 2018-12-28 DIAGNOSIS — C3491 Malignant neoplasm of unspecified part of right bronchus or lung: Secondary | ICD-10-CM

## 2018-12-28 MED ORDER — SUCRALFATE 1 G PO TABS: ORAL_TABLET | ORAL | 5 refills | Status: AC

## 2018-12-28 MED ORDER — SUCRALFATE 1 GM/10ML PO SUSP: 1.0000 g | Freq: Three times a day (TID) | ORAL | 0 refills | Status: AC

## 2018-12-28 NOTE — Progress Notes (Signed)
Radiation Oncology         [phone] ________________________________  Outpatient Re-Consultation  Name: Faith [name] MRN: [id number]  Date: 12/28/2018  DOB: 03-06-1951  YB:[id number], Christean Grief, MD  Curt Bears, MD   REFERRING PHYSICIAN: Curt Bears, MD  DIAGNOSIS:    ICD-10-CM   1. 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 Non-small cell carcinoma of right lung, stage 4 (HCC) C34.91     CHIEF COMPLAINT: Here to discuss management of metastatic lung cancer  HISTORY OF PRESENT ILLNESS::Faith [name] is a 68 y.o. female who is accompanied by her daughter today. She was last seen in our office on 04/02/2018 following palliative radiation therapy to her right neck mass. When I last saw her, she had a necrotic opening over her right neck from radiation. This area has healed well thanks to wound care. She is currently under the care of Dr. Julien Nordmann for chemotherapy. She recently underwent follow up chest/abdomen/pelvis CT scan on 12/14/2018. I have personally reviewed her imaging. This unfortunately showed enlargement of the right lower lobe mass, partial right lung collapse, and mediastinal lymphadenopathy.  She reports dysphagia (though tolerates Ensure) and substernal pain, as well as pain to the right side of her body that she rates a 7/10. Her daughter reports the substernal pain has been present for a few weeks. The patient also reports constipation on and off since November 2019. Pain in mid chest with swallowing - "heartburn."   She has been kindly referred back to [place] to discuss palliative radiation therapy options.    PREVIOUS RADIATION THERAPY: Yes  02/01/2018 - 02/12/2018: Right Neck / 30 Gy in 10 fractions  PAST MEDICAL HISTORY:  has a past medical history of Anxiety, Arthritis, Bipolar disorder ([place]), Cholelithiasis (07/2014), Chronic bronchitis ([place]), Chronic lower back pain,  Depression, Dyspnea, GERD (gastroesophageal reflux disease), High cholesterol, History of radiation therapy (02/01/18- 02/12/18), Hypertension, Mass in neck (12/2017), Peripheral vascular disease ([place]), Schizophrenia ([place]), and Stroke ([place]).    PAST SURGICAL HISTORY: Past Surgical History:  Procedure Laterality Date  . ABDOMINAL AORTOGRAM N/A 01/07/2017   Procedure: Abdominal Aortogram;  Surgeon: Waynetta Sandy, MD;  Location: [facility];  Service: Cardiovascular;  Laterality: N/A;  . ABDOMINAL HYSTERECTOMY    . ANKLE FRACTURE SURGERY Right   . ANKLE HARDWARE REMOVAL Right   . APPENDECTOMY  ~ 1963  . BREAST BIOPSY Bilateral   . BREAST CYST EXCISION Bilateral    "not cancer"  . CATARACT EXTRACTION W/ INTRAOCULAR LENS  IMPLANT, BILATERAL    . CHOLECYSTECTOMY N/A 07/05/2014   Procedure: LAPAROSCOPIC CHOLECYSTECTOMY WITH INTRAOPERATIVE CHOLANGIOGRAM;  Surgeon: Gayland Curry, MD;  Location: [facility];  Service: General;  Laterality: N/A;  . EXCISIONAL HEMORRHOIDECTOMY    . FEMORAL-POPLITEAL BYPASS GRAFT Right 01/15/2017   Procedure: BYPASS GRAFT FEMORAL-POPLITEAL ARTERY RIGHT LEG;  Surgeon: Waynetta Sandy, MD;  Location: [facility];  Service: Vascular;  Laterality: Right;  . IR IMAGING GUIDED PORT INSERTION  03/08/2018  . IR US GUIDE VASC ACCESS LEFT  03/08/2018  . LAPAROSCOPIC CHOLECYSTECTOMY  07/05/2014  . LOWER EXTREMITY ANGIOGRAPHY Bilateral 01/07/2017   Procedure: Lower Extremity Angiography;  Surgeon: Waynetta Sandy, MD;  Location: [facility];  Service: Cardiovascular;  Laterality: Bilateral;  . MASS BIOPSY Right 12/25/2017   Procedure: INCISIONAL RIGHT NECK MASS BIOPSY;  Surgeon: Helayne Seminole, MD;  Location: [facility];  Service: ENT;  Laterality: Right;  . MULTIPLE TOOTH EXTRACTIONS  05/2013   "pulled my upper teeth"  . PILONIDAL CYST EXCISION      FAMILY HISTORY: family history includes Cancer in her father; Cancer (age of onset: 88) in her mother; Diabetes  in her father and mother; Hypertension in her father and mother.  SOCIAL HISTORY:  reports that she quit smoking about 10 months ago. Her smoking use included cigarettes. She has a 48.00 pack-year smoking history. She has never used smokeless tobacco. She reports that she does not drink alcohol or use drugs.  ALLERGIES: Penicillins; Codeine; and Percocet [oxycodone-acetaminophen]  MEDICATIONS:  Current Outpatient Medications  Medication Sig Dispense Refill  . albuterol (PROVENTIL HFA;VENTOLIN HFA) 108 (90 Base) MCG/ACT inhaler Inhale 1-2 puffs into the lungs every 6 (six) hours as needed for wheezing or shortness of breath.    Marland Kitchen albuterol (PROVENTIL) (2.5 MG/3ML) 0.083% nebulizer solution Take 2.5 mg by nebulization every 6 (six) hours as needed for wheezing or shortness of breath.    . AMITIZA 24 MCG capsule TK 1 C PO BID WF    . clopidogrel (PLAVIX) 75 MG tablet Take 1 tablet (75 mg total) by mouth daily. 30 tablet 0  . diclofenac sodium (VOLTAREN) 1 % GEL Apply 2 g topically daily as needed (pain).     Marland Kitchen diphenhydrAMINE (BENADRYL) 25 MG tablet Take 2 tablets (50 mg total) by mouth at bedtime as needed for sleep. 20 tablet 0  . fluticasone (FLONASE) 50 MCG/ACT nasal spray Place 1 spray into both nostrils daily.   0  . furosemide (LASIX) 20 MG tablet Take 20 mg by mouth daily as needed for fluid or edema.     . gabapentin (NEURONTIN) 300 MG capsule Take 300 mg by mouth 3 (three) times daily.     Marland Kitchen guaiFENesin-dextromethorphan (ROBITUSSIN DM) 100-10 MG/5ML syrup Take 5 mLs by mouth every 4 (four) hours as needed for cough. 118 mL 0  . lactulose (CHRONULAC) 10 GM/15ML solution TAKE 30 MLS BY MOUTH DAILY AS NEEDED FOR CONSTIPATION 450 mL 0  . lidocaine (XYLOCAINE) 2 % solution Caregiver: Mix 1part 2% viscous lidocaine, 1part H20. Swish & swallow 44mL of diluted mixture, 34min before meals and at bedtime, up to QID 100 mL 5  . lidocaine-prilocaine (EMLA) cream Apply 1 application topically as  needed. 30 g 3  . linaclotide (LINZESS) 290 MCG CAPS capsule Take 1-2 capsules (290-580 mcg total) by mouth daily as needed (constipation). 30 capsule 0  . loratadine (CLARITIN) 10 MG tablet Take 10 mg by mouth daily.    . methocarbamol (ROBAXIN) 750 MG tablet Take 750 mg by mouth 2 (two) times daily as needed for muscle spasms.     . mometasone (ELOCON) 0.1 % ointment Apply 1 application topically daily as needed (irritation).     . nicotine polacrilex (NICORETTE) 2 MG gum Take 2 mg by mouth as needed for smoking cessation.    Marland Kitchen nystatin (MYCOSTATIN) 100000 UNIT/ML suspension Take 5 mLs (500,000 Units total) by mouth 4 (four) times daily. 60 mL 0  . omeprazole (PRILOSEC) 20 MG capsule Take 20 mg by mouth 2 (two) times daily before a meal.    . Oxycodone HCl 20 MG TABS Take 1 tablet by mouth 4 (four) times daily as needed (pain). Pt taking 10 mg tabs  0  . polyethylene glycol (MIRALAX / GLYCOLAX) packet Take 17 g by mouth 2 (two) times daily. 14 each 0  . potassium chloride 20 MEQ/15ML (10%) SOLN Take  30 mLs by mouth daily.    Marland Kitchen senna-docusate (SENOKOT-S) 8.6-50 MG tablet Take 1 tablet by mouth 2 (two) times daily. 30 tablet 0  . tiZANidine (ZANAFLEX) 4 MG tablet TK 1 T PO  BID PRN    . Vitamin D, Ergocalciferol, (DRISDOL) 50000 units CAPS capsule Take 50,000 Units by mouth every 7 (seven) days.    . Oxycodone HCl 10 MG TABS TK 1 T PO  Q 6 H PRN     No current facility-administered medications for this encounter.    Facility-Administered Medications Ordered in Other Encounters  Medication Dose Route Frequency Provider Last Rate Last Dose  . heparin lock flush 100 unit/mL  500 Units Intracatheter Once PRN Curt Bears, MD      . sodium chloride flush (NS) 0.9 % injection 10 mL  10 mL Intracatheter PRN Curt Bears, MD        REVIEW OF SYSTEMS:  Notable for that above. 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   PHYSICAL EXAM:  height is 5\' 1"  (1.549 m) and  weight is 115 lb 12.8 oz (52.5 kg). Her oral temperature is 98.4 F (36.9 C). Her blood pressure is 138/61 and her pulse is 68. Her respiration is 18 and oxygen saturation is 99%.   General: Alert and oriented, in no acute distress HEENT: Head is normocephalic. Extraocular movements are intact. Oropharynx is clear. Neck: Neck is supple, no palpable cervical or supraclavicular lymphadenopathy. No palpable masses appreciated. Hypopigmented scar in the right lower neck. Heart: Regular in rate and rhythm with no rubs or gallops. Systolic murmur that is loudest in the aortic valve region. Chest: Clear to auscultation bilaterally, with no rhonchi, wheezes, or rales. Extremities: No cyanosis or edema. Skin: No concerning lesions. Healed over right neck/intact. Neurologic: Cranial nerves II through XII are grossly intact. Speech is fluent. Coordination is intact. Left eye deviates laterally. Psychiatric: Judgment and insight are intact. Affect is appropriate.   ECOG = 2  0 - Asymptomatic (Fully active, able to carry on all predisease activities without restriction)  1 - Symptomatic but completely ambulatory (Restricted in physically strenuous activity but ambulatory and able to carry out work of a light or sedentary nature. For example, light housework, office work)  2 - Symptomatic, <50% in bed during the day (Ambulatory and capable of all self care but unable to carry out any work activities. Up and about more than 50% of waking hours)  3 - Symptomatic, >50% in bed, but not bedbound (Capable of only limited self-care, confined to bed or chair 50% or more of waking hours)  4 - Bedbound (Completely disabled. Cannot carry on any self-care. Totally confined to bed or chair)  5 - Death   Eustace Pen MM, Creech RH, Tormey DC, et al. [phone]). "Toxicity and response criteria of the [facility]". [place] Oncol. 5 (6): 649-55   LABORATORY DATA:  Lab Results  Component Value Date     WBC 7.6 12/16/2018   HGB 10.9 (L) 12/16/2018   HCT 32.2 (L) 12/16/2018   MCV 84.5 12/16/2018   PLT 263 12/16/2018   CMP     Component Value Date/Time   NA 141 12/16/2018 0939   K 3.6 12/16/2018 0939   CL 108 12/16/2018 0939   CO2 25 12/16/2018 0939   GLUCOSE 110 (H) 12/16/2018 0939   BUN 9 12/16/2018 0939   CREATININE 0.60 12/16/2018 0939   CALCIUM 10.2 12/16/2018 0939   PROT  6.9 12/16/2018 0939   ALBUMIN 3.0 (L) 12/16/2018 0939   AST 11 (L) 12/16/2018 0939   ALT 8 12/16/2018 0939   ALKPHOS 91 12/16/2018 0939   BILITOT 0.4 12/16/2018 0939   GFRNONAA >60 12/16/2018 0939   GFRAA >60 12/16/2018 0939         RADIOGRAPHY: Ct Soft Tissue Neck W Contrast  Result Date: 12/14/2018 CLINICAL DATA:  68 year old female with non-small cell lung cancer. Right neck metastases. Re-staging. Finished chemotherapy and radiation, on immunotherapy now. Chronic left ICA occlusion. EXAM: CT NECK WITH CONTRAST TECHNIQUE: Multidetector CT imaging of the neck was performed using the standard protocol following the bolus administration of intravenous contrast. CONTRAST:  136mL OMNIPAQUE IOHEXOL 300 MG/ML  SOLN COMPARISON:  Neck CT 09/21/2018 and earlier. CT Chest, Abdomen, and Pelvis today are reported separately. FINDINGS: Pharynx and larynx: Laryngeal and visible pharyngeal soft tissue contours remain within normal limits. Visible parapharyngeal and retropharyngeal spaces are negative. Salivary glands: Negative sublingual space. Submandibular glands and left parotid gland are stable since August 2019 and within normal limits. Along the posterior inferior margin of the right parotid gland around heterogeneously enhancing soft tissue metastasis has mildly regressed since October now measuring 8-9 millimeters diameter (series 2, image 25) versus 11 millimeters previously. Otherwise the visible right parotid gland remains normal. Thyroid: Negative. Lymph nodes: Small lymph node metastasis along the posterior  margin of the right parotid gland described above. Right posterolateral lower neck treatment site remain satisfactory (series 2, image 40). A 6 millimeter right thoracic inlet/level 4 lymph node on series 2, image 60 has not significantly changed since August. The remaining right neck lymph nodes are stable since August and within normal limits. Contralateral left neck nodes are within normal limits except at the thoracic inlet, supraclavicular station where a 10-13 millimeter short axis node on series 2, image 58 is enlarged but stable. See also coronal image 42. The other regional lymph nodes at the left thoracic inlet are stable since August and within normal limits. Vascular: Chronic occlusion of the left ICA with bulky calcified plaque at its origin. The other major vascular structures in the neck and at the skull base are patent and stable. Chronic left IJ approach porta cath. 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 Limited intracranial: Not included. Visualized orbits: Not included. Mastoids and visualized paranasal sinuses: Maxillary sinuses partially visible and clear. Skeleton: Motion artifact at the mandible. No acute or suspicious osseous lesion identified in the neck. Upper chest: Reported separately today. IMPRESSION: 1. Mildly smaller since October nodal metastasis along the right parotid inferior margin, now 8-9 mm. 2. Chronically enlarged 13 mm left thoracic inlet/subclavian lymph node is suspicious but stable. Attention is directed on follow-up. 3. Other cervical nodes are within normal limits. Stable and satisfactory post treatment appearance of the right lower neck. 4.  CT Chest, Abdomen, and Pelvis today are reported separately. Electronically Signed   By: Genevie Ann M.D.   On: 12/14/2018 17:20   Ct Chest W Contrast  Result Date: 12/14/2018 CLINICAL DATA:  Lung cancer with brain metastasis. Chemo radiotherapy completed. Immunotherapy ongoing EXAM: CT CHEST,  ABDOMEN, AND PELVIS WITH CONTRAST TECHNIQUE: Multidetector CT imaging of the chest, abdomen and pelvis was performed following the standard protocol during bolus administration of intravenous contrast. CONTRAST:  139mL OMNIPAQUE IOHEXOL 300 MG/ML  SOLN COMPARISON:  CT 09/21/2018 FINDINGS: CT CHEST FINDINGS Cardiovascular: Port in the anterior chest wall with tip in distal SVC. No significant vascular findings.  Normal heart size. No pericardial effusion. Mediastinum/Nodes: Mass surrounding the RIGHT lower bronchus and extending into the subcarinal nodal station measures 6.3 by 3.4 cm by 4.2 cm (volume = 47 cm^3) increased from 4.9 by 3.0 by 3.1 cm (volume = 24 cm^3). Lungs/Pleura: Collapse of the RIGHT lower lobe associated with the obstructive mass surrounding the RIGHT lobe bronchus. Small RIGHT effusion. LEFT lung small cystic lesion with inferior spiculation. This spiculated portion measures 7 mm on image 55/4). Findings not changed from comparison exam. Musculoskeletal: No aggressive osseous lesion. CT ABDOMEN AND PELVIS FINDINGS Hepatobiliary: No suspicious hepatic lesion. Cysts within the RIGHT hepatic lobe. Fatty infiltration of the falciform ligament. Small hypodense lesion LEFT lateral hepatic lobe is unchanged. No biliary duct dilatation. Postcholecystectomy. Pancreas: Pancreas is normal. No ductal dilatation. No pancreatic inflammation. Spleen: Normal spleen Adrenals/urinary tract: Adrenal glands and kidneys are normal. The ureters and bladder normal. Stomach/Bowel: Thickening through the gastric cardiac region similar comparison exam thickening extends into the body of the stomach along the greater curvature (image 50/2). Findings similar prior. Small bowel normal. Colon normal. Large stool ball in the rectum measuring 7.7 cm in diameter. Vascular/Lymphatic: Abdominal aorta is normal caliber with atherosclerotic calcification. There is no retroperitoneal or periportal lymphadenopathy. No pelvic  lymphadenopathy. Reproductive: Post hysterectomy Other: No peritoneal metastasis Musculoskeletal: No aggressive osseous lesion. IMPRESSION: Chest Impression: 1. Interval increase in size of mass surrounding the RIGHT lower lobe bronchus and extending into the subcarinal nodal station consistent with progression of lung cancer. 2. Postobstructive collapse within the RIGHT lower lobe. 3. Small RIGHT effusion. Abdomen / Pelvis Impression: 1. No evidence of metastatic disease in the abdomen pelvis. 2. Diffuse thickening the gastric fundus and body along the greater curvature. Recommend correlation with gastritis. 3. Large stool ball increased in size from prior consistent with fecal impaction. These results will be called to the ordering clinician or representative by the Radiologist Assistant, and communication documented in the PACS or zVision Dashboard. Electronically Signed   By: Suzy Bouchard M.D.   On: 12/14/2018 17:23   Ct Abdomen Pelvis W Contrast  Result Date: 12/14/2018 CLINICAL DATA:  Lung cancer with brain metastasis. Chemo radiotherapy completed. Immunotherapy ongoing EXAM: CT CHEST, ABDOMEN, AND PELVIS WITH CONTRAST TECHNIQUE: Multidetector CT imaging of the chest, abdomen and pelvis was performed following the standard protocol during bolus administration of intravenous contrast. CONTRAST:  176mL OMNIPAQUE IOHEXOL 300 MG/ML  SOLN COMPARISON:  CT 09/21/2018 FINDINGS: CT CHEST FINDINGS Cardiovascular: Port in the anterior chest wall with tip in distal SVC. No significant vascular findings. Normal heart size. No pericardial effusion. Mediastinum/Nodes: Mass surrounding the RIGHT lower bronchus and extending into the subcarinal nodal station measures 6.3 by 3.4 cm by 4.2 cm (volume = 47 cm^3) increased from 4.9 by 3.0 by 3.1 cm (volume = 24 cm^3). Lungs/Pleura: Collapse of the RIGHT lower lobe associated with the obstructive mass surrounding the RIGHT lobe bronchus. Small RIGHT effusion. LEFT lung  small cystic lesion with inferior spiculation. This spiculated portion measures 7 mm on image 55/4). Findings not changed from comparison exam. Musculoskeletal: No aggressive osseous lesion. CT ABDOMEN AND PELVIS FINDINGS Hepatobiliary: No suspicious hepatic lesion. Cysts within the RIGHT hepatic lobe. Fatty infiltration of the falciform ligament. Small hypodense lesion LEFT lateral hepatic lobe is unchanged. No biliary duct dilatation. Postcholecystectomy. Pancreas: Pancreas is normal. No ductal dilatation. No pancreatic inflammation. Spleen: Normal spleen Adrenals/urinary tract: Adrenal glands and kidneys are normal. The ureters and bladder normal. Stomach/Bowel: Thickening through the gastric cardiac region  similar comparison exam thickening extends into the body of the stomach along the greater curvature (image 50/2). Findings similar prior. Small bowel normal. Colon normal. Large stool ball in the rectum measuring 7.7 cm in diameter. Vascular/Lymphatic: Abdominal aorta is normal caliber with atherosclerotic calcification. There is no retroperitoneal or periportal lymphadenopathy. No pelvic lymphadenopathy. Reproductive: Post hysterectomy Other: No peritoneal metastasis Musculoskeletal: No aggressive osseous lesion. IMPRESSION: Chest Impression: 1. Interval increase in size of mass surrounding the RIGHT lower lobe bronchus and extending into the subcarinal nodal station consistent with progression of lung cancer. 2. Postobstructive collapse within the RIGHT lower lobe. 3. Small RIGHT effusion. Abdomen / Pelvis Impression: 1. No evidence of metastatic disease in the abdomen pelvis. 2. Diffuse thickening the gastric fundus and body along the greater curvature. Recommend correlation with gastritis. 3. Large stool ball increased in size from prior consistent with fecal impaction. These results will be called to the ordering clinician or representative by the Radiologist Assistant, and communication documented in the  PACS or zVision Dashboard. Electronically Signed   By: Suzy Bouchard M.D.   On: 12/14/2018 17:23      IMPRESSION/PLAN: Metastatic poorly differentiated carcinoma highly suspicious for primary lung cancer   Today, I talked to the patient and her daughter about the findings and work-up thus far. We discussed the available radiation techniques, and focused on the details of logistics and delivery.I recommend 2 weeks of daily radiation to her thoracic disease. We discussed the risks, benefits, and side effects of radiotherapy. No guarantees of treatment were given. A consent form was signed and placed in the patient's medical record. The patient and her daughter were encouraged to ask questions that I answered to the best of my ability.She is scheduled for CT simulation on Friday, 12/31/2018 at 3 pm.  Pain in mid chest with swallowing - "heartburn." Rxing tablet and liquid form sucralfate. Note to pharmacist to  "Please discuss costs of each with patient before determining which she can best afford - she prefers liquid form if affordable. Thank you."  Constipation- take Miralax TID until movement, may decrease prn.  I spent 40 minutes  face to face with the patient and more than 50% of that time was spent in counseling and/or coordination of care.    __________________________________________   Eppie Gibson, MD  This document serves as a record of services personally performed by Eppie Gibson, MD. It was created on her behalf by Wilburn Mylar, a trained medical scribe. The creation of this record is based on the scribe's personal observations and the provider's statements to them. This document has been checked and approved by the attending provider.

## 2018-12-28 NOTE — Telephone Encounter (Signed)
Called patient to inform of script being sent to [facility] ([address] and a note attached to pharmacist to discuss cost tablet verus liquid and please give what she can afford, lvm for a return call

## 2018-12-30 ENCOUNTER — Encounter: Payer: Self-pay | Admitting: General Practice

## 2018-12-30 NOTE — Progress Notes (Signed)
[place] Psychosocial Distress Screening Clinical Social Work  Clinical Social Work was referred by distress screening protocol.  The patient scored a 8 on the Psychosocial Distress Thermometer which indicates moderate distress. Clinical Social Worker contacted patient by phone to assess for distress and other psychosocial needs. CSW and patient discussed common feeling and emotions when being diagnosed with cancer, and the importance of support during treatment. CSW informed patient of the support team and support services at [facility]. CSW provided contact information and encouraged patient to call with any questions or concerns.  Family checks on her daily, daughter helps w transportation.    ONCBCN DISTRESS SCREENING 12/28/2018  Screening Type Change in Status  Distress experienced in past week (1-10) 8  Emotional problem type   Information Concerns Type   Physical Problem type Pain;Nausea/vomiting;Breathing;Loss of appetitie;Constipation/diarrhea;Swollen arms/legs    Clinical Social Worker follow up needed: No.  If yes, follow up plan:  Beverely Pace, [place], LCSW Clinical Social Worker Phone:  [phone]

## 2018-12-31 ENCOUNTER — Ambulatory Visit
Admission: RE | Admit: 2018-12-31 | Discharge: 2018-12-31 | Disposition: A | Discharge status: Home / Self Care | Payer: Medicare Other | Source: Ambulatory Visit | Attending: Radiation Oncology | Admitting: Radiation Oncology

## 2018-12-31 VITALS — BP 114/69 | HR 78 | Temp 98.2°F | Wt 114.4 lb

## 2018-12-31 DIAGNOSIS — C3491 Malignant neoplasm of unspecified part of right bronchus or lung: Secondary | ICD-10-CM | POA: Diagnosis not present

## 2018-12-31 DIAGNOSIS — C770 Secondary and unspecified malignant neoplasm of lymph nodes of head, face and neck: Secondary | ICD-10-CM | POA: Diagnosis not present

## 2018-12-31 DIAGNOSIS — Z510 Encounter for antineoplastic radiation therapy: Secondary | ICD-10-CM | POA: Diagnosis present

## 2018-12-31 MED ORDER — HEPARIN SOD (PORK) LOCK FLUSH 100 UNIT/ML IV SOLN
500.0000 [IU] | Freq: Once | INTRAVENOUS | Status: AC
  Administered 2018-12-31: 500 [IU] via INTRAVENOUS

## 2018-12-31 MED ORDER — SODIUM CHLORIDE 0.9% FLUSH: 10.0000 mL | Freq: Once | INTRAVENOUS | 0 refills | Status: AC

## 2018-12-31 MED ORDER — SODIUM CHLORIDE 0.9% FLUSH
10.0000 mL | Freq: Once | INTRAVENOUS | Status: AC
  Administered 2018-12-31: 10 mL via INTRAVENOUS

## 2018-12-31 NOTE — Progress Notes (Signed)
Has armband been applied?  Yes  Does patient have an allergy to IV contrast dye?: No   Has patient ever received premedication for IV contrast dye?: N/A  Does patient take metformin?: No  If patient does take metformin when was the last dose: N/A  Date of lab work: 12/16/18 BUN: 9 CR: 0.60 EGFR: >60  IV site: Left Chest PAC  Has IV site been added to flowsheet?  Yes  BP 114/69   Pulse 78   Temp 98.2 F (36.8 C) (Oral)   Wt 114 lb 6.4 oz (51.9 kg)   SpO2 99% Comment: room air  BMI 21.62 kg/m

## 2018-12-31 NOTE — Progress Notes (Signed)
  Radiation Oncology         [phone] ________________________________  Name: Faith [name] MRN: [id number]  Date: 12/31/2018  DOB: 19-May-1951  SIMULATION AND TREATMENT PLANNING NOTE  Outpatient  DIAGNOSIS:     ICD-10-CM   1. 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 Non-small cell carcinoma of right lung, stage 4 (HCC) C34.91     NARRATIVE:  The patient was brought to the [facility].  Identity was confirmed.  All relevant records and images related to the planned course of therapy were reviewed.  The patient freely provided informed written consent to proceed with treatment after reviewing the details related to the planned course of therapy. The consent form was witnessed and verified by the simulation staff.    Then, the patient was set-up in a stable reproducible  supine position for radiation therapy.  CT images were obtained with contrast.  Surface markings were placed.  The CT images were loaded into the planning software.    TREATMENT PLANNING NOTE: Treatment planning then occurred.  The radiation prescription was entered and confirmed.    A total of 3 medically necessary complex treatment devices were fabricated and supervised by me, in the form of 3 fields with MLCs to block heart and lungs. MORE FIELDS WITH MLCs MAY BE ADDED IN DOSIMETRY for dose homogeneity.  I have requested : 3D Simulation  I have requested a DVH of the following structures: target, heart, lungs.    The patient will receive 30 Gy in 10 fractions to the chest.   -----------------------------------  Eppie Gibson, MD

## 2019-01-03 ENCOUNTER — Other Ambulatory Visit (INDEPENDENT_AMBULATORY_CARE_PROVIDER_SITE_OTHER): Payer: Self-pay | Admitting: *Deleted

## 2019-01-03 ENCOUNTER — Ambulatory Visit (INDEPENDENT_AMBULATORY_CARE_PROVIDER_SITE_OTHER): Payer: Medicare Other

## 2019-01-03 ENCOUNTER — Ambulatory Visit (INDEPENDENT_AMBULATORY_CARE_PROVIDER_SITE_OTHER): Payer: Medicare Other | Admitting: Orthopaedic Surgery

## 2019-01-03 ENCOUNTER — Encounter (INDEPENDENT_AMBULATORY_CARE_PROVIDER_SITE_OTHER): Payer: Self-pay | Admitting: Orthopaedic Surgery

## 2019-01-03 VITALS — BP 115/54 | HR 78 | Resp 18 | Ht 61.0 in | Wt 114.0 lb

## 2019-01-03 DIAGNOSIS — M79641 Pain in right hand: Secondary | ICD-10-CM

## 2019-01-03 NOTE — Progress Notes (Signed)
FMLA for daughter, Cerena Baine, successfully faxed to [employer] at [phone]. Mailed copy to patient address on file.

## 2019-01-03 NOTE — Progress Notes (Signed)
Office Visit Note   Patient: Faith [name]           Date of Birth: [birth year]           MRN: [id number] Visit Date: 01/03/2019              Requested by: [name], MD [address] [place], [place] [address] PCP: [name], MD   Assessment & Plan: Visit Diagnoses:  1. Pain in right hand     Plan: Right hand pain seems to be related to the flexion contractures of the little and ring finger.  There is clawing and some decreased sensibility but good capillary refill.  This could be an issue with the ulnar nerve or possibly related to her stroke and or lymph node biopsy from the right side of her neck involving the C7 or C8 nerve.  Will obtain EMGs and nerve conduction studies.  Clinically there is evidence of either an ulnar nerve problem or C7-C8 nerve causing the decreased sensibility and clawing of the digits  Follow-Up Instructions: Return after EMG's.   Orders:  Orders Placed This Encounter  Procedures  . XR Hand Complete Right   No orders of the defined types were placed in this encounter.     Procedures: No procedures performed   Clinical Data: No additional findings.   Subjective: Chief Complaint  Patient presents with  . Right Hand - Pain  . Hand Pain    Right hand pain    HPI   [name] is a 68 year old female who presents with right hand pain x 1 year, worsening since stroke x 2 years. Mass on right neck, biopsy, worsened since then. Swelling worse at night, limited range of motion, cannot move 4th and 5th digits, weakness, tingling, no prior surgery, not diabetic. Radiation starts Thursday, January 06, 2019. Had PT in the past. Difficulty sleeping at night due to hand pain. [name] is accompanied by her daughter and here for evaluation of chronic right hand pain.  Her daughter relates that she had a stroke affecting the right side and also was had a biopsy of a lymph node on the right side of her neck that was positive for cancer.  She  is about to start "treatment".  Either related to the stroke and or the lymph node biopsy [name] has had progressive clawing of the right ring and little finger with some related decreased sensibility.  She has not had any injury or trauma.  She is been through a course of physical therapy [name] they "cannot help her anymore".  She continues to have pain and inability to fully extend the little and ring finger.  Review of Systems  Constitutional: Positive for fatigue.  HENT: Positive for trouble swallowing.   Eyes: Positive for pain and visual disturbance.  Respiratory: Positive for shortness of breath.   Cardiovascular: Positive for leg swelling.  Gastrointestinal: Positive for constipation and diarrhea.  Endocrine: Positive for cold intolerance and heat intolerance.  Genitourinary: Negative for difficulty urinating.  Musculoskeletal: Positive for joint swelling.  Skin: Negative for rash.  Allergic/Immunologic: Negative for food allergies.  Neurological: Positive for weakness.  Hematological: Does not bruise/bleed easily.  Psychiatric/Behavioral: Positive for sleep disturbance.     Objective: Vital Signs: BP (!) 115/54 (BP Location: Left Arm, Patient Position: Sitting, Cuff Size: Normal)   Pulse 78   Resp 18   Ht 5\' 1"  (1.549 m)   Wt 114 lb (51.7 kg)  BMI 21.54 kg/m   Physical Exam Constitutional:      Appearance: She is well-developed.  Eyes:     Pupils: Pupils are equal, round, and reactive to light.  Pulmonary:     Effort: Pulmonary effort is normal.  Skin:    General: Skin is warm and dry.  Neurological:     Mental Status: She is alert and oriented to person, place, and time.  Psychiatric:        Behavior: Behavior normal.     Ortho Exam evaluated sitting.  Awake alert and oriented x3.  Has chemotherapy portal placed in the left chest wall.  Has had a prior lymph node biopsy on the right side of her neck with scarring.  She had significant clawing of the  right little and ring finger.  I could extend her fingers away from her hand for about 2 fingerbreadths at which point she was quite uncomfortable with stretching across particularly the MP and points.  There is some slight decreased sensibility in those 2 digits.  No pain about the ulnar nerve at the elbow.  Considerable atrophy of the intrinsic muscles right hand.  Able to flex and extend the other digits.  No pain over the median nerve.  Normal sensibility of the thumb index and long finger.  No pain referred to the right hand with motion of her neck. Specialty Comments:  No specialty comments available.  Imaging: Xr Hand Complete Right  Result Date: 01/03/2019 Films of the right hand were obtained in several projections.  No acute changes or evidence of fracture.  No ectopic calcification.  Some degenerative changes to the base of the thumb.  Some mild degenerative changes about the PIP joint of the little and ring finger.    PMFS History: Patient Active Problem List   Diagnosis Date Noted  . Dysphagia 12/16/2018  . Fecal impaction in rectum ([facility]) 10/20/2018  . Fecal impaction of rectum ([facility]) 10/20/2018  . Chronic pain 10/20/2018  . Port-A-Cath in place 04/28/2018  . Encounter for antineoplastic chemotherapy 04/21/2018  . Encounter for antineoplastic immunotherapy 04/21/2018  . Non-small cell carcinoma of right lung, stage 4 ([facility]) 02/23/2018  . Secondary and unspecified malignant neoplasm of lymph nodes of head, face and neck ([place]) 01/05/2018  . Hyperkalemia 12/23/2017  . HLD (hyperlipidemia) 12/22/2017  . GERD (gastroesophageal reflux disease) 12/22/2017  . History of stroke 12/22/2017  . Tobacco abuse 12/22/2017  . Mass in neck 12/22/2017  . Hypokalemia 12/22/2017  . Hypercalcemia 12/22/2017  . Nausea vomiting and diarrhea 12/22/2017  . AKI (acute kidney injury) ([facility]) 12/22/2017  . Stroke-like symptom 02/14/2017  . Low potassium syndrome 02/14/2017  . PAD (peripheral artery  disease) ([facility]) 01/15/2017  . Nonhealing nonsurgical wound 12/05/2016  . Essential hypertension 07/06/2014  . Depression 07/06/2014  . Back pain 07/06/2014  . Chronic cholecystitis 07/05/2014  . Right sided abdominal pain 05/11/2014  . Cholelithiasis 05/11/2014   Past Medical History:  Diagnosis Date  . Anxiety   . Arthritis    "thighs; legs; hips" (07/05/2014)  . Bipolar disorder ([place])   . Cancer ([facility])   . Cholelithiasis 07/2014  . Chronic bronchitis ([place])    "get it q yr"  . Chronic lower back pain   . Depression    pt. use to go to depression clinic, states she still has depression & anxiety but can't get to appt. so she hasn't had any med. for it in a while   . Dyspnea   . GERD (  gastroesophageal reflux disease)   . High cholesterol   . History of radiation therapy 02/01/18- 02/12/18   Right Neck/ 30 Gy in 10 fractions.   . Hypertension   . Mass in neck 12/2017   RIGHT SIDE OF NECK  . Peripheral vascular disease ([place])   . Schizophrenia ([place])   . Stroke [facility])    caused left eye blindness    Family History  Problem Relation Age of Onset  . Cancer Mother 58       colon  . Diabetes Mother   . Hypertension Mother   . Cancer Father   . Diabetes Father   . Hypertension Father     Past Surgical History:  Procedure Laterality Date  . ABDOMINAL AORTOGRAM N/A 01/07/2017   Procedure: Abdominal Aortogram;  Surgeon: Waynetta Sandy, MD;  Location: [facility];  Service: Cardiovascular;  Laterality: N/A;  . ABDOMINAL HYSTERECTOMY    . ANKLE FRACTURE SURGERY Right   . ANKLE HARDWARE REMOVAL Right   . APPENDECTOMY  ~ 1963  . BREAST BIOPSY Bilateral   . BREAST CYST EXCISION Bilateral    "not cancer"  . CATARACT EXTRACTION W/ INTRAOCULAR LENS  IMPLANT, BILATERAL    . CHOLECYSTECTOMY N/A 07/05/2014   Procedure: LAPAROSCOPIC CHOLECYSTECTOMY WITH INTRAOPERATIVE CHOLANGIOGRAM;  Surgeon: Gayland Curry, MD;  Location: [facility];  Service: General;  Laterality: N/A;  . EXCISIONAL  HEMORRHOIDECTOMY    . FEMORAL-POPLITEAL BYPASS GRAFT Right 01/15/2017   Procedure: BYPASS GRAFT FEMORAL-POPLITEAL ARTERY RIGHT LEG;  Surgeon: Waynetta Sandy, MD;  Location: [facility];  Service: Vascular;  Laterality: Right;  . IR IMAGING GUIDED PORT INSERTION  03/08/2018  . IR US GUIDE VASC ACCESS LEFT  03/08/2018  . LAPAROSCOPIC CHOLECYSTECTOMY  07/05/2014  . LOWER EXTREMITY ANGIOGRAPHY Bilateral 01/07/2017   Procedure: Lower Extremity Angiography;  Surgeon: Waynetta Sandy, MD;  Location: [facility];  Service: Cardiovascular;  Laterality: Bilateral;  . MASS BIOPSY Right 12/25/2017   Procedure: INCISIONAL RIGHT NECK MASS BIOPSY;  Surgeon: Helayne Seminole, MD;  Location: [facility];  Service: ENT;  Laterality: Right;  . MULTIPLE TOOTH EXTRACTIONS  05/2013   "pulled my upper teeth"  . PILONIDAL CYST EXCISION     Social History   Occupational History  . Not on file  Tobacco Use  . Smoking status: Former Smoker    Packs/day: 1.00    Years: 48.00    Pack years: 48.00    Types: Cigarettes    Last attempt to quit: 01/28/2018    Years since quitting: 0.9  . Smokeless tobacco: Never Used  . Tobacco comment: Occasional.   Substance and Sexual Activity  . Alcohol use: No    Frequency: Never  . Drug use: No    Comment: 15-20 yrs. ago- used marijuana   . Sexual activity: Yes    Birth control/protection: Post-menopausal

## 2019-01-05 DIAGNOSIS — C770 Secondary and unspecified malignant neoplasm of lymph nodes of head, face and neck: Secondary | ICD-10-CM | POA: Insufficient documentation

## 2019-01-05 DIAGNOSIS — Z510 Encounter for antineoplastic radiation therapy: Secondary | ICD-10-CM | POA: Diagnosis not present

## 2019-01-05 DIAGNOSIS — C3491 Malignant neoplasm of unspecified part of right bronchus or lung: Secondary | ICD-10-CM | POA: Diagnosis not present

## 2019-01-06 ENCOUNTER — Other Ambulatory Visit: Payer: Medicare Other

## 2019-01-06 ENCOUNTER — Ambulatory Visit: Payer: Medicare Other | Admitting: Oncology

## 2019-01-06 ENCOUNTER — Ambulatory Visit: Payer: Medicare Other

## 2019-01-06 ENCOUNTER — Ambulatory Visit: Payer: Medicare Other | Admitting: Radiation Oncology

## 2019-01-07 ENCOUNTER — Ambulatory Visit: Payer: Medicare Other

## 2019-01-10 ENCOUNTER — Ambulatory Visit
Admission: RE | Admit: 2019-01-10 | Discharge: 2019-01-10 | Disposition: A | Discharge status: Home / Self Care | Payer: Medicare Other | Source: Ambulatory Visit | Attending: Radiation Oncology | Admitting: Radiation Oncology

## 2019-01-10 DIAGNOSIS — J690 Pneumonitis due to inhalation of food and vomit: Secondary | ICD-10-CM | POA: Diagnosis not present

## 2019-01-10 DIAGNOSIS — R05 Cough: Secondary | ICD-10-CM | POA: Diagnosis not present

## 2019-01-11 ENCOUNTER — Ambulatory Visit: Payer: Medicare Other

## 2019-01-12 ENCOUNTER — Inpatient Hospital Stay (HOSPITAL_COMMUNITY)
Admission: EM | Admit: 2019-01-12 | Discharge: 2019-01-30 | DRG: 177 | Disposition: E | Payer: Medicare Other | Source: Ambulatory Visit | Attending: Internal Medicine | Admitting: Internal Medicine

## 2019-01-12 ENCOUNTER — Other Ambulatory Visit: Payer: Self-pay

## 2019-01-12 ENCOUNTER — Encounter (HOSPITAL_COMMUNITY): Payer: Self-pay

## 2019-01-12 ENCOUNTER — Ambulatory Visit
Admission: RE | Admit: 2019-01-12 | Discharge: 2019-01-12 | Disposition: A | Discharge status: Home / Self Care | Payer: Medicare Other | Source: Ambulatory Visit | Attending: Radiation Oncology | Admitting: Radiation Oncology

## 2019-01-12 ENCOUNTER — Emergency Department (HOSPITAL_COMMUNITY): Payer: Medicare Other

## 2019-01-12 DIAGNOSIS — I959 Hypotension, unspecified: Secondary | ICD-10-CM | POA: Diagnosis present

## 2019-01-12 DIAGNOSIS — Z87891 Personal history of nicotine dependence: Secondary | ICD-10-CM

## 2019-01-12 DIAGNOSIS — Z809 Family history of malignant neoplasm, unspecified: Secondary | ICD-10-CM

## 2019-01-12 DIAGNOSIS — Z7902 Long term (current) use of antithrombotics/antiplatelets: Secondary | ICD-10-CM | POA: Diagnosis not present

## 2019-01-12 DIAGNOSIS — R9431 Abnormal electrocardiogram [ECG] [EKG]: Secondary | ICD-10-CM | POA: Diagnosis not present

## 2019-01-12 DIAGNOSIS — Z8249 Family history of ischemic heart disease and other diseases of the circulatory system: Secondary | ICD-10-CM | POA: Diagnosis not present

## 2019-01-12 DIAGNOSIS — E785 Hyperlipidemia, unspecified: Secondary | ICD-10-CM | POA: Diagnosis present

## 2019-01-12 DIAGNOSIS — E440 Moderate protein-calorie malnutrition: Secondary | ICD-10-CM

## 2019-01-12 DIAGNOSIS — M545 Low back pain: Secondary | ICD-10-CM | POA: Diagnosis present

## 2019-01-12 DIAGNOSIS — J189 Pneumonia, unspecified organism: Secondary | ICD-10-CM | POA: Diagnosis not present

## 2019-01-12 DIAGNOSIS — Z515 Encounter for palliative care: Secondary | ICD-10-CM | POA: Diagnosis not present

## 2019-01-12 DIAGNOSIS — I10 Essential (primary) hypertension: Secondary | ICD-10-CM | POA: Diagnosis present

## 2019-01-12 DIAGNOSIS — R109 Unspecified abdominal pain: Secondary | ICD-10-CM | POA: Diagnosis present

## 2019-01-12 DIAGNOSIS — J910 Malignant pleural effusion: Secondary | ICD-10-CM | POA: Diagnosis present

## 2019-01-12 DIAGNOSIS — C781 Secondary malignant neoplasm of mediastinum: Secondary | ICD-10-CM | POA: Diagnosis present

## 2019-01-12 DIAGNOSIS — D638 Anemia in other chronic diseases classified elsewhere: Secondary | ICD-10-CM | POA: Diagnosis present

## 2019-01-12 DIAGNOSIS — Z7951 Long term (current) use of inhaled steroids: Secondary | ICD-10-CM

## 2019-01-12 DIAGNOSIS — Z7189 Other specified counseling: Secondary | ICD-10-CM | POA: Diagnosis not present

## 2019-01-12 DIAGNOSIS — T502X5A Adverse effect of carbonic-anhydrase inhibitors, benzothiadiazides and other diuretics, initial encounter: Secondary | ICD-10-CM | POA: Diagnosis not present

## 2019-01-12 DIAGNOSIS — Z23 Encounter for immunization: Secondary | ICD-10-CM | POA: Diagnosis present

## 2019-01-12 DIAGNOSIS — Z833 Family history of diabetes mellitus: Secondary | ICD-10-CM

## 2019-01-12 DIAGNOSIS — J9621 Acute and chronic respiratory failure with hypoxia: Secondary | ICD-10-CM | POA: Diagnosis present

## 2019-01-12 DIAGNOSIS — R000 Tachycardia, unspecified: Secondary | ICD-10-CM | POA: Clinically undetermined

## 2019-01-12 DIAGNOSIS — R0902 Hypoxemia: Secondary | ICD-10-CM

## 2019-01-12 DIAGNOSIS — E7800 Pure hypercholesterolemia, unspecified: Secondary | ICD-10-CM | POA: Diagnosis present

## 2019-01-12 DIAGNOSIS — R0602 Shortness of breath: Secondary | ICD-10-CM | POA: Diagnosis not present

## 2019-01-12 DIAGNOSIS — Z66 Do not resuscitate: Secondary | ICD-10-CM | POA: Diagnosis not present

## 2019-01-12 DIAGNOSIS — F319 Bipolar disorder, unspecified: Secondary | ICD-10-CM | POA: Diagnosis present

## 2019-01-12 DIAGNOSIS — Z8673 Personal history of transient ischemic attack (TIA), and cerebral infarction without residual deficits: Secondary | ICD-10-CM | POA: Diagnosis not present

## 2019-01-12 DIAGNOSIS — J690 Pneumonitis due to inhalation of food and vomit: Principal | ICD-10-CM | POA: Diagnosis present

## 2019-01-12 DIAGNOSIS — Z6821 Body mass index (BMI) 21.0-21.9, adult: Secondary | ICD-10-CM

## 2019-01-12 DIAGNOSIS — F209 Schizophrenia, unspecified: Secondary | ICD-10-CM | POA: Diagnosis present

## 2019-01-12 DIAGNOSIS — J441 Chronic obstructive pulmonary disease with (acute) exacerbation: Secondary | ICD-10-CM | POA: Diagnosis present

## 2019-01-12 DIAGNOSIS — I739 Peripheral vascular disease, unspecified: Secondary | ICD-10-CM | POA: Diagnosis present

## 2019-01-12 DIAGNOSIS — C3491 Malignant neoplasm of unspecified part of right bronchus or lung: Secondary | ICD-10-CM | POA: Diagnosis present

## 2019-01-12 DIAGNOSIS — C770 Secondary and unspecified malignant neoplasm of lymph nodes of head, face and neck: Secondary | ICD-10-CM | POA: Diagnosis present

## 2019-01-12 DIAGNOSIS — K219 Gastro-esophageal reflux disease without esophagitis: Secondary | ICD-10-CM | POA: Diagnosis present

## 2019-01-12 DIAGNOSIS — J860 Pyothorax with fistula: Secondary | ICD-10-CM | POA: Diagnosis present

## 2019-01-12 DIAGNOSIS — E43 Unspecified severe protein-calorie malnutrition: Secondary | ICD-10-CM | POA: Diagnosis present

## 2019-01-12 DIAGNOSIS — Z800 Family history of malignant neoplasm of digestive organs: Secondary | ICD-10-CM

## 2019-01-12 DIAGNOSIS — G8929 Other chronic pain: Secondary | ICD-10-CM | POA: Diagnosis present

## 2019-01-12 DIAGNOSIS — E876 Hypokalemia: Secondary | ICD-10-CM | POA: Diagnosis not present

## 2019-01-12 DIAGNOSIS — Z885 Allergy status to narcotic agent status: Secondary | ICD-10-CM

## 2019-01-12 DIAGNOSIS — Z880 Allergy status to penicillin: Secondary | ICD-10-CM

## 2019-01-12 DIAGNOSIS — R05 Cough: Secondary | ICD-10-CM | POA: Diagnosis present

## 2019-01-12 DIAGNOSIS — K5903 Drug induced constipation: Secondary | ICD-10-CM | POA: Diagnosis present

## 2019-01-12 DIAGNOSIS — K5900 Constipation, unspecified: Secondary | ICD-10-CM | POA: Diagnosis not present

## 2019-01-12 DIAGNOSIS — T40605A Adverse effect of unspecified narcotics, initial encounter: Secondary | ICD-10-CM | POA: Diagnosis present

## 2019-01-12 LAB — CBC
HCT: 28.1 % — ABNORMAL LOW (ref 36.0–46.0)
Hemoglobin: 9.7 g/dL — ABNORMAL LOW (ref 12.0–15.0)
MCH: 29 pg (ref 26.0–34.0)
MCHC: 34.5 g/dL (ref 30.0–36.0)
MCV: 84.1 fL (ref 80.0–100.0)
Platelets: 206 10*3/uL (ref 150–400)
RBC: 3.34 MIL/uL — ABNORMAL LOW (ref 3.87–5.11)
RDW: 14 % (ref 11.5–15.5)
WBC: 11 10*3/uL — ABNORMAL HIGH (ref 4.0–10.5)
nRBC: 0 % (ref 0.0–0.2)

## 2019-01-12 LAB — BASIC METABOLIC PANEL
Anion gap: 8 (ref 5–15)
BUN: 30 mg/dL — ABNORMAL HIGH (ref 8–23)
CO2: 22 mmol/L (ref 22–32)
CREATININE: 0.75 mg/dL (ref 0.44–1.00)
Calcium: 10.8 mg/dL — ABNORMAL HIGH (ref 8.9–10.3)
Chloride: 102 mmol/L (ref 98–111)
GFR calc Af Amer: >60 mL/min (ref >60)
GFR calc non Af Amer: >60 mL/min (ref >60)
Glucose, Bld: 94 mg/dL (ref 70–99)
Potassium: 4 mmol/L (ref 3.5–5.1)
Sodium: 132 mmol/L — ABNORMAL LOW (ref 135–145)

## 2019-01-12 LAB — INFLUENZA PANEL BY PCR (TYPE A & B)
Influenza A By PCR: NEGATIVE
Influenza B By PCR: NEGATIVE

## 2019-01-12 LAB — TROPONIN I: Troponin I: <0.03 ng/mL (ref <0.03)

## 2019-01-12 LAB — HEPATIC FUNCTION PANEL
ALK PHOS: 95 U/L (ref 38–126)
ALT: 23 U/L (ref 0–44)
AST: 53 U/L — ABNORMAL HIGH (ref 15–41)
Albumin: 2.8 g/dL — ABNORMAL LOW (ref 3.5–5.0)
Bilirubin, Direct: 2 mg/dL — ABNORMAL HIGH (ref 0.0–0.2)
Indirect Bilirubin: 1.8 mg/dL — ABNORMAL HIGH (ref 0.3–0.9)
Total Bilirubin: 3.8 mg/dL — ABNORMAL HIGH (ref 0.3–1.2)
Total Protein: 6.8 g/dL (ref 6.5–8.1)

## 2019-01-12 LAB — I-STAT TROPONIN, ED: Troponin i, poc: 0.02 ng/mL (ref 0.00–0.08)

## 2019-01-12 LAB — LIPASE, BLOOD: Lipase: 19 U/L (ref 11–51)

## 2019-01-12 LAB — LACTIC ACID, PLASMA: Lactic Acid, Venous: 1.3 mmol/L (ref 0.5–1.9)

## 2019-01-12 LAB — CBG MONITORING, ED: Glucose-Capillary: 90 mg/dL (ref 70–99)

## 2019-01-12 MED ORDER — LINACLOTIDE 145 MCG PO CAPS
290.0000 ug | ORAL_CAPSULE | Freq: Every day | ORAL | Status: DC | PRN
  Filled 2019-01-12: qty 2

## 2019-01-12 MED ORDER — FLUTICASONE PROPIONATE 50 MCG/ACT NA SUSP
1.0000 | Freq: Every day | NASAL | Status: DC
  Administered 2019-01-13 – 2019-01-20 (×8): 1 via NASAL
  Filled 2019-01-12: qty 16

## 2019-01-12 MED ORDER — VANCOMYCIN HCL IN DEXTROSE 1-5 GM/200ML-% IV SOLN
1000.0000 mg | Freq: Once | INTRAVENOUS | Status: AC
  Administered 2019-01-12: 1000 mg via INTRAVENOUS
  Filled 2019-01-12: qty 200

## 2019-01-12 MED ORDER — IBUPROFEN 800 MG PO TABS: 800.0000 mg | ORAL_TABLET | Freq: Three times a day (TID) | ORAL | Status: DC | PRN

## 2019-01-12 MED ORDER — SODIUM CHLORIDE 0.9% FLUSH
3.0000 mL | Freq: Once | INTRAVENOUS | Status: AC
  Administered 2019-01-12: 3 mL via INTRAVENOUS

## 2019-01-12 MED ORDER — ENSURE ENLIVE PO LIQD
237.0000 mL | Freq: Two times a day (BID) | ORAL | Status: DC
  Administered 2019-01-13 – 2019-01-20 (×13): 237 mL via ORAL

## 2019-01-12 MED ORDER — VANCOMYCIN HCL IN DEXTROSE 750-5 MG/150ML-% IV SOLN
750.0000 mg | INTRAVENOUS | Status: DC
  Administered 2019-01-13: 750 mg via INTRAVENOUS
  Filled 2019-01-12: qty 150

## 2019-01-12 MED ORDER — TIZANIDINE HCL 4 MG PO TABS
4.0000 mg | ORAL_TABLET | Freq: Two times a day (BID) | ORAL | Status: DC
  Administered 2019-01-12 – 2019-01-20 (×17): 4 mg via ORAL
  Filled 2019-01-12 (×17): qty 1

## 2019-01-12 MED ORDER — SODIUM CHLORIDE 0.9 % IV BOLUS
1000.0000 mL | Freq: Once | INTRAVENOUS | Status: AC
  Administered 2019-01-12: 1000 mL via INTRAVENOUS

## 2019-01-12 MED ORDER — POTASSIUM CHLORIDE 20 MEQ/15ML (10%) PO SOLN
40.0000 meq | Freq: Every day | ORAL | Status: DC
  Administered 2019-01-13 – 2019-01-20 (×8): 40 meq via ORAL
  Filled 2019-01-12 (×10): qty 30

## 2019-01-12 MED ORDER — SODIUM CHLORIDE 0.9 % IV SOLN
2.0000 g | Freq: Once | INTRAVENOUS | Status: AC
  Administered 2019-01-12: 2 g via INTRAVENOUS
  Filled 2019-01-12: qty 2

## 2019-01-12 MED ORDER — ALBUTEROL SULFATE HFA 108 (90 BASE) MCG/ACT IN AERS: 1.0000 | INHALATION_SPRAY | Freq: Four times a day (QID) | RESPIRATORY_TRACT | Status: DC | PRN

## 2019-01-12 MED ORDER — SODIUM CHLORIDE 0.9 % IV SOLN
1.0000 g | Freq: Three times a day (TID) | INTRAVENOUS | Status: AC
  Administered 2019-01-13 – 2019-01-20 (×24): 1 g via INTRAVENOUS
  Filled 2019-01-12 (×26): qty 1

## 2019-01-12 MED ORDER — SODIUM CHLORIDE 0.9 % IV SOLN
250.0000 mL | INTRAVENOUS | Status: DC | PRN
  Administered 2019-01-13 – 2019-01-18 (×4): 250 mL via INTRAVENOUS

## 2019-01-12 MED ORDER — MOMETASONE FUROATE 0.1 % EX OINT: 1.0000 "application " | TOPICAL_OINTMENT | Freq: Every day | CUTANEOUS | Status: DC | PRN

## 2019-01-12 MED ORDER — SODIUM CHLORIDE 0.9% FLUSH: 3.0000 mL | INTRAVENOUS | Status: DC | PRN

## 2019-01-12 MED ORDER — ALBUTEROL SULFATE (2.5 MG/3ML) 0.083% IN NEBU
2.5000 mg | INHALATION_SOLUTION | Freq: Four times a day (QID) | RESPIRATORY_TRACT | Status: DC | PRN
  Administered 2019-01-15: 2.5 mg via RESPIRATORY_TRACT
  Filled 2019-01-12 (×2): qty 3

## 2019-01-12 MED ORDER — DIPHENHYDRAMINE HCL 25 MG PO CAPS
50.0000 mg | ORAL_CAPSULE | Freq: Every evening | ORAL | Status: DC | PRN
  Administered 2019-01-20: 50 mg via ORAL
  Filled 2019-01-12: qty 2

## 2019-01-12 MED ORDER — GABAPENTIN 300 MG PO CAPS
300.0000 mg | ORAL_CAPSULE | Freq: Three times a day (TID) | ORAL | Status: DC
  Administered 2019-01-12 – 2019-01-20 (×23): 300 mg via ORAL
  Filled 2019-01-12 (×24): qty 1

## 2019-01-12 MED ORDER — LUBIPROSTONE 24 MCG PO CAPS
24.0000 ug | ORAL_CAPSULE | Freq: Two times a day (BID) | ORAL | Status: DC
  Administered 2019-01-13 – 2019-01-21 (×15): 24 ug via ORAL
  Filled 2019-01-12 (×18): qty 1

## 2019-01-12 MED ORDER — OXYCODONE HCL 5 MG PO TABS
20.0000 mg | ORAL_TABLET | Freq: Four times a day (QID) | ORAL | Status: DC | PRN
  Administered 2019-01-12 – 2019-01-14 (×3): 20 mg via ORAL
  Filled 2019-01-12 (×3): qty 4

## 2019-01-12 MED ORDER — INFLUENZA VAC SPLIT HIGH-DOSE 0.5 ML IM SUSY
0.5000 mL | PREFILLED_SYRINGE | INTRAMUSCULAR | Status: AC
  Administered 2019-01-13: 0.5 mL via INTRAMUSCULAR
  Filled 2019-01-12: qty 0.5

## 2019-01-12 MED ORDER — IOPAMIDOL (ISOVUE-370) INJECTION 76%
100.0000 mL | Freq: Once | INTRAVENOUS | Status: AC | PRN
  Administered 2019-01-12: 80 mL via INTRAVENOUS

## 2019-01-12 MED ORDER — LORATADINE 10 MG PO TABS
10.0000 mg | ORAL_TABLET | Freq: Every day | ORAL | Status: DC
  Administered 2019-01-13 – 2019-01-20 (×8): 10 mg via ORAL
  Filled 2019-01-12 (×8): qty 1

## 2019-01-12 MED ORDER — CLOPIDOGREL BISULFATE 75 MG PO TABS
75.0000 mg | ORAL_TABLET | Freq: Every day | ORAL | Status: DC
  Administered 2019-01-13 – 2019-01-20 (×8): 75 mg via ORAL
  Filled 2019-01-12 (×8): qty 1

## 2019-01-12 MED ORDER — SODIUM CHLORIDE 0.9% FLUSH
3.0000 mL | Freq: Two times a day (BID) | INTRAVENOUS | Status: DC
  Administered 2019-01-12 – 2019-01-21 (×12): 3 mL via INTRAVENOUS

## 2019-01-12 NOTE — ED Notes (Signed)
Patient called out for food and drink. Darl Householder, MD made aware. Do not give PO until Darl Householder, MD has reviewed CT scan.

## 2019-01-12 NOTE — ED Notes (Signed)
Bed: WA17 Expected date:  Expected time:  Means of arrival:  Comments: RES A

## 2019-01-12 NOTE — ED Notes (Signed)
Patient given soup, crackers and PO fluids per MD.

## 2019-01-12 NOTE — ED Notes (Signed)
Patient encouraged to void. Pure wick checked and secured.

## 2019-01-12 NOTE — ED Notes (Signed)
ED TO INPATIENT HANDOFF REPORT  Name/Age/Gender Faith [name] 68 y.o. female  Code Status    Code Status Orders  (From admission, onward)         Start     Ordered   01/17/2019 1941  Full code  Continuous     01/05/2019 1941        Code Status History    Date Active Date Inactive Code Status Order ID Comments User Context   10/20/2018 2346 10/25/2018 1410 Full Code [id number]  Vianne Bulls, MD ED   12/22/2017 2351 12/25/2017 1958 Full Code [id number]  Ivor Costa, MD ED   02/14/2017 0741 02/18/2017 2028 Full Code [id number]  Elwin Mocha, MD ED   01/15/2017 1614 01/19/2017 1738 Full Code [id number]  Waynetta Sandy, MD Inpatient   01/07/2017 0949 01/07/2017 1710 Full Code [id number]  Waynetta Sandy, MD Inpatient   07/05/2014 1118 07/06/2014 1444 Full Code [id number]  Greer Pickerel, MD Inpatient      Home/SNF/Other Home  Chief Complaint weakness;altered mental status  Level of Care/Admitting Diagnosis ED Disposition    ED Disposition Condition [place]: [facility] [[id number]]  Level of Care: Med-Surg [16]  Diagnosis: PNA (pneumonia) [[id number]]  Admitting Physician: Phillips Grout [[id number]]  Attending Physician: Derrill Kay A [[id number]]  Estimated length of stay: past midnight tomorrow  Certification:: I certify this patient will need inpatient services for at least 2 midnights  PT Class (Do Not Modify): Inpatient [101]  PT Acc Code (Do Not Modify): Private [1]       Medical History Past Medical History:  Diagnosis Date  . Anxiety   . Arthritis    "thighs; legs; hips" (07/05/2014)  . Bipolar disorder ([place])   . Cancer ([place])   . Cholelithiasis 07/2014  . Chronic bronchitis ([place])    "get it q yr"  . Chronic lower back pain   . Depression    pt. use to go to depression clinic, states she still has depression & anxiety but can't get to appt. so she hasn't had any med. for it in a while   . Dyspnea   . GERD  (gastroesophageal reflux disease)   . High cholesterol   . History of radiation therapy 02/01/18- 02/12/18   Right Neck/ 30 Gy in 10 fractions.   . Hypertension   . Mass in neck 12/2017   RIGHT SIDE OF NECK  . Peripheral vascular disease ([place])   . Schizophrenia ([place])   . Stroke [facility])    caused left eye blindness    Allergies Allergies  Allergen Reactions  . Penicillins Itching and Other (See Comments)    Causes yeast infections  PATIENT HAS HAD A PCN REACTION WITH IMMEDIATE RASH, FACIAL/TONGUE/THROAT SWELLING, SOB, OR LIGHTHEADEDNESS WITH HYPOTENSION:  #  #  #  YES  #  #  #   Has patient had a PCN reaction causing severe rash involving mucus membranes or skin necrosis: Unk Has patient had a PCN reaction that required hospitalization: No Has patient had a PCN reaction occurring within the last 10 years: #  #  #  YES  #  #  #  If all of the above answers are "NO", then may proceed with Cephalosporin   . Codeine Itching and Other (See Comments)    And yeast infections  . Percocet [Oxycodone-Acetaminophen] Itching    Tolerates with Benadryl    IV Location/Drains/Wounds Patient Lines/Drains/Airways Status   Active  Line/Drains/Airways    Name:   Placement date:   Placement time:   Site:   Days:   Implanted Port 03/08/18 Right Chest   03/08/18    1543    Chest   310   Implanted Port 01/25/2019 Left Chest   01/20/2019    1444    Chest   less than 1   Peripheral IV 01/05/2019 Right Forearm   01/19/2019    1439    Forearm   less than 1   External Urinary Catheter   01/09/2019    1642    -   less than 1   Incision (Closed) 10/23/18 Perineum Other (Comment)   10/23/18    1458     81          Labs/Imaging Results for orders placed or performed during the hospital encounter of 01/26/2019 (from the past 48 hour(s))  Basic metabolic panel     Status: Abnormal   Collection Time: 01/27/2019  2:41 PM  Result Value Ref Range   Sodium 132 (L) 135 - 145 mmol/L   Potassium 4.0 3.5 - 5.1 mmol/L   Chloride  102 98 - 111 mmol/L   CO2 22 22 - 32 mmol/L   Glucose, Bld 94 70 - 99 mg/dL   BUN 30 (H) 8 - 23 mg/dL   Creatinine, Ser 0.75 0.44 - 1.00 mg/dL   Calcium 10.8 (H) 8.9 - 10.3 mg/dL   GFR calc non Af Amer >60 >60 mL/min   GFR calc Af Amer >60 >60 mL/min   Anion gap 8 5 - 15    Comment: Performed at [facility], [place] [address], [place], [place] [address]  CBC     Status: Abnormal   Collection Time: 01/17/2019  2:41 PM  Result Value Ref Range   WBC 11.0 (H) 4.0 - 10.5 K/uL   RBC 3.34 (L) 3.87 - 5.11 MIL/uL   Hemoglobin 9.7 (L) 12.0 - 15.0 g/dL   HCT 28.1 (L) 36.0 - 46.0 %   MCV 84.1 80.0 - 100.0 fL   MCH 29.0 26.0 - 34.0 pg   MCHC 34.5 30.0 - 36.0 g/dL   RDW 14.0 11.5 - 15.5 %   Platelets 206 150 - 400 K/uL   nRBC 0.0 0.0 - 0.2 %    Comment: Performed at [facility], [place] [address], [place], [place] [address]  Hepatic function panel     Status: Abnormal   Collection Time: 01/16/2019  2:41 PM  Result Value Ref Range   Total Protein 6.8 6.5 - 8.1 g/dL   Albumin 2.8 (L) 3.5 - 5.0 g/dL   AST 53 (H) 15 - 41 U/L   ALT 23 0 - 44 U/L   Alkaline Phosphatase 95 38 - 126 U/L   Total Bilirubin 3.8 (H) 0.3 - 1.2 mg/dL   Bilirubin, Direct 2.0 (H) 0.0 - 0.2 mg/dL   Indirect Bilirubin 1.8 (H) 0.3 - 0.9 mg/dL    Comment: Performed at [facility], [place] [address] [place], [place] [address]  Lipase, blood     Status: None   Collection Time: 01/16/2019  2:41 PM  Result Value Ref Range   Lipase 19 11 - 51 U/L    Comment: Performed at [facility], [place] [address], [place], [place] [address]  Troponin I - Add-On to previous collection     Status: None   Collection Time: 01/23/2019  2:41 PM  Result Value Ref  Range   Troponin I <0.03 <0.03 ng/mL    Comment: Performed at [facility], [place] [address] [place], [place] [address]  Lactic acid, plasma     Status: None   Collection Time: 01/01/2019  2:42 PM  Result  Value Ref Range   Lactic Acid, Venous 1.3 0.5 - 1.9 mmol/L    Comment: Performed at [facility], [place] [address], [place], [place] [address]  CBG monitoring, ED     Status: None   Collection Time: 01/21/2019  3:23 PM  Result Value Ref Range   Glucose-Capillary 90 70 - 99 mg/dL  I-stat troponin, ED     Status: None   Collection Time: 01/28/2019  3:28 PM  Result Value Ref Range   Troponin i, poc 0.02 0.00 - 0.08 ng/mL   Comment 3            Comment: Due to the release kinetics of cTnI, a negative result within the first hours of the onset of symptoms does not rule out myocardial infarction with certainty. If myocardial infarction is still suspected, repeat the test at appropriate intervals.    Dg Chest 2 View  Result Date: 01/04/2019 CLINICAL DATA:  Weakness and mental status change x1 day. Chest pain x2 days radiating to the upper abdomen and along right side of ribs. EXAM: CHEST - 2 VIEW COMPARISON:  CT 12/14/2018 FINDINGS: Obscuration of the right hemidiaphragm and right heart border from known right pleural effusion and presumed pulmonary mass though this is obscured by the effusion. No significant change is identified. Mild central vascular congestion is seen. Left lung is relatively clear with port catheter in place from left IJ approach. The tip terminates in the distal SVC. No aggressive osseous lesions. No pneumothorax. IMPRESSION: Obscuration of the right hemidiaphragm and right heart border from known right small to moderate pleural effusion and presumed pulmonary mass. No significant change from prior. Electronically Signed   By: Ashley Royalty M.D.   On: 01/10/2019 15:26   Ct Angio Chest Pe W And/or Wo Contrast  Result Date: 01/04/2019 CLINICAL DATA:  Lung cancer, weakness and mental status changes for 1 day, nausea, vomiting, chest pain for 2 days radiating from mid chest upper abdomen and down RIGHT side of ribs, high pretest probability for pulmonary embolism,  additional past history of chronic bronchitis, GERD, hypertension, former smoker EXAM: CT ANGIOGRAPHY CHEST CT ABDOMEN AND PELVIS WITH CONTRAST TECHNIQUE: Multidetector CT imaging of the chest was performed using the standard protocol during bolus administration of intravenous contrast. Multiplanar CT image reconstructions and MIPs were obtained to evaluate the vascular anatomy. Multidetector CT imaging of the abdomen and pelvis was performed using the standard protocol during bolus administration of intravenous contrast. CONTRAST:  58mL ISOVUE-370 IOPAMIDOL (ISOVUE-370) INJECTION 76% IV. No oral contrast. COMPARISON:  CT chest abdomen and pelvis 12/14/2018 FINDINGS: CTA CHEST FINDINGS Cardiovascular: Atherosclerotic calcifications aorta, proximal great vessels and minimally in coronary arteries. Upper and origin of RIGHT subclavian artery arising last from aortic arch and coursing posterior to esophagus. Aorta normal caliber without aneurysm or dissection. Heart normal size. No pericardial effusion. Pulmonary arteries adequately opacified and patent. No evidence of pulmonary embolism. LEFT jugular Port-A-Cath with tip in SVC near cavoatrial junction. Mediastinum/Nodes: No definite esophageal abnormalities. Base of cervical region normal appearance. Enlarged precarinal lymph node 15 mm short axis image 34. 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 Central gas/cavitation increased since previous exam. Mass abuts the descending thoracic aorta, RIGHT pulmonary artery, descending lobar intra pulmonary artery, and RIGHT mainstem bronchus, with occlusion of the bronchus intermedius. Complete opacification of the RIGHT middle lobe, with significant atelectasis and consolidation of the basilar segments of the RIGHT lower lobe. Infiltrates are present in the superior segment of  the RIGHT lower lobe, could represent postobstructive pneumonitis or lymphangitic tumor spread. Partially loculated moderate RIGHT pleural effusion with compressive atelectasis of the posterior RIGHT upper lobe. Minimal dependent atelectasis in LEFT lower lobe. LEFT upper lobe nodule 7 mm diameter image 52 not significantly changed. Musculoskeletal: No osseous metastatic lesions. Review of the MIP images confirms the above findings. CT ABDOMEN and PELVIS FINDINGS Hepatobiliary: Small cyst RIGHT lobe liver 13 x 10 mm unchanged. Mild intrahepatic biliary dilatation question physiologic post cholecystectomy, unchanged. Pancreas: Normal appearance Spleen: Normal appearance Adrenals/Urinary Tract: Adrenal glands normal appearance. Cyst at inferior pole RIGHT kidney anteriorly 2.3 x 2.2 cm image 41. Kidneys, ureters, and bladder otherwise normal appearance. Stomach/Bowel: Increased stool in rectum. Appendix surgically absent by history. Gastric wall appears prominent though this may be an artifact related to underdistention. Bowel loops otherwise normal appearance. Vascular/Lymphatic: Atherosclerotic calcifications aorta and iliac arteries. Aorta normal caliber. No adenopathy. Scattered pelvic phleboliths. Reproductive: Uterus surgically absent nonvisualization of ovaries. Other: No free air or free fluid.  No hernia. Musculoskeletal: Osseous structures demineralized without evidence of osseous metastasis. Review of the MIP images confirms the above findings. IMPRESSION: No evidence of pulmonary embolism. 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 Increased atelectasis and consolidation of the RIGHT lower lobe and RIGHT middle lobe with additional infiltrate in the superior segment of the RIGHT lower lobe which could represent postobstructive pneumonitis or lymphangitic tumor spread.  Increased RIGHT pleural effusion, at least partially loculated. Increased stool in rectum. No other acute intra-abdominal or intrapelvic abnormalities. Electronically Signed   By: Lavonia Dana M.D.   On: 01/02/2019 18:46   Ct Abdomen Pelvis W Contrast  Result Date: 01/07/2019 CLINICAL DATA:  Lung cancer, weakness and mental status changes for 1 day, nausea, vomiting, chest pain for 2 days radiating from mid chest upper abdomen and down RIGHT side of ribs, high pretest probability for pulmonary embolism, additional past history of chronic bronchitis, GERD, hypertension, former smoker EXAM: CT ANGIOGRAPHY CHEST CT ABDOMEN AND PELVIS WITH CONTRAST TECHNIQUE: Multidetector CT imaging of the chest was performed using the standard protocol during bolus administration of intravenous contrast. Multiplanar CT image reconstructions and MIPs were obtained to evaluate the vascular anatomy. Multidetector CT imaging of the abdomen and pelvis was performed using the standard protocol during bolus administration of intravenous contrast. CONTRAST:  38mL ISOVUE-370 IOPAMIDOL (ISOVUE-370) INJECTION 76% IV. No oral contrast. COMPARISON:  CT chest abdomen and pelvis 12/14/2018 FINDINGS: CTA CHEST FINDINGS Cardiovascular: Atherosclerotic calcifications aorta, proximal great vessels and minimally in coronary arteries. Upper and origin of RIGHT subclavian artery arising last from aortic arch and coursing posterior to esophagus. Aorta normal caliber without aneurysm or dissection. Heart normal size. No pericardial effusion. Pulmonary arteries adequately opacified and patent. No evidence of pulmonary embolism. LEFT jugular Port-A-Cath with tip in SVC near cavoatrial junction. Mediastinum/Nodes: No definite esophageal abnormalities. Base of cervical region normal appearance. Enlarged precarinal lymph node 15 mm short axis image 34. Lungs/Pleura: Large necrotic tumor mass again identified involving the RIGHT  hilum, mediastinum, and RIGHT  infrahilar region into medial RIGHT lower lobe, approximately 6.0 x 3.9 cm image 42. Central gas/cavitation increased since previous exam. Mass abuts the descending thoracic aorta, RIGHT pulmonary artery, descending lobar intra pulmonary artery, and RIGHT mainstem bronchus, with occlusion of the bronchus intermedius. Complete opacification of the RIGHT middle lobe, with significant atelectasis and consolidation of the basilar segments of the RIGHT lower lobe. Infiltrates are present in the superior segment of the RIGHT lower lobe, could represent postobstructive pneumonitis or lymphangitic tumor spread. Partially loculated moderate RIGHT pleural effusion with compressive atelectasis of the posterior RIGHT upper lobe. Minimal dependent atelectasis in LEFT lower lobe. LEFT upper lobe nodule 7 mm diameter image 52 not significantly changed. Musculoskeletal: No osseous metastatic lesions. Review of the MIP images confirms the above findings. CT ABDOMEN and PELVIS FINDINGS Hepatobiliary: Small cyst RIGHT lobe liver 13 x 10 mm unchanged. Mild intrahepatic biliary dilatation question physiologic post cholecystectomy, unchanged. Pancreas: Normal appearance Spleen: Normal appearance Adrenals/Urinary Tract: Adrenal glands normal appearance. Cyst at inferior pole RIGHT kidney anteriorly 2.3 x 2.2 cm image 41. Kidneys, ureters, and bladder otherwise normal appearance. Stomach/Bowel: Increased stool in rectum. Appendix surgically absent by history. Gastric wall appears prominent though this may be an artifact related to underdistention. Bowel loops otherwise normal appearance. Vascular/Lymphatic: Atherosclerotic calcifications aorta and iliac arteries. Aorta normal caliber. No adenopathy. Scattered pelvic phleboliths. Reproductive: Uterus surgically absent nonvisualization of ovaries. Other: No free air or free fluid.  No hernia. Musculoskeletal: Osseous structures demineralized without evidence of osseous metastasis. Review of  the MIP images confirms the above findings. IMPRESSION: No evidence of pulmonary embolism. 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 Increased atelectasis and consolidation of the RIGHT lower lobe and RIGHT middle lobe with additional infiltrate in the superior segment of the RIGHT lower lobe which could represent postobstructive pneumonitis or lymphangitic tumor spread. Increased RIGHT pleural effusion, at least partially loculated. Increased stool in rectum. No other acute intra-abdominal or intrapelvic abnormalities. Electronically Signed   By: Lavonia Dana M.D.   On: 01/27/2019 18:46    Pending Labs Unresulted Labs (From admission, onward)    Start     Ordered   01/13/19 0500  CBC WITH DIFFERENTIAL  Tomorrow morning,   R     01/01/2019 1941   01/13/19 0500  Comprehensive metabolic panel  Tomorrow morning,   R     01/15/2019 1941   01/26/2019 1941  Influenza panel by PCR (type A & B)  (Influenza PCR Panel)  Once,   R     01/14/2019 1941   01/13/2019 1940  Culture, blood (routine x 2) Call MD if unable to obtain prior to antibiotics being given  BLOOD CULTURE X 2,   R    Comments:  If blood cultures drawn in Emergency Department - Do not draw and cancel order    01/11/2019 1941   01/11/2019 1940  Culture, sputum-assessment  Once,   R     01/08/2019 1941   01/03/2019 1940  Gram stain  Once,   R     01/19/2019 1941   01/24/2019 1940  Strep pneumoniae urinary antigen  Once,   R     01/15/2019 1941   01/07/2019 1523  Blood culture (routine x 2)  BLOOD CULTURE X 2,   STAT     01/04/2019 1522  Vitals/Pain Today's Vitals   01/13/2019 1841 01/24/2019 1921 01/07/2019 1940 01/17/2019 1951  BP: (!) 104/55 (!) 103/55  (!) 103/55  Pulse: 89 86  88  Resp: (!) 22 (!) 23  (!) 24  Temp:      TempSrc:      SpO2: 99% 97%  99%  PainSc:   0-No pain     Isolation Precautions Droplet  precaution  Medications Medications  vancomycin (VANCOCIN) IVPB 1000 mg/200 mL premix (1,000 mg Intravenous New Bag/Given 01/06/2019 1924)  loratadine (CLARITIN) tablet 10 mg (has no administration in time range)  mometasone (ELOCON) 0.1 % ointment 1 application (has no administration in time range)  fluticasone (FLONASE) 50 MCG/ACT nasal spray 1 spray (has no administration in time range)  diphenhydrAMINE (BENADRYL) tablet 50 mg (has no administration in time range)  albuterol (PROVENTIL HFA;VENTOLIN HFA) 108 (90 Base) MCG/ACT inhaler 1-2 puff (has no administration in time range)  gabapentin (NEURONTIN) capsule 300 mg (has no administration in time range)  clopidogrel (PLAVIX) tablet 75 mg (has no administration in time range)  Oxycodone HCl TABS 20 mg (has no administration in time range)  potassium chloride 20 MEQ/15ML (10%) solution 40 mEq (has no administration in time range)  linaclotide (LINZESS) capsule 290-580 mcg (has no administration in time range)  lubiprostone (AMITIZA) capsule 24 mcg (has no administration in time range)  tiZANidine (ZANAFLEX) tablet 4 mg (has no administration in time range)  ibuprofen (ADVIL,MOTRIN) tablet 800 mg (has no administration in time range)  sodium chloride flush (NS) 0.9 % injection 3 mL (has no administration in time range)  sodium chloride flush (NS) 0.9 % injection 3 mL (has no administration in time range)  0.9 %  sodium chloride infusion (has no administration in time range)  ceFEPIme (MAXIPIME) 1 g in sodium chloride 0.9 % 100 mL IVPB (has no administration in time range)  sodium chloride flush (NS) 0.9 % injection 3 mL (3 mLs Intravenous Given 01/28/2019 1446)  sodium chloride 0.9 % bolus 1,000 mL (0 mLs Intravenous Stopped 01/15/2019 1610)  iopamidol (ISOVUE-370) 76 % injection 100 mL (80 mLs Intravenous Contrast Given 01/27/2019 1731)  ceFEPIme (MAXIPIME) 2 g in sodium chloride 0.9 % 100 mL IVPB (2 g Intravenous New Bag/Given 01/27/2019 1921)     Mobility walks with person assist

## 2019-01-12 NOTE — ED Notes (Signed)
Report given to Stephanie, RN on 6E. 

## 2019-01-12 NOTE — H&P (Signed)
History and Physical    Faith [name] JOA:[id number] DOB: 1951-01-29 DOA: 01/28/2019  PCP: [name], MD  Patient coming from: Home  Chief Complaint: Shortness of breath cough and chills  HPI: Faith [name] is a 68 y.o. female with medical history significant of stage IV lung cancer diagnosed a year ago undergoing chemotherapy and radiation, chronic abdominal pain and lower back pain, bipolar disorder comes in with a couple of days of coughing that has been productive and nonbloody with associated subjective fevers and chills at home.  Patient also reports shortness of breath.  She was getting radiation today when they noticed her oxygen sats were a little low so they sent her to the emergency department for further evaluation for PE.  Patient denies any lower extremity swelling or edema.  She denies chest pain but does endorse right upper quadrant abdominal pain which is been present since her cancer is been found with mets.  Patient found to have postobstructive pneumonia and referred for admission for such.  She was hospitalized November but has not had recent a biotics.  She is being covered with cefepime and vancomycin.  Review of Systems: As per HPI otherwise 10 point review of systems negative.   Past Medical History:  Diagnosis Date  . Anxiety   . Arthritis    "thighs; legs; hips" (07/05/2014)  . Bipolar disorder ([place])   . Cancer ([place])   . Cholelithiasis 07/2014  . Chronic bronchitis ([place])    "get it q yr"  . Chronic lower back pain   . Depression    pt. use to go to depression clinic, states she still has depression & anxiety but can't get to appt. so she hasn't had any med. for it in a while   . Dyspnea   . GERD (gastroesophageal reflux disease)   . High cholesterol   . History of radiation therapy 02/01/18- 02/12/18   Right Neck/ 30 Gy in 10 fractions.   . Hypertension   . Mass in neck 12/2017   RIGHT SIDE OF NECK  . Peripheral vascular disease ([place])   . Schizophrenia  ([place])   . 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 ABDOMINAL AORTOGRAM N/A 01/07/2017   Procedure: Abdominal Aortogram;  Surgeon: Waynetta Sandy, MD;  Location: [place] [facility];  Service: Cardiovascular;  Laterality: N/A;  . ABDOMINAL HYSTERECTOMY    . ANKLE FRACTURE SURGERY Right   . ANKLE HARDWARE REMOVAL Right   . APPENDECTOMY  ~ 1963  . BREAST BIOPSY Bilateral   . BREAST CYST EXCISION Bilateral    "not cancer"  . CATARACT EXTRACTION W/ INTRAOCULAR LENS  IMPLANT, BILATERAL    . CHOLECYSTECTOMY N/A 07/05/2014   Procedure: LAPAROSCOPIC CHOLECYSTECTOMY WITH INTRAOPERATIVE CHOLANGIOGRAM;  Surgeon: Gayland Curry, MD;  Location: [place];  Service: General;  Laterality: N/A;  . EXCISIONAL HEMORRHOIDECTOMY    . FEMORAL-POPLITEAL BYPASS GRAFT Right 01/15/2017   Procedure: BYPASS GRAFT FEMORAL-POPLITEAL ARTERY RIGHT LEG;  Surgeon: Waynetta Sandy, MD;  Location: [place];  Service: Vascular;  Laterality: Right;  . IR IMAGING GUIDED PORT INSERTION  03/08/2018  . IR US GUIDE VASC ACCESS LEFT  03/08/2018  . LAPAROSCOPIC CHOLECYSTECTOMY  07/05/2014  . LOWER EXTREMITY ANGIOGRAPHY Bilateral 01/07/2017   Procedure: Lower Extremity Angiography;  Surgeon: Waynetta Sandy, MD;  Location: [place] [facility];  Service: Cardiovascular;  Laterality: Bilateral;  . MASS BIOPSY Right 12/25/2017  Procedure: INCISIONAL RIGHT NECK MASS BIOPSY;  Surgeon: Helayne Seminole, MD;  Location: [place];  Service: ENT;  Laterality: Right;  . MULTIPLE TOOTH EXTRACTIONS  05/2013   "pulled my upper teeth"  . PILONIDAL CYST EXCISION       reports that she quit smoking about a year ago. Her smoking use included cigarettes. She has a 48.00 pack-year smoking history. She has never used smokeless tobacco. She reports that she does not drink alcohol or use drugs.  Allergies  Allergen Reactions  . Penicillins Itching and Other (See Comments)    Causes  yeast infections  PATIENT HAS HAD A PCN REACTION WITH IMMEDIATE RASH, FACIAL/TONGUE/THROAT SWELLING, SOB, OR LIGHTHEADEDNESS WITH HYPOTENSION:  #  #  #  YES  #  #  #   Has patient had a PCN reaction causing severe rash involving mucus membranes or skin necrosis: Unk Has patient had a PCN reaction that required hospitalization: No Has patient had a PCN reaction occurring within the last 10 years: #  #  #  YES  #  #  #  If all of the above answers are "NO", then may proceed with Cephalosporin   . Codeine Itching and Other (See Comments)    And yeast infections  . Percocet [Oxycodone-Acetaminophen] Itching    Tolerates with Benadryl    Family History  Problem Relation Age of Onset  . Cancer Mother 76       colon  . Diabetes Mother   . Hypertension Mother   . Cancer Father   . Diabetes Father   . Hypertension Father     Prior to Admission medications   Medication Sig Start Date End Date Taking? Authorizing Provider  albuterol (PROVENTIL HFA;VENTOLIN HFA) 108 (90 Base) MCG/ACT inhaler Inhale 1-2 puffs into the lungs every 6 (six) hours as needed for wheezing or shortness of breath.   Yes [provider]  AMITIZA 24 MCG capsule Take 24 mcg by mouth 2 (two) times daily with a meal.  12/15/18  Yes [provider]  clopidogrel (PLAVIX) 75 MG tablet Take 1 tablet (75 mg total) by mouth daily. 02/19/17  Yes Hongalgi, Lenis Dickinson, MD  diphenhydrAMINE (BENADRYL) 25 MG tablet Take 2 tablets (50 mg total) by mouth at bedtime as needed for sleep. 09/11/15  Yes Evelina Bucy, MD  fluticasone (FLONASE) 50 MCG/ACT nasal spray Place 1 spray into both nostrils daily.  09/10/15  Yes [provider]  furosemide (LASIX) 20 MG tablet Take 20 mg by mouth daily as needed for fluid or edema.    Yes [provider]  gabapentin (NEURONTIN) 300 MG capsule Take 300 mg by mouth 3 (three) times daily.    Yes [provider]  ibuprofen (ADVIL,MOTRIN) 800 MG tablet Take 800 mg by  mouth every 8 (eight) hours as needed for headache or mild pain.   Yes [provider]  linaclotide (LINZESS) 290 MCG CAPS capsule Take 1-2 capsules (290-580 mcg total) by mouth daily as needed (constipation). 10/25/18  Yes Regalado, Belkys A, MD  loratadine (CLARITIN) 10 MG tablet Take 10 mg by mouth daily.   Yes [provider]  mometasone (ELOCON) 0.1 % ointment Apply 1 application topically daily as needed (irritation).    Yes [provider]  omeprazole (PRILOSEC) 20 MG capsule Take 20 mg by mouth 2 (two) times daily before a meal.   Yes [provider]  Oxycodone HCl 20 MG TABS Take 1 tablet by mouth 4 (four) times  daily as needed (pain).  02/03/18  Yes [provider]  potassium chloride 20 MEQ/15ML (10%) SOLN Take 30 mLs by mouth daily. 10/07/18  Yes [provider]  sucralfate (CARAFATE) 1 g tablet Dissolve 1 tablet in 10 mL H20 and swallow 20 min prior to meals and bedtime. 12/28/18  Yes Eppie Gibson, MD  tiZANidine (ZANAFLEX) 4 MG tablet Take 4 mg by mouth 2 (two) times daily.  11/19/18  Yes [provider]  guaiFENesin-dextromethorphan (ROBITUSSIN DM) 100-10 MG/5ML syrup Take 5 mLs by mouth every 4 (four) hours as needed for cough. Patient not taking: Reported on 01/03/2019 10/25/18   Regalado, Jerald Kief A, MD  lactulose (CHRONULAC) 10 GM/15ML solution TAKE 30 MLS BY MOUTH DAILY AS NEEDED FOR CONSTIPATION Patient not taking: Reported on 01/04/2019 11/26/18   Curt Bears, MD  lidocaine (XYLOCAINE) 2 % solution Caregiver: Mix 1part 2% viscous lidocaine, 1part H20. Swish & swallow 64mL of diluted mixture, 23min before meals and at bedtime, up to QID Patient not taking: Reported on 01/11/2019 02/12/18   Eppie Gibson, MD  lidocaine-prilocaine (EMLA) cream Apply 1 application topically as needed. Patient not taking: Reported on 01/26/2019 09/22/18   Curt Bears, MD  nystatin (MYCOSTATIN) 100000 UNIT/ML suspension Take 5 mLs (500,000  Units total) by mouth 4 (four) times daily. Patient not taking: Reported on 01/07/2019 10/25/18   Regalado, Jerald Kief A, MD  polyethylene glycol (MIRALAX / GLYCOLAX) packet Take 17 g by mouth 2 (two) times daily. Patient not taking: Reported on 01/28/2019 10/25/18   Regalado, Jerald Kief A, MD  senna-docusate (SENOKOT-S) 8.6-50 MG tablet Take 1 tablet by mouth 2 (two) times daily. Patient not taking: Reported on 01/21/2019 10/25/18   Regalado, Jerald Kief A, MD  sucralfate (CARAFATE) 1 GM/10ML suspension Take 10 mLs (1 g total) by mouth 4 (four) times daily -  with meals and at bedtime. Take 20 minutes before meals. Patient not taking: Reported on 01/16/2019 12/28/18   Eppie Gibson, MD    Physical Exam: Vitals:   01/17/2019 1800 01/16/2019 1832 01/14/2019 1841 01/23/2019 1921  BP: 116/64  (!) 104/55 (!) 103/55  Pulse: 83 84 89 86  Resp: (!) 22 20 (!) 22 (!) 23  Temp:      TempSrc:      SpO2: 95% 96% 99% 97%      Constitutional: NAD, calm, comfortable Vitals:   01/09/2019 1800 01/06/2019 1832 01/19/2019 1841 01/07/2019 1921  BP: 116/64  (!) 104/55 (!) 103/55  Pulse: 83 84 89 86  Resp: (!) 22 20 (!) 22 (!) 23  Temp:      TempSrc:      SpO2: 95% 96% 99% 97%   Eyes: PERRL, lids and conjunctivae normal ENMT: Mucous membranes are moist. Posterior pharynx clear of any exudate or lesions.Normal dentition.  Neck: normal, supple, no masses, no thyromegaly Port-A-Cath looks good noninfected Respiratory: clear to auscultation bilaterally, no wheezing, no crackles. Normal respiratory effort. No accessory muscle use.  Cardiovascular: Regular rate and rhythm, no murmurs / rubs / gallops. No extremity edema. 2+ pedal pulses. No carotid bruits.  Abdomen: no tenderness, no masses palpated. No hepatosplenomegaly. Bowel sounds positive.  Musculoskeletal: no clubbing / cyanosis. No joint deformity upper and lower extremities. Good ROM, no contractures. Normal muscle tone.  Skin: no rashes, lesions, ulcers. No  induration Neurologic: CN 2-12 grossly intact. Sensation intact, DTR normal. Strength 5/5 in all 4.  Psychiatric: Normal judgment and insight. Alert and oriented x 3. Normal mood.    Labs on Admission: I  have personally reviewed following labs and imaging studies  CBC: Recent Labs  Lab 01/08/2019 1441  WBC 11.0*  HGB 9.7*  HCT 28.1*  MCV 84.1  PLT 557   Basic Metabolic Panel: Recent Labs  Lab 01/28/2019 1441  NA 132*  K 4.0  CL 102  CO2 22  GLUCOSE 94  BUN 30*  CREATININE 0.75  CALCIUM 10.8*   GFR: Estimated Creatinine Clearance: 52.2 mL/min (by C-G formula based on SCr of 0.75 mg/dL). Liver Function Tests: Recent Labs  Lab 01/27/2019 1441  AST 53*  ALT 23  ALKPHOS 95  BILITOT 3.8*  PROT 6.8  ALBUMIN 2.8*   Recent Labs  Lab 01/24/2019 1441  LIPASE 19   No results for input(s): AMMONIA in the last 168 hours. Coagulation Profile: No results for input(s): INR, PROTIME in the last 168 hours. Cardiac Enzymes: Recent Labs  Lab 01/23/2019 1441  TROPONINI <0.03   BNP (last 3 results) No results for input(s): PROBNP in the last 8760 hours. HbA1C: No results for input(s): HGBA1C in the last 72 hours. CBG: Recent Labs  Lab 01/04/2019 1523  GLUCAP 90   Lipid Profile: No results for input(s): CHOL, HDL, LDLCALC, TRIG, CHOLHDL, LDLDIRECT in the last 72 hours. Thyroid Function Tests: No results for input(s): TSH, T4TOTAL, FREET4, T3FREE, THYROIDAB in the last 72 hours. Anemia Panel: No results for input(s): VITAMINB12, FOLATE, FERRITIN, TIBC, IRON, RETICCTPCT in the last 72 hours. Urine analysis:    Component Value Date/Time   COLORURINE STRAW (A) 01/13/2017 1213   APPEARANCEUR CLEAR 01/13/2017 1213   LABSPEC 1.009 01/13/2017 1213   PHURINE 6.0 01/13/2017 1213   GLUCOSEU NEGATIVE 01/13/2017 1213   HGBUR NEGATIVE 01/13/2017 1213   BILIRUBINUR NEGATIVE 01/13/2017 1213   KETONESUR NEGATIVE 01/13/2017 1213   PROTEINUR NEGATIVE 01/13/2017 1213   UROBILINOGEN 0.2  09/11/2015 1300   NITRITE NEGATIVE 01/13/2017 1213   LEUKOCYTESUR NEGATIVE 01/13/2017 1213   Sepsis Labs: !!!!!!!!!!!!!!!!!!!!!!!!!!!!!!!!!!!!!!!!!!!! @LABRCNTIP (procalcitonin:4,lacticidven:4) )No results found for this or any previous visit (from the past 240 hour(s)).   Radiological Exams on Admission: Dg Chest 2 View  Result Date: 01/10/2019 CLINICAL DATA:  Weakness and mental status change x1 day. Chest pain x2 days radiating to the upper abdomen and along right side of ribs. EXAM: CHEST - 2 VIEW COMPARISON:  CT 12/14/2018 FINDINGS: Obscuration of the right hemidiaphragm and right heart border from known right pleural effusion and presumed pulmonary mass though this is obscured by the effusion. No significant change is identified. Mild central vascular congestion is seen. Left lung is relatively clear with port catheter in place from left IJ approach. The tip terminates in the distal SVC. No aggressive osseous lesions. No pneumothorax. IMPRESSION: Obscuration of the right hemidiaphragm and right heart border from known right small to moderate pleural effusion and presumed pulmonary mass. No significant change from prior. Electronically Signed   By: Ashley Royalty M.D.   On: 01/05/2019 15:26   Ct Angio Chest Pe W And/or Wo Contrast  Result Date: 01/24/2019 CLINICAL DATA:  Lung cancer, weakness and mental status changes for 1 day, nausea, vomiting, chest pain for 2 days radiating from mid chest upper abdomen and down RIGHT side of ribs, high pretest probability for pulmonary embolism, additional past history of chronic bronchitis, GERD, hypertension, former smoker EXAM: CT ANGIOGRAPHY CHEST CT ABDOMEN AND PELVIS WITH CONTRAST TECHNIQUE: Multidetector CT imaging of the chest was performed using the standard protocol during bolus administration of intravenous contrast. Multiplanar CT image reconstructions and MIPs were  obtained to evaluate the vascular anatomy. Multidetector CT imaging of the abdomen and  pelvis was performed using the standard protocol during bolus administration of intravenous contrast. CONTRAST:  27mL ISOVUE-370 IOPAMIDOL (ISOVUE-370) INJECTION 76% IV. No oral contrast. COMPARISON:  CT chest abdomen and pelvis 12/14/2018 FINDINGS: CTA CHEST FINDINGS Cardiovascular: Atherosclerotic calcifications aorta, proximal great vessels and minimally in coronary arteries. Upper and origin of RIGHT subclavian artery arising last from aortic arch and coursing posterior to esophagus. Aorta normal caliber without aneurysm or dissection. Heart normal size. No pericardial effusion. Pulmonary arteries adequately opacified and patent. No evidence of pulmonary embolism. LEFT jugular Port-A-Cath with tip in SVC near cavoatrial junction. Mediastinum/Nodes: No definite esophageal abnormalities. Base of cervical region normal appearance. Enlarged precarinal lymph node 15 mm short axis image 34. Lungs/Pleura: Large necrotic tumor mass again identified involving the RIGHT hilum, mediastinum, and RIGHT infrahilar region into medial RIGHT lower lobe, approximately 6.0 x 3.9 cm image 42. Central gas/cavitation increased since previous exam. Mass abuts the descending thoracic aorta, RIGHT pulmonary artery, descending lobar intra pulmonary artery, and RIGHT mainstem bronchus, with occlusion of the bronchus intermedius. Complete opacification of the RIGHT middle lobe, with significant atelectasis and consolidation of the basilar segments of the RIGHT lower lobe. Infiltrates are present in the superior segment of the RIGHT lower lobe, could represent postobstructive pneumonitis or lymphangitic tumor spread. Partially loculated moderate RIGHT pleural effusion with compressive atelectasis of the posterior RIGHT upper lobe. Minimal dependent atelectasis in LEFT lower lobe. LEFT upper lobe nodule 7 mm diameter image 52 not significantly changed. Musculoskeletal: No osseous metastatic lesions. Review of the MIP images confirms the  above findings. CT ABDOMEN and PELVIS FINDINGS Hepatobiliary: Small cyst RIGHT lobe liver 13 x 10 mm unchanged. Mild intrahepatic biliary dilatation question physiologic post cholecystectomy, unchanged. Pancreas: Normal appearance Spleen: Normal appearance Adrenals/Urinary Tract: Adrenal glands normal appearance. Cyst at inferior pole RIGHT kidney anteriorly 2.3 x 2.2 cm image 41. Kidneys, ureters, and bladder otherwise normal appearance. Stomach/Bowel: Increased stool in rectum. Appendix surgically absent by history. Gastric wall appears prominent though this may be an artifact related to underdistention. Bowel loops otherwise normal appearance. Vascular/Lymphatic: Atherosclerotic calcifications aorta and iliac arteries. Aorta normal caliber. No adenopathy. Scattered pelvic phleboliths. Reproductive: Uterus surgically absent nonvisualization of ovaries. Other: No free air or free fluid.  No hernia. Musculoskeletal: Osseous structures demineralized without evidence of osseous metastasis. Review of the MIP images confirms the above findings. IMPRESSION: No evidence of pulmonary embolism. 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 Increased atelectasis and consolidation of the RIGHT lower lobe and RIGHT middle lobe with additional infiltrate in the superior segment of the RIGHT lower lobe which could represent postobstructive pneumonitis or lymphangitic tumor spread. Increased RIGHT pleural effusion, at least partially loculated. Increased stool in rectum. No other acute intra-abdominal or intrapelvic abnormalities. Electronically Signed   By: Lavonia Dana M.D.   On: 01/08/2019 18:46   Ct Abdomen Pelvis W Contrast  Result Date: 01/16/2019 CLINICAL DATA:  Lung cancer, weakness and mental status changes for 1 day, nausea, vomiting, chest pain for 2 days radiating from mid chest  upper abdomen and down RIGHT side of ribs, high pretest probability for pulmonary embolism, additional past history of chronic bronchitis, GERD, hypertension, former smoker EXAM: CT ANGIOGRAPHY CHEST CT ABDOMEN AND PELVIS WITH CONTRAST TECHNIQUE: Multidetector CT imaging of the chest was performed using the standard protocol  during bolus administration of intravenous contrast. Multiplanar CT image reconstructions and MIPs were obtained to evaluate the vascular anatomy. Multidetector CT imaging of the abdomen and pelvis was performed using the standard protocol during bolus administration of intravenous contrast. CONTRAST:  71mL ISOVUE-370 IOPAMIDOL (ISOVUE-370) INJECTION 76% IV. No oral contrast. COMPARISON:  CT chest abdomen and pelvis 12/14/2018 FINDINGS: CTA CHEST FINDINGS Cardiovascular: Atherosclerotic calcifications aorta, proximal great vessels and minimally in coronary arteries. Upper and origin of RIGHT subclavian artery arising last from aortic arch and coursing posterior to esophagus. Aorta normal caliber without aneurysm or dissection. Heart normal size. No pericardial effusion. Pulmonary arteries adequately opacified and patent. No evidence of pulmonary embolism. LEFT jugular Port-A-Cath with tip in SVC near cavoatrial junction. Mediastinum/Nodes: No definite esophageal abnormalities. Base of cervical region normal appearance. Enlarged precarinal lymph node 15 mm short axis image 34. Lungs/Pleura: Large necrotic tumor mass again identified involving the RIGHT hilum, mediastinum, and RIGHT infrahilar region into medial RIGHT lower lobe, approximately 6.0 x 3.9 cm image 42. Central gas/cavitation increased since previous exam. Mass abuts the descending thoracic aorta, RIGHT pulmonary artery, descending lobar intra pulmonary artery, and RIGHT mainstem bronchus, with occlusion of the bronchus intermedius. Complete opacification of the RIGHT middle lobe, with significant atelectasis and consolidation of the  basilar segments of the RIGHT lower lobe. Infiltrates are present in the superior segment of the RIGHT lower lobe, could represent postobstructive pneumonitis or lymphangitic tumor spread. Partially loculated moderate RIGHT pleural effusion with compressive atelectasis of the posterior RIGHT upper lobe. Minimal dependent atelectasis in LEFT lower lobe. LEFT upper lobe nodule 7 mm diameter image 52 not significantly changed. Musculoskeletal: No osseous metastatic lesions. Review of the MIP images confirms the above findings. CT ABDOMEN and PELVIS FINDINGS Hepatobiliary: Small cyst RIGHT lobe liver 13 x 10 mm unchanged. Mild intrahepatic biliary dilatation question physiologic post cholecystectomy, unchanged. Pancreas: Normal appearance Spleen: Normal appearance Adrenals/Urinary Tract: Adrenal glands normal appearance. Cyst at inferior pole RIGHT kidney anteriorly 2.3 x 2.2 cm image 41. Kidneys, ureters, and bladder otherwise normal appearance. Stomach/Bowel: Increased stool in rectum. Appendix surgically absent by history. Gastric wall appears prominent though this may be an artifact related to underdistention. Bowel loops otherwise normal appearance. Vascular/Lymphatic: Atherosclerotic calcifications aorta and iliac arteries. Aorta normal caliber. No adenopathy. Scattered pelvic phleboliths. Reproductive: Uterus surgically absent nonvisualization of ovaries. Other: No free air or free fluid.  No hernia. Musculoskeletal: Osseous structures demineralized without evidence of osseous metastasis. Review of the MIP images confirms the above findings. IMPRESSION: No evidence of pulmonary embolism. 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 Increased atelectasis and consolidation of the RIGHT lower lobe and RIGHT middle lobe with additional infiltrate in the superior segment of the RIGHT  lower lobe which could represent postobstructive pneumonitis or lymphangitic tumor spread. Increased RIGHT pleural effusion, at least partially loculated. Increased stool in rectum. No other acute intra-abdominal or intrapelvic abnormalities. Electronically Signed   By: Lavonia Dana M.D.   On: 01/23/2019 18:46   Old chart reviewed Case discussed with Dr. Darl Householder in the ED  Assessment/Plan 68 year old female with stage IV lung cancer comes in with postobstructive pneumonia Principal Problem:   PNA (pneumonia)-CT shows evidence of postobstructive pneumonia.  Placed on vancomycin and cefepime.  Obtain blood and sputum cultures.  Lactic acid level normal.  Blood pressure stable.  Patient not septic.  Check quick flu.  O2 sats  are normal here.  Active Problems:   Non-small cell carcinoma of right lung, stage 4 (HCC)-hematology called and will see patient in the morning.    Right sided abdominal pain-continue her as needed oxycodone and adjust as needed.    Essential hypertension-patient only on Lasix as needed for edema at home and not on any other blood pressure medications.  Would not resume any at this time secondary to soft blood pressures.    Chronic pain-continue chronic home meds      DVT prophylaxis: SCDs Code Status: Full Family Communication: Daughter Disposition Plan: 1 to 3 days Consults called: Oncology Admission status: Admission   Thompson Mckim A MD [facility]  If 7PM-7AM, please contact night-coverage [email or web address] Password [facility]  01/24/2019, 7:38 PM

## 2019-01-12 NOTE — ED Notes (Signed)
Bed: RESA Expected date:  Expected time:  Means of arrival:  Comments: EMS/Cancer Center/chest pain

## 2019-01-12 NOTE — ED Triage Notes (Addendum)
Patient sent over from cancer center. Weakness and mental status changes x1 day. Patient c/o chest pain x2 days. Chest pain radiates through mid chest to upper abdomen down right side of ribs.   MD at cancer center worried about PE or DVT.    Lung Cancer that has metastasized.   Difficulty following conversation.   Usually A/Ox4 A/Ox4 per daughter but, delay in following commands.   N/V since Saturday.   Full Code.  Daughter at bedside.

## 2019-01-12 NOTE — Progress Notes (Signed)
Faith [name] presents after her second radiation treatment to her chest today to see Dr. Isidore Moos. She voices complaints of chest tightness, chest pain, mental status changes, right calf pain, and cough with white sputum production. Her daughter is here with her. Vital signs as follows BP 104/57, pulse 94, resp 18, temp 98.6, 98% on RA. Dr. Isidore Moos requests that the patient go to the Emergency Room for evaluation. I called and spoke with the charge nurse and brought Faith [name] to the Emergency Dept. Report given to RN

## 2019-01-12 NOTE — ED Provider Notes (Addendum)
[facility] DEPT Provider Note   CSN: [id number] Arrival date & time: 01/06/2019  1424     History   Chief Complaint Chief Complaint  Patient presents with  . Weakness  . Altered Mental Status    HPI Faith [name] is a 68 y.o. female history of depression, metastatic lung cancer to the neck currently on radiation here presenting with weakness, shortness of breath, chest pain.  Patient was radiation today and had acute onset of chest pain as well as chest tightness and cough.  Patient states that the pain is on the right side of her chest and she had some chills for the last several days.  Pain is worse when she coughs.  Patient finished radiation today and was seen at the oncology office and was sent here for further evaluation.  Patient denies any history of blood clots and was noted to be slightly hypotensive and hypoxic in triage.  Patient has some epigastric pain as well.  The history is provided by the patient.    Past Medical History:  Diagnosis Date  . Anxiety   . Arthritis    "thighs; legs; hips" (07/05/2014)  . Bipolar disorder ([place])   . Cancer ([place])   . Cholelithiasis 07/2014  . Chronic bronchitis ([place])    "get it q yr"  . Chronic lower back pain   . Depression    pt. use to go to depression clinic, states she still has depression & anxiety but can't get to appt. so she hasn't had any med. for it in a while   . Dyspnea   . GERD (gastroesophageal reflux disease)   . High cholesterol   . History of radiation therapy 02/01/18- 02/12/18   Right Neck/ 30 Gy in 10 fractions.   . Hypertension   . Mass in neck 12/2017   RIGHT SIDE OF NECK  . Peripheral vascular disease ([place])   . Schizophrenia ([place])   . Stroke [facility])    caused left eye blindness    Patient Active Problem List   Diagnosis Date Noted  . Dysphagia 12/16/2018  . Fecal impaction in rectum ([place]) 10/20/2018  . Fecal impaction of rectum ([place]) 10/20/2018  . Chronic pain 10/20/2018    . Port-A-Cath in place 04/28/2018  . Encounter for antineoplastic chemotherapy 04/21/2018  . Encounter for antineoplastic immunotherapy 04/21/2018  . Non-small cell carcinoma of right lung, stage 4 ([place]) 02/23/2018  . 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 Hyperkalemia 12/23/2017  . HLD (hyperlipidemia) 12/22/2017  . GERD (gastroesophageal reflux disease) 12/22/2017  . History of stroke 12/22/2017  . Tobacco abuse 12/22/2017  . Mass in neck 12/22/2017  . Hypokalemia 12/22/2017  . Hypercalcemia 12/22/2017  . Nausea vomiting and diarrhea 12/22/2017  . AKI (acute kidney injury) ([place]) 12/22/2017  . Stroke-like symptom 02/14/2017  . Low potassium syndrome 02/14/2017  . PAD (peripheral artery disease) ([place]) 01/15/2017  . Nonhealing nonsurgical wound 12/05/2016  . Essential hypertension 07/06/2014  . Depression 07/06/2014  . Back pain 07/06/2014  . Chronic cholecystitis 07/05/2014  . Right sided abdominal pain 05/11/2014  . Cholelithiasis 05/11/2014    Past Surgical History:  Procedure Laterality Date  . ABDOMINAL AORTOGRAM N/A 01/07/2017   Procedure: Abdominal Aortogram;  Surgeon: Waynetta Sandy, MD;  Location: [facility];  Service: Cardiovascular;  Laterality: N/A;  . ABDOMINAL HYSTERECTOMY    . ANKLE FRACTURE SURGERY Right   . ANKLE HARDWARE  REMOVAL Right   . APPENDECTOMY  ~ 1963  . BREAST BIOPSY Bilateral   . BREAST CYST EXCISION Bilateral    "not cancer"  . CATARACT EXTRACTION W/ INTRAOCULAR LENS  IMPLANT, BILATERAL    . CHOLECYSTECTOMY N/A 07/05/2014   Procedure: LAPAROSCOPIC CHOLECYSTECTOMY WITH INTRAOPERATIVE CHOLANGIOGRAM;  Surgeon: Gayland Curry, MD;  Location: [facility];  Service: General;  Laterality: N/A;  . EXCISIONAL HEMORRHOIDECTOMY    . FEMORAL-POPLITEAL BYPASS GRAFT Right 01/15/2017   Procedure: BYPASS GRAFT FEMORAL-POPLITEAL ARTERY RIGHT LEG;  Surgeon: Waynetta Sandy, MD;   Location: [facility];  Service: Vascular;  Laterality: Right;  . IR IMAGING GUIDED PORT INSERTION  03/08/2018  . IR US GUIDE VASC ACCESS LEFT  03/08/2018  . LAPAROSCOPIC CHOLECYSTECTOMY  07/05/2014  . LOWER EXTREMITY ANGIOGRAPHY Bilateral 01/07/2017   Procedure: Lower Extremity Angiography;  Surgeon: Waynetta Sandy, MD;  Location: [facility];  Service: Cardiovascular;  Laterality: Bilateral;  . MASS BIOPSY Right 12/25/2017   Procedure: INCISIONAL RIGHT NECK MASS BIOPSY;  Surgeon: Helayne Seminole, MD;  Location: [facility];  Service: ENT;  Laterality: Right;  . MULTIPLE TOOTH EXTRACTIONS  05/2013   "pulled my upper teeth"  . PILONIDAL CYST EXCISION       OB History   No obstetric history on file.      Home Medications    Prior to Admission medications   Medication Sig Start Date End Date Taking? Authorizing Provider  albuterol (PROVENTIL HFA;VENTOLIN HFA) 108 (90 Base) MCG/ACT inhaler Inhale 1-2 puffs into the lungs every 6 (six) hours as needed for wheezing or shortness of breath.   Yes [provider]  AMITIZA 24 MCG capsule Take 24 mcg by mouth 2 (two) times daily with a meal.  12/15/18  Yes [provider]  clopidogrel (PLAVIX) 75 MG tablet Take 1 tablet (75 mg total) by mouth daily. 02/19/17  Yes Hongalgi, Lenis Dickinson, MD  diphenhydrAMINE (BENADRYL) 25 MG tablet Take 2 tablets (50 mg total) by mouth at bedtime as needed for sleep. 09/11/15  Yes Evelina Bucy, MD  fluticasone (FLONASE) 50 MCG/ACT nasal spray Place 1 spray into both nostrils daily.  09/10/15  Yes [provider]  furosemide (LASIX) 20 MG tablet Take 20 mg by mouth daily as needed for fluid or edema.    Yes [provider]  gabapentin (NEURONTIN) 300 MG capsule Take 300 mg by mouth 3 (three) times daily.    Yes [provider]  ibuprofen (ADVIL,MOTRIN) 800 MG tablet Take 800 mg by mouth every 8 (eight) hours as needed for headache or mild pain.   Yes [provider]    linaclotide (LINZESS) 290 MCG CAPS capsule Take 1-2 capsules (290-580 mcg total) by mouth daily as needed (constipation). 10/25/18  Yes Regalado, Belkys A, MD  loratadine (CLARITIN) 10 MG tablet Take 10 mg by mouth daily.   Yes [provider]  mometasone (ELOCON) 0.1 % ointment Apply 1 application topically daily as needed (irritation).    Yes [provider]  omeprazole (PRILOSEC) 20 MG capsule Take 20 mg by mouth 2 (two) times daily before a meal.   Yes [provider]  Oxycodone HCl 20 MG TABS Take 1 tablet by mouth 4 (four) times daily as needed (pain).  02/03/18  Yes [provider]  potassium chloride 20 MEQ/15ML (10%) SOLN Take 30 mLs by mouth daily. 10/07/18  Yes [provider]  sucralfate (CARAFATE) 1 g tablet Dissolve 1 tablet in 10 mL H20  and swallow 20 min prior to meals and bedtime. 12/28/18  Yes Eppie Gibson, MD  tiZANidine (ZANAFLEX) 4 MG tablet Take 4 mg by mouth 2 (two) times daily.  11/19/18  Yes [provider]  guaiFENesin-dextromethorphan (ROBITUSSIN DM) 100-10 MG/5ML syrup Take 5 mLs by mouth every 4 (four) hours as needed for cough. Patient not taking: Reported on 01/03/2019 10/25/18   Regalado, Jerald Kief A, MD  lactulose (CHRONULAC) 10 GM/15ML solution TAKE 30 MLS BY MOUTH DAILY AS NEEDED FOR CONSTIPATION Patient not taking: Reported on 01/26/2019 11/26/18   Curt Bears, MD  lidocaine (XYLOCAINE) 2 % solution Caregiver: Mix 1part 2% viscous lidocaine, 1part H20. Swish & swallow 44mL of diluted mixture, 75min before meals and at bedtime, up to QID Patient not taking: Reported on 01/07/2019 02/12/18   Eppie Gibson, MD  lidocaine-prilocaine (EMLA) cream Apply 1 application topically as needed. Patient not taking: Reported on 01/08/2019 09/22/18   Curt Bears, MD  nystatin (MYCOSTATIN) 100000 UNIT/ML suspension Take 5 mLs (500,000 Units total) by mouth 4 (four) times daily. Patient not taking: Reported on 01/02/2019 10/25/18    Regalado, Jerald Kief A, MD  polyethylene glycol (MIRALAX / GLYCOLAX) packet Take 17 g by mouth 2 (two) times daily. Patient not taking: Reported on 01/17/2019 10/25/18   Regalado, Jerald Kief A, MD  senna-docusate (SENOKOT-S) 8.6-50 MG tablet Take 1 tablet by mouth 2 (two) times daily. Patient not taking: Reported on 01/04/2019 10/25/18   Regalado, Jerald Kief A, MD  sucralfate (CARAFATE) 1 GM/10ML suspension Take 10 mLs (1 g total) by mouth 4 (four) times daily -  with meals and at bedtime. Take 20 minutes before meals. Patient not taking: Reported on 01/11/2019 12/28/18   Eppie Gibson, MD    Family History Family History  Problem Relation Age of Onset  . Cancer Mother 26       colon  . Diabetes Mother   . Hypertension Mother   . Cancer Father   . Diabetes Father   . Hypertension Father     Social History Social History   Tobacco Use  . Smoking status: Former Smoker    Packs/day: 1.00    Years: 48.00    Pack years: 48.00    Types: Cigarettes    Last attempt to quit: 01/28/2018    Years since quitting: 0.9  . Smokeless tobacco: Never Used  . Tobacco comment: Occasional.   Substance Use Topics  . Alcohol use: No    Frequency: Never  . Drug use: No    Comment: 15-20 yrs. ago- used marijuana      Allergies   Penicillins; Codeine; and Percocet [oxycodone-acetaminophen]   Review of Systems Review of Systems  Respiratory: Positive for shortness of breath.   Gastrointestinal: Positive for abdominal pain and nausea.  Neurological: Positive for weakness.  All other systems reviewed and are negative.    Physical Exam Updated Vital Signs BP (!) 104/55   Pulse 89   Temp 98.1 F (36.7 C) (Oral)   Resp (!) 22   SpO2 99%   Physical Exam Vitals signs and nursing note reviewed.  Constitutional:      Comments: Chronically ill   HENT:     Head: Normocephalic.     Right Ear: Tympanic membrane normal.     Left Ear: Tympanic membrane normal.     Mouth/Throat:     Mouth: Mucous  membranes are dry.  Eyes:     Extraocular Movements: Extraocular movements intact.     Pupils: Pupils are equal, round,  and reactive to light.  Neck:     Musculoskeletal: Normal range of motion.  Cardiovascular:     Rate and Rhythm: Normal rate and regular rhythm.  Pulmonary:     Comments: Crackles bilateral bases  Abdominal:     General: Abdomen is flat.     Palpations: Abdomen is soft.     Comments: Mild epigastric tenderness   Musculoskeletal: Normal range of motion.  Skin:    General: Skin is warm.     Capillary Refill: Capillary refill takes less than 2 seconds.  Neurological:     General: No focal deficit present.     Mental Status: She is oriented to person, place, and time.  Psychiatric:        Mood and Affect: Mood normal.        Behavior: Behavior normal.      ED Treatments / Results  Labs (all labs ordered are listed, but only abnormal results are displayed) Labs Reviewed  BASIC METABOLIC PANEL - Abnormal; Notable for the following components:      Result Value   Sodium 132 (*)    BUN 30 (*)    Calcium 10.8 (*)    All other components within normal limits  CBC - Abnormal; Notable for the following components:   WBC 11.0 (*)    RBC 3.34 (*)    Hemoglobin 9.7 (*)    HCT 28.1 (*)    All other components within normal limits  HEPATIC FUNCTION PANEL - Abnormal; Notable for the following components:   Albumin 2.8 (*)    AST 53 (*)    Total Bilirubin 3.8 (*)    Bilirubin, Direct 2.0 (*)    Indirect Bilirubin 1.8 (*)    All other components within normal limits  CULTURE, BLOOD (ROUTINE X 2)  CULTURE, BLOOD (ROUTINE X 2)  LACTIC ACID, PLASMA  LIPASE, BLOOD  TROPONIN I  URINALYSIS, ROUTINE W REFLEX MICROSCOPIC  CBG MONITORING, ED  I-STAT TROPONIN, ED  I-STAT TROPONIN, ED    EKG None  Radiology Dg Chest 2 View  Result Date: 01/14/2019 CLINICAL DATA:  Weakness and mental status change x1 day. Chest pain x2 days radiating to the upper abdomen and  along right side of ribs. EXAM: CHEST - 2 VIEW COMPARISON:  CT 12/14/2018 FINDINGS: Obscuration of the right hemidiaphragm and right heart border from known right pleural effusion and presumed pulmonary mass though this is obscured by the effusion. No significant change is identified. Mild central vascular congestion is seen. Left lung is relatively clear with port catheter in place from left IJ approach. The tip terminates in the distal SVC. No aggressive osseous lesions. No pneumothorax. IMPRESSION: Obscuration of the right hemidiaphragm and right heart border from known right small to moderate pleural effusion and presumed pulmonary mass. No significant change from prior. Electronically Signed   By: Ashley Royalty M.D.   On: 01/27/2019 15:26   Ct Angio Chest Pe W And/or Wo Contrast  Result Date: 01/07/2019 CLINICAL DATA:  Lung cancer, weakness and mental status changes for 1 day, nausea, vomiting, chest pain for 2 days radiating from mid chest upper abdomen and down RIGHT side of ribs, high pretest probability for pulmonary embolism, additional past history of chronic bronchitis, GERD, hypertension, former smoker EXAM: CT ANGIOGRAPHY CHEST CT ABDOMEN AND PELVIS WITH CONTRAST TECHNIQUE: Multidetector CT imaging of the chest was performed using the standard protocol during bolus administration of intravenous contrast. Multiplanar CT image reconstructions and MIPs were obtained to evaluate  the vascular anatomy. Multidetector CT imaging of the abdomen and pelvis was performed using the standard protocol during bolus administration of intravenous contrast. CONTRAST:  43mL ISOVUE-370 IOPAMIDOL (ISOVUE-370) INJECTION 76% IV. No oral contrast. COMPARISON:  CT chest abdomen and pelvis 12/14/2018 FINDINGS: CTA CHEST FINDINGS Cardiovascular: Atherosclerotic calcifications aorta, proximal great vessels and minimally in coronary arteries. Upper and origin of RIGHT subclavian artery arising last from aortic arch and coursing  posterior to esophagus. Aorta normal caliber without aneurysm or dissection. Heart normal size. No pericardial effusion. Pulmonary arteries adequately opacified and patent. No evidence of pulmonary embolism. LEFT jugular Port-A-Cath with tip in SVC near cavoatrial junction. Mediastinum/Nodes: No definite esophageal abnormalities. Base of cervical region normal appearance. Enlarged precarinal lymph node 15 mm short axis image 34. Lungs/Pleura: Large necrotic tumor mass again identified involving the RIGHT hilum, mediastinum, and RIGHT infrahilar region into medial RIGHT lower lobe, approximately 6.0 x 3.9 cm image 42. Central gas/cavitation increased since previous exam. Mass abuts the descending thoracic aorta, RIGHT pulmonary artery, descending lobar intra pulmonary artery, and RIGHT mainstem bronchus, with occlusion of the bronchus intermedius. Complete opacification of the RIGHT middle lobe, with significant atelectasis and consolidation of the basilar segments of the RIGHT lower lobe. Infiltrates are present in the superior segment of the RIGHT lower lobe, could represent postobstructive pneumonitis or lymphangitic tumor spread. Partially loculated moderate RIGHT pleural effusion with compressive atelectasis of the posterior RIGHT upper lobe. Minimal dependent atelectasis in LEFT lower lobe. LEFT upper lobe nodule 7 mm diameter image 52 not significantly changed. Musculoskeletal: No osseous metastatic lesions. Review of the MIP images confirms the above findings. CT ABDOMEN and PELVIS FINDINGS Hepatobiliary: Small cyst RIGHT lobe liver 13 x 10 mm unchanged. Mild intrahepatic biliary dilatation question physiologic post cholecystectomy, unchanged. Pancreas: Normal appearance Spleen: Normal appearance Adrenals/Urinary Tract: Adrenal glands normal appearance. Cyst at inferior pole RIGHT kidney anteriorly 2.3 x 2.2 cm image 41. Kidneys, ureters, and bladder otherwise normal appearance. Stomach/Bowel: Increased stool  in rectum. Appendix surgically absent by history. Gastric wall appears prominent though this may be an artifact related to underdistention. Bowel loops otherwise normal appearance. Vascular/Lymphatic: Atherosclerotic calcifications aorta and iliac arteries. Aorta normal caliber. No adenopathy. Scattered pelvic phleboliths. Reproductive: Uterus surgically absent nonvisualization of ovaries. Other: No free air or free fluid.  No hernia. Musculoskeletal: Osseous structures demineralized without evidence of osseous metastasis. Review of the MIP images confirms the above findings. IMPRESSION: No evidence of pulmonary embolism. 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 Increased atelectasis and consolidation of the RIGHT lower lobe and RIGHT middle lobe with additional infiltrate in the superior segment of the RIGHT lower lobe which could represent postobstructive pneumonitis or lymphangitic tumor spread. Increased RIGHT pleural effusion, at least partially loculated. Increased stool in rectum. No other acute intra-abdominal or intrapelvic abnormalities. Electronically Signed   By: Lavonia Dana M.D.   On: 01/09/2019 18:46   Ct Abdomen Pelvis W Contrast  Result Date: 01/11/2019 CLINICAL DATA:  Lung cancer, weakness and mental status changes for 1 day, nausea, vomiting, chest pain for 2 days radiating from mid chest upper abdomen and down RIGHT side of ribs, high pretest probability for pulmonary embolism, additional past history of chronic bronchitis, GERD, hypertension, former smoker EXAM: CT ANGIOGRAPHY CHEST CT ABDOMEN AND PELVIS WITH CONTRAST TECHNIQUE: Multidetector CT imaging of the chest was performed using the standard protocol during bolus administration of  intravenous contrast. Multiplanar CT image reconstructions and MIPs were obtained to evaluate the vascular anatomy.  Multidetector CT imaging of the abdomen and pelvis was performed using the standard protocol during bolus administration of intravenous contrast. CONTRAST:  92mL ISOVUE-370 IOPAMIDOL (ISOVUE-370) INJECTION 76% IV. No oral contrast. COMPARISON:  CT chest abdomen and pelvis 12/14/2018 FINDINGS: CTA CHEST FINDINGS Cardiovascular: Atherosclerotic calcifications aorta, proximal great vessels and minimally in coronary arteries. Upper and origin of RIGHT subclavian artery arising last from aortic arch and coursing posterior to esophagus. Aorta normal caliber without aneurysm or dissection. Heart normal size. No pericardial effusion. Pulmonary arteries adequately opacified and patent. No evidence of pulmonary embolism. LEFT jugular Port-A-Cath with tip in SVC near cavoatrial junction. Mediastinum/Nodes: No definite esophageal abnormalities. Base of cervical region normal appearance. Enlarged precarinal lymph node 15 mm short axis image 34. Lungs/Pleura: Large necrotic tumor mass again identified involving the RIGHT hilum, mediastinum, and RIGHT infrahilar region into medial RIGHT lower lobe, approximately 6.0 x 3.9 cm image 42. Central gas/cavitation increased since previous exam. Mass abuts the descending thoracic aorta, RIGHT pulmonary artery, descending lobar intra pulmonary artery, and RIGHT mainstem bronchus, with occlusion of the bronchus intermedius. Complete opacification of the RIGHT middle lobe, with significant atelectasis and consolidation of the basilar segments of the RIGHT lower lobe. Infiltrates are present in the superior segment of the RIGHT lower lobe, could represent postobstructive pneumonitis or lymphangitic tumor spread. Partially loculated moderate RIGHT pleural effusion with compressive atelectasis of the posterior RIGHT upper lobe. Minimal dependent atelectasis in LEFT lower lobe. LEFT upper lobe nodule 7 mm diameter image 52 not significantly changed. Musculoskeletal: No osseous metastatic  lesions. Review of the MIP images confirms the above findings. CT ABDOMEN and PELVIS FINDINGS Hepatobiliary: Small cyst RIGHT lobe liver 13 x 10 mm unchanged. Mild intrahepatic biliary dilatation question physiologic post cholecystectomy, unchanged. Pancreas: Normal appearance Spleen: Normal appearance Adrenals/Urinary Tract: Adrenal glands normal appearance. Cyst at inferior pole RIGHT kidney anteriorly 2.3 x 2.2 cm image 41. Kidneys, ureters, and bladder otherwise normal appearance. Stomach/Bowel: Increased stool in rectum. Appendix surgically absent by history. Gastric wall appears prominent though this may be an artifact related to underdistention. Bowel loops otherwise normal appearance. Vascular/Lymphatic: Atherosclerotic calcifications aorta and iliac arteries. Aorta normal caliber. No adenopathy. Scattered pelvic phleboliths. Reproductive: Uterus surgically absent nonvisualization of ovaries. Other: No free air or free fluid.  No hernia. Musculoskeletal: Osseous structures demineralized without evidence of osseous metastasis. Review of the MIP images confirms the above findings. IMPRESSION: No evidence of pulmonary embolism. 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 Increased atelectasis and consolidation of the RIGHT lower lobe and RIGHT middle lobe with additional infiltrate in the superior segment of the RIGHT lower lobe which could represent postobstructive pneumonitis or lymphangitic tumor spread. Increased RIGHT pleural effusion, at least partially loculated. Increased stool in rectum. No other acute intra-abdominal or intrapelvic abnormalities. Electronically Signed   By: Lavonia Dana M.D.   On: 01/04/2019 18:46    Procedures Procedures (including critical care time)  CRITICAL CARE Performed by: Wandra Arthurs   Total critical care time: 40 minutes  Critical  care time was exclusive of separately billable procedures and treating other patients.  Critical care was necessary to treat or prevent imminent or life-threatening deterioration.  Critical care was time spent personally by me on the following activities: development of treatment plan with patient and/or surrogate  as well as nursing, discussions with consultants, evaluation of patient's response to treatment, examination of patient, obtaining history from patient or surrogate, ordering and performing treatments and interventions, ordering and review of laboratory studies, ordering and review of radiographic studies, pulse oximetry and re-evaluation of patient's condition.   Medications Ordered in ED Medications  vancomycin (VANCOCIN) IVPB 1000 mg/200 mL premix (has no administration in time range)  ceFEPIme (MAXIPIME) 2 g in sodium chloride 0.9 % 100 mL IVPB (has no administration in time range)  sodium chloride flush (NS) 0.9 % injection 3 mL (3 mLs Intravenous Given 01/10/2019 1446)  sodium chloride 0.9 % bolus 1,000 mL (0 mLs Intravenous Stopped 01/02/2019 1610)  iopamidol (ISOVUE-370) 76 % injection 100 mL (80 mLs Intravenous Contrast Given 01/08/2019 1731)     Initial Impression / Assessment and Plan / ED Course  I have reviewed the triage vital signs and the nursing notes.  Pertinent labs & imaging results that were available during my care of the patient were reviewed by me and considered in my medical decision making (see chart for details).    Datha A [name] is a 68 y.o. female here with SOB, chest pain. Patient hypotensive in the ED. Likely sepsis from pneumonia vs PE. Will get labs, lactate, cultures, CXR, CTA chest. Will likely need admission.   7:06 PM CTA showed post obstructive pneumonia. Also increased mass. I called Dr. Walden Field from oncology, who will let Dr. Inda Merlin know. Will give vanc/cefepime. Will admit for pneumonia.    Final Clinical Impressions(s) / ED Diagnoses   Final  diagnoses:  None    ED Discharge Orders    None       Drenda Freeze, MD 01/21/2019 1911    Drenda Freeze, MD 01/31/19 1130

## 2019-01-12 NOTE — ED Notes (Signed)
Both sets of cultures, blue top, and gold tops sent to lab waiting orders.

## 2019-01-12 NOTE — Progress Notes (Addendum)
Pharmacy Antibiotic Note  Faith [name] is a 68 y.o. female admitted on 01/15/2019 with pneumonia.  PMH significant for metastatic lung cancer (undergoing radiation and chemo).  Pharmacy has been consulted for Vancomycin dosing.  Plan:  Vancomycin 1gm IV x 1 then  750 mg IV Q 24 hrs. Goal AUC 400-550.  Expected AUC: 422.2  SCr used: 0.8  Cefepime per MD  Follow renal function  Follow culture results and sensitivities    Temp (24hrs), Avg:98.4 F (36.9 C), Min:98.1 F (36.7 C), Max:98.6 F (37 C)  Recent Labs  Lab 01/08/2019 1441 01/16/2019 1442  WBC 11.0*  --   CREATININE 0.75  --   LATICACIDVEN  --  1.3    Estimated Creatinine Clearance: 52.2 mL/min (by C-G formula based on SCr of 0.75 mg/dL).    Allergies  Allergen Reactions  . Penicillins Itching and Other (See Comments)    Causes yeast infections  PATIENT HAS HAD A PCN REACTION WITH IMMEDIATE RASH, FACIAL/TONGUE/THROAT SWELLING, SOB, OR LIGHTHEADEDNESS WITH HYPOTENSION:  #  #  #  YES  #  #  #   Has patient had a PCN reaction causing severe rash involving mucus membranes or skin necrosis: Unk Has patient had a PCN reaction that required hospitalization: No Has patient had a PCN reaction occurring within the last 10 years: #  #  #  YES  #  #  #  If all of the above answers are "NO", then may proceed with Cephalosporin   . Codeine Itching and Other (See Comments)    And yeast infections  . Percocet [Oxycodone-Acetaminophen] Itching    Tolerates with Benadryl    Antimicrobials this admission: 2/12 Vanc >>   2/12 Cefepime >>    Dose adjustments this admission:    Microbiology results: 2/12 BCx: sent 2/12 Sputum: sent   Thank you for allowing pharmacy to be a part of this patient's care.  Everette Rank, PharmD 01/11/2019 8:06 PM

## 2019-01-13 ENCOUNTER — Ambulatory Visit
Admission: RE | Admit: 2019-01-13 | Discharge: 2019-01-13 | Disposition: A | Discharge status: Home / Self Care | Payer: Medicare Other | Source: Ambulatory Visit | Attending: Radiation Oncology | Admitting: Radiation Oncology

## 2019-01-13 DIAGNOSIS — C770 Secondary and unspecified malignant neoplasm of lymph nodes of head, face and neck: Secondary | ICD-10-CM

## 2019-01-13 DIAGNOSIS — I10 Essential (primary) hypertension: Secondary | ICD-10-CM

## 2019-01-13 DIAGNOSIS — J189 Pneumonia, unspecified organism: Secondary | ICD-10-CM

## 2019-01-13 DIAGNOSIS — C3491 Malignant neoplasm of unspecified part of right bronchus or lung: Secondary | ICD-10-CM

## 2019-01-13 LAB — RESPIRATORY PANEL BY PCR
Adenovirus: NOT DETECTED
Bordetella pertussis: NOT DETECTED
CORONAVIRUS 229E-RVPPCR: NOT DETECTED
CORONAVIRUS OC43-RVPPCR: NOT DETECTED
Chlamydophila pneumoniae: NOT DETECTED
Coronavirus HKU1: NOT DETECTED
Coronavirus NL63: NOT DETECTED
Influenza A: NOT DETECTED
Influenza B: NOT DETECTED
METAPNEUMOVIRUS-RVPPCR: NOT DETECTED
Mycoplasma pneumoniae: NOT DETECTED
PARAINFLUENZA VIRUS 1-RVPPCR: NOT DETECTED
PARAINFLUENZA VIRUS 2-RVPPCR: NOT DETECTED
Parainfluenza Virus 3: NOT DETECTED
Parainfluenza Virus 4: NOT DETECTED
Respiratory Syncytial Virus: NOT DETECTED
Rhinovirus / Enterovirus: NOT DETECTED

## 2019-01-13 LAB — CBC WITH DIFFERENTIAL/PLATELET
Abs Immature Granulocytes: 0.08 10*3/uL — ABNORMAL HIGH (ref 0.00–0.07)
Basophils Absolute: 0 10*3/uL (ref 0.0–0.1)
Basophils Relative: 0 %
Eosinophils Absolute: 0 10*3/uL (ref 0.0–0.5)
Eosinophils Relative: 0 %
HCT: 24.3 % — ABNORMAL LOW (ref 36.0–46.0)
Hemoglobin: 8.5 g/dL — ABNORMAL LOW (ref 12.0–15.0)
Immature Granulocytes: 1 %
Lymphocytes Relative: 9 %
Lymphs Abs: 0.8 10*3/uL (ref 0.7–4.0)
MCH: 29.2 pg (ref 26.0–34.0)
MCHC: 35 g/dL (ref 30.0–36.0)
MCV: 83.5 fL (ref 80.0–100.0)
MONOS PCT: 12 %
Monocytes Absolute: 1.1 10*3/uL — ABNORMAL HIGH (ref 0.1–1.0)
NEUTROS PCT: 78 %
Neutro Abs: 7.4 10*3/uL (ref 1.7–7.7)
Platelets: 198 10*3/uL (ref 150–400)
RBC: 2.91 MIL/uL — ABNORMAL LOW (ref 3.87–5.11)
RDW: 13.9 % (ref 11.5–15.5)
WBC: 9.4 10*3/uL (ref 4.0–10.5)
nRBC: 0 % (ref 0.0–0.2)

## 2019-01-13 LAB — COMPREHENSIVE METABOLIC PANEL
ALK PHOS: 110 U/L (ref 38–126)
ALT: 27 U/L (ref 0–44)
ANION GAP: 4 — AB (ref 5–15)
AST: 50 U/L — ABNORMAL HIGH (ref 15–41)
Albumin: 2.6 g/dL — ABNORMAL LOW (ref 3.5–5.0)
BUN: 24 mg/dL — ABNORMAL HIGH (ref 8–23)
CO2: 23 mmol/L (ref 22–32)
Calcium: 10.2 mg/dL (ref 8.9–10.3)
Chloride: 110 mmol/L (ref 98–111)
Creatinine, Ser: 0.56 mg/dL (ref 0.44–1.00)
GFR calc non Af Amer: >60 mL/min (ref >60)
Glucose, Bld: 94 mg/dL (ref 70–99)
Potassium: 3.4 mmol/L — ABNORMAL LOW (ref 3.5–5.1)
SODIUM: 137 mmol/L (ref 135–145)
Total Bilirubin: 3.6 mg/dL — ABNORMAL HIGH (ref 0.3–1.2)
Total Protein: 6.1 g/dL — ABNORMAL LOW (ref 6.5–8.1)

## 2019-01-13 LAB — MRSA PCR SCREENING: MRSA by PCR: NEGATIVE

## 2019-01-13 MED ORDER — BISACODYL 5 MG PO TBEC
10.0000 mg | DELAYED_RELEASE_TABLET | Freq: Every day | ORAL | Status: DC
  Administered 2019-01-13 – 2019-01-20 (×8): 10 mg via ORAL
  Filled 2019-01-13 (×8): qty 2

## 2019-01-13 MED ORDER — GUAIFENESIN-DM 100-10 MG/5ML PO SYRP
5.0000 mL | ORAL_SOLUTION | ORAL | Status: DC | PRN
  Administered 2019-01-13 – 2019-01-15 (×7): 5 mL via ORAL
  Filled 2019-01-13 (×7): qty 10

## 2019-01-13 MED ORDER — SENNOSIDES-DOCUSATE SODIUM 8.6-50 MG PO TABS
3.0000 | ORAL_TABLET | Freq: Every day | ORAL | Status: DC
  Administered 2019-01-13 – 2019-01-19 (×6): 3 via ORAL
  Filled 2019-01-13 (×8): qty 3

## 2019-01-13 NOTE — Progress Notes (Signed)
PROGRESS NOTE    Faith [name]  AJO:[id number] DOB: 26-Jul-1951 DOA: 01/27/2019 PCP: [name], MD   Brief Narrative: 68 y.o. female with medical history significant of stage IV lung cancer diagnosed a year ago undergoing chemotherapy and radiation, chronic abdominal pain and lower back pain, bipolar disorder comes in with a couple of days of coughing that has been productive and nonbloody with associated subjective fevers and chills at home.  Patient also reports shortness of breath.  She was getting radiation today when they noticed her oxygen sats were a little low so they sent her to the emergency department for further evaluation for PE.  Patient denies any lower extremity swelling or edema.  She denies chest pain but does endorse right upper quadrant abdominal pain which is been present since her cancer is been found with mets.  Patient found to have postobstructive pneumonia and referred for admission for such.  She was hospitalized November but has not had recent a biotics.  She is being covered with cefepime and vancomycin.   Assessment & Plan:   Principal Problem:   PNA (pneumonia) Active Problems:   Right sided abdominal pain   Essential hypertension   Non-small cell carcinoma of right lung, stage 4 (HCC)   Chronic pain  #1 stage IV lung cancer with mediastinal and hilar node involvement ongoing chemotherapy and radiation-CT of the chest shows large necrotic neoplasm at the inferior right pulmonary hilum invading the mediastinum 6 x 3.9 cm with increased central necrosis and new cavitation since prior study.  Increased atelectasis and consolidation of the right lower lobe and right middle lobe with additional infiltrate in the superior segment of the right lower lobe which could represent postobstructive pneumonitis or lymphocytic tumor spread.  inCreased right pleural effusion at least partially loculated.  Continue with vancomycin and cefepime follow-up blood and sputum culture.   Influenza panel negative respiratory virus panel pending.  Patient is on room air.  Active Problems:   Non-small cell carcinoma of right lung, stage 4 (HCC)-hematology called and will see patient in the morning.    Right sided abdominal pain-continue her as needed oxycodone and adjust as needed.    Essential hypertension-patient only on Lasix as needed for edema at home and not on any other blood pressure medications.  Would not resume any at this time secondary to soft blood pressures.    Chronic pain-continue chronic home meds  Constipation start stool softeners with chronic narcotics.       Estimated body mass index is 21.54 kg/m as calculated from the following:   Height as of 01/03/19: 5\' 1"  (1.549 m).   Weight as of an earlier encounter on 01/25/2019: 51.7 kg.  DVT prophylaxis: SCD Code Status: Full code no family Family Communication: No family available Disposition Plan: Pending clinical improvement  Consultants:   Oncology  Procedures: Antimicrobials:   Subjective: Awake alert sitting in her bed on room air saturation  Objective: Patient constantly coughing Vitals:   01/16/2019 2120 01/13/19 0534 01/13/19 0730 01/13/19 1001  BP: (!) 151/51 (!) 93/54 (!) 102/53 (!) 123/59  Pulse: 89 74 81 90  Resp: (!) 23 (!) 21 16 16   Temp: 98.7 F (37.1 C) 98.4 F (36.9 C) 98.2 F (36.8 C) 98.2 F (36.8 C)  TempSrc: Oral Oral Oral Oral  SpO2: 98% 92% 96% 96%    Intake/Output Summary (Last 24 hours) at 01/13/2019 1024 Last data filed at 01/13/2019 0600 Gross per 24 hour  Intake 1345.49 ml  Output -  Net 1345.49 ml   There were no vitals filed for this visit.  Examination:  General exam: Appears calm and comfortable  Respiratory system: Scattered rhonchi with decreased breath sounds at the bases to auscultation. Respiratory effort normal. Cardiovascular system: S1 & S2 heard, RRR. No JVD, murmurs, rubs, gallops or clicks. No pedal edema. Gastrointestinal system:  Abdomen is nondistended, soft and nontender. No organomegaly or masses felt. Normal bowel sounds heard. Central nervous system: Alert and oriented. No focal neurological deficits. Extremities: Symmetric 5 x 5 power. Skin: No rashes, lesions or ulcers Psychiatry: Judgement and insight appear normal. Mood & affect appropriate.     Data Reviewed: I have personally reviewed following labs and imaging studies  CBC: Recent Labs  Lab 01/24/2019 1441 01/13/19 0540  WBC 11.0* 9.4  NEUTROABS  --  7.4  HGB 9.7* 8.5*  HCT 28.1* 24.3*  MCV 84.1 83.5  PLT 206 299   Basic Metabolic Panel: Recent Labs  Lab 01/08/2019 1441 01/13/19 0540  NA 132* 137  K 4.0 3.4*  CL 102 110  CO2 22 23  GLUCOSE 94 94  BUN 30* 24*  CREATININE 0.75 0.56  CALCIUM 10.8* 10.2   GFR: Estimated Creatinine Clearance: 52.2 mL/min (by C-G formula based on SCr of 0.56 mg/dL). Liver Function Tests: Recent Labs  Lab 01/11/2019 1441 01/13/19 0540  AST 53* 50*  ALT 23 27  ALKPHOS 95 110  BILITOT 3.8* 3.6*  PROT 6.8 6.1*  ALBUMIN 2.8* 2.6*   Recent Labs  Lab 01/28/2019 1441  LIPASE 19   No results for input(s): AMMONIA in the last 168 hours. Coagulation Profile: No results for input(s): INR, PROTIME in the last 168 hours. Cardiac Enzymes: Recent Labs  Lab 01/19/2019 1441  TROPONINI <0.03   BNP (last 3 results) No results for input(s): PROBNP in the last 8760 hours. HbA1C: No results for input(s): HGBA1C in the last 72 hours. CBG: Recent Labs  Lab 01/25/2019 1523  GLUCAP 90   Lipid Profile: No results for input(s): CHOL, HDL, LDLCALC, TRIG, CHOLHDL, LDLDIRECT in the last 72 hours. Thyroid Function Tests: No results for input(s): TSH, T4TOTAL, FREET4, T3FREE, THYROIDAB in the last 72 hours. Anemia Panel: No results for input(s): VITAMINB12, FOLATE, FERRITIN, TIBC, IRON, RETICCTPCT in the last 72 hours. Sepsis Labs: Recent Labs  Lab 01/05/2019 1442  LATICACIDVEN 1.3    Recent Results (from the past  240 hour(s))  Blood culture (routine x 2)     Status: None (Preliminary result)   Collection Time: 01/02/2019  2:38 PM  Result Value Ref Range Status   Specimen Description   Final    BLOOD LEFT CHEST Performed at [facility] [address], [place], [place] [address]    Special Requests   Final    BOTTLES DRAWN AEROBIC AND ANAEROBIC Blood Culture adequate volume Performed at [facility] [address], [place], [place] [address]    Culture   Final    NO GROWTH < 12 HOURS Performed at [facility] [address] [place], [place] [address]    Report Status PENDING  Incomplete  Blood culture (routine x 2)     Status: None (Preliminary result)   Collection Time: 01/08/2019  3:28 PM  Result Value Ref Range Status   Specimen Description   Final    BLOOD RIGHT FOREARM Performed at [facility] [address] [place], [place] [address]    Special Requests   Final  BOTTLES DRAWN AEROBIC AND ANAEROBIC Blood Culture adequate volume Performed at [facility] [address] [place], [place] [address]    Culture   Final    NO GROWTH < 12 HOURS Performed at [facility] [address] [place], [place] [address]    Report Status PENDING  Incomplete         Radiology Studies: Dg Chest 2 View  Result Date: 01/09/2019 CLINICAL DATA:  Weakness and mental status change x1 day. Chest pain x2 days radiating to the upper abdomen and along right side of ribs. EXAM: CHEST - 2 VIEW COMPARISON:  CT 12/14/2018 FINDINGS: Obscuration of the right hemidiaphragm and right heart border from known right pleural effusion and presumed pulmonary mass though this is obscured by the effusion. No significant change is identified. Mild central vascular congestion is seen. Left lung is relatively clear with port catheter in place from left IJ approach. The tip terminates in the distal SVC. No aggressive osseous lesions.  No pneumothorax. IMPRESSION: Obscuration of the right hemidiaphragm and right heart border from known right small to moderate pleural effusion and presumed pulmonary mass. No significant change from prior. Electronically Signed   By: Ashley Royalty M.D.   On: 01/09/2019 15:26   Ct Angio Chest Pe W And/or Wo Contrast  Result Date: 01/20/2019 CLINICAL DATA:  Lung cancer, weakness and mental status changes for 1 day, nausea, vomiting, chest pain for 2 days radiating from mid chest upper abdomen and down RIGHT side of ribs, high pretest probability for pulmonary embolism, additional past history of chronic bronchitis, GERD, hypertension, former smoker EXAM: CT ANGIOGRAPHY CHEST CT ABDOMEN AND PELVIS WITH CONTRAST TECHNIQUE: Multidetector CT imaging of the chest was performed using the standard protocol during bolus administration of intravenous contrast. Multiplanar CT image reconstructions and MIPs were obtained to evaluate the vascular anatomy. Multidetector CT imaging of the abdomen and pelvis was performed using the standard protocol during bolus administration of intravenous contrast. CONTRAST:  12mL ISOVUE-370 IOPAMIDOL (ISOVUE-370) INJECTION 76% IV. No oral contrast. COMPARISON:  CT chest abdomen and pelvis 12/14/2018 FINDINGS: CTA CHEST FINDINGS Cardiovascular: Atherosclerotic calcifications aorta, proximal great vessels and minimally in coronary arteries. Upper and origin of RIGHT subclavian artery arising last from aortic arch and coursing posterior to esophagus. Aorta normal caliber without aneurysm or dissection. Heart normal size. No pericardial effusion. Pulmonary arteries adequately opacified and patent. No evidence of pulmonary embolism. LEFT jugular Port-A-Cath with tip in SVC near cavoatrial junction. Mediastinum/Nodes: No definite esophageal abnormalities. Base of cervical region normal appearance. Enlarged precarinal lymph node 15 mm short axis image 34. Lungs/Pleura: Large necrotic tumor mass again  identified involving the RIGHT hilum, mediastinum, and RIGHT infrahilar region into medial RIGHT lower lobe, approximately 6.0 x 3.9 cm image 42. Central gas/cavitation increased since previous exam. Mass abuts the descending thoracic aorta, RIGHT pulmonary artery, descending lobar intra pulmonary artery, and RIGHT mainstem bronchus, with occlusion of the bronchus intermedius. Complete opacification of the RIGHT middle lobe, with significant atelectasis and consolidation of the basilar segments of the RIGHT lower lobe. Infiltrates are present in the superior segment of the RIGHT lower lobe, could represent postobstructive pneumonitis or lymphangitic tumor spread. Partially loculated moderate RIGHT pleural effusion with compressive atelectasis of the posterior RIGHT upper lobe. Minimal dependent atelectasis in LEFT lower lobe. LEFT upper lobe nodule 7 mm diameter image 52 not significantly changed. Musculoskeletal: No osseous metastatic lesions. Review of the MIP images confirms the above findings. CT ABDOMEN and PELVIS  FINDINGS Hepatobiliary: Small cyst RIGHT lobe liver 13 x 10 mm unchanged. Mild intrahepatic biliary dilatation question physiologic post cholecystectomy, unchanged. Pancreas: Normal appearance Spleen: Normal appearance Adrenals/Urinary Tract: Adrenal glands normal appearance. Cyst at inferior pole RIGHT kidney anteriorly 2.3 x 2.2 cm image 41. Kidneys, ureters, and bladder otherwise normal appearance. Stomach/Bowel: Increased stool in rectum. Appendix surgically absent by history. Gastric wall appears prominent though this may be an artifact related to underdistention. Bowel loops otherwise normal appearance. Vascular/Lymphatic: Atherosclerotic calcifications aorta and iliac arteries. Aorta normal caliber. No adenopathy. Scattered pelvic phleboliths. Reproductive: Uterus surgically absent nonvisualization of ovaries. Other: No free air or free fluid.  No hernia. Musculoskeletal: Osseous structures  demineralized without evidence of osseous metastasis. Review of the MIP images confirms the above findings. IMPRESSION: No evidence of pulmonary embolism. 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 Increased atelectasis and consolidation of the RIGHT lower lobe and RIGHT middle lobe with additional infiltrate in the superior segment of the RIGHT lower lobe which could represent postobstructive pneumonitis or lymphangitic tumor spread. Increased RIGHT pleural effusion, at least partially loculated. Increased stool in rectum. No other acute intra-abdominal or intrapelvic abnormalities. Electronically Signed   By: Lavonia Dana M.D.   On: 01/09/2019 18:46   Ct Abdomen Pelvis W Contrast  Result Date: 01/16/2019 CLINICAL DATA:  Lung cancer, weakness and mental status changes for 1 day, nausea, vomiting, chest pain for 2 days radiating from mid chest upper abdomen and down RIGHT side of ribs, high pretest probability for pulmonary embolism, additional past history of chronic bronchitis, GERD, hypertension, former smoker EXAM: CT ANGIOGRAPHY CHEST CT ABDOMEN AND PELVIS WITH CONTRAST TECHNIQUE: Multidetector CT imaging of the chest was performed using the standard protocol during bolus administration of intravenous contrast. Multiplanar CT image reconstructions and MIPs were obtained to evaluate the vascular anatomy. Multidetector CT imaging of the abdomen and pelvis was performed using the standard protocol during bolus administration of intravenous contrast. CONTRAST:  21mL ISOVUE-370 IOPAMIDOL (ISOVUE-370) INJECTION 76% IV. No oral contrast. COMPARISON:  CT chest abdomen and pelvis 12/14/2018 FINDINGS: CTA CHEST FINDINGS Cardiovascular: Atherosclerotic calcifications aorta, proximal great vessels and minimally in coronary arteries. Upper and origin of RIGHT subclavian artery arising  last from aortic arch and coursing posterior to esophagus. Aorta normal caliber without aneurysm or dissection. Heart normal size. No pericardial effusion. Pulmonary arteries adequately opacified and patent. No evidence of pulmonary embolism. LEFT jugular Port-A-Cath with tip in SVC near cavoatrial junction. Mediastinum/Nodes: No definite esophageal abnormalities. Base of cervical region normal appearance. Enlarged precarinal lymph node 15 mm short axis image 34. Lungs/Pleura: Large necrotic tumor mass again identified involving the RIGHT hilum, mediastinum, and RIGHT infrahilar region into medial RIGHT lower lobe, approximately 6.0 x 3.9 cm image 42. Central gas/cavitation increased since previous exam. Mass abuts the descending thoracic aorta, RIGHT pulmonary artery, descending lobar intra pulmonary artery, and RIGHT mainstem bronchus, with occlusion of the bronchus intermedius. Complete opacification of the RIGHT middle lobe, with significant atelectasis and consolidation of the basilar segments of the RIGHT lower lobe. Infiltrates are present in the superior segment of the RIGHT lower lobe, could represent postobstructive pneumonitis or lymphangitic tumor spread. Partially loculated moderate RIGHT pleural effusion with compressive atelectasis of the posterior RIGHT upper lobe. Minimal dependent atelectasis in LEFT lower lobe. LEFT upper lobe nodule 7 mm diameter image 52 not significantly changed. Musculoskeletal: No osseous metastatic lesions. Review  of the MIP images confirms the above findings. CT ABDOMEN and PELVIS FINDINGS Hepatobiliary: Small cyst RIGHT lobe liver 13 x 10 mm unchanged. Mild intrahepatic biliary dilatation question physiologic post cholecystectomy, unchanged. Pancreas: Normal appearance Spleen: Normal appearance Adrenals/Urinary Tract: Adrenal glands normal appearance. Cyst at inferior pole RIGHT kidney anteriorly 2.3 x 2.2 cm image 41. Kidneys, ureters, and bladder otherwise normal  appearance. Stomach/Bowel: Increased stool in rectum. Appendix surgically absent by history. Gastric wall appears prominent though this may be an artifact related to underdistention. Bowel loops otherwise normal appearance. Vascular/Lymphatic: Atherosclerotic calcifications aorta and iliac arteries. Aorta normal caliber. No adenopathy. Scattered pelvic phleboliths. Reproductive: Uterus surgically absent nonvisualization of ovaries. Other: No free air or free fluid.  No hernia. Musculoskeletal: Osseous structures demineralized without evidence of osseous metastasis. Review of the MIP images confirms the above findings. IMPRESSION: No evidence of pulmonary embolism. 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 Increased atelectasis and consolidation of the RIGHT lower lobe and RIGHT middle lobe with additional infiltrate in the superior segment of the RIGHT lower lobe which could represent postobstructive pneumonitis or lymphangitic tumor spread. Increased RIGHT pleural effusion, at least partially loculated. Increased stool in rectum. No other acute intra-abdominal or intrapelvic abnormalities. Electronically Signed   By: Lavonia Dana M.D.   On: 01/08/2019 18:46        Scheduled Meds: . clopidogrel  75 mg Oral Daily  . feeding supplement (ENSURE ENLIVE)  237 mL Oral BID BM  . fluticasone  1 spray Each Nare Daily  . gabapentin  300 mg Oral TID  . loratadine  10 mg Oral Daily  . lubiprostone  24 mcg Oral BID WC  . potassium chloride  40 mEq Oral Daily  . sodium chloride flush  3 mL Intravenous Q12H  . tiZANidine  4 mg Oral BID   Continuous Infusions: . sodium chloride Stopped (01/13/19 0542)  . ceFEPime (MAXIPIME) IV 200 mL/hr at 01/13/19 0600  . vancomycin       LOS: 1 day     Georgette Shell, MD [facility]  If 7PM-7AM, please contact  night-coverage [email or web address] Password [facility] 01/13/2019, 10:24 AM

## 2019-01-13 NOTE — Evaluation (Signed)
Physical Therapy Evaluation Patient Details Name: Faith [name] MRN: [id number] DOB: 1951/05/16 Today's Date: 01/13/2019   History of Present Illness  68 yo female admitted to ED on 2/12 from the cancer center with chest pain, weakness, ShOB. Work up - for PE, imaging revealed obstruction of R hemidiaphragm and R heart border from known R small to moderate pleaural effusion and pulmonary mass (no change). Pt with medical diagnosis of post-obstructive PNAPt being treated with radiation for metastatic lung cancer with neck involvement. Other PMH includes depression, anxiety, bipolar disorder, chronic LBP, chronic bronchitis, dyspnea, GERD, HTN, PVD, schizophrenia, stroke with L eye blindness, femoral-popliteal bypass graft.    Clinical Impression   Pt presents with LE weakness, generalized deconditioning, difficulty performing transfers and ambulation, and decreased tolerance for activity due to dyspnea and fatigue. SpO2 WNL during session. Pt to benefit from acute PT to address deficits. Pt ambulated 1x15 ft, 2x10 ft during session with cane and/or HHA today, requiring occasional min assist for steadying. PT recommending SNF placement at this time due to deficits and lack of 24/7 care at home. PT to progress mobility as tolerated, and will continue to follow acutely.      Follow Up Recommendations SNF;Supervision/Assistance - 24 hour(vs HHPT, pending pt progress with mobility. )    Equipment Recommendations  None recommended by PT    Recommendations for Other Services       Precautions / Restrictions Precautions Precautions: Fall Precaution Comments: Pt placed on 2LO2 prior to PT arrival by RN, pt stating she feels winded but sats WNL during mobility.  Restrictions Weight Bearing Restrictions: No      Mobility  Bed Mobility Overal bed mobility: Needs Assistance Bed Mobility: Sit to Supine       Sit to supine: HOB elevated;Supervision   General bed mobility comments: Pt up in  chair upon arrival to room. Supervision assist for sit to supine, increased time and effort to bring LEs onto bed and difficulty with scooting self up in bed even with use of bed rails.   Transfers Overall transfer level: Needs assistance Equipment used: Straight cane Transfers: Sit to/from Stand Sit to Stand: Min assist         General transfer comment: sit to stand x2, once from recliner and once from toilet. Min assist for steadying and assist with sequencing. Upon standing, PT discovered pt soiled with urine.   Ambulation/Gait Ambulation/Gait assistance: Min guard;Min assist Gait Distance (Feet): 15 Feet(1x15 ft, 2 x10 ft ) Assistive device: Straight cane;1 person hand held assist   Gait velocity: decr    General Gait Details: 35 ft total ambulation during session. Pt ambulated with min guard to min assist for steadying as needed. Pt after first 15 ft required seated rest break due to fatigue, then assisted pt in walking 10 ft to and from bathroom. Pt with 1 LOB in bathroom, balance corrected with pt stepping strategy and PT steadying at gait belt. Pt with dyspnea 2/4 during ambulation, sats >96% on 2LO2.   Stairs            Wheelchair Mobility    Modified Rankin (Stroke Patients Only)       Balance Overall balance assessment: Needs assistance Sitting-balance support: No upper extremity supported Sitting balance-Leahy Scale: Good     Standing balance support: Single extremity supported Standing balance-Leahy Scale: Fair Standing balance comment: unable to accept challenge to gait, mild LOB noted during session without warning  Pertinent Vitals/Pain Pain Assessment: 0-10 Pain Score: 8  Pain Location: neck, along R side (radiation site per pt) Pain Descriptors / Indicators: Sore Pain Intervention(s): Limited activity within patient's tolerance;Repositioned;Monitored during session    Home Living Family/patient expects to  be discharged to:: Private residence Living Arrangements: Alone Available Help at Discharge: Family;Friend(s);Available PRN/intermittently(Pt has a niece and nephew that live closeby, and her children assist her as needed. Pt states "they help me too much sometimes") Type of Home: Apartment Home Access: Stairs to enter Entrance Stairs-Rails: Right Entrance Stairs-Number of Steps: 8 Home Layout: One level Home Equipment: Walker - 2 wheels;Cane - single point;Bedside commode Additional Comments: Pt states she does not like the BSC she has, and doesn't use it.    Prior Function Level of Independence: Needs assistance   Gait / Transfers Assistance Needed: uses SPC for mobility in the home and in the community, and reports occasionally using the RW "when I feel like I need it"  ADL's / Homemaking Assistance Needed: Pt states she is independent in all ADLs, previous rehab notes from ~1 year ago state pt needed assistance from family and neighbors   Comments: Pt reports she does not like receiving assist if possible      Hand Dominance   Dominant Hand: Left(Again, 1 year ago rehab documented pt is R handed but pt explicitly expresses to PT she is L handed. Cane used in the R hand. )    Extremity/Trunk Assessment   Upper Extremity Assessment Upper Extremity Assessment: RUE deficits/detail RUE Deficits / Details: difficulty with shoulder elevation >45*, elbow flexion and extension WNL  RUE: Unable to fully assess due to pain    Lower Extremity Assessment Lower Extremity Assessment: Generalized weakness(3/5 knee flexion and extension. 2/5 hip flexion, hip abduction, and hip adduction. )    Cervical / Trunk Assessment Cervical / Trunk Assessment: Normal  Communication   Communication: HOH  Cognition Arousal/Alertness: Awake/alert Behavior During Therapy: WFL for tasks assessed/performed Overall Cognitive Status: Impaired/Different from baseline Area of Impairment:  Attention;Memory;Following commands;Safety/judgement;Problem solving                   Current Attention Level: Selective Memory: Decreased short-term memory Following Commands: Follows one step commands consistently Safety/Judgement: Decreased awareness of deficits;Decreased awareness of safety   Problem Solving: Slow processing;Difficulty sequencing;Requires tactile cues General Comments: Pt unsteady and lacking awareness of safety. Pt with slow processing and requires increased time to respond to PT.       General Comments      Exercises     Assessment/Plan    PT Assessment Patient needs continued PT services  PT Problem List Decreased strength;Cardiopulmonary status limiting activity;Pain;Decreased activity tolerance;Decreased cognition;Decreased knowledge of use of DME;Decreased balance;Decreased safety awareness;Decreased mobility       PT Treatment Interventions DME instruction;Therapeutic activities;Gait training;Therapeutic exercise;Patient/family education;Balance training;Stair training;Functional mobility training    PT Goals (Current goals can be found in the Care Plan section)  Acute Rehab PT Goals Patient Stated Goal: go home  PT Goal Formulation: With patient Time For Goal Achievement: 01/27/19 Potential to Achieve Goals: Fair    Frequency Min 2X/week   Barriers to discharge        Co-evaluation               AM-PAC PT "6 Clicks" Mobility  Outcome Measure Help needed turning from your back to your side while in a flat bed without using bedrails?: A Little Help needed moving from lying on your back  to sitting on the side of a flat bed without using bedrails?: A Little Help needed moving to and from a bed to a chair (including a wheelchair)?: A Little Help needed standing up from a chair using your arms (e.g., wheelchair or bedside chair)?: A Little Help needed to walk in hospital room?: A Little Help needed climbing 3-5 steps with a  railing? : A Lot 6 Click Score: 17    End of Session Equipment Utilized During Treatment: Gait belt Activity Tolerance: Patient limited by fatigue Patient left: in bed;with bed alarm set;with call bell/phone within reach Nurse Communication: Mobility status;Other (comment)(Pt needs purewick replaced ) PT Visit Diagnosis: Other abnormalities of gait and mobility (R26.89);Muscle weakness (generalized) (M62.81);Unsteadiness on feet (R26.81)    Time: 1204-1229 PT Time Calculation (min) (ACUTE ONLY): 25 min   Charges:   PT Evaluation $PT Eval Low Complexity: 1 Low PT Treatments $Gait Training: 8-22 mins       Julien Girt, PT Acute Rehabilitation Services Pager [phone]  Office [phone]   Draxton Luu D Elonda Husky 01/13/2019, 2:45 PM

## 2019-01-13 NOTE — Progress Notes (Addendum)
Initial Nutrition Assessment  DOCUMENTATION CODES:   Non-severe (moderate) malnutrition in context of chronic illness  INTERVENTION:  Continue Ensure Enlive po TID, each supplement provides 350 kcal and 20 grams of protein Liberalize diet.  NUTRITION DIAGNOSIS:   Moderate Malnutrition related to chronic illness(Stage 4 Lung cancer) as evidenced by moderate muscle depletion, moderate fat depletion, severe muscle depletion.  GOAL:   Patient will meet greater than or equal to 90% of their needs  MONITOR:   PO intake, I & O's, Weight trends, Supplement acceptance  REASON FOR ASSESSMENT:   Malnutrition Screening Tool    ASSESSMENT:   68 year old female w/ PMH of GERD, HTN, cholelithiasis, anxiety, stroke, and stage IV lung cancer currently undergoing chemotherapy and radiation. Pt was sent from cancer center w/ weakness, mental status changes, and complaints of chest pains for 2 days.   Per physician note, CT of chest shows large necrotic neoplasm at the inferior right pulmonary hilum invading the mediastinum, w/ increased central necrosis and new cavitation since last study. Pt has had her second treatment of radiation.   Spoke with patient at bedside. Pt was slightly groggy and would drift off asleep while we were talking.  Pt reported that she drinks Ensure throughout the day at home and has a hard time eating here recently due to her gums being sore. She is missing some teeth but did not contribute that to eating less. Pt enjoys soft foods like green beans, mashed potatoes, and soft fruit. Her lunch tray arrived shortly before speaking with her, she did not eat any during our time together. She mentioned that she would probably only eat the greens; the hamburger would hurt her gums too much.   Pt denies any swallowing difficulties or taste changes. Pt was unclear about UBW. Has been facing some n/v the last few days. Pt reported abdominal and ankle pain. Per pt chart, her wt was  117.3# at last RD visit. Current documented wt is 113.7# which indicates a 3% wt loss which is not significant for the time frame. NFPE revealed moderate to severe muscle and fat depletion; patient meets criteria for malnutrition. Suspect severe malnutrition but unable to document with given information.   Recommend increasing Ensure to TID and continuing to monitor.  Medications reviewed and include: Dulcolax 10mg , Amitiza 2mcg 2x daily, Senokot-S 3 tablets Labs reviewed: K 3.4 (L)  NUTRITION - FOCUSED PHYSICAL EXAM:    Most Recent Value  Orbital Region  Moderate depletion  Upper Arm Region  Severe depletion  Thoracic and Lumbar Region  Unable to assess  Buccal Region  Moderate depletion  Temple Region  Moderate depletion  Clavicle Bone Region  Moderate depletion  Clavicle and Acromion Bone Region  Moderate depletion  Scapular Bone Region  Moderate depletion  Dorsal Hand  Severe depletion  Patellar Region  Severe depletion  Anterior Thigh Region  Severe depletion  Posterior Calf Region  Moderate depletion  Edema (RD Assessment)  Mild  Hair  Reviewed  Eyes  Reviewed  Mouth  Reviewed [pt missing teeth]  Skin  Reviewed  Nails  Reviewed       Diet Order:   Diet Order            Diet 2 gram sodium Room service appropriate? Yes; Fluid consistency: Thin  Diet effective now              EDUCATION NEEDS:   No education needs have been identified at this time  Skin:  Skin  Assessment: Reviewed RN Assessment  Last BM:  2/10  Height:   Ht Readings from Last 1 Encounters:  01/03/19 5\' 1"  (1.549 m)    Weight:   Wt Readings from Last 1 Encounters:  01/11/2019 51.7 kg    Ideal Body Weight:  47.72 kg  BMI:  There is no height or weight on file to calculate BMI.  Estimated Nutritional Needs:   Kcal:  1600-1800 kcal  Protein:  75-85g  Fluid:  >/= 1.6L    [employer] Dietetic Intern

## 2019-01-13 NOTE — Progress Notes (Signed)
Subjective: The patient is seen and examined today.  She was admitted yesterday with suspicious postobstructive pneumonia.  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  The patient received palliative radiotherapy to the neck mass.  She was then treated with systemic chemotherapy with carboplatin, paclitaxel and Keytruda for 4 cycles and she is currently on maintenance treatment with single agent Keytruda status post 9 cycles.  She was found on recent imaging studies to have enlarging right lower lobe mass with extension into the subcarinal nodal station with postobstructive collapse of who is in the right lower lobe.  She was started on palliative radiotherapy to this area.  She was admitted with the concerning postobstructive pneumonia which was seen partially on the previous imaging studies.  She was started on antibiotics with cefepime and vancomycin.  She is feeling a little bit better today. Objective: Vital signs in last 24 hours: Temp:  [98.2 F (36.8 C)-98.9 F (37.2 C)] 98.4 F (36.9 C) (02/13 1732) Pulse Rate:  [73-90] 88 (02/13 1732) Resp:  [16-23] 16 (02/13 1732) BP: (92-151)/(51-66) 99/66 (02/13 1732) SpO2:  [92 %-100 %] 100 % (02/13 1732)  Intake/Output from previous day: 02/12 0701 - 02/13 0700 In: 1345.5 [I.V.:0.1; IV Piggyback:1345.4] Out: -  Intake/Output this shift: No intake/output data recorded.  General appearance: alert, cooperative, fatigued and no distress Resp: diminished breath sounds RLL and dullness to percussion RLL Cardio: regular rate and rhythm, S1, S2 normal, no murmur, click, rub or gallop GI: soft, non-tender; bowel sounds normal; no masses,  no organomegaly Extremities: extremities normal, atraumatic, no cyanosis or edema  Lab Results:   Recent Labs    01/25/2019 1441 01/13/19 0540  WBC 11.0* 9.4  HGB 9.7* 8.5*  HCT 28.1* 24.3*  PLT 206 198   BMET Recent Labs    01/17/2019 1441 01/13/19 0540  NA 132* 137  K 4.0 3.4*  CL 102 110  CO2 22 23  GLUCOSE 94 94  BUN 30* 24*  CREATININE 0.75 0.56  CALCIUM 10.8* 10.2    Studies/Results: Dg Chest 2 View  Result Date: 01/19/2019 CLINICAL DATA:  Weakness and mental status change x1 day. Chest pain x2 days radiating to the upper abdomen and along right side of ribs. EXAM: CHEST - 2 VIEW COMPARISON:  CT 12/14/2018 FINDINGS: Obscuration of the right hemidiaphragm and right heart border from known right pleural effusion and presumed pulmonary mass though this is obscured by the effusion. No significant change is identified. Mild central vascular congestion is seen. Left lung is relatively clear with port catheter in place from left IJ approach. The tip terminates in the distal SVC. No aggressive osseous lesions. No pneumothorax. IMPRESSION: Obscuration of the right hemidiaphragm and right heart border from known right small to moderate pleural effusion and presumed pulmonary mass. No significant change from prior. Electronically Signed   By: Ashley Royalty M.D.   On: 01/16/2019 15:26   Ct Angio Chest Pe W And/or Wo Contrast  Result Date: 01/04/2019 CLINICAL DATA:  Lung cancer, weakness and mental status changes for 1 day, nausea, vomiting, chest pain for 2 days radiating from mid chest upper abdomen and down RIGHT side of ribs, high pretest probability for pulmonary embolism, additional past history of chronic bronchitis, GERD, hypertension, former smoker EXAM: CT ANGIOGRAPHY CHEST CT ABDOMEN AND PELVIS WITH  CONTRAST TECHNIQUE: Multidetector CT imaging of the chest was performed using the standard protocol during bolus administration of intravenous contrast. Multiplanar CT image reconstructions and MIPs were obtained to evaluate the vascular anatomy. Multidetector CT imaging of the abdomen  and pelvis was performed using the standard protocol during bolus administration of intravenous contrast. CONTRAST:  80mL ISOVUE-370 IOPAMIDOL (ISOVUE-370) INJECTION 76% IV. No oral contrast. COMPARISON:  CT chest abdomen and pelvis 12/14/2018 FINDINGS: CTA CHEST FINDINGS Cardiovascular: Atherosclerotic calcifications aorta, proximal great vessels and minimally in coronary arteries. Upper and origin of RIGHT subclavian artery arising last from aortic arch and coursing posterior to esophagus. Aorta normal caliber without aneurysm or dissection. Heart normal size. No pericardial effusion. Pulmonary arteries adequately opacified and patent. No evidence of pulmonary embolism. LEFT jugular Port-A-Cath with tip in SVC near cavoatrial junction. Mediastinum/Nodes: No definite esophageal abnormalities. Base of cervical region normal appearance. Enlarged precarinal lymph node 15 mm short axis image 34. Lungs/Pleura: Large necrotic tumor mass again identified involving the RIGHT hilum, mediastinum, and RIGHT infrahilar region into medial RIGHT lower lobe, approximately 6.0 x 3.9 cm image 42. Central gas/cavitation increased since previous exam. Mass abuts the descending thoracic aorta, RIGHT pulmonary artery, descending lobar intra pulmonary artery, and RIGHT mainstem bronchus, with occlusion of the bronchus intermedius. Complete opacification of the RIGHT middle lobe, with significant atelectasis and consolidation of the basilar segments of the RIGHT lower lobe. Infiltrates are present in the superior segment of the RIGHT lower lobe, could represent postobstructive pneumonitis or lymphangitic tumor spread. Partially loculated moderate RIGHT pleural effusion with compressive atelectasis of the posterior RIGHT upper lobe. Minimal dependent atelectasis in LEFT lower lobe. LEFT upper lobe nodule 7 mm diameter image 52 not significantly changed. Musculoskeletal: No osseous metastatic lesions. Review of the MIP images confirms the  above findings. CT ABDOMEN and PELVIS FINDINGS Hepatobiliary: Small cyst RIGHT lobe liver 13 x 10 mm unchanged. Mild intrahepatic biliary dilatation question physiologic post cholecystectomy, unchanged. Pancreas: Normal appearance Spleen: Normal appearance Adrenals/Urinary Tract: Adrenal glands normal appearance. Cyst at inferior pole RIGHT kidney anteriorly 2.3 x 2.2 cm image 41. Kidneys, ureters, and bladder otherwise normal appearance. Stomach/Bowel: Increased stool in rectum. Appendix surgically absent by history. Gastric wall appears prominent though this may be an artifact related to underdistention. Bowel loops otherwise normal appearance. Vascular/Lymphatic: Atherosclerotic calcifications aorta and iliac arteries. Aorta normal caliber. No adenopathy. Scattered pelvic phleboliths. Reproductive: Uterus surgically absent nonvisualization of ovaries. Other: No free air or free fluid.  No hernia. Musculoskeletal: Osseous structures demineralized without evidence of osseous metastasis. Review of the MIP images confirms the above findings. IMPRESSION: No evidence of pulmonary embolism. 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 Increased atelectasis and consolidation of the RIGHT lower lobe and RIGHT middle lobe with additional infiltrate in the superior segment of the RIGHT lower lobe which could represent postobstructive pneumonitis or lymphangitic tumor spread. Increased RIGHT pleural effusion, at least partially loculated. Increased stool in rectum. No other acute intra-abdominal or intrapelvic abnormalities. Electronically Signed   By: Lavonia Dana M.D.   On: 01/11/2019 18:46   Ct Abdomen Pelvis W Contrast  Result Date: 01/25/2019 CLINICAL DATA:  Lung cancer, weakness and mental status changes for 1 day, nausea, vomiting, chest pain for 2 days radiating from mid chest  upper abdomen and down RIGHT side of ribs, high pretest probability for pulmonary embolism, additional past history of chronic bronchitis,  GERD, hypertension, former smoker EXAM: CT ANGIOGRAPHY CHEST CT ABDOMEN AND PELVIS WITH CONTRAST TECHNIQUE: Multidetector CT imaging of the chest was performed using the standard protocol during bolus administration of intravenous contrast. Multiplanar CT image reconstructions and MIPs were obtained to evaluate the vascular anatomy. Multidetector CT imaging of the abdomen and pelvis was performed using the standard protocol during bolus administration of intravenous contrast. CONTRAST:  64mL ISOVUE-370 IOPAMIDOL (ISOVUE-370) INJECTION 76% IV. No oral contrast. COMPARISON:  CT chest abdomen and pelvis 12/14/2018 FINDINGS: CTA CHEST FINDINGS Cardiovascular: Atherosclerotic calcifications aorta, proximal great vessels and minimally in coronary arteries. Upper and origin of RIGHT subclavian artery arising last from aortic arch and coursing posterior to esophagus. Aorta normal caliber without aneurysm or dissection. Heart normal size. No pericardial effusion. Pulmonary arteries adequately opacified and patent. No evidence of pulmonary embolism. LEFT jugular Port-A-Cath with tip in SVC near cavoatrial junction. Mediastinum/Nodes: No definite esophageal abnormalities. Base of cervical region normal appearance. Enlarged precarinal lymph node 15 mm short axis image 34. Lungs/Pleura: Large necrotic tumor mass again identified involving the RIGHT hilum, mediastinum, and RIGHT infrahilar region into medial RIGHT lower lobe, approximately 6.0 x 3.9 cm image 42. Central gas/cavitation increased since previous exam. Mass abuts the descending thoracic aorta, RIGHT pulmonary artery, descending lobar intra pulmonary artery, and RIGHT mainstem bronchus, with occlusion of the bronchus intermedius. Complete opacification of the RIGHT middle lobe, with significant atelectasis and consolidation of the  basilar segments of the RIGHT lower lobe. Infiltrates are present in the superior segment of the RIGHT lower lobe, could represent postobstructive pneumonitis or lymphangitic tumor spread. Partially loculated moderate RIGHT pleural effusion with compressive atelectasis of the posterior RIGHT upper lobe. Minimal dependent atelectasis in LEFT lower lobe. LEFT upper lobe nodule 7 mm diameter image 52 not significantly changed. Musculoskeletal: No osseous metastatic lesions. Review of the MIP images confirms the above findings. CT ABDOMEN and PELVIS FINDINGS Hepatobiliary: Small cyst RIGHT lobe liver 13 x 10 mm unchanged. Mild intrahepatic biliary dilatation question physiologic post cholecystectomy, unchanged. Pancreas: Normal appearance Spleen: Normal appearance Adrenals/Urinary Tract: Adrenal glands normal appearance. Cyst at inferior pole RIGHT kidney anteriorly 2.3 x 2.2 cm image 41. Kidneys, ureters, and bladder otherwise normal appearance. Stomach/Bowel: Increased stool in rectum. Appendix surgically absent by history. Gastric wall appears prominent though this may be an artifact related to underdistention. Bowel loops otherwise normal appearance. Vascular/Lymphatic: Atherosclerotic calcifications aorta and iliac arteries. Aorta normal caliber. No adenopathy. Scattered pelvic phleboliths. Reproductive: Uterus surgically absent nonvisualization of ovaries. Other: No free air or free fluid.  No hernia. Musculoskeletal: Osseous structures demineralized without evidence of osseous metastasis. Review of the MIP images confirms the above findings. IMPRESSION: No evidence of pulmonary embolism. 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 Increased atelectasis and consolidation of the RIGHT lower lobe and RIGHT middle lobe with additional infiltrate in the superior segment of the RIGHT  lower lobe which could represent postobstructive pneumonitis or lymphangitic tumor spread. Increased RIGHT pleural effusion, at least partially loculated. Increased stool in rectum. No other acute intra-abdominal or intrapelvic abnormalities. Electronically Signed   By: Lavonia Dana M.D.   On: 01/24/2019 18:46    Medications: I have reviewed the patient's current medications.  Assessment/Plan: This is a very pleasant 68 years old African-American female with poorly differentiated carcinoma involving the head and neck as well as the lung.  She is currently undergoing treatment  with immunotherapy with Keytruda.  The patient had some evidence for disease progression with increase in the size of the central lung mass with right lower lobe lung collapse and likely postobstructive pneumonia secondary to this central mass.  I recommended for the patient to continue her current treatment with the palliative radiotherapy to the central lung mass with the hope to relieve the obstruction and also improve the postobstructive pneumonia. I also recommend for the patient to continue her current treatment with antibiotics. I will continue to hold her current treatment with immunotherapy until improvement of her condition. Thank you for taking good care of Ms. Martinique, I will continue to follow up the patient with you and assist in her management on as-needed basis.   LOS: 1 day    Eilleen Kempf 01/13/2019

## 2019-01-13 NOTE — Progress Notes (Signed)
MEWS Guidelines - (patients age 67 and over)  Red - At High Risk for Deterioration Yellow - At risk for Deterioration  1. Go to room and assess patient 2. Validate data. Is this patient's baseline? If data confirmed: 3. Is this an acute change? 4. Administer prn meds/treatments as ordered. 5. Note Sepsis score 6. Review goals of care 7. Sports coach, RRT nurse and Provider. 8. Ask Provider to come to bedside.  9. Document patient condition/interventions/response. 10. Increase frequency of vital signs and focused assessments to at least q15 minutes x 4, then q30 minutes x2. - If stable, then q1h x3, then q4h x3 and then q8h or dept. routine. - If unstable, contact Provider & RRT nurse. Prepare for possible transfer. 11. Add entry in progress notes using the smart phrase ".MEWS". 1. Go to room and assess patient 2. Validate data. Is this patient's baseline? If data confirmed: 3. Is this an acute change? 4. Administer prn meds/treatments as ordered? 5. Note Sepsis score 6. Review goals of care 7. Sports coach and Provider 8. Call RRT nurse as needed. 9. Document patient condition/interventions/response. 10. Increase frequency of vital signs and focused assessments to at least q2h x2. - If stable, then q4h x2 and then q8h or dept. routine. - If unstable, contact Provider & RRT nurse. Prepare for possible transfer. 11. Add entry in progress notes using the smart phrase ".MEWS".  Green - Likely stable Lavender - Comfort Care Only  1. Continue routine/ordered monitoring.  2. Review goals of care. 1. Continue routine/ordered monitoring. 2. Review goals of care.   VS to be monitored per MEWS yellow score, posted on door

## 2019-01-14 ENCOUNTER — Ambulatory Visit
Admission: RE | Admit: 2019-01-14 | Discharge: 2019-01-14 | Disposition: A | Discharge status: Home / Self Care | Payer: Medicare Other | Source: Ambulatory Visit | Attending: Radiation Oncology | Admitting: Radiation Oncology

## 2019-01-14 ENCOUNTER — Telehealth: Payer: Self-pay | Admitting: Medical Oncology

## 2019-01-14 ENCOUNTER — Institutional Professional Consult (permissible substitution): Payer: Self-pay | Admitting: Radiation Oncology

## 2019-01-14 DIAGNOSIS — E440 Moderate protein-calorie malnutrition: Secondary | ICD-10-CM

## 2019-01-14 LAB — CBC
HCT: 24 % — ABNORMAL LOW (ref 36.0–46.0)
HEMOGLOBIN: 8.2 g/dL — AB (ref 12.0–15.0)
MCH: 27.8 pg (ref 26.0–34.0)
MCHC: 34.2 g/dL (ref 30.0–36.0)
MCV: 81.4 fL (ref 80.0–100.0)
Platelets: 241 10*3/uL (ref 150–400)
RBC: 2.95 MIL/uL — ABNORMAL LOW (ref 3.87–5.11)
RDW: 14 % (ref 11.5–15.5)
WBC: 9.1 10*3/uL (ref 4.0–10.5)
nRBC: 0 % (ref 0.0–0.2)

## 2019-01-14 LAB — BASIC METABOLIC PANEL
Anion gap: 6 (ref 5–15)
BUN: 17 mg/dL (ref 8–23)
CO2: 21 mmol/L — ABNORMAL LOW (ref 22–32)
Calcium: 10.2 mg/dL (ref 8.9–10.3)
Chloride: 108 mmol/L (ref 98–111)
Creatinine, Ser: 0.43 mg/dL — ABNORMAL LOW (ref 0.44–1.00)
GFR calc Af Amer: >60 mL/min (ref >60)
GFR calc non Af Amer: >60 mL/min (ref >60)
Glucose, Bld: 106 mg/dL — ABNORMAL HIGH (ref 70–99)
POTASSIUM: 3.6 mmol/L (ref 3.5–5.1)
Sodium: 135 mmol/L (ref 135–145)

## 2019-01-14 MED ORDER — SODIUM CHLORIDE 0.9 % IV SOLN
INTRAVENOUS | Status: AC
  Administered 2019-01-14: 17:00:00 via INTRAVENOUS

## 2019-01-14 NOTE — Progress Notes (Signed)
[facility] Therapy Treatment Record Phone [phone]   Radiation Therapy was administered to Faith [name] on: 01/14/2019  5:47 PM and was treatment # 4 out of a planned course of 10 treatments.  Radiation Treatment  1). Beam photons with 6-10 energy  2). Brachytherapy None  3). Stereotactic Radiosurgery None  4). Other Radiation None     Quita Mcgrory F Morry Veiga, RT (T)

## 2019-01-14 NOTE — Telephone Encounter (Signed)
Updated dtr on continuing xrt , antibiotics and holding immunotherapy.

## 2019-01-14 NOTE — Progress Notes (Signed)
Palliative Medicine Team consult was received.   Chart reviewed and attempted to meet but patient currently off the floor (radiation).  Will plan to follow-up tomorrow.   If there are urgent needs or questions please call [phone]. Thank you for consulting out team to assist with this patients care.  Micheline Rough, MD [facility] Team [phone]  NO CHARGE NOTE

## 2019-01-14 NOTE — Care Management Important Message (Signed)
Important Message  Patient Details  Name: [name] MRN: [id number] Date of Birth: 05-18-1951   Medicare Important Message Given:  Yes    Kerin Salen 01/14/2019, 11:11 AMImportant Message  Patient Details  Name: [name] MRN: [id number] Date of Birth: 25-May-1951   Medicare Important Message Given:  Yes    Kerin Salen 01/14/2019, 11:11 AM

## 2019-01-14 NOTE — Progress Notes (Signed)
PROGRESS NOTE    Faith [name]  RCV:[id number] DOB: 1951-04-20 DOA: 01/19/2019 PCP: [name], MD    Brief Narrative:68 y.o.femalewith medical history significant ofstage IV lung cancer diagnosed a year ago undergoing chemotherapy and radiation, chronic abdominal pain and lower back pain, bipolar disorder comes in with a couple of days of coughing that has been productive and nonbloody with associated subjective fevers and chills at home. Patient also reports shortness of breath. She was getting radiation today when they noticed her oxygen sats were a little low so they sent her to the emergency department for further evaluation for PE. Patient denies any lower extremity swelling or edema. She denies chest pain but does endorse right upper quadrant abdominal pain which is been present since her cancer is been found with mets. Patient found to have postobstructive pneumonia and referred for admission for such. She was hospitalized November but has not had recent a biotics. She is being covered with cefepime and vancomycin.  Assessment & Plan:   Principal Problem:   HCAP (healthcare-associated pneumonia) Active Problems:   Right sided abdominal pain   Essential hypertension   Non-small cell carcinoma of right lung, stage 4 (HCC)   Chronic pain   Malnutrition of moderate degree    #1 stage IV lung cancer with mediastinal and hilar node involvement ongoing chemotherapy and radiation-CT of the chest shows large necrotic neoplasm at the inferior right pulmonary hilum invading the mediastinum 6 x 3.9 cm with increased central necrosis and new cavitation since prior study.  Increased atelectasis and consolidation of the right lower lobe and right middle lobe with additional infiltrate in the superior segment of the right lower lobe which could represent postobstructive pneumonitis or lymphocytic tumor spread.  inCreased right pleural effusion at least partially loculated.  MRSA PCR  negative will DC vancomycin and continue cefepime. Patient was started on radiation this week 1 day.  Dr. Tammi Klippel informed will continue XRT while in hospital.  #2 hypertension blood pressure still soft continue to hold antihypertensives.  Will give her 1 L of normal saline today.  #3 constipation continue stool softeners.  #4 severe deconditioning patient was seen by physical therapy recommends SNF on discharge.  DVT prophylaxis: SCD Code Status: Full code  Family Communication: No family available Disposition Plan: Pending clinical improvement  Consultants:   Oncology radiation oncology  Procedures: None Antimicrobials cefepime  Nutrition Problem: Moderate Malnutrition Etiology: chronic illness(Stage 4 Lung cancer)     Signs/Symptoms: moderate muscle depletion, moderate fat depletion, severe muscle depletion    Interventions: Ensure Enlive (each supplement provides 350kcal and 20 grams of protein)  Estimated body mass index is 21.54 kg/m as calculated from the following:   Height as of 01/03/19: 5\' 1"  (1.549 m).   Weight as of an earlier encounter on 01/20/2019: 51.7 kg.    Subjective: She feels slightly better than yesterday but continues with cough and shortness of breath  Objective: Vitals:   01/13/19 1732 01/13/19 2204 01/14/19 0149 01/14/19 0536  BP: 99/66 (!) 112/55 (!) 99/57 (!) 93/55  Pulse: 88 84 73 80  Resp: 16 (!) 32 20 20  Temp: 98.4 F (36.9 C) 98.7 F (37.1 C) 98 F (36.7 C) 98.2 F (36.8 C)  TempSrc: Oral Oral Oral Oral  SpO2: 100% 99% 100% 100%    Intake/Output Summary (Last 24 hours) at 01/14/2019 0912 Last data filed at 01/14/2019 0400 Gross per 24 hour  Intake 1154.64 ml  Output 150 ml  Net 1004.64 ml  There were no vitals filed for this visit.  Examination:  General exam: Appears calm and comfortable  Respiratory system: Scattered rhonchi bilaterally to auscultation. Respiratory effort normal. Cardiovascular system: S1 & S2  heard, RRR. No JVD, murmurs, rubs, gallops or clicks. No pedal edema. Gastrointestinal system: Abdomen is nondistended, soft and nontender. No organomegaly or masses felt. Normal bowel sounds heard. Central nervous system: Alert and oriented. No focal neurological deficits. Extremities: Symmetric 5 x 5 power. Skin: No rashes, lesions or ulcers Psychiatry: Judgement and insight appear normal. Mood & affect appropriate.     Data Reviewed: I have personally reviewed following labs and imaging studies  CBC: Recent Labs  Lab 01/14/2019 1441 01/13/19 0540 01/14/19 0500  WBC 11.0* 9.4 9.1  NEUTROABS  --  7.4  --   HGB 9.7* 8.5* 8.2*  HCT 28.1* 24.3* 24.0*  MCV 84.1 83.5 81.4  PLT 206 198 893   Basic Metabolic Panel: Recent Labs  Lab 01/07/2019 1441 01/13/19 0540 01/14/19 0500  NA 132* 137 135  K 4.0 3.4* 3.6  CL 102 110 108  CO2 22 23 21*  GLUCOSE 94 94 106*  BUN 30* 24* 17  CREATININE 0.75 0.56 0.43*  CALCIUM 10.8* 10.2 10.2   GFR: Estimated Creatinine Clearance: 52.2 mL/min (A) (by C-G formula based on SCr of 0.43 mg/dL (L)). Liver Function Tests: Recent Labs  Lab 01/23/2019 1441 01/13/19 0540  AST 53* 50*  ALT 23 27  ALKPHOS 95 110  BILITOT 3.8* 3.6*  PROT 6.8 6.1*  ALBUMIN 2.8* 2.6*   Recent Labs  Lab 01/07/2019 1441  LIPASE 19   No results for input(s): AMMONIA in the last 168 hours. Coagulation Profile: No results for input(s): INR, PROTIME in the last 168 hours. Cardiac Enzymes: Recent Labs  Lab 01/06/2019 1441  TROPONINI <0.03   BNP (last 3 results) No results for input(s): PROBNP in the last 8760 hours. HbA1C: No results for input(s): HGBA1C in the last 72 hours. CBG: Recent Labs  Lab 01/05/2019 1523  GLUCAP 90   Lipid Profile: No results for input(s): CHOL, HDL, LDLCALC, TRIG, CHOLHDL, LDLDIRECT in the last 72 hours. Thyroid Function Tests: No results for input(s): TSH, T4TOTAL, FREET4, T3FREE, THYROIDAB in the last 72 hours. Anemia Panel: No  results for input(s): VITAMINB12, FOLATE, FERRITIN, TIBC, IRON, RETICCTPCT in the last 72 hours. Sepsis Labs: Recent Labs  Lab 01/11/2019 1442  LATICACIDVEN 1.3    Recent Results (from the past 240 hour(s))  Blood culture (routine x 2)     Status: None (Preliminary result)   Collection Time: 01/16/2019  2:38 PM  Result Value Ref Range Status   Specimen Description   Final    BLOOD LEFT CHEST Performed at [facility] [address], [place], [place] [address]    Special Requests   Final    BOTTLES DRAWN AEROBIC AND ANAEROBIC Blood Culture adequate volume Performed at [facility] [address], [place], [place] [address]    Culture   Final    NO GROWTH 2 DAYS Performed at [facility] [name], [place], [place] [address]    Report Status PENDING  Incomplete  Blood culture (routine x 2)     Status: None (Preliminary result)   Collection Time: 01/23/2019  3:28 PM  Result Value Ref Range Status   Specimen Description   Final    BLOOD RIGHT FOREARM Performed at [facility] [address], [place], [place] [address]  Special Requests   Final    BOTTLES DRAWN AEROBIC AND ANAEROBIC Blood Culture adequate volume Performed at [facility] [address] [place], [place] [address]    Culture   Final    NO GROWTH 2 DAYS Performed at [facility] [address] [place], [place] [address]    Report Status PENDING  Incomplete  Respiratory Panel by PCR     Status: None   Collection Time: 01/13/19  8:55 AM  Result Value Ref Range Status   Adenovirus NOT DETECTED NOT DETECTED Final   Coronavirus 229E NOT DETECTED NOT DETECTED Final    Comment: (NOTE) The Coronavirus on the Respiratory Panel, DOES NOT test for the novel  Coronavirus (2019 nCoV)    Coronavirus HKU1 NOT DETECTED NOT DETECTED Final   Coronavirus NL63 NOT DETECTED NOT DETECTED Final   Coronavirus OC43 NOT DETECTED NOT  DETECTED Final   Metapneumovirus NOT DETECTED NOT DETECTED Final   Rhinovirus / Enterovirus NOT DETECTED NOT DETECTED Final   Influenza A NOT DETECTED NOT DETECTED Final   Influenza B NOT DETECTED NOT DETECTED Final   Parainfluenza Virus 1 NOT DETECTED NOT DETECTED Final   Parainfluenza Virus 2 NOT DETECTED NOT DETECTED Final   Parainfluenza Virus 3 NOT DETECTED NOT DETECTED Final   Parainfluenza Virus 4 NOT DETECTED NOT DETECTED Final   Respiratory Syncytial Virus NOT DETECTED NOT DETECTED Final   Bordetella pertussis NOT DETECTED NOT DETECTED Final   Chlamydophila pneumoniae NOT DETECTED NOT DETECTED Final   Mycoplasma pneumoniae NOT DETECTED NOT DETECTED Final    Comment: Performed at [facility], [address] [place], [place] [address]  MRSA PCR Screening     Status: None   Collection Time: 01/13/19  8:55 AM  Result Value Ref Range Status   MRSA by PCR NEGATIVE NEGATIVE Final    Comment:        The GeneXpert MRSA Assay (FDA approved for NASAL specimens only), is one component of a comprehensive MRSA colonization surveillance program. It is not intended to diagnose MRSA infection nor to guide or monitor treatment for MRSA infections. Performed at [facility], [place] [address], [place],  [address]          Radiology Studies: Dg Chest 2 View  Result Date: 01/19/2019 CLINICAL DATA:  Weakness and mental status change x1 day. Chest pain x2 days radiating to the upper abdomen and along right side of ribs. EXAM: CHEST - 2 VIEW COMPARISON:  CT 12/14/2018 FINDINGS: Obscuration of the right hemidiaphragm and right heart border from known right pleural effusion and presumed pulmonary mass though this is obscured by the effusion. No significant change is identified. Mild central vascular congestion is seen. Left lung is relatively clear with port catheter in place from left IJ approach. The tip terminates in the distal SVC. No aggressive osseous  lesions. No pneumothorax. IMPRESSION: Obscuration of the right hemidiaphragm and right heart border from known right small to moderate pleural effusion and presumed pulmonary mass. No significant change from prior. Electronically Signed   By: Ashley Royalty M.D.   On: 01/27/2019 15:26   Ct Angio Chest Pe W And/or Wo Contrast  Result Date: 01/21/2019 CLINICAL DATA:  Lung cancer, weakness and mental status changes for 1 day, nausea, vomiting, chest pain for 2 days radiating from mid chest upper abdomen and down RIGHT side of ribs, high pretest probability for pulmonary embolism, additional past history of chronic bronchitis, GERD, hypertension, former  smoker EXAM: CT ANGIOGRAPHY CHEST CT ABDOMEN AND PELVIS WITH CONTRAST TECHNIQUE: Multidetector CT imaging of the chest was performed using the standard protocol during bolus administration of intravenous contrast. Multiplanar CT image reconstructions and MIPs were obtained to evaluate the vascular anatomy. Multidetector CT imaging of the abdomen and pelvis was performed using the standard protocol during bolus administration of intravenous contrast. CONTRAST:  44mL ISOVUE-370 IOPAMIDOL (ISOVUE-370) INJECTION 76% IV. No oral contrast. COMPARISON:  CT chest abdomen and pelvis 12/14/2018 FINDINGS: CTA CHEST FINDINGS Cardiovascular: Atherosclerotic calcifications aorta, proximal great vessels and minimally in coronary arteries. Upper and origin of RIGHT subclavian artery arising last from aortic arch and coursing posterior to esophagus. Aorta normal caliber without aneurysm or dissection. Heart normal size. No pericardial effusion. Pulmonary arteries adequately opacified and patent. No evidence of pulmonary embolism. LEFT jugular Port-A-Cath with tip in SVC near cavoatrial junction. Mediastinum/Nodes: No definite esophageal abnormalities. Base of cervical region normal appearance. Enlarged precarinal lymph node 15 mm short axis image 34. Lungs/Pleura: Large necrotic tumor  mass again identified involving the RIGHT hilum, mediastinum, and RIGHT infrahilar region into medial RIGHT lower lobe, approximately 6.0 x 3.9 cm image 42. Central gas/cavitation increased since previous exam. Mass abuts the descending thoracic aorta, RIGHT pulmonary artery, descending lobar intra pulmonary artery, and RIGHT mainstem bronchus, with occlusion of the bronchus intermedius. Complete opacification of the RIGHT middle lobe, with significant atelectasis and consolidation of the basilar segments of the RIGHT lower lobe. Infiltrates are present in the superior segment of the RIGHT lower lobe, could represent postobstructive pneumonitis or lymphangitic tumor spread. Partially loculated moderate RIGHT pleural effusion with compressive atelectasis of the posterior RIGHT upper lobe. Minimal dependent atelectasis in LEFT lower lobe. LEFT upper lobe nodule 7 mm diameter image 52 not significantly changed. Musculoskeletal: No osseous metastatic lesions. Review of the MIP images confirms the above findings. CT ABDOMEN and PELVIS FINDINGS Hepatobiliary: Small cyst RIGHT lobe liver 13 x 10 mm unchanged. Mild intrahepatic biliary dilatation question physiologic post cholecystectomy, unchanged. Pancreas: Normal appearance Spleen: Normal appearance Adrenals/Urinary Tract: Adrenal glands normal appearance. Cyst at inferior pole RIGHT kidney anteriorly 2.3 x 2.2 cm image 41. Kidneys, ureters, and bladder otherwise normal appearance. Stomach/Bowel: Increased stool in rectum. Appendix surgically absent by history. Gastric wall appears prominent though this may be an artifact related to underdistention. Bowel loops otherwise normal appearance. Vascular/Lymphatic: Atherosclerotic calcifications aorta and iliac arteries. Aorta normal caliber. No adenopathy. Scattered pelvic phleboliths. Reproductive: Uterus surgically absent nonvisualization of ovaries. Other: No free air or free fluid.  No hernia. Musculoskeletal: Osseous  structures demineralized without evidence of osseous metastasis. Review of the MIP images confirms the above findings. IMPRESSION: No evidence of pulmonary embolism. 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 Increased atelectasis and consolidation of the RIGHT lower lobe and RIGHT middle lobe with additional infiltrate in the superior segment of the RIGHT lower lobe which could represent postobstructive pneumonitis or lymphangitic tumor spread. Increased RIGHT pleural effusion, at least partially loculated. Increased stool in rectum. No other acute intra-abdominal or intrapelvic abnormalities. Electronically Signed   By: Lavonia Dana M.D.   On: 01/19/2019 18:46   Ct Abdomen Pelvis W Contrast  Result Date: 01/19/2019 CLINICAL DATA:  Lung cancer, weakness and mental status changes for 1 day, nausea, vomiting, chest pain for 2 days radiating from mid chest upper abdomen and down RIGHT side of ribs, high pretest probability  for pulmonary embolism, additional past history of chronic bronchitis, GERD, hypertension, former smoker EXAM: CT ANGIOGRAPHY CHEST CT ABDOMEN AND PELVIS WITH CONTRAST TECHNIQUE: Multidetector CT imaging of the chest was performed using the standard protocol during bolus administration of intravenous contrast. Multiplanar CT image reconstructions and MIPs were obtained to evaluate the vascular anatomy. Multidetector CT imaging of the abdomen and pelvis was performed using the standard protocol during bolus administration of intravenous contrast. CONTRAST:  100mL ISOVUE-370 IOPAMIDOL (ISOVUE-370) INJECTION 76% IV. No oral contrast. COMPARISON:  CT chest abdomen and pelvis 12/14/2018 FINDINGS: CTA CHEST FINDINGS Cardiovascular: Atherosclerotic calcifications aorta, proximal great vessels and minimally in coronary arteries. Upper and origin of RIGHT subclavian  artery arising last from aortic arch and coursing posterior to esophagus. Aorta normal caliber without aneurysm or dissection. Heart normal size. No pericardial effusion. Pulmonary arteries adequately opacified and patent. No evidence of pulmonary embolism. LEFT jugular Port-A-Cath with tip in SVC near cavoatrial junction. Mediastinum/Nodes: No definite esophageal abnormalities. Base of cervical region normal appearance. Enlarged precarinal lymph node 15 mm short axis image 34. Lungs/Pleura: Large necrotic tumor mass again identified involving the RIGHT hilum, mediastinum, and RIGHT infrahilar region into medial RIGHT lower lobe, approximately 6.0 x 3.9 cm image 42. Central gas/cavitation increased since previous exam. Mass abuts the descending thoracic aorta, RIGHT pulmonary artery, descending lobar intra pulmonary artery, and RIGHT mainstem bronchus, with occlusion of the bronchus intermedius. Complete opacification of the RIGHT middle lobe, with significant atelectasis and consolidation of the basilar segments of the RIGHT lower lobe. Infiltrates are present in the superior segment of the RIGHT lower lobe, could represent postobstructive pneumonitis or lymphangitic tumor spread. Partially loculated moderate RIGHT pleural effusion with compressive atelectasis of the posterior RIGHT upper lobe. Minimal dependent atelectasis in LEFT lower lobe. LEFT upper lobe nodule 7 mm diameter image 52 not significantly changed. Musculoskeletal: No osseous metastatic lesions. Review of the MIP images confirms the above findings. CT ABDOMEN and PELVIS FINDINGS Hepatobiliary: Small cyst RIGHT lobe liver 13 x 10 mm unchanged. Mild intrahepatic biliary dilatation question physiologic post cholecystectomy, unchanged. Pancreas: Normal appearance Spleen: Normal appearance Adrenals/Urinary Tract: Adrenal glands normal appearance. Cyst at inferior pole RIGHT kidney anteriorly 2.3 x 2.2 cm image 41. Kidneys, ureters, and bladder otherwise  normal appearance. Stomach/Bowel: Increased stool in rectum. Appendix surgically absent by history. Gastric wall appears prominent though this may be an artifact related to underdistention. Bowel loops otherwise normal appearance. Vascular/Lymphatic: Atherosclerotic calcifications aorta and iliac arteries. Aorta normal caliber. No adenopathy. Scattered pelvic phleboliths. Reproductive: Uterus surgically absent nonvisualization of ovaries. Other: No free air or free fluid.  No hernia. Musculoskeletal: Osseous structures demineralized without evidence of osseous metastasis. Review of the MIP images confirms the above findings. IMPRESSION: No evidence of pulmonary embolism. 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 Increased atelectasis and consolidation of the RIGHT lower lobe and RIGHT middle lobe with additional infiltrate in the superior segment of the RIGHT lower lobe which could represent postobstructive pneumonitis or lymphangitic tumor spread. Increased RIGHT pleural effusion, at least partially loculated. Increased stool in rectum. No other acute intra-abdominal or intrapelvic abnormalities. Electronically Signed   By: Lavonia Dana M.D.   On: 01/03/2019 18:46        Scheduled Meds: . bisacodyl  10 mg Oral Daily  . clopidogrel  75 mg Oral Daily  . feeding supplement (ENSURE ENLIVE)  237 mL Oral  BID BM  . fluticasone  1 spray Each Nare Daily  . gabapentin  300 mg Oral TID  . loratadine  10 mg Oral Daily  . lubiprostone  24 mcg Oral BID WC  . potassium chloride  40 mEq Oral Daily  . senna-docusate  3 tablet Oral QHS  . sodium chloride flush  3 mL Intravenous Q12H  . tiZANidine  4 mg Oral BID   Continuous Infusions: . sodium chloride 250 mL (01/14/19 1696)  . ceFEPime (MAXIPIME) IV 1 g (01/14/19 0459)  . vancomycin Stopped (01/13/19 2256)     LOS: 2 days      Georgette Shell, MD [facility]  If 7PM-7AM, please contact night-coverage [email or web address] Password [facility] 01/14/2019, 9:12 AM

## 2019-01-15 DIAGNOSIS — C3491 Malignant neoplasm of unspecified part of right bronchus or lung: Secondary | ICD-10-CM

## 2019-01-15 DIAGNOSIS — Z7189 Other specified counseling: Secondary | ICD-10-CM

## 2019-01-15 DIAGNOSIS — Z515 Encounter for palliative care: Secondary | ICD-10-CM

## 2019-01-15 MED ORDER — OXYCODONE HCL 20 MG/ML PO CONC
10.0000 mg | ORAL | Status: DC | PRN
  Administered 2019-01-15: 20 mg via ORAL
  Administered 2019-01-15 – 2019-01-16 (×2): 10 mg via ORAL
  Administered 2019-01-16 – 2019-01-17 (×4): 20 mg via ORAL
  Administered 2019-01-17: 10 mg via ORAL
  Administered 2019-01-17 – 2019-01-20 (×10): 20 mg via ORAL
  Filled 2019-01-15 (×18): qty 1

## 2019-01-15 NOTE — Progress Notes (Signed)
PROGRESS NOTE    Faith [name]  IZT:[id number] DOB: 04/07/1951 DOA: 01/02/2019 PCP: [name], MD    Brief Narrative:68 y.o.femalewith medical history significant ofstage IV lung cancer diagnosed a year ago undergoing chemotherapy and radiation, chronic abdominal pain and lower back pain, bipolar disorder comes in with a couple of days of coughing that has been productive and nonbloody with associated subjective fevers and chills at home. Patient also reports shortness of breath. She was getting radiation today when they noticed her oxygen sats were a little low so they sent her to the emergency department for further evaluation for PE. Patient denies any lower extremity swelling or edema. She denies chest pain but does endorse right upper quadrant abdominal pain which is been present since her cancer is been found with mets. Patient found to have postobstructive pneumonia and referred for admission for such. She was hospitalized November but has not had recent a biotics. She is being covered with cefepime and vancomycin.   Assessment & Plan:   Principal Problem:   HCAP (healthcare-associated pneumonia) Active Problems:   Right sided abdominal pain   Essential hypertension   Non-small cell carcinoma of right lung, stage 4 (HCC)   Chronic pain   Malnutrition of moderate degree  #1 stage IV lung cancer with mediastinal and hilar node involvement ongoing chemotherapy and radiation-CT of the chest shows large necrotic neoplasm at the inferior right pulmonary hilum invading the mediastinum 6 x 3.9 cm with increased central necrosis and new cavitation since prior study. Increased atelectasis and consolidation of the right lower lobe and right middle lobe with additional infiltrate in the superior segment of the right lower lobe which could represent postobstructive pneumonitis or lymphocytic tumor spread. inCreased right pleural effusion at least partially loculated.  MRSA PCR  negative will DC vancomycin and continue cefepime. Patient was started on radiation this week 67 day.  Patient continued on palliative XRT while in hospital.  Await palliative care input.   #2 hypertension blood pressure still soft continue to hold antihypertensives.  Will give her 1 L of normal saline today.  #3 constipation continue stool softeners.  #4 severe deconditioning patient was seen by physical therapy recommends SNF on discharge.    Nutrition Problem: Moderate Malnutrition Etiology: chronic illness(Stage 4 Lung cancer)     Signs/Symptoms: moderate muscle depletion, moderate fat depletion, severe muscle depletion    Interventions: Ensure Enlive (each supplement provides 350kcal and 20 grams of protein)  Estimated body mass index is 21.54 kg/m as calculated from the following:   Height as of 01/03/19: 5\' 1"  (1.549 m).   Weight as of an earlier encounter on 01/20/2019: 51.7 kg.  DVT prophylaxis:SCD Code Status dnr Family Communication:Discussed with daughter Charisse March Disposition Plan: Pending clinical improvement  Consultants:  Oncology radiation oncology  Procedures: None Antimicrobials cefepime   Subjective  struggling to breathe complains of coughing short of breath wants to be a DNR    Objective: Vitals:   01/14/19 1454 01/14/19 2200 01/15/19 0630 01/15/19 0729  BP: 100/60 (!) 110/55 (!) 93/53   Pulse: 75 (!) 102 80   Resp: 16 16 16    Temp: 99.1 F (37.3 C) 99 F (37.2 C) 98.6 F (37 C)   TempSrc: Oral Oral Oral   SpO2: 100% 96% 93% 91%    Intake/Output Summary (Last 24 hours) at 01/15/2019 3825 Last data filed at 01/15/2019 0600 Gross per 24 hour  Intake 902.06 ml  Output -  Net 902.06 ml   There were  no vitals filed for this visit.  Examination:  General exam: Appears calm and comfortable  Respiratory system: Coarse breath sounds  to auscultation. Respiratory effort normal. Cardiovascular system: S1 & S2 heard, RRR. No JVD, murmurs,  rubs, gallops or clicks. No pedal edema. Gastrointestinal system: Abdomen is nondistended, soft and nontender. No organomegaly or masses felt. Normal bowel sounds heard. Central nervous system: Alert and oriented. No focal neurological deficits. Extremities: Symmetric 5 x 5 power. Skin: No rashes, lesions or ulcers Psychiatry: Judgement and insight appear normal. Mood & affect appropriate.     Data Reviewed: I have personally reviewed following labs and imaging studies  CBC: Recent Labs  Lab 01/21/2019 1441 01/13/19 0540 01/14/19 0500  WBC 11.0* 9.4 9.1  NEUTROABS  --  7.4  --   HGB 9.7* 8.5* 8.2*  HCT 28.1* 24.3* 24.0*  MCV 84.1 83.5 81.4  PLT 206 198 638   Basic Metabolic Panel: Recent Labs  Lab 01/18/2019 1441 01/13/19 0540 01/14/19 0500  NA 132* 137 135  K 4.0 3.4* 3.6  CL 102 110 108  CO2 22 23 21*  GLUCOSE 94 94 106*  BUN 30* 24* 17  CREATININE 0.75 0.56 0.43*  CALCIUM 10.8* 10.2 10.2   GFR: Estimated Creatinine Clearance: 52.2 mL/min (A) (by C-G formula based on SCr of 0.43 mg/dL (L)). Liver Function Tests: Recent Labs  Lab 01/16/2019 1441 01/13/19 0540  AST 53* 50*  ALT 23 27  ALKPHOS 95 110  BILITOT 3.8* 3.6*  PROT 6.8 6.1*  ALBUMIN 2.8* 2.6*   Recent Labs  Lab 01/03/2019 1441  LIPASE 19   No results for input(s): AMMONIA in the last 168 hours. Coagulation Profile: No results for input(s): INR, PROTIME in the last 168 hours. Cardiac Enzymes: Recent Labs  Lab 01/01/2019 1441  TROPONINI <0.03   BNP (last 3 results) No results for input(s): PROBNP in the last 8760 hours. HbA1C: No results for input(s): HGBA1C in the last 72 hours. CBG: Recent Labs  Lab 01/25/2019 1523  GLUCAP 90   Lipid Profile: No results for input(s): CHOL, HDL, LDLCALC, TRIG, CHOLHDL, LDLDIRECT in the last 72 hours. Thyroid Function Tests: No results for input(s): TSH, T4TOTAL, FREET4, T3FREE, THYROIDAB in the last 72 hours. Anemia Panel: No results for input(s):  VITAMINB12, FOLATE, FERRITIN, TIBC, IRON, RETICCTPCT in the last 72 hours. Sepsis Labs: Recent Labs  Lab 01/07/2019 1442  LATICACIDVEN 1.3    Recent Results (from the past 240 hour(s))  Blood culture (routine x 2)     Status: None (Preliminary result)   Collection Time: 01/11/2019  2:38 PM  Result Value Ref Range Status   Specimen Description   Final    BLOOD LEFT CHEST Performed at [facility] [address], [place], [place] [address]    Special Requests   Final    BOTTLES DRAWN AEROBIC AND ANAEROBIC Blood Culture adequate volume Performed at [facility] [address] [place], [place] [address]    Culture   Final    NO GROWTH 3 DAYS Performed at [facility], [place] [address] [place], [place] [address]    Report Status PENDING  Incomplete  Blood culture (routine x 2)     Status: None (Preliminary result)   Collection Time: 01/09/2019  3:28 PM  Result Value Ref Range Status   Specimen Description   Final    BLOOD RIGHT FOREARM Performed at [facility] [address], [place], [place] [address]  Special Requests   Final    BOTTLES DRAWN AEROBIC AND ANAEROBIC Blood Culture adequate volume Performed at [facility] [address] [place], [place] [address]    Culture   Final    NO GROWTH 3 DAYS Performed at [facility], [place] [address], [place], [place] [address]    Report Status PENDING  Incomplete  Respiratory Panel by PCR     Status: None   Collection Time: 01/13/19  8:55 AM  Result Value Ref Range Status   Adenovirus NOT DETECTED NOT DETECTED Final   Coronavirus 229E NOT DETECTED NOT DETECTED Final    Comment: (NOTE) The Coronavirus on the Respiratory Panel, DOES NOT test for the novel  Coronavirus (2019 nCoV)    Coronavirus HKU1 NOT DETECTED NOT DETECTED Final   Coronavirus NL63 NOT DETECTED NOT DETECTED Final   Coronavirus OC43 NOT DETECTED NOT DETECTED Final    Metapneumovirus NOT DETECTED NOT DETECTED Final   Rhinovirus / Enterovirus NOT DETECTED NOT DETECTED Final   Influenza A NOT DETECTED NOT DETECTED Final   Influenza B NOT DETECTED NOT DETECTED Final   Parainfluenza Virus 1 NOT DETECTED NOT DETECTED Final   Parainfluenza Virus 2 NOT DETECTED NOT DETECTED Final   Parainfluenza Virus 3 NOT DETECTED NOT DETECTED Final   Parainfluenza Virus 4 NOT DETECTED NOT DETECTED Final   Respiratory Syncytial Virus NOT DETECTED NOT DETECTED Final   Bordetella pertussis NOT DETECTED NOT DETECTED Final   Chlamydophila pneumoniae NOT DETECTED NOT DETECTED Final   Mycoplasma pneumoniae NOT DETECTED NOT DETECTED Final    Comment: Performed at [facility], [address], [place], [place] [address]  MRSA PCR Screening     Status: None   Collection Time: 01/13/19  8:55 AM  Result Value Ref Range Status   MRSA by PCR NEGATIVE NEGATIVE Final    Comment:        The GeneXpert MRSA Assay (FDA approved for NASAL specimens only), is one component of a comprehensive MRSA colonization surveillance program. It is not intended to diagnose MRSA infection nor to guide or monitor treatment for MRSA infections. Performed at [facility], [place] [address], [place], [place] [address]          Radiology Studies: No results found.      Scheduled Meds: . bisacodyl  10 mg Oral Daily  . clopidogrel  75 mg Oral Daily  . feeding supplement (ENSURE ENLIVE)  237 mL Oral BID BM  . fluticasone  1 spray Each Nare Daily  . gabapentin  300 mg Oral TID  . loratadine  10 mg Oral Daily  . lubiprostone  24 mcg Oral BID WC  . potassium chloride  40 mEq Oral Daily  . senna-docusate  3 tablet Oral QHS  . sodium chloride flush  3 mL Intravenous Q12H  . tiZANidine  4 mg Oral BID   Continuous Infusions: . sodium chloride 100 mL/hr at 01/14/19 1719  . sodium chloride 100 mL/hr at 01/15/19 0600  . ceFEPime (MAXIPIME) IV Stopped (01/15/19 0426)      LOS: 3 days     Georgette Shell, MD [facility]  If 7PM-7AM, please contact night-coverage [email or web address] Password [email or web address] 01/15/2019, 8:22 AM

## 2019-01-15 NOTE — Consult Note (Signed)
Consultation Note Date: 01/15/2019   Patient Name: Faith [name]  DOB: [birth year]  MRN: [id number]  Age / Sex: 68 y.o., female  PCP: [name], MD Referring Physician: Georgette Shell, MD  Reason for Consultation: Establishing goals of care, Non pain symptom management and Pain control  HPI/Patient Profile: 68 y.o. female  with past medical history of stage IV lung cancer diagnosed 1 year ago and undergoing treatment with Keytruda and radiation, chronic abdominal and lower back pain, bipolar disorder admitted on 01/04/2019 with obstructive pneumonia.  Palliative consulted for goals of care..   Clinical Assessment and Goals of Care: Met today with Faith [name] and her daughter.  I introduced palliative care as specialized medical care for people living with serious illness. It focuses on providing relief from the symptoms and stress of a serious illness. The goal is to improve quality of life for both the patient and the family.  We discussed her clinical course over the last year since she was diagnosed with stage IV lung cancer.  She and her daughter seem to have a good understanding of her overall condition including complication of postobstructive pneumonia.  At this point, she is invested in plan to continue with radiation therapy as is hopeful that this will help with her postobstructive pneumonia as well as her symptom of shortness of breath.  She states that she wants to complete this and see how she is feeling prior to reassessing her overall situation and if she is benefiting from continuation of [place].  We discussed her symptoms today.  We reviewed her pain regimen at home, which currently consist of oxycodone 20 mg.  She reports that when she has pain it is often in her abdomen and back and it is relieved by pain medication.  She states this medication does make her sleepy.  We also had  discussion regarding her other symptoms including current shortness of breath.  Has not noted if taking pain medication proves her shortness of breath, however, she is agreeable to trial of lower dose of oxycodone to see if this is beneficial.                                                                  SUMMARY OF RECOMMENDATIONS   - DNR/DNI- Confirmed today with patient and her daughter - Continue current interventions.  She reports understanding that her cancer is not going to be cured and will continue to progress.  She is hopeful that continuation of radiation will allow her to feel better if it is able to help with her postobstructive pneumonia.  She reports wanting to complete radiation and then reassess her situation. - Discussed plan for symptom management over the next few days while she continues with radiation therapy.  See below. -Plan for follow-up  tomorrow to reassess symptoms with consideration for increase in dose versus rotation of her opioid to another agent if it is not working to relieve her shortness of breath.  Code Status/Advance Care Planning:  DNR     Symptom Management:   Pain/SOB: Reports that shortness of breath and cough are her biggest symptoms.  We discussed her use of oxycodone at home and she reports that she takes 20 mg when she is having pain and this helps with her pain but makes her very sleepy.  Talked about use of opioids for dyspnea and she is agreeable to trial of lower doses of oxycodone to see if this helps with her shortness of breath.  I placed an order for oxycodone 10 to 20 mg every 3 hours as needed for pain or shortness of breath.  We discussed trying lower dose of oxycodone 10 mg every 3 hours as needed for shortness of breath and utilizing the higher dose of oxycodone 20 mg every 3 hours as needed for pain if it recurs.  At this point time she reports her pain is well controlled and predominant symptom is shortness of breath.  Palliative  Prophylaxis:   Delirium Protocol and Frequent Pain Assessment  Additional Recommendations (Limitations, Scope, Preferences):  Full Scope Treatment  Psycho-social/Spiritual:   Desire for further Chaplaincy support:no  Additional Recommendations: Caregiving  Support/Resources  Prognosis:  Unable to determine  Discharge Planning: To Be Determined      Primary Diagnoses: Present on Admission: . Chronic pain . Essential hypertension . Non-small cell carcinoma of right lung, stage 4 ([place]) . Right sided abdominal pain   I have reviewed the medical record, interviewed the patient and family, and examined the patient. The following aspects are pertinent.  Past Medical History:  Diagnosis Date  . Anxiety   . Arthritis    "thighs; legs; hips" (07/05/2014)  . Bipolar disorder ([place])   . Cancer ([place])   . Cholelithiasis 07/2014  . Chronic bronchitis ([place])    "get it q yr"  . Chronic lower back pain   . Depression    pt. use to go to depression clinic, states she still has depression & anxiety but can't get to appt. so she hasn't had any med. for it in a while   . Dyspnea   . GERD (gastroesophageal reflux disease)   . High cholesterol   . History of radiation therapy 02/01/18- 02/12/18   Right Neck/ 30 Gy in 10 fractions.   . Hypertension   . Mass in neck 12/2017   RIGHT SIDE OF NECK  . Peripheral vascular disease ([place])   . Schizophrenia ([place])   . Stroke [facility])    caused left eye blindness   Social History   Socioeconomic History  . Marital status: Single    Spouse name: Not on file  . Number of children: Not on file  . Years of education: Not on file  . Highest education level: Not on file  Occupational History  . Not on file  Social Needs  . Financial resource strain: Not on file  . Food insecurity:    Worry: Not on file    Inability: Not on file  . Transportation needs:    Medical: No    Non-medical: No  Tobacco Use  . Smoking status: Former Smoker     Packs/day: 1.00    Years: 48.00    Pack years: 48.00    Types: Cigarettes    Last attempt to quit: 01/28/2018  Years since quitting: 0.9  . Smokeless tobacco: Never Used  . Tobacco comment: Occasional.   Substance and Sexual Activity  . Alcohol use: No    Frequency: Never  . Drug use: No    Comment: 15-20 yrs. ago- used marijuana   . Sexual activity: Yes    Birth control/protection: Post-menopausal  Lifestyle  . Physical activity:    Days per week: Not on file    Minutes per session: Not on file  . Stress: Not on file  Relationships  . Social connections:    Talks on phone: Not on file    Gets together: Not on file    Attends religious service: Not on file    Active member of club or organization: Not on file    Attends meetings of clubs or organizations: Not on file    Relationship status: Not on file  Other Topics Concern  . Not on file  Social History Narrative  . Not on file   Family History  Problem Relation Age of Onset  . Cancer Mother 81       colon  . Diabetes Mother   . Hypertension Mother   . Cancer Father   . Diabetes Father   . Hypertension Father    Scheduled Meds: . bisacodyl  10 mg Oral Daily  . clopidogrel  75 mg Oral Daily  . feeding supplement (ENSURE ENLIVE)  237 mL Oral BID BM  . fluticasone  1 spray Each Nare Daily  . gabapentin  300 mg Oral TID  . loratadine  10 mg Oral Daily  . lubiprostone  24 mcg Oral BID WC  . potassium chloride  40 mEq Oral Daily  . senna-docusate  3 tablet Oral QHS  . sodium chloride flush  3 mL Intravenous Q12H  . tiZANidine  4 mg Oral BID   Continuous Infusions: . sodium chloride 100 mL/hr at 01/14/19 1719  . ceFEPime (MAXIPIME) IV 1 g (01/15/19 1324)   PRN Meds:.sodium chloride, albuterol, diphenhydrAMINE, guaiFENesin-dextromethorphan, ibuprofen, linaclotide, mometasone, oxyCODONE, sodium chloride flush Medications Prior to Admission:  Prior to Admission medications   Medication Sig Start Date End Date  Taking? Authorizing Provider  albuterol (PROVENTIL HFA;VENTOLIN HFA) 108 (90 Base) MCG/ACT inhaler Inhale 1-2 puffs into the lungs every 6 (six) hours as needed for wheezing or shortness of breath.   Yes [provider]  AMITIZA 24 MCG capsule Take 24 mcg by mouth 2 (two) times daily with a meal.  12/15/18  Yes [provider]  clopidogrel (PLAVIX) 75 MG tablet Take 1 tablet (75 mg total) by mouth daily. 02/19/17  Yes Hongalgi, Lenis Dickinson, MD  diphenhydrAMINE (BENADRYL) 25 MG tablet Take 2 tablets (50 mg total) by mouth at bedtime as needed for sleep. 09/11/15  Yes Evelina Bucy, MD  fluticasone (FLONASE) 50 MCG/ACT nasal spray Place 1 spray into both nostrils daily.  09/10/15  Yes [provider]  furosemide (LASIX) 20 MG tablet Take 20 mg by mouth daily as needed for fluid or edema.    Yes [provider]  gabapentin (NEURONTIN) 300 MG capsule Take 300 mg by mouth 3 (three) times daily.    Yes [provider]  ibuprofen (ADVIL,MOTRIN) 800 MG tablet Take 800 mg by mouth every 8 (eight) hours as needed for headache or mild pain.   Yes [provider]  linaclotide (LINZESS) 290 MCG CAPS capsule Take 1-2 capsules (290-580 mcg total) by mouth daily as needed (constipation). 10/25/18  Yes Regalado, Belkys A,  MD  loratadine (CLARITIN) 10 MG tablet Take 10 mg by mouth daily.   Yes [provider]  mometasone (ELOCON) 0.1 % ointment Apply 1 application topically daily as needed (irritation).    Yes [provider]  omeprazole (PRILOSEC) 20 MG capsule Take 20 mg by mouth 2 (two) times daily before a meal.   Yes [provider]  Oxycodone HCl 20 MG TABS Take 1 tablet by mouth 4 (four) times daily as needed (pain).  02/03/18  Yes [provider]  potassium chloride 20 MEQ/15ML (10%) SOLN Take 30 mLs by mouth daily. 10/07/18  Yes [provider]  sucralfate (CARAFATE) 1 g tablet Dissolve 1 tablet in 10 mL H20 and swallow  20 min prior to meals and bedtime. 12/28/18  Yes Eppie Gibson, MD  tiZANidine (ZANAFLEX) 4 MG tablet Take 4 mg by mouth 2 (two) times daily.  11/19/18  Yes [provider]  guaiFENesin-dextromethorphan (ROBITUSSIN DM) 100-10 MG/5ML syrup Take 5 mLs by mouth every 4 (four) hours as needed for cough. Patient not taking: Reported on 01/03/2019 10/25/18   Regalado, Jerald Kief A, MD  lactulose (CHRONULAC) 10 GM/15ML solution TAKE 30 MLS BY MOUTH DAILY AS NEEDED FOR CONSTIPATION Patient not taking: Reported on 01/07/2019 11/26/18   Curt Bears, MD  lidocaine (XYLOCAINE) 2 % solution Caregiver: Mix 1part 2% viscous lidocaine, 1part H20. Swish & swallow 13m of diluted mixture, 261m before meals and at bedtime, up to QID Patient not taking: Reported on 01/05/2019 02/12/18   SqEppie GibsonMD  lidocaine-prilocaine (EMLA) cream Apply 1 application topically as needed. Patient not taking: Reported on 01/06/2019 09/22/18   MoCurt BearsMD  nystatin (MYCOSTATIN) 100000 UNIT/ML suspension Take 5 mLs (500,000 Units total) by mouth 4 (four) times daily. Patient not taking: Reported on 01/20/2019 10/25/18   Regalado, BeJerald Kief, MD  polyethylene glycol (MIRALAX / GLYCOLAX) packet Take 17 g by mouth 2 (two) times daily. Patient not taking: Reported on 01/24/2019 10/25/18   Regalado, BeJerald Kief, MD  senna-docusate (SENOKOT-S) 8.6-50 MG tablet Take 1 tablet by mouth 2 (two) times daily. Patient not taking: Reported on 01/26/2019 10/25/18   Regalado, BeJerald Kief, MD  sucralfate (CARAFATE) 1 GM/10ML suspension Take 10 mLs (1 g total) by mouth 4 (four) times daily -  with meals and at bedtime. Take 20 minutes before meals. Patient not taking: Reported on 01/14/2019 12/28/18   SqEppie GibsonMD   Allergies  Allergen Reactions  . Penicillins Itching and Other (See Comments)    Causes yeast infections  PATIENT HAS HAD A PCN REACTION WITH IMMEDIATE RASH, FACIAL/TONGUE/THROAT SWELLING, SOB, OR LIGHTHEADEDNESS WITH  HYPOTENSION:  #  #  #  YES  #  #  #   Has patient had a PCN reaction causing severe rash involving mucus membranes or skin necrosis: Unk Has patient had a PCN reaction that required hospitalization: No Has patient had a PCN reaction occurring within the last 10 years: #  #  #  YES  #  #  #  If all of the above answers are "NO", then may proceed with Cephalosporin   . Codeine Itching and Other (See Comments)    And yeast infections  . Percocet [Oxycodone-Acetaminophen] Itching    Tolerates with Benadryl   Review of Systems  Physical Exam  General: Alert, awake, increased work or breathing.  HEENT: No bruits, no goiter, no JVD Heart: Regular Lungs: Coarse, fair air movement Abdomen: Nondistended  Ext: No significant edema Skin: Warm and  dry Neuro: Grossly intact, nonfocal.  Slight left eye deviation.   Vital Signs: BP (!) 87/54 (BP Location: Left Arm)   Pulse 83   Temp 98.4 F (36.9 C) (Oral)   Resp 16   SpO2 100%  Pain Scale: 0-10 POSS *See Group Information*: S-Acceptable,Sleep, easy to arouse Pain Score: Asleep   SpO2: SpO2: 100 % O2 Device:SpO2: 100 % O2 Flow Rate: .O2 Flow Rate (L/min): 2 L/min  IO: Intake/output summary:   Intake/Output Summary (Last 24 hours) at 01/15/2019 1410 Last data filed at 01/15/2019 0600 Gross per 24 hour  Intake 902.06 ml  Output -  Net 902.06 ml    LBM: Last BM Date: 01/14/19 Baseline Weight:   Most recent weight:       Palliative Assessment/Data:   Flowsheet Rows     Most Recent Value  Intake Tab  Referral Department  Hospitalist  Unit at Time of Referral  Oncology Unit  Palliative Care Primary Diagnosis  Cancer  Date Notified  01/13/19  Palliative Care Type  New Palliative care  Reason for referral  Clarify Goals of Care  Date of Admission  01/19/2019  Date first seen by Palliative Care  01/15/19  # of days Palliative referral response time  2 Day(s)  # of days IP prior to Palliative referral  1  Clinical Assessment    Pain Max last 24 hours  8  Pain Min Last 24 hours  0  Dyspnea Max Last 24 Hours  8  Dyspnea Min Last 24 hours  0  Psychosocial & Spiritual Assessment  Palliative Care Outcomes  Patient/Family meeting held?  Yes  Who was at the meeting?  Patient, daughter  Palliative Care Outcomes  Improved non-pain symptom therapy, Clarified goals of care      Time Total: 60 Greater than 50%  of this time was spent counseling and coordinating care related to the above assessment and plan.  Signed by: Micheline Rough, MD   Please contact Palliative Medicine Team phone at [phone] for questions and concerns.  For individual provider: See Shea Evans

## 2019-01-16 DIAGNOSIS — R0602 Shortness of breath: Secondary | ICD-10-CM

## 2019-01-16 LAB — COMPREHENSIVE METABOLIC PANEL
ALT: 37 U/L (ref 0–44)
AST: 56 U/L — ABNORMAL HIGH (ref 15–41)
Albumin: 2.3 g/dL — ABNORMAL LOW (ref 3.5–5.0)
Alkaline Phosphatase: 133 U/L — ABNORMAL HIGH (ref 38–126)
Anion gap: 4 — ABNORMAL LOW (ref 5–15)
BUN: 13 mg/dL (ref 8–23)
CO2: 22 mmol/L (ref 22–32)
Calcium: 10.2 mg/dL (ref 8.9–10.3)
Chloride: 113 mmol/L — ABNORMAL HIGH (ref 98–111)
Creatinine, Ser: 0.48 mg/dL (ref 0.44–1.00)
GFR calc Af Amer: >60 mL/min (ref >60)
GFR calc non Af Amer: >60 mL/min (ref >60)
Glucose, Bld: 119 mg/dL — ABNORMAL HIGH (ref 70–99)
Potassium: 3.7 mmol/L (ref 3.5–5.1)
Sodium: 139 mmol/L (ref 135–145)
Total Bilirubin: 2 mg/dL — ABNORMAL HIGH (ref 0.3–1.2)
Total Protein: 6 g/dL — ABNORMAL LOW (ref 6.5–8.1)

## 2019-01-16 LAB — CBC
HCT: 21 % — ABNORMAL LOW (ref 36.0–46.0)
Hemoglobin: 7.1 g/dL — ABNORMAL LOW (ref 12.0–15.0)
MCH: 27.5 pg (ref 26.0–34.0)
MCHC: 33.8 g/dL (ref 30.0–36.0)
MCV: 81.4 fL (ref 80.0–100.0)
NRBC: 0 % (ref 0.0–0.2)
Platelets: 299 10*3/uL (ref 150–400)
RBC: 2.58 MIL/uL — ABNORMAL LOW (ref 3.87–5.11)
RDW: 14.7 % (ref 11.5–15.5)
WBC: 9.5 10*3/uL (ref 4.0–10.5)

## 2019-01-16 LAB — STREP PNEUMONIAE URINARY ANTIGEN: Strep Pneumo Urinary Antigen: NEGATIVE

## 2019-01-16 NOTE — Progress Notes (Signed)
Pt appears to be aspirating with every sip of water she takes. Will ask rounding MD in am if wants swallow eval. Pt is DNR and palliative is consulting, she is here with pneumonia. Hoyle Barr, RN

## 2019-01-16 NOTE — Progress Notes (Signed)
Daily Progress Note   Patient Name: Faith [name]       Date: 01/16/2019 DOB: 11-Dec-1950  Age: 68 y.o. MRN#: [id number] Attending Physician: Georgette Shell, MD Primary Care Physician: [name], MD Admit Date: 01/25/2019  Reason for Consultation/Follow-up: Establishing goals of care, Non pain symptom management and Pain control  Subjective: I met with [name] today.  Still reports some SOB intermittently, but she states she feels better than yesterday.  Has used rescue dose x3 for SOB and states that she finds it to be helpful.  Would like to continue with current regimen for pain/sob.  Length of Stay: 4  Current Medications: Scheduled Meds:  . bisacodyl  10 mg Oral Daily  . clopidogrel  75 mg Oral Daily  . feeding supplement (ENSURE ENLIVE)  237 mL Oral BID BM  . fluticasone  1 spray Each Nare Daily  . gabapentin  300 mg Oral TID  . loratadine  10 mg Oral Daily  . lubiprostone  24 mcg Oral BID WC  . potassium chloride  40 mEq Oral Daily  . senna-docusate  3 tablet Oral QHS  . sodium chloride flush  3 mL Intravenous Q12H  . tiZANidine  4 mg Oral BID    Continuous Infusions: . sodium chloride 100 mL/hr at 01/14/19 1719  . ceFEPime (MAXIPIME) IV 1 g (01/16/19 0341)    PRN Meds: sodium chloride, albuterol, diphenhydrAMINE, guaiFENesin-dextromethorphan, ibuprofen, linaclotide, mometasone, oxyCODONE, sodium chloride flush  Physical Exam     General: Alert, awake, increased work or breathing but appears more comfortable than yesterday.  HEENT: No bruits, no goiter, no JVD Heart: Regular Lungs: Coarse, fair air movement Abdomen: Nondistended  Ext: No significant edema Skin: Warm and dry Neuro: Grossly intact, nonfocal.  Slight left eye deviation.      Vital  Signs: BP (!) 90/57 (BP Location: Left Arm)   Pulse 83   Temp 97.6 F (36.4 C) (Oral)   Resp 16   SpO2 97%  SpO2: SpO2: 97 % O2 Device: O2 Device: Room Air O2 Flow Rate: O2 Flow Rate (L/min): 2 L/min  Intake/output summary:   Intake/Output Summary (Last 24 hours) at 01/16/2019 1035 Last data filed at 01/16/2019 0521 Gross per 24 hour  Intake 580 ml  Output 200 ml  Net 380 ml   LBM:  Last BM Date: 01/15/19 Baseline Weight:   Most recent weight:         Palliative Assessment/Data:    Flowsheet Rows     Most Recent Value  Intake Tab  Referral Department  Hospitalist  Unit at Time of Referral  Oncology Unit  Palliative Care Primary Diagnosis  Cancer  Date Notified  01/13/19  Palliative Care Type  New Palliative care  Reason for referral  Clarify Goals of Care  Date of Admission  01/28/2019  Date first seen by Palliative Care  01/15/19  # of days Palliative referral response time  2 Day(s)  # of days IP prior to Palliative referral  1  Clinical Assessment  Pain Max last 24 hours  8  Pain Min Last 24 hours  0  Dyspnea Max Last 24 Hours  8  Dyspnea Min Last 24 hours  0  Psychosocial & Spiritual Assessment  Palliative Care Outcomes  Patient/Family meeting held?  Yes  Who was at the meeting?  Patient, daughter  Palliative Care Outcomes  Improved non-pain symptom therapy, Clarified goals of care      Patient Active Problem List   Diagnosis Date Noted  . Malignant neoplasm of right lung ([place])   . Malnutrition of moderate degree 01/14/2019  . HCAP (healthcare-associated pneumonia) 01/20/2019  . Dysphagia 12/16/2018  . Fecal impaction in rectum ([place]) 10/20/2018  . Fecal impaction of rectum ([place]) 10/20/2018  . Chronic pain 10/20/2018  . Port-A-Cath in place 04/28/2018  . Encounter for antineoplastic chemotherapy 04/21/2018  . Palliative care encounter 04/21/2018  . Non-small cell carcinoma of right lung, stage 4 ([place]) 02/23/2018  . Secondary and unspecified malignant  neoplasm of lymph nodes of head, face and neck ([place]) 01/05/2018  . Hyperkalemia 12/23/2017  . HLD (hyperlipidemia) 12/22/2017  . GERD (gastroesophageal reflux disease) 12/22/2017  . History of stroke 12/22/2017  . Tobacco abuse 12/22/2017  . Mass in neck 12/22/2017  . Hypokalemia 12/22/2017  . Hypercalcemia 12/22/2017  . Nausea vomiting and diarrhea 12/22/2017  . AKI (acute kidney injury) ([place]) 12/22/2017  . Stroke-like symptom 02/14/2017  . Low potassium syndrome 02/14/2017  . PAD (peripheral artery disease) ([place]) 01/15/2017  . Nonhealing nonsurgical wound 12/05/2016  . Essential hypertension 07/06/2014  . Depression 07/06/2014  . Back pain 07/06/2014  . Chronic cholecystitis 07/05/2014  . Right sided abdominal pain 05/11/2014  . Cholelithiasis 05/11/2014    Palliative Care Assessment & Plan   Assessment: 68 y.o. female  with past medical history of stage IV lung cancer diagnosed 1 year ago and undergoing treatment with Keytruda and radiation, chronic abdominal and lower back pain, bipolar disorder admitted on 01/24/2019 with obstructive pneumonia.  Palliative consulted for goals of care.  Recommendations/Plan:  Pain/SOB: Reports current regimen has been working well for her.  Denies pain or SOB at this time (does have some increased WOB).  Has used rescue dose x3 since yesterday.  Continue oxycodone 10 to 20 mg every 3 hours as needed for pain or shortness of breath.  Recommend lower dose of oxycodone 10 mg every 3 hours as needed for shortness of breath and utilizing the higher dose of oxycodone 20 mg every 3 hours as needed for pain if it recurs.    Continue current interventions.  She reports understanding that her cancer is not going to be cured and will continue to progress.  She is hopeful that continuation of radiation will allow her to feel better if it is able to help with her postobstructive  pneumonia.  She reports wanting to complete radiation and then reassess her  situation.  Code Status:    Code Status Orders  (From admission, onward)         Start     Ordered   01/15/19 0738  Do not attempt resuscitation (DNR)  Continuous    Question Answer Comment  In the event of cardiac or respiratory ARREST Do not call a "code blue"   In the event of cardiac or respiratory ARREST Do not perform Intubation, CPR, defibrillation or ACLS   In the event of cardiac or respiratory ARREST Use medication by any route, position, wound care, and other measures to relive pain and suffering. May use oxygen, suction and manual treatment of airway obstruction as needed for comfort.      01/15/19 0737        Code Status History    Date Active Date Inactive Code Status Order ID Comments User Context   01/15/2019 1941 01/15/2019 0737 Full Code [id number]  Phillips Grout, MD ED   10/20/2018 2346 10/25/2018 1410 Full Code [id number]  Vianne Bulls, MD ED   12/22/2017 2351 12/25/2017 1958 Full Code [id number]  Ivor Costa, MD ED   02/14/2017 0741 02/18/2017 2028 Full Code [id number]  Elwin Mocha, MD ED   01/15/2017 1614 01/19/2017 1738 Full Code [id number]  Waynetta Sandy, MD Inpatient   01/07/2017 0949 01/07/2017 1710 Full Code [id number]  Waynetta Sandy, MD Inpatient   07/05/2014 1118 07/06/2014 1444 Full Code [id number]  Greer Pickerel, MD Inpatient       Prognosis:   Unable to determine  Discharge Planning:  To Be Determined  Care plan was discussed with patient, RN  Thank you for allowing the Palliative Medicine Team to assist in the care of this patient.   Total Time 20 Prolonged Time Billed No      Greater than 50%  of this time was spent counseling and coordinating care related to the above assessment and plan.  Micheline Rough, MD  Please contact Palliative Medicine Team phone at [phone] for questions and concerns.

## 2019-01-17 ENCOUNTER — Inpatient Hospital Stay (HOSPITAL_COMMUNITY): Payer: Medicare Other

## 2019-01-17 ENCOUNTER — Ambulatory Visit
Admission: RE | Admit: 2019-01-17 | Discharge: 2019-01-17 | Disposition: A | Discharge status: Home / Self Care | Payer: Medicare Other | Source: Ambulatory Visit | Attending: Radiation Oncology | Admitting: Radiation Oncology

## 2019-01-17 LAB — CULTURE, BLOOD (ROUTINE X 2)
Culture: NO GROWTH
Culture: NO GROWTH
SPECIAL REQUESTS: ADEQUATE
Special Requests: ADEQUATE

## 2019-01-17 LAB — ABO/RH: ABO/RH(D): O POS

## 2019-01-17 LAB — PREPARE RBC (CROSSMATCH)

## 2019-01-17 MED ORDER — SODIUM CHLORIDE 0.9% IV SOLUTION
Freq: Once | INTRAVENOUS | Status: AC
  Administered 2019-01-17: 15:00:00 via INTRAVENOUS

## 2019-01-17 MED ORDER — LIP MEDEX EX OINT
TOPICAL_OINTMENT | CUTANEOUS | Status: DC | PRN
  Filled 2019-01-17 (×2): qty 7

## 2019-01-17 NOTE — Care Management Important Message (Signed)
Important Message  Patient Details  Name: Kaziyah A Martinique MRN: [id number] Date of Birth: [birth year]   Medicare Important Message Given:  Yes    Kerin Salen 01/17/2019, 11:43 AMImportant Message  Patient Details  Name: Pavielle A Martinique MRN: [id number] Date of Birth: 10-17-1951   Medicare Important Message Given:  Yes    Kerin Salen 01/17/2019, 11:43 AM

## 2019-01-17 NOTE — Clinical Social Work Note (Signed)
Clinical Social Work Assessment  Patient Details  Name: Faith [name] MRN: [id number] Date of Birth: 1951/06/09  Date of referral:  01/17/19               Reason for consult:  Facility Placement                Permission sought to share information with:  Family Supports Permission granted to share information::  Yes, Verbal Permission Granted  Name::     [name]  Agency::     Relationship::     Contact Information:     Housing/Transportation Living arrangements for the past 2 months:  Apartment Source of Information:  Patient, Scientist, water quality, Adult Children Patient Interpreter Needed:  None Criminal Activity/Legal Involvement Pertinent to Current Situation/Hospitalization:  No - Comment as needed Significant Relationships:  None Lives with:  Self Do you feel safe going back to the place where you live?  Yes Need for family participation in patient care:  Yes (Comment)(daughter, niece, and other family members involved)  Care giving concerns:  Pt admitted from home where she resides alone, with strong family support nearby ("they come over every day"). Has stage IV lung cancer post chemotherapy, currently having radiation treatments (til 01/25/19). Pt states she was managing at home okay PTA with family's help. Pt has had PCS services in the past but none currently. Daughter reports pt is active with home health unsure what agency- CSW has not confirmed this yet.    Social Worker assessment / plan:   CSW consulted to assist with disposition- SNF recommended. Pt is open to SNF, hesitant and would prefer to return home however daughter feeling she would benefit from short stay in rehabilitation. CSW made referrals to begin placement process. Daughter asking what care could be available at home if pt decides she will not be open to SNF at DC and CSW and she discussed some options briefly (daughter reports she is trying to get PCS reinstated and discussing supervision with other  family members). Pt has radiation scheduled until 01/25/19 and states since family members work, if she does admit to a SNF, will need transportation provided.  Will follow up with pt and daughter with SNF bed offers for further planning.    Employment status:  Retired Forensic scientist:  Information systems manager, Medicaid In [employer] PT Recommendations:  [place] / Referral to community resources:  [place]  Patient/Family's Response to care:  appreciative  Patient/Family's Understanding of and Emotional Response to Diagnosis, Current Treatment, and Prognosis:  Both pt and daughter seem to have a good understanding of pt's illness and treatment goals (description of history of treatment for her cancer was clear and matching chart). Both asked pertinent questions. Pt appeared somewhat flat- daughter noted pt's sister passed away last week the day pt was admitted to hospital, and that pt's other sister is in rehab in [place] herself right now, "It's a hard time for the family, all 3 sisters were in the hospital at the same time and losing one has been very hard for her."  Emotional Assessment Appearance:  Appears older than stated age Attitude/Demeanor/Rapport:  Engaged(drowsy) Affect (typically observed):  Flat Orientation:  Oriented to Self, Oriented to Place, Oriented to  Time, Oriented to Situation Alcohol / Substance use:  Not Applicable Psych involvement (Current and /or in the community):  No (Comment)(chart notes depression/anxiety outpatient treatment history, none current)  Discharge Needs  Concerns to be addressed:    Readmission within  the last 30 days:  No Current discharge risk:  Dependent with Mobility, Chronically ill Barriers to Discharge:  Continued Medical Work up   [employer], LCSW 01/17/2019, 3:01 PM [phone]

## 2019-01-17 NOTE — NC FL2 (Signed)
[place] MEDICAID FL2 LEVEL OF CARE SCREENING TOOL     IDENTIFICATION  Patient Name: Faith [name] Birthdate: 11/27/1951 Sex: female Admission Date (Current Location): 01/26/2019  [facility] and [place] Number:  Herbalist and Address:  [facility],  [address], [place]      Provider Number: [id number]  Attending Physician Name and Address:  Georgette Shell, MD  Relative Name and Phone Number:       Current Level of Care: Hospital Recommended Level of Care: [place] Prior Approval Number:    Date Approved/Denied:   PASRR Number: [id number]  Discharge Plan: SNF    Current Diagnoses: Patient Active Problem List   Diagnosis Date Noted  . Malignant neoplasm of right lung ([place])   . Malnutrition of moderate degree 01/14/2019  . HCAP (healthcare-associated pneumonia) 01/09/2019  . Dysphagia 12/16/2018  . Fecal impaction in rectum ([place]) 10/20/2018  . Fecal impaction of rectum ([place]) 10/20/2018  . Chronic pain 10/20/2018  . Port-A-Cath in place 04/28/2018  . Encounter for antineoplastic chemotherapy 04/21/2018  . Palliative care encounter 04/21/2018  . Non-small cell carcinoma of right lung, stage 4 ([place]) 02/23/2018  . Secondary and unspecified malignant neoplasm of lymph nodes of head, face and neck ([place]) 01/05/2018  . Hyperkalemia 12/23/2017  . HLD (hyperlipidemia) 12/22/2017  . GERD (gastroesophageal reflux disease) 12/22/2017  . History of stroke 12/22/2017  . Tobacco abuse 12/22/2017  . Mass in neck 12/22/2017  . Hypokalemia 12/22/2017  . Hypercalcemia 12/22/2017  . Nausea vomiting and diarrhea 12/22/2017  . AKI (acute kidney injury) ([place]) 12/22/2017  . Stroke-like symptom 02/14/2017  . Low potassium syndrome 02/14/2017  . PAD (peripheral artery disease) ([place]) 01/15/2017  . Nonhealing nonsurgical wound 12/05/2016  . Essential hypertension 07/06/2014  . Depression 07/06/2014  . Back pain 07/06/2014  .  Chronic cholecystitis 07/05/2014  . Right sided abdominal pain 05/11/2014  . Cholelithiasis 05/11/2014    Orientation RESPIRATION BLADDER Height & Weight     Self, Time, Situation, Place  Normal Continent Weight: 114 lb (51.7 kg) Height:  5\' 1"  (154.9 cm)  BEHAVIORAL SYMPTOMS/MOOD NEUROLOGICAL BOWEL NUTRITION STATUS      Continent Diet(2 gram sodium)  AMBULATORY STATUS COMMUNICATION OF NEEDS Skin   Extensive Assist Verbally Normal                       Personal Care Assistance Level of Assistance  Bathing, Feeding, Dressing Bathing Assistance: Maximum assistance Feeding assistance: Independent Dressing Assistance: Limited assistance     Functional Limitations Info  Sight, Hearing, Speech Sight Info: Adequate Hearing Info: Adequate Speech Info: Adequate    SPECIAL CARE FACTORS FREQUENCY  PT (By licensed PT), OT (By licensed OT)     PT Frequency: 5x OT Frequency: 5x            Contractures Contractures Info: Not present    Additional Factors Info  Code Status, Allergies Code Status Info: DNR Allergies Info: Penicillins, Codeine, Percocet Oxycodone-acetaminophen           Current Medications (01/17/2019):  This is the current hospital active medication list Current Facility-Administered Medications  Medication Dose Route Frequency Provider Last Rate Last Dose  . 0.9 %  sodium chloride infusion  250 mL Intravenous PRN Derrill Kay A, MD 100 mL/hr at 01/14/19 1719    . albuterol (PROVENTIL) (2.5 MG/3ML) 0.083% nebulizer solution 2.5 mg  2.5 mg Nebulization Q6H PRN Phillips Grout, MD  2.5 mg at 01/15/19 0728  . bisacodyl (DULCOLAX) EC tablet 10 mg  10 mg Oral Daily Georgette Shell, MD   10 mg at 01/17/19 1116  . ceFEPIme (MAXIPIME) 1 g in sodium chloride 0.9 % 100 mL IVPB  1 g Intravenous Q8H David, Rachal A, MD 200 mL/hr at 01/17/19 1330 1 g at 01/17/19 1330  . clopidogrel (PLAVIX) tablet 75 mg  75 mg Oral Daily Derrill Kay A, MD   75 mg at 01/17/19  1116  . diphenhydrAMINE (BENADRYL) capsule 50 mg  50 mg Oral QHS PRN Derrill Kay A, MD      . feeding supplement (ENSURE ENLIVE) (ENSURE ENLIVE) liquid 237 mL  237 mL Oral BID BM Derrill Kay A, MD   237 mL at 01/17/19 1116  . fluticasone (FLONASE) 50 MCG/ACT nasal spray 1 spray  1 spray Each Nare Daily Derrill Kay A, MD   1 spray at 01/17/19 1117  . gabapentin (NEURONTIN) capsule 300 mg  300 mg Oral TID Phillips Grout, MD   300 mg at 01/17/19 1116  . guaiFENesin-dextromethorphan (ROBITUSSIN DM) 100-10 MG/5ML syrup 5 mL  5 mL Oral Q4H PRN Phillips Grout, MD   5 mL at 01/15/19 0206  . ibuprofen (ADVIL,MOTRIN) tablet 800 mg  800 mg Oral Q8H PRN Phillips Grout, MD      . linaclotide Rolan Lipa) capsule 290 mcg  290 mcg Oral Daily PRN Phillips Grout, MD      . lip balm (CARMEX) ointment   Topical PRN Micheline Rough, MD      . loratadine (CLARITIN) tablet 10 mg  10 mg Oral Daily Derrill Kay A, MD   10 mg at 01/17/19 1116  . lubiprostone (AMITIZA) capsule 24 mcg  24 mcg Oral BID WC Phillips Grout, MD   24 mcg at 01/17/19 1116  . mometasone (ELOCON) 0.1 % ointment 1 application  1 application Topical Daily PRN Derrill Kay A, MD      . oxyCODONE (ROXICODONE INTENSOL) 20 MG/ML concentrated solution 10-20 mg  10-20 mg Oral Q3H PRN Micheline Rough, MD   20 mg at 01/17/19 0529  . potassium chloride 20 MEQ/15ML (10%) solution 40 mEq  40 mEq Oral Daily Derrill Kay A, MD   40 mEq at 01/17/19 1117  . senna-docusate (Senokot-S) tablet 3 tablet  3 tablet Oral QHS Georgette Shell, MD   3 tablet at 01/16/19 2100  . sodium chloride flush (NS) 0.9 % injection 3 mL  3 mL Intravenous Q12H Derrill Kay A, MD   3 mL at 01/17/19 1117  . sodium chloride flush (NS) 0.9 % injection 3 mL  3 mL Intravenous PRN Phillips Grout, MD      . tiZANidine (ZANAFLEX) tablet 4 mg  4 mg Oral BID Phillips Grout, MD   4 mg at 01/17/19 1116   Facility-Administered Medications Ordered in Other Encounters  Medication Dose  Route Frequency Provider Last Rate Last Dose  . heparin lock flush 100 unit/mL  500 Units Intracatheter Once PRN Curt Bears, MD      . sodium chloride flush (NS) 0.9 % injection 10 mL  10 mL Intracatheter PRN Curt Bears, MD         Discharge Medications: Please see discharge summary for a list of discharge medications.  Relevant Imaging Results:  Relevant Lab Results:   Additional Information SS# [id number]  Nila Nephew, LCSW

## 2019-01-17 NOTE — Progress Notes (Addendum)
Daily Progress Note   Patient Name: Faith [name]       Date: 01/17/2019 DOB: 26-Aug-1951  Age: 68 y.o. MRN#: [id number] Attending Physician: Faith Shell, MD Primary Care Physician: Faith Ebbs, MD Admit Date: 01/11/2019  Reason for Consultation/Follow-up: Establishing goals of care, Non pain symptom management and Pain control  Subjective: I met with [name] today.  Still reports some SOB intermittently, but she states she feels better when she uses rescue medication.    Would like to continue with current regimen for pain/sob.  Length of Stay: 5  Current Medications: Scheduled Meds:  . bisacodyl  10 mg Oral Daily  . clopidogrel  75 mg Oral Daily  . feeding supplement (ENSURE ENLIVE)  237 mL Oral BID BM  . fluticasone  1 spray Each Nare Daily  . gabapentin  300 mg Oral TID  . loratadine  10 mg Oral Daily  . lubiprostone  24 mcg Oral BID WC  . potassium chloride  40 mEq Oral Daily  . senna-docusate  3 tablet Oral QHS  . sodium chloride flush  3 mL Intravenous Q12H  . tiZANidine  4 mg Oral BID    Continuous Infusions: . sodium chloride 250 mL (01/17/19 2012)  . ceFEPime (MAXIPIME) IV 1 g (01/17/19 2015)    PRN Meds: sodium chloride, albuterol, diphenhydrAMINE, guaiFENesin-dextromethorphan, ibuprofen, linaclotide, lip balm, mometasone, oxyCODONE, sodium chloride flush  Physical Exam     General: Alert, awake, increased work or breathing but appears comfortable HEENT: No bruits, no goiter, no JVD Heart: Regular Lungs: Coarse, fair air movement Abdomen: Nondistended  Ext: No significant edema Skin: Warm and dry Neuro: Grossly intact, nonfocal.  Slight left eye deviation.      Vital Signs: BP 121/66   Pulse 89   Temp 99 F (37.2 C) (Oral)   Resp (!) 22    Ht _0  (1.549 m)   Wt 51.7 kg   SpO2 100%   BMI 21.54 kg/m  SpO2: SpO2: 100 % O2 Device: O2 Device: Nasal Cannula O2 Flow Rate: O2 Flow Rate (L/min): 2 L/min  Intake/output summary:   Intake/Output Summary (Last 24 hours) at 01/17/2019 2339 Last data filed at 01/17/2019 1702 Gross per 24 hour  Intake 601.63 ml  Output -  Net 601.63 ml   LBM: Last BM Date:  01/15/19 Baseline Weight: Weight: 51.7 kg Most recent weight: Weight: 51.7 kg       Palliative Assessment/Data:    Flowsheet Rows     Most Recent Value  Intake Tab  Referral Department  Hospitalist  Unit at Time of Referral  Oncology Unit  Palliative Care Primary Diagnosis  Cancer  Date Notified  01/13/19  Palliative Care Type  New Palliative care  Reason for referral  Clarify Goals of Care  Date of Admission  01/15/2019  Date first seen by Palliative Care  01/15/19  # of days Palliative referral response time  2 Day(s)  # of days IP prior to Palliative referral  1  Clinical Assessment  Pain Max last 24 hours  8  Pain Min Last 24 hours  0  Dyspnea Max Last 24 Hours  8  Dyspnea Min Last 24 hours  0  Psychosocial & Spiritual Assessment  Palliative Care Outcomes  Patient/Family meeting held?  Yes  Who was at the meeting?  Patient, daughter  Palliative Care Outcomes  Improved non-pain symptom therapy, Clarified goals of care      Patient Active Problem List   Diagnosis Date Noted  . Malignant neoplasm of right lung ([place])   . Malnutrition of moderate degree 01/14/2019  . HCAP (healthcare-associated pneumonia) 01/06/2019  . Dysphagia 12/16/2018  . Fecal impaction in rectum ([place]) 10/20/2018  . Fecal impaction of rectum ([place]) 10/20/2018  . Chronic pain 10/20/2018  . Port-A-Cath in place 04/28/2018  . Encounter for antineoplastic chemotherapy 04/21/2018  . Palliative care encounter 04/21/2018  . Non-small cell carcinoma of right lung, stage 4 ([place]) 02/23/2018  . Secondary and unspecified malignant neoplasm of  lymph nodes of head, face and neck ([place]) 01/05/2018  . Hyperkalemia 12/23/2017  . HLD (hyperlipidemia) 12/22/2017  . GERD (gastroesophageal reflux disease) 12/22/2017  . History of stroke 12/22/2017  . Tobacco abuse 12/22/2017  . Mass in neck 12/22/2017  . Hypokalemia 12/22/2017  . Hypercalcemia 12/22/2017  . Nausea vomiting and diarrhea 12/22/2017  . AKI (acute kidney injury) ([place]) 12/22/2017  . Stroke-like symptom 02/14/2017  . Low potassium syndrome 02/14/2017  . PAD (peripheral artery disease) ([place]) 01/15/2017  . Nonhealing nonsurgical wound 12/05/2016  . Essential hypertension 07/06/2014  . Depression 07/06/2014  . Back pain 07/06/2014  . Chronic cholecystitis 07/05/2014  . Right sided abdominal pain 05/11/2014  . Cholelithiasis 05/11/2014    Palliative Care Assessment & Plan   Assessment: 68 y.o. female  with past medical history of stage IV lung cancer diagnosed 1 year ago and undergoing treatment with Keytruda and radiation, chronic abdominal and lower back pain, bipolar disorder admitted on 01/26/2019 with obstructive pneumonia.  Palliative consulted for goals of care.  Recommendations/Plan:  Pain/SOB: Reports current regimen has been working well for her.  Denies pain or SOB at this time (does have some increased WOB).  Continue oxycodone 10 to 20 mg every 3 hours as needed for pain or shortness of breath.  Recommend lower dose of oxycodone 10 mg every 3 hours as needed for shortness of breath and utilizing the higher dose of oxycodone 20 mg every 3 hours as needed for pain if it recurs.    Continue current interventions.  She reports understanding that her cancer is not going to be cured and will continue to progress.  She is hopeful that continuation of radiation will allow her to feel better if it is able to help with her postobstructive pneumonia.  She reports wanting to complete radiation and  then reassess her situation.  Code Status:    Code Status Orders  (From  admission, onward)         Start     Ordered   01/15/19 0738  Do not attempt resuscitation (DNR)  Continuous    Question Answer Comment  In the event of cardiac or respiratory ARREST Do not call a "code blue"   In the event of cardiac or respiratory ARREST Do not perform Intubation, CPR, defibrillation or ACLS   In the event of cardiac or respiratory ARREST Use medication by any route, position, wound care, and other measures to relive pain and suffering. May use oxygen, suction and manual treatment of airway obstruction as needed for comfort.      01/15/19 0737        Code Status History    Date Active Date Inactive Code Status Order ID Comments User Context   01/02/2019 1941 01/15/2019 0737 Full Code [id number]  Phillips Grout, MD ED   10/20/2018 2346 10/25/2018 1410 Full Code [id number]  Vianne Bulls, MD ED   12/22/2017 2351 12/25/2017 1958 Full Code [id number]  Ivor Costa, MD ED   02/14/2017 0741 02/18/2017 2028 Full Code [id number]  Elwin Mocha, MD ED   01/15/2017 1614 01/19/2017 1738 Full Code [id number]  Waynetta Sandy, MD Inpatient   01/07/2017 0949 01/07/2017 1710 Full Code [id number]  Waynetta Sandy, MD Inpatient   07/05/2014 1118 07/06/2014 1444 Full Code [id number]  Greer Pickerel, MD Inpatient       Prognosis:   Unable to determine  Discharge Planning:  To Be Determined  Care plan was discussed with patient, RN  Thank you for allowing the Palliative Medicine Team to assist in the care of this patient.   Total Time 20 Prolonged Time Billed No      Greater than 50%  of this time was spent counseling and coordinating care related to the above assessment and plan.  Micheline Rough, MD  Please contact Palliative Medicine Team phone at [phone] for questions and concerns.

## 2019-01-17 NOTE — Progress Notes (Signed)
PT Cancellation Note  Patient Details Name: Faith [name] MRN: [id number] DOB: 02-21-1951   Cancelled Treatment:    Reason Eval/Treat Not Completed: Medical issues which prohibited therapy - Pt continuously coughing and spitting up, and per RN pt just had to be suctioned, suspected aspiration. Pt unable to respond to PT with more than one word before coughing and spitting up. PT to check back as schedule allows.   Julien Girt, PT Acute Rehabilitation Services Pager [phone]  Office [phone]    Roxine Caddy D Elonda Husky 01/17/2019, 11:33 AM

## 2019-01-17 NOTE — Progress Notes (Signed)
PROGRESS NOTE    Faith [name]  XYI:[id number] DOB: 1951/07/09 DOA: 01/17/2019 PCP: [name], MD  Brief Narrative:68 y.o.femalewith medical history significant ofstage IV lung cancer diagnosed a year ago undergoing chemotherapy and radiation, chronic abdominal pain and lower back pain, bipolar disorder comes in with a couple of days of coughing that has been productive and nonbloody with associated subjective fevers and chills at home. Patient also reports shortness of breath. She was getting radiation today when they noticed her oxygen sats were a little low so they sent her to the emergency department for further evaluation for PE. Patient denies any lower extremity swelling or edema. She denies chest pain but does endorse right upper quadrant abdominal pain which is been present since her cancer is been found with mets. Patient found to have postobstructive pneumonia and referred for admission for such. She was hospitalized November but has not had recent a biotics. She is being covered with cefepime  Assessment & Plan:   Principal Problem:   HCAP (healthcare-associated pneumonia) Active Problems:   Right sided abdominal pain   Essential hypertension   Non-small cell carcinoma of right lung, stage 4 (H[facility])   Palliative care encounter   Chronic pain   Malnutrition of moderate degree   Malignant neoplasm of right lung ([facility])   #1 stage IV lung cancer with mediastinal and hilar node involvement ongoing chemotherapy and radiation-CT of the chest shows large necrotic neoplasm at the inferior right pulmonary hilum invading the mediastinum 6 x 3.9 cm with increased central necrosis and new cavitation since prior study. Increased atelectasis and consolidation of the right lower lobe and right middle lobe with additional infiltrate in the superior segment of the right lower lobe which could represent postobstructive pneumonitis or lymphocytic tumor spread. inCreased right pleural  effusion at least partially loculated.MRSA PCR negative  DC vancomycin and continue cefepime.Patient was started on radiation this week 67 day.  Patient continued on palliative XRT while in hospital.  Appreciate palliative care input.  Patient would like to have XRT done and get treated for pneumonia and to be discharged to skilled nursing facility.  Discussed with patient and daughter.   #2 hypertension blood pressure still soft continue to hold antihypertensives.   #3 constipation continue stool softeners.  #4 severe deconditioning patient was seen by physical therapy recommends SNF on discharge.  #5 anemia patient has chronic anemia at baseline.  This is probably secondary to hemodilution.  However will transfuse her 1 unit of packed RBC.  Her hemoglobin today 7.1.  DVT prophylaxis:SCD Code Status dnr Family Communication:Discussed with daughter Charisse March Disposition Plan: Pending clinical improvement  Consultants:  Oncologyradiation oncology  Procedures:None Antimicrobialscefepime   Nutrition Problem: Moderate Malnutrition Etiology: chronic illness(Stage 4 Lung cancer)     Signs/Symptoms: moderate muscle depletion, moderate fat depletion, severe muscle depletion    Interventions: Ensure Enlive (each supplement provides 350kcal and 20 grams of protein)  Estimated body mass index is 21.54 kg/m as calculated from the following:   Height as of this encounter: 5\' 1"  (1.549 m).   Weight as of this encounter: 51.7 kg.    Subjective: Patient resting in bed oxygen on appears comfortable feels her cough is better  Objective: Vitals:   01/15/19 2132 01/16/19 0521 01/16/19 2051 01/17/19 0529  BP: (!) 100/57 (!) 90/57 (!) 100/59 (!) 109/57  Pulse: 88 83 81 83  Resp: 20 16 20 20   Temp: 98.7 F (37.1 C) 97.6 F (36.4 C) 98.3 F (36.8 C) 98.2 F (  36.8 C)  TempSrc: Oral Oral Oral Oral  SpO2: 97% 97% 100% 100%  Weight:      Height:        Intake/Output  Summary (Last 24 hours) at 01/17/2019 0808 Last data filed at 01/17/2019 0551 Gross per 24 hour  Intake 949.07 ml  Output -  Net 949.07 ml   Filed Weights   01/13/2019 1940  Weight: 51.7 kg    Examination:  General exam: Appears calm and comfortable  Respiratory syste coarse to auscultation. Respiratory effort normal. Cardiovascular system: S1 & S2 heard, RRR. No JVD, murmurs, rubs, gallops or clicks. No pedal edema. Gastrointestinal system: Abdomen is nondistended, soft and nontender. No organomegaly or masses felt. Normal bowel sounds heard. Central nervous system: Alert and oriented. No focal neurological deficits. Extremities: Symmetric 5 x 5 power. Skin: No rashes, lesions or ulcers Psychiatry: Judgement and insight appear normal. Mood & affect appropriate.     Data Reviewed: I have personally reviewed following labs and imaging studies  CBC: Recent Labs  Lab 01/25/2019 1441 01/13/19 0540 01/14/19 0500 01/16/19 0625  WBC 11.0* 9.4 9.1 9.5  NEUTROABS  --  7.4  --   --   HGB 9.7* 8.5* 8.2* 7.1*  HCT 28.1* 24.3* 24.0* 21.0*  MCV 84.1 83.5 81.4 81.4  PLT 206 198 241 229   Basic Metabolic Panel: Recent Labs  Lab 01/15/2019 1441 01/13/19 0540 01/14/19 0500 01/16/19 0625  NA 132* 137 135 139  K 4.0 3.4* 3.6 3.7  CL 102 110 108 113*  CO2 22 23 21* 22  GLUCOSE 94 94 106* 119*  BUN 30* 24* 17 13  CREATININE 0.75 0.56 0.43* 0.48  CALCIUM 10.8* 10.2 10.2 10.2   GFR: Estimated Creatinine Clearance: 52.2 mL/min (by C-G formula based on SCr of 0.48 mg/dL). Liver Function Tests: Recent Labs  Lab 01/25/2019 1441 01/13/19 0540 01/16/19 0625  AST 53* 50* 56*  ALT 23 27 37  ALKPHOS 95 110 133*  BILITOT 3.8* 3.6* 2.0*  PROT 6.8 6.1* 6.0*  ALBUMIN 2.8* 2.6* 2.3*   Recent Labs  Lab 01/06/2019 1441  LIPASE 19   No results for input(s): AMMONIA in the last 168 hours. Coagulation Profile: No results for input(s): INR, PROTIME in the last 168 hours. Cardiac  Enzymes: Recent Labs  Lab 01/08/2019 1441  TROPONINI <0.03   BNP (last 3 results) No results for input(s): PROBNP in the last 8760 hours. HbA1C: No results for input(s): HGBA1C in the last 72 hours. CBG: Recent Labs  Lab 01/19/2019 1523  GLUCAP 90   Lipid Profile: No results for input(s): CHOL, HDL, LDLCALC, TRIG, CHOLHDL, LDLDIRECT in the last 72 hours. Thyroid Function Tests: No results for input(s): TSH, T4TOTAL, FREET4, T3FREE, THYROIDAB in the last 72 hours. Anemia Panel: No results for input(s): VITAMINB12, FOLATE, FERRITIN, TIBC, IRON, RETICCTPCT in the last 72 hours. Sepsis Labs: Recent Labs  Lab 01/11/2019 1442  LATICACIDVEN 1.3    Recent Results (from the past 240 hour(s))  Blood culture (routine x 2)     Status: None (Preliminary result)   Collection Time: 01/08/2019  2:38 PM  Result Value Ref Range Status   Specimen Description   Final    BLOOD LEFT CHEST Performed at [facility] [address] [place], [place] [address]    Special Requests   Final    BOTTLES DRAWN AEROBIC AND ANAEROBIC Blood Culture adequate volume Performed at [facility] [address] [place], [place] [address]  Culture   Final    NO GROWTH 4 DAYS Performed at [facility], [place] [address] [place], [place] [address]    Report Status PENDING  Incomplete  Blood culture (routine x 2)     Status: None (Preliminary result)   Collection Time: 01/21/2019  3:28 PM  Result Value Ref Range Status   Specimen Description   Final    BLOOD RIGHT FOREARM Performed at [facility] [address], [place], [place] [address]    Special Requests   Final    BOTTLES DRAWN AEROBIC AND ANAEROBIC Blood Culture adequate volume Performed at [facility] [address] [place], [place] [address]    Culture   Final    NO GROWTH 4 DAYS Performed at [facility], [place] [address] [place], [place] [address]    Report  Status PENDING  Incomplete  Respiratory Panel by PCR     Status: None   Collection Time: 01/13/19  8:55 AM  Result Value Ref Range Status   Adenovirus NOT DETECTED NOT DETECTED Final   Coronavirus 229E NOT DETECTED NOT DETECTED Final    Comment: (NOTE) The Coronavirus on the Respiratory Panel, DOES NOT test for the novel  Coronavirus (2019 nCoV)    Coronavirus HKU1 NOT DETECTED NOT DETECTED Final   Coronavirus NL63 NOT DETECTED NOT DETECTED Final   Coronavirus OC43 NOT DETECTED NOT DETECTED Final   Metapneumovirus NOT DETECTED NOT DETECTED Final   Rhinovirus / Enterovirus NOT DETECTED NOT DETECTED Final   Influenza A NOT DETECTED NOT DETECTED Final   Influenza B NOT DETECTED NOT DETECTED Final   Parainfluenza Virus 1 NOT DETECTED NOT DETECTED Final   Parainfluenza Virus 2 NOT DETECTED NOT DETECTED Final   Parainfluenza Virus 3 NOT DETECTED NOT DETECTED Final   Parainfluenza Virus 4 NOT DETECTED NOT DETECTED Final   Respiratory Syncytial Virus NOT DETECTED NOT DETECTED Final   Bordetella pertussis NOT DETECTED NOT DETECTED Final   Chlamydophila pneumoniae NOT DETECTED NOT DETECTED Final   Mycoplasma pneumoniae NOT DETECTED NOT DETECTED Final    Comment: Performed at [facility], [address], [place], [place] [address]  MRSA PCR Screening     Status: None   Collection Time: 01/13/19  8:55 AM  Result Value Ref Range Status   MRSA by PCR NEGATIVE NEGATIVE Final    Comment:        The GeneXpert MRSA Assay (FDA approved for NASAL specimens only), is one component of a comprehensive MRSA colonization surveillance program. It is not intended to diagnose MRSA infection nor to guide or monitor treatment for MRSA infections. Performed at [facility], [place] [address], [place],  [address]          Radiology Studies: No results found.      Scheduled Meds: . sodium chloride   Intravenous Once  . bisacodyl  10 mg Oral Daily  . clopidogrel   75 mg Oral Daily  . feeding supplement (ENSURE ENLIVE)  237 mL Oral BID BM  . fluticasone  1 spray Each Nare Daily  . gabapentin  300 mg Oral TID  . loratadine  10 mg Oral Daily  . lubiprostone  24 mcg Oral BID WC  . potassium chloride  40 mEq Oral Daily  . senna-docusate  3 tablet Oral QHS  . sodium chloride flush  3 mL Intravenous Q12H  . tiZANidine  4 mg Oral BID   Continuous Infusions: .  sodium chloride 100 mL/hr at 01/14/19 1719  . ceFEPime (MAXIPIME) IV Stopped (01/17/19 0551)     LOS: 5 days     Georgette Shell, MD [facility] If 7PM-7AM, please contact night-coverage [email or web address] Password [email or web address] 01/17/2019, 8:08 AM

## 2019-01-18 ENCOUNTER — Ambulatory Visit
Admission: RE | Admit: 2019-01-18 | Discharge: 2019-01-18 | Disposition: A | Discharge status: Home / Self Care | Payer: Medicare Other | Source: Ambulatory Visit | Attending: Radiation Oncology | Admitting: Radiation Oncology

## 2019-01-18 ENCOUNTER — Encounter (INDEPENDENT_AMBULATORY_CARE_PROVIDER_SITE_OTHER): Payer: Self-pay | Admitting: Physical Medicine and Rehabilitation

## 2019-01-18 LAB — BPAM RBC
BLOOD PRODUCT EXPIRATION DATE: 202003132359
ISSUE DATE / TIME: 202002171437
Unit Type and Rh: 5100

## 2019-01-18 LAB — CBC
HCT: 27.6 % — ABNORMAL LOW (ref 36.0–46.0)
Hemoglobin: 9.3 g/dL — ABNORMAL LOW (ref 12.0–15.0)
MCH: 28.7 pg (ref 26.0–34.0)
MCHC: 33.7 g/dL (ref 30.0–36.0)
MCV: 85.2 fL (ref 80.0–100.0)
Platelets: 278 10*3/uL (ref 150–400)
RBC: 3.24 MIL/uL — ABNORMAL LOW (ref 3.87–5.11)
RDW: 15.9 % — ABNORMAL HIGH (ref 11.5–15.5)
WBC: 13.6 10*3/uL — ABNORMAL HIGH (ref 4.0–10.5)
nRBC: 0.1 % (ref 0.0–0.2)

## 2019-01-18 LAB — TYPE AND SCREEN
ABO/RH(D): O POS
Antibody Screen: NEGATIVE
Unit division: 0

## 2019-01-18 MED ORDER — LACTULOSE 10 GM/15ML PO SOLN
20.0000 g | ORAL | Status: AC
  Administered 2019-01-18: 20 g via ORAL
  Filled 2019-01-18: qty 30

## 2019-01-18 MED ORDER — POLYETHYLENE GLYCOL 3350 17 G PO PACK
17.0000 g | PACK | Freq: Every day | ORAL | Status: DC
  Administered 2019-01-19: 17 g via ORAL
  Filled 2019-01-18: qty 1

## 2019-01-18 MED ORDER — BISACODYL 10 MG RE SUPP
10.0000 mg | Freq: Once | RECTAL | Status: AC
  Administered 2019-01-18: 10 mg via RECTAL
  Filled 2019-01-18: qty 1

## 2019-01-18 MED ORDER — SORBITOL 70 % SOLN
960.0000 mL | TOPICAL_OIL | Freq: Once | ORAL | Status: AC
  Administered 2019-01-19: 960 mL via RECTAL
  Filled 2019-01-18: qty 473

## 2019-01-18 NOTE — Progress Notes (Signed)
Physical Therapy Treatment Patient Details Name: Faith [name] MRN: [id number] DOB: [birth year] Today's Date: 01/18/2019    History of Present Illness 68 yo female admitted to ED on 2/12 from the cancer center with chest pain, weakness, ShOB. Work up - for PE, imaging revealed obstruction of R hemidiaphragm and R heart border from known R small to moderate pleaural effusion and pulmonary mass (no change). Pt with medical diagnosis of post-obstructive PNAPt being treated with radiation for metastatic lung cancer with neck involvement. Other PMH includes depression, anxiety, bipolar disorder, chronic LBP, chronic bronchitis, dyspnea, GERD, HTN, PVD, schizophrenia, stroke with L eye blindness, femoral-popliteal bypass graft.      PT Comments    Patient seen for mobility progression. Motivated to participate with therapy. Patient requiring MinA to min guard throughout mobility for safety and stability. Patient slightly impulsive vs urgent feeling of needing to use restroom increasing her fall risk. Making good progress towards goals. Will continue to follow.    Follow Up Recommendations  SNF;Supervision/Assistance - 24 hour(vs HHPT, pending pt progress with mobility)     Equipment Recommendations  None recommended by PT    Recommendations for Other Services       Precautions / Restrictions Precautions Precautions: Fall Precaution Comments: on 2L via [place] - stats stable with and without 2L Restrictions Weight Bearing Restrictions: No    Mobility  Bed Mobility Overal bed mobility: Needs Assistance Bed Mobility: Supine to Sit     Supine to sit: Min guard     General bed mobility comments: min guard for line and tube management; slightly impulsive  Transfers Overall transfer level: Needs assistance Equipment used: Straight cane Transfers: Sit to/from Stand Sit to Stand: Min assist         General transfer comment: min A from bedside to power up and for stability; Min A from  toilet  Ambulation/Gait Ambulation/Gait assistance: Min guard;Min assist Gait Distance (Feet): 15 Feet(2 reps) Assistive device: Straight cane;1 person hand held assist Gait Pattern/deviations: Step-to pattern;Step-through pattern;Decreased stride length Gait velocity: decr    General Gait Details: varying gait pattern with shuffle steps, step to and step through pattern; 1 HHA and SPC for stability; fatigues quickly; SpO2 on RA >92% throughout mobility   Stairs             Wheelchair Mobility    Modified Rankin (Stroke Patients Only)       Balance Overall balance assessment: Needs assistance Sitting-balance support: No upper extremity supported;Feet supported Sitting balance-Leahy Scale: Good     Standing balance support: Single extremity supported;During functional activity Standing balance-Leahy Scale: Fair                              Cognition Arousal/Alertness: Awake/alert Behavior During Therapy: WFL for tasks assessed/performed Overall Cognitive Status: Impaired/Different from baseline Area of Impairment: Following commands;Safety/judgement;Problem solving                       Following Commands: Follows one step commands consistently Safety/Judgement: Decreased awareness of deficits;Decreased awareness of safety   Problem Solving: Slow processing;Difficulty sequencing;Requires tactile cues General Comments: patient reaching for items out of her reach demonstrating reduced safety awareness      Exercises      General Comments General comments (skin integrity, edema, etc.): patient wishing to mobilize as she feels she needs to have a BM      Pertinent Vitals/Pain Pain Assessment: No/denies  pain Pain Score: 4  Pain Intervention(s): Patient requesting pain meds-RN notified;RN gave pain meds during session    Home Living                      Prior Function            PT Goals (current goals can now be found in  the care plan section) Acute Rehab PT Goals Patient Stated Goal: go home  PT Goal Formulation: With patient Time For Goal Achievement: 01/27/19 Potential to Achieve Goals: Fair Progress towards PT goals: Progressing toward goals    Frequency    Min 2X/week      PT Plan Current plan remains appropriate    Co-evaluation              AM-PAC PT "6 Clicks" Mobility   Outcome Measure  Help needed turning from your back to your side while in a flat bed without using bedrails?: A Little Help needed moving from lying on your back to sitting on the side of a flat bed without using bedrails?: A Little Help needed moving to and from a bed to a chair (including a wheelchair)?: A Little Help needed standing up from a chair using your arms (e.g., wheelchair or bedside chair)?: A Little Help needed to walk in hospital room?: A Little Help needed climbing 3-5 steps with a railing? : A Lot 6 Click Score: 17    End of Session Equipment Utilized During Treatment: Gait belt;Oxygen Activity Tolerance: Patient limited by fatigue Patient left: in chair;with call bell/phone within reach Nurse Communication: Mobility status PT Visit Diagnosis: Other abnormalities of gait and mobility (R26.89);Muscle weakness (generalized) (M62.81);Unsteadiness on feet (R26.81)     Time: 3704-8889 PT Time Calculation (min) (ACUTE ONLY): 18 min  Charges:  $Gait Training: 8-22 mins                     Lanney Gins, PT, DPT Supplemental Physical Therapist 01/18/19 2:53 PM Pager: [phone] Office: [phone]

## 2019-01-18 NOTE — Progress Notes (Signed)
Pt's SNF bed offers provided to pt's daughter- she is reviewing to make selection. Daughter states she was also updated about plan of care for today (See previous ST and attending notes)  Sharren Bridge, MSW, LCSW Clinical Social Work 01/18/2019 [phone]

## 2019-01-18 NOTE — Progress Notes (Signed)
BSE completed, full report to follow. Pt presents with clinical indications concerning for aspiration across all consistencies tested.  Severe congested coughing with respiratory difficulties with Boost Breeze - causing pt clear discomfort.   She required several minutes to recover from his episode.  Pt stated she "drank too fast" but denies this would happen at home even if she consumed liquid too quickly.   Pt desires to continue treatment and reassess goals per Dr Kirstie Mirza note.  As pt has not had a bowel movement, do not recommend MBS today to prevent risk of bowel obstruction.    She is to receive a suppository - thus recommendation is npo x meds/tsps water and ice, + bowel movement today and MBS tomorrow am.  Damaris Schooner to daughter [name] over the phone and she and pt were agreeable to plan.  Advised daughter and pt that pt may be diagnosed with severe dysphagia with aspiration with all consistencies - but need to do test to properly assess.    SLP wanted to make pt and daughter aware of possibilities.  Pt  Messaged Dr Zigmund Daniel with recommendations. Also pt has h/o mild dysphagia diagnosed 01/2018 from [facility] before she started radiation/chemo tx.    [name], [facility] SLP Acute Rehab Services Pager [phone] Office [phone]

## 2019-01-18 NOTE — Evaluation (Signed)
Clinical/Bedside Swallow Evaluation Patient Details  Name: Faith [name] MRN: [id number] Date of Birth: Dec 30, 1950  Today's Date: 01/18/2019 Time: SLP Start Time (ACUTE ONLY): 1125 SLP Stop Time (ACUTE ONLY): 1208 SLP Time Calculation (min) (ACUTE ONLY): 43 min  Past Medical History:  Past Medical History:  Diagnosis Date  . Anxiety   . Arthritis    "thighs; legs; hips" (07/05/2014)  . Bipolar disorder ([place])   . Cancer ([place])   . Cholelithiasis 07/2014  . Chronic bronchitis ([place])    "get it q yr"  . Chronic lower back pain   . Depression    pt. use to go to depression clinic, states she still has depression & anxiety but can't get to appt. so she hasn't had any med. for it in a while   . Dyspnea   . GERD (gastroesophageal reflux disease)   . High cholesterol   . History of radiation therapy 02/01/18- 02/12/18   Right Neck/ 30 Gy in 10 fractions.   . Hypertension   . Mass in neck 12/2017   RIGHT SIDE OF NECK  . Peripheral vascular disease ([place])   . Schizophrenia ([place])   . Stroke [name])    caused left eye blindness   Past Surgical History:  Past Surgical History:  Procedure Laterality Date  . ABDOMINAL AORTOGRAM N/A 01/07/2017   Procedure: Abdominal Aortogram;  Surgeon: Waynetta Sandy, MD;  Location: [place] [facility];  Service: Cardiovascular;  Laterality: N/A;  . ABDOMINAL HYSTERECTOMY    . ANKLE FRACTURE SURGERY Right   . ANKLE HARDWARE REMOVAL Right   . APPENDECTOMY  ~ 1963  . BREAST BIOPSY Bilateral   . BREAST CYST EXCISION Bilateral    "not cancer"  . CATARACT EXTRACTION W/ INTRAOCULAR LENS  IMPLANT, BILATERAL    . CHOLECYSTECTOMY N/A 07/05/2014   Procedure: LAPAROSCOPIC CHOLECYSTECTOMY WITH INTRAOPERATIVE CHOLANGIOGRAM;  Surgeon: Gayland Curry, MD;  Location: [place];  Service: General;  Laterality: N/A;  . EXCISIONAL HEMORRHOIDECTOMY    . FEMORAL-POPLITEAL BYPASS GRAFT Right 01/15/2017   Procedure: BYPASS GRAFT FEMORAL-POPLITEAL ARTERY RIGHT LEG;  Surgeon:  Waynetta Sandy, MD;  Location: [place];  Service: Vascular;  Laterality: Right;  . IR IMAGING GUIDED PORT INSERTION  03/08/2018  . IR US GUIDE VASC ACCESS LEFT  03/08/2018  . LAPAROSCOPIC CHOLECYSTECTOMY  07/05/2014  . LOWER EXTREMITY ANGIOGRAPHY Bilateral 01/07/2017   Procedure: Lower Extremity Angiography;  Surgeon: Waynetta Sandy, MD;  Location: [place] [facility];  Service: Cardiovascular;  Laterality: Bilateral;  . MASS BIOPSY Right 12/25/2017   Procedure: INCISIONAL RIGHT NECK MASS BIOPSY;  Surgeon: Helayne Seminole, MD;  Location: [place];  Service: ENT;  Laterality: Right;  . MULTIPLE TOOTH EXTRACTIONS  05/2013   "pulled my upper teeth"  . PILONIDAL CYST EXCISION     HPI:  68 yo female adm to [name] with h/o stage IV lung cancer admitted with respiratory deficits.  Pt has undergone chemo and radiation and is currently undergoing palliative radiation.  She has right neck mets per prior imaging study and h/o CVA.  Prior MBS 01/14/2018 showed sensory deficit with delayed swallow- mild deficits..  Pt has been coughing with intake and swallow eval ordered.    Assessment / Plan / Recommendation Clinical Impression  Pt presents with clinical indications concerning for aspiration across all consistencies tested.  Severe congested coughing with respiratory difficulties with Boost Breeze - causing pt clear discomfort.   She required several minutes to recover from his episode.  Pt stated she "drank too fast" but denies this would happen at home even if she consumed liquid too quickly. Pt desires to continue treatment and reassess goals per [name] note.  As pt has not had a bowel movement, do not recommend MBS today to prevent risk of bowel obstruction.  She is to receive a suppository - thus recommendation is npo x meds/tsps water and ice, + bowel movement today and MBS tomorrow am.  Faith [name] to [name] over the phone and she and pt were agreeable to plan.  Advised daughter and pt that  pt may be diagnosed with severe dysphagia with aspiration with all consistencies - but need to do test to properly assess.  SLP wanted to make pt and daughter aware of possibilities.  Messaged [name] with recommendations. Also pt has h/o mild dysphagia diagnosed 01/2018 from [name] before she started radiation/chemo tx.  ? Baseline deficits currently severe exacerbated due to deconditioning and pna.     SLP Visit Diagnosis: Dysphagia, oropharyngeal phase (R13.12)    Aspiration Risk  Severe aspiration risk;Risk for inadequate nutrition/hydration    Diet Recommendation NPO;NPO except meds;Ice chips PRN after oral care(tsps water)   Liquid Administration via: Spoon Medication Administration: (? crush with puree) Supervision: Patient able to self feed Compensations: Slow rate;Small sips/bites Postural Changes: Seated upright at 90 degrees;Remain upright for at least 30 minutes after po intake    Other  Recommendations Oral Care Recommendations: Oral care prior to ice chip/H20   Follow up Recommendations (tbd)      Frequency and Duration     tbd       Prognosis   guarded for swallowing     Swallow Study   General Date of Onset: 01/18/19 HPI: 68 yo female adm to [name] with h/o stage IV lung cancer admitted with respiratory deficits.  Pt has undergone chemo and radiation and is currently undergoing palliative radiation.  She has right neck mets per prior imaging study and h/o CVA.  Prior MBS 01/14/2018 showed sensory deficit with delayed swallow- mild deficits..  Pt has been coughing with intake and swallow eval ordered.  Type of Study: Bedside Swallow Evaluation Diet Prior to this Study: Regular;Thin liquids Temperature Spikes Noted: No Respiratory Status: Nasal cannula History of Recent Intubation: No Behavior/Cognition: Alert;Cooperative Oral Cavity Assessment: Within Functional Limits Oral Care Completed by SLP: No Oral Cavity - Dentition: Other (Comment);Missing  dentition Vision: Functional for self-feeding Self-Feeding Abilities: Able to feed self Patient Positioning: Upright in bed Baseline Vocal Quality: Low vocal intensity;Suspected CN X (Vagus) involvement Volitional Cough: Strong(productive at times)    Oral/Motor/Sensory Function Overall Oral Motor/Sensory Function: Other (comment)(? mild right facial asymmetry)   Ice Chips Ice chips: Not tested   Thin Liquid Thin Liquid: Impaired Presentation: Straw;Self Fed Pharyngeal  Phase Impairments: Cough - Immediate Other Comments: excessive coughing post-swallow - with wet cough and respiratory discomfort    Nectar Thick Nectar Thick Liquid: Not tested Other Comments: pt reports Ensure makes her cough severely -    Honey Thick Honey Thick Liquid: Not tested   Puree Puree: Impaired Presentation: Self Fed;Spoon Pharyngeal Phase Impairments: Throat Clearing - Delayed;Cough - Delayed Other Comments: pt coughing and expectorating viscous secretions  -  also ? if mixed with po from breakfast today?    Solid     Solid: Not tested      Macario Golds 01/18/2019,12:36 PM   [name], [name] SLP [place] Pager [phone] Office [phone]

## 2019-01-18 NOTE — Progress Notes (Signed)
PROGRESS NOTE    Faith [name]  WNU:[id number] DOB: 1951-07-20 DOA: 01/09/2019 PCP: [name], MD   Brief Narrative:68 y.o.femalewith medical history significant ofstage IV lung cancer diagnosed a year ago undergoing chemotherapy and radiation, chronic abdominal pain and lower back pain, bipolar disorder comes in with a couple of days of coughing that has been productive and nonbloody with associated subjective fevers and chills at home. Patient also reports shortness of breath. She was getting radiation today when they noticed her oxygen sats were a little low so they sent her to the emergency department for further evaluation for PE. Patient denies any lower extremity swelling or edema. She denies chest pain but does endorse right upper quadrant abdominal pain which is been present since her cancer is been found with mets. Patient found to have postobstructive pneumonia and referred for admission for such. She was hospitalized November but has not had recent a biotics. She is being covered with cefepime  Assessment & Plan:   Principal Problem:   HCAP (healthcare-associated pneumonia) Active Problems:   Right sided abdominal pain   Essential hypertension   Non-small cell carcinoma of right lung, stage 4 (HCC)   Palliative care encounter   Chronic pain   Malnutrition of moderate degree   Malignant neoplasm of right lung (HCC)  #1 stage IV lung cancer with mediastinal and hilar node involvement ongoing chemotherapy and radiation-CT of the chest shows large necrotic neoplasm at the inferior right pulmonary hilum invading the mediastinum 6 x 3.9 cm with increased central necrosis and new cavitation since prior study. Increased atelectasis and consolidation of the right lower lobe and right middle lobe with additional infiltrate in the superior segment of the right lower lobe which could represent postobstructive pneumonitis or lymphocytic tumor spread. inCreased right pleural  effusion at least partially loculated.MRSA PCR negative  DC vancomycin and continue cefepime.Patient was started on radiation this week.Patient continued on palliative XRT while in hospital.  Appreciate palliative care input.  Patient would like to have XRT done and get treated for pneumonia and to be discharged to skilled nursing facility.  Discussed with patient and daughter. Patient continues to have a lot of cough with increased sputum production and coughs when she eats.  Speech therapy evaluation pending.  Chest x-ray shows right pleural effusion and basilar atelectasis slightly increased since 2/12.  Follow-up chest x-ray in a.m.  #2 hypertension blood pressure still soft continue to hold antihypertensives.   #3 constipation continue stool softeners.  She has not had a BM since admission even though she is on stool softeners.  Will try lactulose Dulcolax suppository and MiraLAX.  #4 severe deconditioning patient was seen by physical therapy recommends SNF on discharge.  #5 anemia patient has chronic anemia at baseline.  This is probably secondary to hemodilution.  However will transfuse her 1 unit of packed RBC.  Her hemoglobin after 1 unit of blood transfusion is 9.3  DVT prophylaxis:SCD Code Statusdnr Family Communication:Discussed with daughter Charisse March Disposition Plan: Pending clinical improvement  Consultants:  Oncologyradiation oncology  Procedures:None Antimicrobialscefepime    Nutrition Problem: Moderate Malnutrition Etiology: chronic illness(Stage 4 Lung cancer)     Signs/Symptoms: moderate muscle depletion, moderate fat depletion, severe muscle depletion    Interventions: Ensure Enlive (each supplement provides 350kcal and 20 grams of protein)  Estimated body mass index is 21.54 kg/m as calculated from the following:   Height as of this encounter: 5\' 1"  (1.549 m).   Weight as of this encounter: 51.7 kg.  Subjective: Patient continues  to complain of a lot of cough and thick phlegm which she is suctioning by herself associated with coughing when eating patient has history of stroke to be seen by speech therapy today  Objective: Vitals:   01/17/19 1458 01/17/19 1651 01/17/19 2141 01/18/19 0558  BP: (!) 87/52 114/64 121/66 (!) 140/58  Pulse: 76 73 89 80  Resp: 16 16 (!) 22 16  Temp: 98.3 F (36.8 C) 98.3 F (36.8 C) 99 F (37.2 C) 98.5 F (36.9 C)  TempSrc: Oral Oral Oral Oral  SpO2: 97% 100% 100% 100%  Weight:      Height:        Intake/Output Summary (Last 24 hours) at 01/18/2019 0952 Last data filed at 01/18/2019 0930 Gross per 24 hour  Intake 1083.74 ml  Output -  Net 1083.74 ml   Filed Weights   01/10/2019 1940  Weight: 51.7 kg    Examination:  General exam: Appears calm and comfortable  Respiratory system: Coarse breath sounds to auscultation. Respiratory effort normal. Cardiovascular system: S1 & S2 heard, RRR. No JVD, murmurs, rubs, gallops or clicks. No pedal edema. Gastrointestinal system: Abdomen is nondistended, soft and nontender. No organomegaly or masses felt. Normal bowel sounds heard. Central nervous system: Alert and oriented. No focal neurological deficits. Extremities: Symmetric 5 x 5 power. Skin: No rashes, lesions or ulcers Psychiatry: Judgement and insight appear normal. Mood & affect appropriate.     Data Reviewed: I have personally reviewed following labs and imaging studies  CBC: Recent Labs  Lab 01/23/2019 1441 01/13/19 0540 01/14/19 0500 01/16/19 0625 01/18/19 0606  WBC 11.0* 9.4 9.1 9.5 13.6*  NEUTROABS  --  7.4  --   --   --   HGB 9.7* 8.5* 8.2* 7.1* 9.3*  HCT 28.1* 24.3* 24.0* 21.0* 27.6*  MCV 84.1 83.5 81.4 81.4 85.2  PLT 206 198 241 299 638   Basic Metabolic Panel: Recent Labs  Lab 01/09/2019 1441 01/13/19 0540 01/14/19 0500 01/16/19 0625  NA 132* 137 135 139  K 4.0 3.4* 3.6 3.7  CL 102 110 108 113*  CO2 22 23 21* 22  GLUCOSE 94 94 106* 119*  BUN 30*  24* 17 13  CREATININE 0.75 0.56 0.43* 0.48  CALCIUM 10.8* 10.2 10.2 10.2   GFR: Estimated Creatinine Clearance: 52.2 mL/min (by C-G formula based on SCr of 0.48 mg/dL). Liver Function Tests: Recent Labs  Lab 01/14/2019 1441 01/13/19 0540 01/16/19 0625  AST 53* 50* 56*  ALT 23 27 37  ALKPHOS 95 110 133*  BILITOT 3.8* 3.6* 2.0*  PROT 6.8 6.1* 6.0*  ALBUMIN 2.8* 2.6* 2.3*   Recent Labs  Lab 01/13/2019 1441  LIPASE 19   No results for input(s): AMMONIA in the last 168 hours. Coagulation Profile: No results for input(s): INR, PROTIME in the last 168 hours. Cardiac Enzymes: Recent Labs  Lab 01/23/2019 1441  TROPONINI <0.03   BNP (last 3 results) No results for input(s): PROBNP in the last 8760 hours. HbA1C: No results for input(s): HGBA1C in the last 72 hours. CBG: Recent Labs  Lab 01/03/2019 1523  GLUCAP 90   Lipid Profile: No results for input(s): CHOL, HDL, LDLCALC, TRIG, CHOLHDL, LDLDIRECT in the last 72 hours. Thyroid Function Tests: No results for input(s): TSH, T4TOTAL, FREET4, T3FREE, THYROIDAB in the last 72 hours. Anemia Panel: No results for input(s): VITAMINB12, FOLATE, FERRITIN, TIBC, IRON, RETICCTPCT in the last 72 hours. Sepsis Labs: Recent Labs  Lab 01/23/2019 1442  LATICACIDVEN 1.3  Recent Results (from the past 240 hour(s))  Blood culture (routine x 2)     Status: None   Collection Time: 01/03/2019  2:38 PM  Result Value Ref Range Status   Specimen Description   Final    BLOOD LEFT CHEST Performed at [facility] [address] [place], [place] [address]    Special Requests   Final    BOTTLES DRAWN AEROBIC AND ANAEROBIC Blood Culture adequate volume Performed at [facility] [address], [place], [place] [address]    Culture   Final    NO GROWTH 5 DAYS Performed at [facility], [place] [address], [place], [place] [address]    Report Status 01/17/2019 FINAL  Final  Blood culture (routine x 2)      Status: None   Collection Time: 01/09/2019  3:28 PM  Result Value Ref Range Status   Specimen Description   Final    BLOOD RIGHT FOREARM Performed at [facility] [address] [place], [place] [address]    Special Requests   Final    BOTTLES DRAWN AEROBIC AND ANAEROBIC Blood Culture adequate volume Performed at [facility] [address], [place], [place] [address]    Culture   Final    NO GROWTH 5 DAYS Performed at [facility], [place] [address], [place], [place] [address]    Report Status 01/17/2019 FINAL  Final  Respiratory Panel by PCR     Status: None   Collection Time: 01/13/19  8:55 AM  Result Value Ref Range Status   Adenovirus NOT DETECTED NOT DETECTED Final   Coronavirus 229E NOT DETECTED NOT DETECTED Final    Comment: (NOTE) The Coronavirus on the Respiratory Panel, DOES NOT test for the novel  Coronavirus (2019 nCoV)    Coronavirus HKU1 NOT DETECTED NOT DETECTED Final   Coronavirus NL63 NOT DETECTED NOT DETECTED Final   Coronavirus OC43 NOT DETECTED NOT DETECTED Final   Metapneumovirus NOT DETECTED NOT DETECTED Final   Rhinovirus / Enterovirus NOT DETECTED NOT DETECTED Final   Influenza A NOT DETECTED NOT DETECTED Final   Influenza B NOT DETECTED NOT DETECTED Final   Parainfluenza Virus 1 NOT DETECTED NOT DETECTED Final   Parainfluenza Virus 2 NOT DETECTED NOT DETECTED Final   Parainfluenza Virus 3 NOT DETECTED NOT DETECTED Final   Parainfluenza Virus 4 NOT DETECTED NOT DETECTED Final   Respiratory Syncytial Virus NOT DETECTED NOT DETECTED Final   Bordetella pertussis NOT DETECTED NOT DETECTED Final   Chlamydophila pneumoniae NOT DETECTED NOT DETECTED Final   Mycoplasma pneumoniae NOT DETECTED NOT DETECTED Final    Comment: Performed at [facility], [place] [address] [place], [place] [address]  MRSA PCR Screening     Status: None   Collection Time: 01/13/19  8:55 AM  Result Value Ref Range Status   MRSA by PCR  NEGATIVE NEGATIVE Final    Comment:        The GeneXpert MRSA Assay (FDA approved for NASAL specimens only), is one component of a comprehensive MRSA colonization surveillance program. It is not intended to diagnose MRSA infection nor to guide or monitor treatment for MRSA infections. Performed at [facility], [place] [address], [place], [place] [address]          Radiology Studies: Dg Chest 1 View  Result Date: 01/17/2019 CLINICAL DATA:  Hypoxia, stage IV lung cancer EXAM: CHEST  1 VIEW COMPARISON:  Portable  exam 1622 hours compared to 01/15/2019 FINDINGS: LEFT jugular Port-A-Cath with tip projecting over SVC. Enlargement of cardiac silhouette with pulmonary vascular congestion. Scratch in RIGHT pleural effusion and basilar atelectasis slightly increased, underlying abnormalities at lower RIGHT lung not excluded. LEFT lung clear. No pneumothorax. Bones demineralized. Calcified loose body inferior to the LEFT shoulder joint again seen. IMPRESSION: RIGHT pleural effusion and basilar atelectasis, slightly increased since 01/25/2019. Electronically Signed   By: Lavonia Dana M.D.   On: 01/17/2019 16:50        Scheduled Meds: . bisacodyl  10 mg Oral Daily  . clopidogrel  75 mg Oral Daily  . feeding supplement (ENSURE ENLIVE)  237 mL Oral BID BM  . fluticasone  1 spray Each Nare Daily  . gabapentin  300 mg Oral TID  . loratadine  10 mg Oral Daily  . lubiprostone  24 mcg Oral BID WC  . potassium chloride  40 mEq Oral Daily  . senna-docusate  3 tablet Oral QHS  . sodium chloride flush  3 mL Intravenous Q12H  . tiZANidine  4 mg Oral BID   Continuous Infusions: . sodium chloride 10 mL/hr at 01/18/19 0300  . ceFEPime (MAXIPIME) IV 1 g (01/18/19 0351)     LOS: 6 days      Georgette Shell, MD [facility]  If 7PM-7AM, please contact night-coverage [email or web address] Password [facility] 01/18/2019, 9:52 AM

## 2019-01-19 ENCOUNTER — Inpatient Hospital Stay (HOSPITAL_COMMUNITY): Payer: Medicare Other

## 2019-01-19 ENCOUNTER — Ambulatory Visit
Admission: RE | Admit: 2019-01-19 | Discharge: 2019-01-19 | Disposition: A | Discharge status: Home / Self Care | Payer: Medicare Other | Source: Ambulatory Visit | Attending: Radiation Oncology | Admitting: Radiation Oncology

## 2019-01-19 ENCOUNTER — Ambulatory Visit: Payer: Medicare Other

## 2019-01-19 DIAGNOSIS — K5900 Constipation, unspecified: Secondary | ICD-10-CM

## 2019-01-19 DIAGNOSIS — E876 Hypokalemia: Secondary | ICD-10-CM

## 2019-01-19 LAB — COMPREHENSIVE METABOLIC PANEL
ALT: 28 U/L (ref 0–44)
AST: 21 U/L (ref 15–41)
Albumin: 2 g/dL — ABNORMAL LOW (ref 3.5–5.0)
Alkaline Phosphatase: 96 U/L (ref 38–126)
Anion gap: 3 — ABNORMAL LOW (ref 5–15)
BUN: 13 mg/dL (ref 8–23)
CO2: 26 mmol/L (ref 22–32)
Calcium: 9.3 mg/dL (ref 8.9–10.3)
Chloride: 112 mmol/L — ABNORMAL HIGH (ref 98–111)
Creatinine, Ser: 0.37 mg/dL — ABNORMAL LOW (ref 0.44–1.00)
GFR calc Af Amer: >60 mL/min (ref >60)
GFR calc non Af Amer: >60 mL/min (ref >60)
Glucose, Bld: 118 mg/dL — ABNORMAL HIGH (ref 70–99)
Potassium: 3 mmol/L — ABNORMAL LOW (ref 3.5–5.1)
Sodium: 141 mmol/L (ref 135–145)
Total Bilirubin: 1.6 mg/dL — ABNORMAL HIGH (ref 0.3–1.2)
Total Protein: 5.8 g/dL — ABNORMAL LOW (ref 6.5–8.1)

## 2019-01-19 LAB — CBC WITH DIFFERENTIAL/PLATELET
Abs Immature Granulocytes: 0.17 10*3/uL — ABNORMAL HIGH (ref 0.00–0.07)
Basophils Absolute: 0 10*3/uL (ref 0.0–0.1)
Basophils Relative: 0 %
Eosinophils Absolute: 0 10*3/uL (ref 0.0–0.5)
Eosinophils Relative: 0 %
HEMATOCRIT: 25.4 % — AB (ref 36.0–46.0)
Hemoglobin: 8.5 g/dL — ABNORMAL LOW (ref 12.0–15.0)
Immature Granulocytes: 1 %
Lymphocytes Relative: 4 %
Lymphs Abs: 0.6 10*3/uL — ABNORMAL LOW (ref 0.7–4.0)
MCH: 28.7 pg (ref 26.0–34.0)
MCHC: 33.5 g/dL (ref 30.0–36.0)
MCV: 85.8 fL (ref 80.0–100.0)
MONO ABS: 0.8 10*3/uL (ref 0.1–1.0)
MONOS PCT: 5 %
Neutro Abs: 13.3 10*3/uL — ABNORMAL HIGH (ref 1.7–7.7)
Neutrophils Relative %: 90 %
Platelets: 267 10*3/uL (ref 150–400)
RBC: 2.96 MIL/uL — ABNORMAL LOW (ref 3.87–5.11)
RDW: 16.4 % — ABNORMAL HIGH (ref 11.5–15.5)
WBC: 14.8 10*3/uL — ABNORMAL HIGH (ref 4.0–10.5)
nRBC: 0 % (ref 0.0–0.2)

## 2019-01-19 LAB — MAGNESIUM: Magnesium: 1.7 mg/dL (ref 1.7–2.4)

## 2019-01-19 MED ORDER — SORBITOL 70 % SOLN
960.0000 mL | TOPICAL_OIL | Freq: Once | ORAL | Status: AC
  Administered 2019-01-19: 960 mL via RECTAL
  Filled 2019-01-19: qty 473

## 2019-01-19 MED ORDER — POTASSIUM CHLORIDE 20 MEQ PO PACK
40.0000 meq | PACK | Freq: Once | ORAL | Status: AC
  Administered 2019-01-19: 40 meq via ORAL
  Filled 2019-01-19: qty 2

## 2019-01-19 MED ORDER — GUAIFENESIN ER 600 MG PO TB12
1200.0000 mg | ORAL_TABLET | Freq: Two times a day (BID) | ORAL | Status: DC
  Administered 2019-01-19 – 2019-01-20 (×3): 1200 mg via ORAL
  Filled 2019-01-19 (×4): qty 2

## 2019-01-19 MED ORDER — MAGNESIUM SULFATE 4 GM/100ML IV SOLN
4.0000 g | Freq: Once | INTRAVENOUS | Status: AC
  Administered 2019-01-19: 4 g via INTRAVENOUS
  Filled 2019-01-19: qty 100

## 2019-01-19 NOTE — Progress Notes (Signed)
Modified Barium Swallow Progress Note  Patient Details  Name: Faith [name] MRN: [id number] Date of Birth: 1951/09/12  Today's Date: 01/19/2019  Modified Barium Swallow completed.  Full report located under Chart Review in the Imaging Section.  Brief recommendations include the following:  Clinical Impression  Patient presents with inconsistent swallow function and airway protection.  Pt with oropharyngeal dysphagia with sensorimotor deficits.  She demonstrates decreased oral control due to weakness resulting in lingual pumping, decreased bolus cohesion, premature spillage and delayed transiting with solids/puree.  Oral residuals prematurely spill into pharynx with delayed swallow at 4 seconds at pyriform sinus with thin to clear.    Pt aspirated nectar barium oral residuals that as she coughed (and brought up secretions mixed with barium from trachea) she then inhaled GROSSLY aspirating barium retained in pharynx *near vallecular region.  Pt did not clear aspirates despite effortful cough, although she was able to expectorate copious secretions that were mixed with barium.  Pt's gross aspiration produced excessively strong coughing and thus dyspnea with pt requiring several minutes to recover.   With puree, pt with 4 second delay with initial swallow trigger at vallecular region.  Oral residuals of puree spilled into pharynx at tongue base and were not swallowed despite effort after 9 seconds. Pt did cough and expectorate pudding barium from vallecular region prior to SLP providing nectar liquid to aid clearance.   Her swallow function was inconsistent however decreasing bolus size to tsps and VERY SMALL sips helped to protect her airway.  Chin tuck posture not recommended due to her pyriform residuals concerning for spillage into open airway- though this did not occur with testing chin tuck.    Pt clearly states desires to eat/drink and aspiration mitigation with this pt who has Stage IV cancer  seems appropriate for her.  Adequacy of nutrition remains a concern due to level of dysphagia and effort required to consume po.  Recommend to consider dys3/nectar diet - with pt following bite of food with VERY SMALL SIP OR TSP of liquid.   Recommend pt be able to consume thin liquids between meals.  Pt will likely aspirate even with these precautions, but this will likely protect her airway better and thus increase comfort with po (decrease coughing).  Recommend to consider crushing her medications if not contraindicated and provide with puree.  Will follow up for pt/family education to help mitigate dysphagia/aspiration.  Thanks for this consult.     Swallow Evaluation Recommendations       SLP Diet Recommendations: Dysphagia 3 (Mech soft) solids;Nectar thick liquid(thin ok between meals )   Liquid Administration via: Cup;Spoon   Medication Administration: Crushed with puree   Supervision: Patient able to self feed   Compensations: Slow rate;Small sips/bites;Other (Comment);Follow solids with liquid(VERY SMALL SIP!!!!!!!)   Postural Changes: Seated upright at 90 degrees;Remain semi-upright after after feeds/meals (Comment)   Oral Care Recommendations: Oral care before and after PO   Other Recommendations: Have oral suction available    Macario Golds 01/19/2019,10:05 AM [name], [place] [facility] SLP [place] Pager [phone]

## 2019-01-19 NOTE — Progress Notes (Signed)
Disimpaction done with minimal result followed by SMOG enema. Hard stool felt with liquid stool oozing around it. Dr Grandville Silos notified.

## 2019-01-19 NOTE — Progress Notes (Addendum)
PROGRESS NOTE    Faith [name]  IWL:[id number] DOB: 21-Mar-1951 DOA: 01/23/2019 PCP: [name], MD   Brief Narrative:  :68 y.o.femalewith medical history significant ofstage IV lung cancer diagnosed a year ago undergoing chemotherapy and radiation, chronic abdominal pain and lower back pain, bipolar disorder comes in with a couple of days of coughing that has been productive and nonbloody with associated subjective fevers and chills at home. Patient also reports shortness of breath. She was getting radiation today when they noticed her oxygen sats were a little low so they sent her to the emergency department for further evaluation for PE. Patient denies any lower extremity swelling or edema. She denies chest pain but does endorse right upper quadrant abdominal pain which is been present since her cancer is been found with mets. Patient found to have postobstructive pneumonia and referred for admission for such. She was hospitalized November but has not had recent a biotics. She is being covered with cefepime   Assessment & Plan:   Principal Problem:   HCAP (healthcare-associated pneumonia) Active Problems:   Right sided abdominal pain   Essential hypertension   Non-small cell carcinoma of right lung, stage 4 (HCC)   Palliative care encounter   Chronic pain   Malnutrition of moderate degree   Malignant neoplasm of right lung ([place])   1 stage IV lung cancer with mediastinal and hilar node involvement/postobstructive pneumonitis Patient with ongoing chemotherapy and radiation.  CT chest showed a large necrotic neoplasm at the inferior right pulmonary hilum invading mediastinum 6 x 3.9 cm with increased central necrosis and new cavitation since prior study.  Increased atelectasis and consolidation of the right lower lobe, right middle lobe and additional infiltrate in the superior segment of the right lower lobe could represent postobstructive pneumonitis or lymphocytic tumor  spread.  Increased right pleural effusion at least partially loculated. MRSA PCR was negative.  Vancomycin was discontinued and patient currently on IV cefepime.  Patient noted to have started radiation this week.  Patient with continued palliative radiation in-house.  Patient would like to have radiation done and get treated for her pneumonia and be discharged to a skilled nursing facility and then reassess.  Patient noted to have lots of cough with increased sputum production and coughing when she eats and as such speech therapy consulted.  Patient started on a dysphagia 3 diet.  Aspiration precautions.  Place on Mucinex. follow.  Repeat chest x-ray pending.  2.  Right pleural effusion Patient with progressive stage IV lung cancer with mediastinal and hilar node involvement.  CT chest and chest x-ray which was done consistent with increasing right pleural effusion.  Repeat chest x-ray.  If pleural effusion is worsening will need to discuss with pulmonary for further evaluation and recommendations.  Continue IV antibiotics for now.  Follow.  3.  Hypertension Continue to hold antihypertensive medications.  4.  Constipation Patient stated has not had a bowel movement since admission although she is on stool softeners.  Patient placed on a Dulcolax suppository and given a smog enema overnight and on MiraLAX however no bowel movement.  Increase MiraLAX to twice daily. Continue amitiza.  Manual disimpaction.  Repeat smog enema.  Continue Senokot-S.  Follow.  5.  Severe deconditioning PT/OT.  Needs SNF on discharge.  6.  Anemia of chronic disease 1 unit packed red blood cells 01/17/2019.  Hemoglobin currently at 8.5.  Check an anemia panel.  Follow H&H.  7.  Hypokalemia/hypomagnesemia Replete.  8.  Protein calorie malnutrition  Nutritional supplementation.   DVT prophylaxis: SCD Code Status: DNR Family Communication: Updated patient.  No family at bedside. Disposition Plan: To be  determined.   Consultants:   Palliative care Dr. Domingo Cocking 01/15/2019  Procedures:   CT abdomen and pelvis 01/01/2019  CT angiogram chest 01/03/2019  Chest x-ray 01/14/2019, 01/17/2019, 01/19/2019  Antimicrobials:  IV cefepime 01/13/2019>>>> 01/21/2019  IV vancomycin 01/13/2019>>>> 01/14/2019   Subjective: Sleeping however easily arousable.  Patient complained of feeling constipated.  Denies any shortness of breath.  Denies any chest pain.  Patient states she got a suppository yesterday with no results.  A smog enema last night/early this morning with no significant results.  Objective: Vitals:   01/18/19 0558 01/18/19 1411 01/18/19 2059 01/19/19 0600  BP: (!) 140/58 105/61 128/68 113/65  Pulse: 80 71 (!) 101 78  Resp: 16 16 20 20   Temp: 98.5 F (36.9 C) 98.3 F (36.8 C) 99.3 F (37.4 C) 99.5 F (37.5 C)  TempSrc: Oral Oral Oral Oral  SpO2: 100% 100% 99% 100%  Weight:      Height:        Intake/Output Summary (Last 24 hours) at 01/19/2019 1309 Last data filed at 01/19/2019 0945 Gross per 24 hour  Intake -  Output 4 ml  Net -4 ml   Filed Weights   01/18/2019 1940  Weight: 51.7 kg    Examination:  General exam: Appears calm and comfortable  Respiratory system: Clear to auscultation. Respiratory effort normal. Cardiovascular system: S1 & S2 heard, RRR. No JVD, murmurs, rubs, gallops or clicks. No pedal edema. Gastrointestinal system: Abdomen is nondistended, soft and nontender. No organomegaly or masses felt. Normal bowel sounds heard. Central nervous system: Alert and oriented. No focal neurological deficits. Extremities: Symmetric 5 x 5 power. Skin: No rashes, lesions or ulcers Psychiatry: Judgement and insight appear normal. Mood & affect appropriate.     Data Reviewed: I have personally reviewed following labs and imaging studies  CBC: Recent Labs  Lab 01/13/19 0540 01/14/19 0500 01/16/19 0625 01/18/19 0606 01/19/19 1135  WBC 9.4 9.1 9.5 13.6* 14.8*   NEUTROABS 7.4  --   --   --  13.3*  HGB 8.5* 8.2* 7.1* 9.3* 8.5*  HCT 24.3* 24.0* 21.0* 27.6* 25.4*  MCV 83.5 81.4 81.4 85.2 85.8  PLT 198 241 299 278 740   Basic Metabolic Panel: Recent Labs  Lab 01/05/2019 1441 01/13/19 0540 01/14/19 0500 01/16/19 0625 01/19/19 1135  NA 132* 137 135 139 141  K 4.0 3.4* 3.6 3.7 3.0*  CL 102 110 108 113* 112*  CO2 22 23 21* 22 26  GLUCOSE 94 94 106* 119* 118*  BUN 30* 24* 17 13 13   CREATININE 0.75 0.56 0.43* 0.48 0.37*  CALCIUM 10.8* 10.2 10.2 10.2 9.3  MG  --   --   --   --  1.7   GFR: Estimated Creatinine Clearance: 52.2 mL/min (A) (by C-G formula based on SCr of 0.37 mg/dL (L)). Liver Function Tests: Recent Labs  Lab 01/08/2019 1441 01/13/19 0540 01/16/19 0625 01/19/19 1135  AST 53* 50* 56* 21  ALT 23 27 37 28  ALKPHOS 95 110 133* 96  BILITOT 3.8* 3.6* 2.0* 1.6*  PROT 6.8 6.1* 6.0* 5.8*  ALBUMIN 2.8* 2.6* 2.3* 2.0*   Recent Labs  Lab 01/25/2019 1441  LIPASE 19   No results for input(s): AMMONIA in the last 168 hours. Coagulation Profile: No results for input(s): INR, PROTIME in the last 168 hours. Cardiac Enzymes: Recent Labs  Lab 01/08/2019 1441  TROPONINI <0.03   BNP (last 3 results) No results for input(s): PROBNP in the last 8760 hours. HbA1C: No results for input(s): HGBA1C in the last 72 hours. CBG: Recent Labs  Lab 01/21/2019 1523  GLUCAP 90   Lipid Profile: No results for input(s): CHOL, HDL, LDLCALC, TRIG, CHOLHDL, LDLDIRECT in the last 72 hours. Thyroid Function Tests: No results for input(s): TSH, T4TOTAL, FREET4, T3FREE, THYROIDAB in the last 72 hours. Anemia Panel: No results for input(s): VITAMINB12, FOLATE, FERRITIN, TIBC, IRON, RETICCTPCT in the last 72 hours. Sepsis Labs: Recent Labs  Lab 01/16/2019 1442  LATICACIDVEN 1.3    Recent Results (from the past 240 hour(s))  Blood culture (routine x 2)     Status: None   Collection Time: 01/04/2019  2:38 PM  Result Value Ref Range Status   Specimen  Description   Final    BLOOD LEFT CHEST Performed at [facility] [address] [place], [place] [address]    Special Requests   Final    BOTTLES DRAWN AEROBIC AND ANAEROBIC Blood Culture adequate volume Performed at [facility] [address], [place], [place] [address]    Culture   Final    NO GROWTH 5 DAYS Performed at [facility], [place] [address], [place], [place] [address]    Report Status 01/17/2019 FINAL  Final  Blood culture (routine x 2)     Status: None   Collection Time: 01/28/2019  3:28 PM  Result Value Ref Range Status   Specimen Description   Final    BLOOD RIGHT FOREARM Performed at [facility] [address] [place], [place] [address]    Special Requests   Final    BOTTLES DRAWN AEROBIC AND ANAEROBIC Blood Culture adequate volume Performed at [facility] [address], [place], [place] [address]    Culture   Final    NO GROWTH 5 DAYS Performed at [facility], [place] [address], [place], [place] [address]    Report Status 01/17/2019 FINAL  Final  Respiratory Panel by PCR     Status: None   Collection Time: 01/13/19  8:55 AM  Result Value Ref Range Status   Adenovirus NOT DETECTED NOT DETECTED Final   Coronavirus 229E NOT DETECTED NOT DETECTED Final    Comment: (NOTE) The Coronavirus on the Respiratory Panel, DOES NOT test for the novel  Coronavirus (2019 nCoV)    Coronavirus HKU1 NOT DETECTED NOT DETECTED Final   Coronavirus NL63 NOT DETECTED NOT DETECTED Final   Coronavirus OC43 NOT DETECTED NOT DETECTED Final   Metapneumovirus NOT DETECTED NOT DETECTED Final   Rhinovirus / Enterovirus NOT DETECTED NOT DETECTED Final   Influenza A NOT DETECTED NOT DETECTED Final   Influenza B NOT DETECTED NOT DETECTED Final   Parainfluenza Virus 1 NOT DETECTED NOT DETECTED Final   Parainfluenza Virus 2 NOT DETECTED NOT DETECTED Final   Parainfluenza Virus 3 NOT DETECTED NOT  DETECTED Final   Parainfluenza Virus 4 NOT DETECTED NOT DETECTED Final   Respiratory Syncytial Virus NOT DETECTED NOT DETECTED Final   Bordetella pertussis NOT DETECTED NOT DETECTED Final   Chlamydophila pneumoniae NOT DETECTED NOT DETECTED Final   Mycoplasma pneumoniae NOT DETECTED NOT DETECTED Final    Comment: Performed at [facility], [place] [address] [place], [place] [address]  MRSA PCR Screening     Status: None   Collection Time: 01/13/19  8:55 AM  Result Value Ref Range Status   MRSA by PCR NEGATIVE NEGATIVE Final    Comment:        The GeneXpert MRSA Assay (FDA approved for NASAL specimens only), is one component of a comprehensive MRSA colonization surveillance program. It is not intended to diagnose MRSA infection nor to guide or monitor treatment for MRSA infections. Performed at [facility], [place] [address], [place], [place] [address]          Radiology Studies: Dg Chest 1 View  Result Date: 01/17/2019 CLINICAL DATA:  Hypoxia, stage IV lung cancer EXAM: CHEST  1 VIEW COMPARISON:  Portable exam 1622 hours compared to 01/01/2019 FINDINGS: LEFT jugular Port-A-Cath with tip projecting over SVC. Enlargement of cardiac silhouette with pulmonary vascular congestion. Scratch in RIGHT pleural effusion and basilar atelectasis slightly increased, underlying abnormalities at lower RIGHT lung not excluded. LEFT lung clear. No pneumothorax. Bones demineralized. Calcified loose body inferior to the LEFT shoulder joint again seen. IMPRESSION: RIGHT pleural effusion and basilar atelectasis, slightly increased since 01/07/2019. Electronically Signed   By: Lavonia Dana M.D.   On: 01/17/2019 16:50   Dg Swallowing Func-speech Pathology  Result Date: 01/19/2019 Objective Swallowing Evaluation: Type of Study: MBS-Modified Barium Swallow Study  Patient Details Name: Faith [name] MRN: [id number] Date of Birth: 09-17-1951 Today's Date: 01/19/2019 Time: SLP Start Time  (ACUTE ONLY): 0810 -SLP Stop Time (ACUTE ONLY): 0850 SLP Time Calculation (min) (ACUTE ONLY): 40 min Past Medical History: Past Medical History: Diagnosis Date . Anxiety  . Arthritis   "thighs; legs; hips" (07/05/2014) . Bipolar disorder ([place])  . Cancer ([place])  . Cholelithiasis 07/2014 . Chronic bronchitis ([place])   "get it q yr" . Chronic lower back pain  . Depression   pt. use to go to depression clinic, states she still has depression & anxiety but can't get to appt. so she hasn't had any med. for it in a while  . Dyspnea  . GERD (gastroesophageal reflux disease)  . High cholesterol  . History of radiation therapy 02/01/18- 02/12/18  Right Neck/ 30 Gy in 10 fractions.  . Hypertension  . Mass in neck 12/2017  RIGHT SIDE OF NECK . Peripheral vascular disease ([place])  . Schizophrenia ([place])  . Stroke [facility])   caused left eye blindness Past Surgical History: Past Surgical History: Procedure Laterality Date . ABDOMINAL AORTOGRAM N/A 01/07/2017  Procedure: Abdominal Aortogram;  Surgeon: Waynetta Sandy, MD;  Location: [facility];  Service: Cardiovascular;  Laterality: N/A; . ABDOMINAL HYSTERECTOMY   . ANKLE FRACTURE SURGERY Right  . ANKLE HARDWARE REMOVAL Right  . APPENDECTOMY  ~ 1963 . BREAST BIOPSY Bilateral  . BREAST CYST EXCISION Bilateral   "not cancer" . CATARACT EXTRACTION W/ INTRAOCULAR LENS  IMPLANT, BILATERAL   . CHOLECYSTECTOMY N/A 07/05/2014  Procedure: LAPAROSCOPIC CHOLECYSTECTOMY WITH INTRAOPERATIVE CHOLANGIOGRAM;  Surgeon: Gayland Curry, MD;  Location: [facility];  Service: General;  Laterality: N/A; . EXCISIONAL HEMORRHOIDECTOMY   . FEMORAL-POPLITEAL BYPASS GRAFT Right 01/15/2017  Procedure: BYPASS GRAFT FEMORAL-POPLITEAL ARTERY RIGHT LEG;  Surgeon: Waynetta Sandy, MD;  Location: [facility];  Service: Vascular;  Laterality: Right; . IR IMAGING GUIDED PORT INSERTION  03/08/2018 . IR US GUIDE VASC ACCESS LEFT  03/08/2018 . LAPAROSCOPIC CHOLECYSTECTOMY  07/05/2014 . LOWER EXTREMITY ANGIOGRAPHY Bilateral 01/07/2017   Procedure: Lower Extremity Angiography;  Surgeon: Waynetta Sandy, MD;  Location: [facility] CV [facility];  Service: Cardiovascular;  Laterality: Bilateral; . MASS BIOPSY Right 12/25/2017  Procedure: INCISIONAL RIGHT  NECK MASS BIOPSY;  Surgeon: Helayne Seminole, MD;  Location: [facility];  Service: ENT;  Laterality: Right; . MULTIPLE TOOTH EXTRACTIONS  05/2013  "pulled my upper teeth" . PILONIDAL CYST EXCISION   HPI: 68 yo female adm to [facility] with h/o stage IV lung cancer admitted with respiratory deficits.  Pt has undergone chemo and radiation and is currently undergoing palliative radiation.  She has right neck mets per prior imaging study and h/o CVA.  Prior MBS 01/14/2018 showed sensory deficit with delayed swallow- mild deficits..  Pt has been coughing with intake and swallow eval ordered.  Subjective: pt awake in chair, baseline congested cough with expectoration of secretions with use of suction Assessment / Plan / Recommendation CHL IP CLINICAL IMPRESSIONS 01/19/2019 Clinical Impression Patient presents with inconsistent swallow function and airway protection.  Pt with oropharyngeal dysphagia with sensorimotor deficits.  She demonstrates decreased oral control due to weakness resulting in lingual pumping, decreased bolus cohesion, premature spillage and delayed transiting with solids/puree.  Oral residuals prematurely spill into pharynx with delayed swallow at 4 seconds at pyriform sinus with thin to clear.  Pt aspirated nectar barium oral residuals that as she coughed (and brought up secretions mixed with barium from trachea) she then inhaled GROSSLY aspirating barium retained in pharynx *near vallecular region.  Pt did not clear aspirates despite effortful cough, although she was able to expectorate copious secretions that were mixed with barium.  Pt's gross aspiration produced excessively strong coughing and thus dyspnea with pt requiring several minutes to recover.   With puree, pt with 4 second delay with  initial swallow trigger at vallecular region.  Oral residuals of puree spilled into pharynx at tongue base and were not swallowed despite effort after 9 seconds. Pt did cough and expectorate pudding barium from vallecular region prior to SLP providing nectar liquid to aid clearance. Her swallow function was inconsistent however decreasing bolus size to tsps and VERY SMALL sips helped to protect her airway.  Chin tuck posture not recommended due to her pyriform residuals concerning for spillage into open airway- though this did not occur with testing chin tuck.  Pt clearly states desires to eat/drink and aspiration mitigation with this pt who has Stage IV cancer seems appropriate for her.  Adequacy of nutrition remains a concern due to level of dysphagia and effort required to consume po.  Recommend to consider dys3/nectar diet - with pt following bite of food with VERY SMALL SIP OR TSP of liquid.   Recommend pt be able to consume thin liquids between meals.  Pt will likely aspirate even with these precautions, but this will likely protect her airway better and thus increase comfort with po (decrease coughing).  Recommend to consider crushing her medications if not contraindicated and provide with puree.  Will follow up for pt/family education to help mitigate dysphagia/aspiration.  Thanks for this consult.    SLP Visit Diagnosis Dysphagia, oropharyngeal phase (R13.12) Attention and concentration deficit following -- Frontal lobe and executive function deficit following -- Impact on safety and function Moderate aspiration risk;Risk for inadequate nutrition/hydration   CHL IP TREATMENT RECOMMENDATION 01/19/2019 Treatment Recommendations Therapy as outlined in treatment plan below   Prognosis 01/19/2019 Prognosis for Safe Diet Advancement Guarded Barriers to Reach Goals Other (Comment) Barriers/Prognosis Comment -- CHL IP DIET RECOMMENDATION 01/19/2019 SLP Diet Recommendations Dysphagia 3 (Mech soft) solids;Nectar thick  liquid Liquid Administration via Cup;Spoon Medication Administration Crushed with puree Compensations Slow rate;Small sips/bites;Other (Comment);Follow solids with liquid Postural Changes Seated upright  at 90 degrees;Remain semi-upright after after feeds/meals (Comment)   CHL IP OTHER RECOMMENDATIONS 01/19/2019 Recommended Consults -- Oral Care Recommendations Oral care before and after PO Other Recommendations Have oral suction available   CHL IP FOLLOW UP RECOMMENDATIONS 01/18/2019 Follow up Recommendations (No Data)   CHL IP FREQUENCY AND DURATION 01/19/2019 Speech Therapy Frequency (ACUTE ONLY) min 2x/week Treatment Duration 2 weeks      CHL IP ORAL PHASE 01/19/2019 Oral Phase Impaired Oral - Pudding Teaspoon -- Oral - Pudding Cup -- Oral - Honey Teaspoon -- Oral - Honey Cup -- Oral - Nectar Teaspoon Decreased bolus cohesion;Lingual/palatal residue;Weak lingual manipulation;Lingual pumping;Premature spillage Oral - Nectar Cup Premature spillage;Weak lingual manipulation;Lingual pumping;Decreased bolus cohesion;Lingual/palatal residue Oral - Nectar Straw Decreased bolus cohesion;Lingual/palatal residue;Weak lingual manipulation;Lingual pumping;Premature spillage Oral - Thin Teaspoon Decreased bolus cohesion;Lingual pumping;Weak lingual manipulation;Premature spillage;Lingual/palatal residue Oral - Thin Cup Decreased bolus cohesion;Weak lingual manipulation;Lingual pumping;Premature spillage;Lingual/palatal residue Oral - Thin Straw Decreased bolus cohesion;Reduced posterior propulsion;Weak lingual manipulation;Lingual pumping;Premature spillage;Lingual/palatal residue Oral - Puree Decreased bolus cohesion;Delayed oral transit;Weak lingual manipulation;Lingual pumping;Premature spillage;Lingual/palatal residue Oral - Mech Soft Decreased bolus cohesion;Delayed oral transit;Weak lingual manipulation;Impaired mastication;Lingual pumping;Premature spillage Oral - Regular -- Oral - Multi-Consistency -- Oral - Pill -- Oral  Phase - Comment --  CHL IP PHARYNGEAL PHASE 01/19/2019 Pharyngeal Phase Impaired Pharyngeal- Pudding Teaspoon -- Pharyngeal -- Pharyngeal- Pudding Cup -- Pharyngeal -- Pharyngeal- Honey Teaspoon -- Pharyngeal -- Pharyngeal- Honey Cup -- Pharyngeal -- Pharyngeal- Nectar Teaspoon Delayed swallow initiation-pyriform sinuses Pharyngeal -- Pharyngeal- Nectar Cup Delayed swallow initiation-pyriform sinuses;Penetration/Apiration after swallow;Penetration/Aspiration during swallow;Significant aspiration (Amount) Pharyngeal Material enters airway, passes BELOW cords and not ejected out despite cough attempt by patient Pharyngeal- Nectar Straw Delayed swallow initiation-vallecula;Pharyngeal residue - valleculae;Delayed swallow initiation-pyriform sinuses Pharyngeal Material does not enter airway Pharyngeal- Thin Teaspoon WFL Pharyngeal -- Pharyngeal- Thin Cup WFL Pharyngeal -- Pharyngeal- Thin Straw WFL Pharyngeal -- Pharyngeal- Puree Delayed swallow initiation-vallecula;Reduced pharyngeal peristalsis;Pharyngeal residue - valleculae;Reduced epiglottic inversion;Reduced anterior laryngeal mobility Pharyngeal -- Pharyngeal- Mechanical Soft -- Pharyngeal -- Pharyngeal- Regular -- Pharyngeal -- Pharyngeal- Multi-consistency -- Pharyngeal -- Pharyngeal- Pill -- Pharyngeal -- Pharyngeal Comment Oral residuals of thin prematurely spill into pharynx with delayed swallow at 4 seconds at pyriform sinus to clear.  Pt aspirated nectar barium oral residuals that as she coughed (and brought up secretions mixed with barium from trahcea) and then inhaled GROSSLY aspirating barium retained in pharynx *near vallecular region.  Pt did not clear aspirates despite effortful cough,  though she was able to expectorate secretions mixed with barium.   With puree, pt with 4 second delay with swallow trigger at vallecular region.  Oral residuals spilling into pharynx at tongue base were not swallowed despite effort after 9 seconds. Pt did cough and  expectorate pudding barium from vallecular region prior to SlP providing nectar liquid to aid clearance.    CHL IP CERVICAL ESOPHAGEAL PHASE 01/19/2019 Cervical Esophageal Phase Impaired Pudding Teaspoon -- Pudding Cup -- Honey Teaspoon -- Honey Cup -- Nectar Teaspoon -- Nectar Cup -- Nectar Straw -- Thin Teaspoon -- Thin Cup -- Thin Straw -- Puree -- Mechanical Soft -- Regular -- Multi-consistency -- Pill -- Cervical Esophageal Comment Appearance of secretions and barium retained in esophagus, suspect this may contribute to her aspiration risk and pt was not sensate Macario Golds 01/19/2019, 10:07 AM  [name], MS [place] Pager [phone] Office [phone]  Scheduled Meds: . bisacodyl  10 mg Oral Daily  . clopidogrel  75 mg Oral Daily  . feeding supplement (ENSURE ENLIVE)  237 mL Oral BID BM  . fluticasone  1 spray Each Nare Daily  . gabapentin  300 mg Oral TID  . guaiFENesin  1,200 mg Oral BID  . loratadine  10 mg Oral Daily  . lubiprostone  24 mcg Oral BID WC  . polyethylene glycol  17 g Oral Daily  . potassium chloride  40 mEq Oral Once  . potassium chloride  40 mEq Oral Daily  . senna-docusate  3 tablet Oral QHS  . sodium chloride flush  3 mL Intravenous Q12H  . sorbitol, milk of mag, mineral oil, glycerin (SMOG) enema  960 mL Rectal Once  . tiZANidine  4 mg Oral BID   Continuous Infusions: . sodium chloride 250 mL (01/18/19 2128)  . ceFEPime (MAXIPIME) IV 1 g (01/19/19 1241)  . magnesium sulfate 1 - 4 g bolus IVPB       LOS: 7 days    Time spent: 35 minutes    Irine Seal, MD [facility]  If 7PM-7AM, please contact night-coverage [email or web address] 01/19/2019, 1:09 PM

## 2019-01-19 NOTE — Progress Notes (Signed)
SLP Note  Patient Details Name: Faith [name] MRN: [id number] DOB: March 03, 1951          Reason Eval/Treat Not Completed: Other (comment)(pt has had bowel movement per her statement, plan to proceed with MBS this am.  RN informed and MBS order placed.   [name], MS [facility] SLP Acute Rehab Services Pager [phone] Office [phone]    Macario Golds 01/19/2019, 8:01 AM

## 2019-01-19 NOTE — Progress Notes (Signed)
  Speech Language Pathology Treatment: Dysphagia  Patient Details Name: Faith [name] MRN: [id number] DOB: [birth year] Today's Date: 01/19/2019 Time: 4709-6283 SLP Time Calculation (min) (ACUTE ONLY): 31 min  Assessment / Plan / Recommendation Clinical Impression  SlP reviewed in detail MBS study with pt and daughter showing them flouro loops of swallow test.  Using teach back, reviewed reasoning for swallow strategies and dietary modifications.  Explained risk of aspiration and aspiration of secretions occurring that can not be prevented.  Daughter present stated yesterday and confirms today that she "wants to honor mom's wishes" and pt advised she would like to eat despite aspiration risk.  When inquiring re: muscle strengthening exercises for mom, SLP advised that eating will be exercise enough for her due to its effort.  Daughter also confirmed SLP suspicions that pt was coughing some with po for several months prior to admit but states it has worsened recently.  SLP advised likely treatment, deconditioning and illness contributing to worsening dysphagia.    Provided detailed instructions to increase COMFORT with intake by decreasing amount of aspiration. All in agreement to plan - Reviewed that it is IMPERATIVE for pt to take SMALL SINGLE SIPS or she WILL ASPIRATE.  If gross aspiration occurs during meal, advised to cease intake and rest.  Level of dysphagia causes concern for pt to meet nutritional needs but clearly po provides her comfort.    RN states pt with increased dyspnea upon return from xray exam after SLP directly asked SLP  advised this was due to her aspiration.   Will follow up while pt in he hospital.  Thanks.    HPI HPI: 68 yo female adm to [facility] with h/o stage IV lung cancer admitted with respiratory deficits.  Pt has undergone chemo and radiation and is currently undergoing palliative radiation.  She has right neck mets per prior imaging study and h/o CVA.  Prior MBS 01/14/2018  showed sensory deficit with delayed swallow- mild deficits..  Pt has been coughing with intake and swallow eval ordered.       SLP Plan  Continue with current plan of care       Recommendations  Diet recommendations: Dysphagia 3 (mechanical soft);Nectar-thick liquid(thin between meals ok) Liquids provided via: Straw;Cup Medication Administration: (? crush with puree) Supervision: Patient able to self feed;Full supervision/cueing for compensatory strategies Compensations: Slow rate;Small sips/bites;Follow solids with liquid;Other (Comment) Postural Changes and/or Swallow Maneuvers: Seated upright 90 degrees;Upright 30-60 min after meal                Oral Care Recommendations: Oral care prior to ice chip/H20(brush teeth, gums and tongue in am and pm at least - pt aspirating secretions!) Follow up Recommendations: (tbd) SLP Visit Diagnosis: Dysphagia, oropharyngeal phase (R13.12) Plan: Continue with current plan of care       [place], [facility] SLP [place] Pager [phone] Office [phone]  Macario Golds 01/19/2019, 11:23 AM

## 2019-01-20 ENCOUNTER — Inpatient Hospital Stay (HOSPITAL_COMMUNITY): Payer: Medicare Other

## 2019-01-20 ENCOUNTER — Ambulatory Visit: Payer: Medicare Other

## 2019-01-20 DIAGNOSIS — R000 Tachycardia, unspecified: Secondary | ICD-10-CM

## 2019-01-20 DIAGNOSIS — Z7189 Other specified counseling: Secondary | ICD-10-CM

## 2019-01-20 DIAGNOSIS — R9431 Abnormal electrocardiogram [ECG] [EKG]: Secondary | ICD-10-CM

## 2019-01-20 DIAGNOSIS — J9621 Acute and chronic respiratory failure with hypoxia: Secondary | ICD-10-CM

## 2019-01-20 LAB — COMPREHENSIVE METABOLIC PANEL
ALK PHOS: 88 U/L (ref 38–126)
ALT: 24 U/L (ref 0–44)
ANION GAP: 6 (ref 5–15)
AST: 20 U/L (ref 15–41)
Albumin: 2.1 g/dL — ABNORMAL LOW (ref 3.5–5.0)
BUN: 15 mg/dL (ref 8–23)
CALCIUM: 8.8 mg/dL — AB (ref 8.9–10.3)
CO2: 24 mmol/L (ref 22–32)
Chloride: 112 mmol/L — ABNORMAL HIGH (ref 98–111)
Creatinine, Ser: 0.45 mg/dL (ref 0.44–1.00)
GFR calc Af Amer: >60 mL/min (ref >60)
GFR calc non Af Amer: >60 mL/min (ref >60)
Glucose, Bld: 125 mg/dL — ABNORMAL HIGH (ref 70–99)
POTASSIUM: 2.7 mmol/L — AB (ref 3.5–5.1)
Sodium: 142 mmol/L (ref 135–145)
TOTAL PROTEIN: 6 g/dL — AB (ref 6.5–8.1)
Total Bilirubin: 1.7 mg/dL — ABNORMAL HIGH (ref 0.3–1.2)

## 2019-01-20 LAB — ECHOCARDIOGRAM COMPLETE
Height: 61 in
Weight: 1824 oz

## 2019-01-20 LAB — CBC WITH DIFFERENTIAL/PLATELET
ABS IMMATURE GRANULOCYTES: 0.14 10*3/uL — AB (ref 0.00–0.07)
Abs Immature Granulocytes: 0.1 10*3/uL — ABNORMAL HIGH (ref 0.00–0.07)
Basophils Absolute: 0 10*3/uL (ref 0.0–0.1)
Basophils Absolute: 0 10*3/uL (ref 0.0–0.1)
Basophils Relative: 0 %
Basophils Relative: 0 %
EOS PCT: 0 %
EOS PCT: 0 %
Eosinophils Absolute: 0 10*3/uL (ref 0.0–0.5)
Eosinophils Absolute: 0 10*3/uL (ref 0.0–0.5)
HCT: 24.9 % — ABNORMAL LOW (ref 36.0–46.0)
HCT: 29.8 % — ABNORMAL LOW (ref 36.0–46.0)
Hemoglobin: 8.2 g/dL — ABNORMAL LOW (ref 12.0–15.0)
Hemoglobin: 9.3 g/dL — ABNORMAL LOW (ref 12.0–15.0)
Immature Granulocytes: 1 %
Immature Granulocytes: 1 %
LYMPHS PCT: 5 %
Lymphocytes Relative: 2 %
Lymphs Abs: 0.7 10*3/uL (ref 0.7–4.0)
Lymphs Abs: 0.3 10*3/uL — ABNORMAL LOW (ref 0.7–4.0)
MCH: 28.4 pg (ref 26.0–34.0)
MCH: 27.6 pg (ref 26.0–34.0)
MCHC: 32.9 g/dL (ref 30.0–36.0)
MCHC: 31.2 g/dL (ref 30.0–36.0)
MCV: 86.2 fL (ref 80.0–100.0)
MCV: 88.4 fL (ref 80.0–100.0)
MONO ABS: 0.9 10*3/uL (ref 0.1–1.0)
Monocytes Absolute: 0.8 10*3/uL (ref 0.1–1.0)
Monocytes Relative: 7 %
Monocytes Relative: 6 %
NEUTROS ABS: 11.3 10*3/uL — AB (ref 1.7–7.7)
NRBC: 0.2 % (ref 0.0–0.2)
Neutro Abs: 12.6 10*3/uL — ABNORMAL HIGH (ref 1.7–7.7)
Neutrophils Relative %: 87 %
Neutrophils Relative %: 91 %
Platelets: 248 10*3/uL (ref 150–400)
Platelets: 304 10*3/uL (ref 150–400)
RBC: 2.89 MIL/uL — ABNORMAL LOW (ref 3.87–5.11)
RBC: 3.37 MIL/uL — ABNORMAL LOW (ref 3.87–5.11)
RDW: 17.1 % — ABNORMAL HIGH (ref 11.5–15.5)
RDW: 17.2 % — ABNORMAL HIGH (ref 11.5–15.5)
WBC: 13.1 10*3/uL — AB (ref 4.0–10.5)
WBC: 13.8 10*3/uL — ABNORMAL HIGH (ref 4.0–10.5)
nRBC: 0.2 % (ref 0.0–0.2)

## 2019-01-20 LAB — BASIC METABOLIC PANEL
Anion gap: 6 (ref 5–15)
BUN: 17 mg/dL (ref 8–23)
CO2: 28 mmol/L (ref 22–32)
Calcium: 8.7 mg/dL — ABNORMAL LOW (ref 8.9–10.3)
Chloride: 113 mmol/L — ABNORMAL HIGH (ref 98–111)
Creatinine, Ser: 0.52 mg/dL (ref 0.44–1.00)
GFR calc Af Amer: >60 mL/min (ref >60)
GFR calc non Af Amer: >60 mL/min (ref >60)
Glucose, Bld: 166 mg/dL — ABNORMAL HIGH (ref 70–99)
Potassium: 4.1 mmol/L (ref 3.5–5.1)
Sodium: 147 mmol/L — ABNORMAL HIGH (ref 135–145)

## 2019-01-20 LAB — IRON AND TIBC
IRON: 16 ug/dL — AB (ref 28–170)
Saturation Ratios: 13 % (ref 10.4–31.8)
TIBC: 127 ug/dL — ABNORMAL LOW (ref 250–450)
UIBC: 111 ug/dL

## 2019-01-20 LAB — BLOOD GAS, ARTERIAL
Acid-base deficit: 7.3 mmol/L — ABNORMAL HIGH (ref 0.0–2.0)
Bicarbonate: 22.7 mmol/L (ref 20.0–28.0)
Delivery systems: POSITIVE
Drawn by: 295031
Expiratory PAP: 8
FIO2: 100
Inspiratory PAP: 16
Mode: POSITIVE
O2 SAT: 92.7 %
Patient temperature: 98.6
pCO2 arterial: 73.6 mmHg (ref 32.0–48.0)
pH, Arterial: 7.116 — CL (ref 7.350–7.450)
pO2, Arterial: 85.7 mmHg (ref 83.0–108.0)

## 2019-01-20 LAB — TROPONIN I
Troponin I: <0.03 ng/mL (ref <0.03)
Troponin I: <0.03 ng/mL (ref <0.03)

## 2019-01-20 LAB — LACTATE DEHYDROGENASE: LDH: 104 U/L (ref 98–192)

## 2019-01-20 LAB — FERRITIN: Ferritin: 1798 ng/mL — ABNORMAL HIGH (ref 11–307)

## 2019-01-20 LAB — BRAIN NATRIURETIC PEPTIDE: B Natriuretic Peptide: 265.2 pg/mL — ABNORMAL HIGH (ref 0.0–100.0)

## 2019-01-20 LAB — FOLATE: FOLATE: 12.3 ng/mL (ref >5.9)

## 2019-01-20 LAB — MAGNESIUM: Magnesium: 2.3 mg/dL (ref 1.7–2.4)

## 2019-01-20 LAB — VITAMIN B12: Vitamin B-12: 822 pg/mL (ref 180–914)

## 2019-01-20 MED ORDER — MORPHINE SULFATE (PF) 2 MG/ML IV SOLN
1.0000 mg | INTRAVENOUS | Status: DC | PRN
  Administered 2019-01-21 – 2019-01-22 (×7): 2 mg via INTRAVENOUS
  Filled 2019-01-20 (×7): qty 1

## 2019-01-20 MED ORDER — POTASSIUM CHLORIDE 20 MEQ/15ML (10%) PO SOLN
30.0000 meq | ORAL | Status: DC
  Administered 2019-01-20: 30 meq via ORAL
  Filled 2019-01-20: qty 30

## 2019-01-20 MED ORDER — PANTOPRAZOLE SODIUM 40 MG PO TBEC: 40.0000 mg | DELAYED_RELEASE_TABLET | Freq: Every day | ORAL | Status: DC

## 2019-01-20 MED ORDER — MORPHINE SULFATE (PF) 2 MG/ML IV SOLN
1.0000 mg | INTRAVENOUS | Status: DC | PRN
  Administered 2019-01-20 (×2): 1 mg via INTRAVENOUS
  Filled 2019-01-20 (×2): qty 1

## 2019-01-20 MED ORDER — FUROSEMIDE 10 MG/ML IJ SOLN
40.0000 mg | Freq: Once | INTRAMUSCULAR | Status: AC
  Administered 2019-01-20: 40 mg via INTRAVENOUS

## 2019-01-20 MED ORDER — ARFORMOTEROL TARTRATE 15 MCG/2ML IN NEBU
15.0000 ug | INHALATION_SOLUTION | Freq: Two times a day (BID) | RESPIRATORY_TRACT | Status: DC
  Administered 2019-01-20 – 2019-01-21 (×2): 15 ug via RESPIRATORY_TRACT
  Filled 2019-01-20 (×4): qty 2

## 2019-01-20 MED ORDER — SODIUM CHLORIDE 0.9 % IV BOLUS
250.0000 mL | Freq: Once | INTRAVENOUS | Status: AC
  Administered 2019-01-20: 250 mL via INTRAVENOUS

## 2019-01-20 MED ORDER — MORPHINE SULFATE (PF) 2 MG/ML IV SOLN
1.0000 mg | Freq: Once | INTRAVENOUS | Status: AC
  Administered 2019-01-20: 1 mg via INTRAVENOUS

## 2019-01-20 MED ORDER — METHYLPREDNISOLONE SODIUM SUCC 125 MG IJ SOLR
125.0000 mg | Freq: Four times a day (QID) | INTRAMUSCULAR | Status: DC
  Administered 2019-01-20 (×2): 125 mg via INTRAVENOUS
  Filled 2019-01-20 (×2): qty 2

## 2019-01-20 MED ORDER — MORPHINE SULFATE (PF) 2 MG/ML IV SOLN: 1.0000 mg | INTRAVENOUS | Status: DC | PRN

## 2019-01-20 MED ORDER — MILK AND MOLASSES ENEMA
1.0000 | Freq: Once | RECTAL | Status: DC
  Filled 2019-01-20: qty 250

## 2019-01-20 MED ORDER — POTASSIUM CHLORIDE 20 MEQ/15ML (10%) PO SOLN
40.0000 meq | ORAL | Status: AC
  Administered 2019-01-20: 40 meq via ORAL
  Filled 2019-01-20: qty 30

## 2019-01-20 MED ORDER — POLYETHYLENE GLYCOL 3350 17 G PO PACK
17.0000 g | PACK | Freq: Two times a day (BID) | ORAL | Status: DC
  Administered 2019-01-20: 17 g via ORAL
  Filled 2019-01-20: qty 1

## 2019-01-20 MED ORDER — IPRATROPIUM BROMIDE 0.02 % IN SOLN
0.5000 mg | Freq: Four times a day (QID) | RESPIRATORY_TRACT | Status: DC
  Administered 2019-01-20 – 2019-01-21 (×5): 0.5 mg via RESPIRATORY_TRACT
  Filled 2019-01-20 (×5): qty 2.5

## 2019-01-20 MED ORDER — BUDESONIDE 0.5 MG/2ML IN SUSP
0.5000 mg | Freq: Two times a day (BID) | RESPIRATORY_TRACT | Status: DC
  Administered 2019-01-20 – 2019-01-21 (×2): 0.5 mg via RESPIRATORY_TRACT
  Filled 2019-01-20 (×3): qty 2

## 2019-01-20 MED ORDER — METOPROLOL TARTRATE 5 MG/5ML IV SOLN
5.0000 mg | Freq: Once | INTRAVENOUS | Status: AC
  Administered 2019-01-20: 5 mg via INTRAVENOUS

## 2019-01-20 MED ORDER — EPINEPHRINE PF 1 MG/10ML IJ SOSY
PREFILLED_SYRINGE | INTRAMUSCULAR | Status: AC
  Filled 2019-01-20: qty 10

## 2019-01-20 MED ORDER — MAGNESIUM CITRATE PO SOLN
1.0000 | Freq: Once | ORAL | Status: AC
  Administered 2019-01-20: 1 via ORAL
  Filled 2019-01-20: qty 296

## 2019-01-20 MED ORDER — LEVALBUTEROL HCL 0.63 MG/3ML IN NEBU: 0.6300 mg | INHALATION_SOLUTION | RESPIRATORY_TRACT | Status: DC | PRN

## 2019-01-20 MED ORDER — IPRATROPIUM BROMIDE 0.02 % IN SOLN: 0.5000 mg | RESPIRATORY_TRACT | Status: DC | PRN

## 2019-01-20 MED ORDER — METOPROLOL TARTRATE 5 MG/5ML IV SOLN: 5.0000 mg | Freq: Once | INTRAVENOUS | Status: DC

## 2019-01-20 MED ORDER — FUROSEMIDE 10 MG/ML IJ SOLN
INTRAMUSCULAR | Status: AC
  Administered 2019-01-20: 18:00:00
  Filled 2019-01-20: qty 4

## 2019-01-20 MED ORDER — LEVALBUTEROL HCL 0.63 MG/3ML IN NEBU
0.6300 mg | INHALATION_SOLUTION | Freq: Four times a day (QID) | RESPIRATORY_TRACT | Status: DC
  Administered 2019-01-20 – 2019-01-21 (×5): 0.63 mg via RESPIRATORY_TRACT
  Filled 2019-01-20 (×5): qty 3

## 2019-01-20 NOTE — Progress Notes (Addendum)
PROGRESS NOTE    Faith [name]  PPJ:[id number] DOB: 1951/06/18 DOA: 01/24/2019 PCP: [name], MD   Brief Narrative:  :68 y.o.femalewith medical history significant ofstage IV lung cancer diagnosed a year ago undergoing chemotherapy and radiation, chronic abdominal pain and lower back pain, bipolar disorder comes in with a couple of days of coughing that has been productive and nonbloody with associated subjective fevers and chills at home. Patient also reports shortness of breath. She was getting radiation today when they noticed her oxygen sats were a little low so they sent her to the emergency department for further evaluation for PE. Patient denies any lower extremity swelling or edema. She denies chest pain but does endorse right upper quadrant abdominal pain which is been present since her cancer is been found with mets. Patient found to have postobstructive pneumonia and referred for admission for such. She was hospitalized November but has not had recent a biotics. She is being covered with cefepime   Assessment & Plan:   Principal Problem:   Acute on chronic respiratory failure with hypoxia ([place]) Active Problems:   Right sided abdominal pain   Essential hypertension   Non-small cell carcinoma of right lung, stage 4 (HCC)   Palliative care encounter   Chronic pain   HCAP (healthcare-associated pneumonia)   Malnutrition of moderate degree   Malignant neoplasm of right lung (HCC)   Constipation   Hypomagnesemia   Goals of care, counseling/discussion   Tachycardia  1 acute respiratory failure with hypoxia Patient noted to suddenly go in acute respiratory failure with hypoxia early this afternoon with increased work of breathing, use of accessory muscles of respiration.  Patient with underlying stage IV lung cancer with what seemed like a worsening pleural effusion.  Patient on clinical exam with diffuse rhonchorous breath sounds.  Concern for possible volume  overload.  No weights have been obtained since admission.  I's and O's have not been correctly documented.  Transfer patient to stepdown unit.  Saline lock IV fluids.  Stat ABG, chest x-ray, CBC, BMP, BNP, EKG.  We will give Lasix 40 mg IV x1.  Strict I's and O's.  Daily weights.  Patient has been seen by critical care who is also going to give a dose of IV morphine x1.  Place on the BiPAP.  CT chest pending.  Supportive care.  2.  Tachycardia Patient noted to be tachycardic with heart rates in the 140s.  EKG was concerning for possible A. fib however P waves noted on telemetry.  We will give Lopressor 5 mg IV x1 and monitor.  If no significant improvement with tachycardia will give Cardizem IV x1.  Follow.  3 stage IV lung cancer with mediastinal and hilar node involvement/postobstructive pneumonitis Patient with ongoing chemotherapy and radiation.  CT chest showed a large necrotic neoplasm at the inferior right pulmonary hilum invading mediastinum 6 x 3.9 cm with increased central necrosis and new cavitation since prior study.  Increased atelectasis and consolidation of the right lower lobe, right middle lobe and additional infiltrate in the superior segment of the right lower lobe could represent postobstructive pneumonitis or lymphocytic tumor spread.  Increased right pleural effusion at least partially loculated. MRSA PCR was negative.  Vancomycin was discontinued and patient currently on IV cefepime.  Patient noted to have started radiation this week.  Patient with continued palliative radiation in-house.  Patient would like to have radiation done and get treated for her pneumonia and be discharged to a skilled nursing facility  and then reassess.  Patient noted to have lots of cough with increased sputum production and coughing when she eats and as such speech therapy consulted.  Patient started on a dysphagia 3 diet.  Aspiration precautions.  Continue Mucinex.  Follow.   4  Right pleural  effusion Patient with progressive stage IV lung cancer with mediastinal and hilar node involvement.  CT chest and chest x-ray which was done consistent with increasing right pleural effusion.  Repeat chest x-ray with worsening effusion.  Case discussed with pulmonary who consulted on the patient did a bedside ultrasound however no significant fluid noted to be tapped.  CT chest ordered and pending.  Continue IV antibiotics.  Appreciate PCCM's input and recommendations.  5.  Hypertension Continue to hold antihypertensive medications.  6.  Constipation Patient stated has not had a bowel movement since admission although she is on stool softeners.  Patient placed on a Dulcolax suppository and given a smog enema overnight and on MiraLAX however no bowel movement.  Increased MiraLAX to twice daily. Continue amitiza.  Manual disimpaction.  Patient has had smog enemas x2 with no significant bowel movement.  We will give mag citrate.  Milk of molasses enema if no results from mag citrate.  Continue Senokot-S.    7.  Severe deconditioning PT/OT.  Needs SNF on discharge.  8.  Anemia of chronic disease 1 unit packed red blood cells 01/17/2019.  Hemoglobin currently at 8.2.anemia panel consistent with anemia of chronic disease and iron deficiency anemia.  Follow H&H.  Once patient has improved from a respiratory standpoint may consider IV iron.  Follow.   9.  Hypokalemia/hypomagnesemia Replete.  10.  Protein calorie malnutrition Nutritional supplementation.   DVT prophylaxis: SCD Code Status: DNR Family Communication: Updated patient.  No family at bedside. Disposition Plan: Transfer to stepdown unit.     Consultants:   Palliative care Dr. Domingo Cocking 01/15/2019   PCCM: Dr. Ander Slade 01/20/2019  Procedures:   CT abdomen and pelvis 01/18/2019  CT angiogram chest 01/21/2019  Chest x-ray 01/28/2019, 01/17/2019, 01/19/2019  Antimicrobials:  IV cefepime 01/13/2019>>>> 01/21/2019  IV vancomycin  01/13/2019>>>> 01/14/2019   Subjective: Patient was doing fine earlier on this morning however was called by RN that patient declining and in acute respiratory distress.  Went to assess patient patient with use of accessory muscles of respiration, acute respiratory distress with diffuse rhonchorous/gurgling/crackles in the lung fields.  No significant bowel movement after enemas yesterday.  Objective: Vitals:   01/19/19 2157 01/19/19 2200 01/20/19 0456 01/20/19 1425  BP:  (!) 91/52 (!) 95/56   Pulse: 82 72 88 (!) 121  Resp:  20 20   Temp: 98.7 F (37.1 C) 98.2 F (36.8 C) 99.2 F (37.3 C)   TempSrc: Oral Oral Oral   SpO2: (!) 87% 100% 98% 94%  Weight:      Height:        Intake/Output Summary (Last 24 hours) at 01/20/2019 1436 Last data filed at 01/20/2019 0941 Gross per 24 hour  Intake 120 ml  Output -  Net 120 ml   Filed Weights   01/23/2019 1940  Weight: 51.7 kg    Examination:  General exam: Use of accessory muscles of respiration. Respiratory system: Coarse rhonchorous diffuse breath sounds with some gurgling and diffuse crackles.  Use of accessory muscles of respiration. Cardiovascular system: Tachycardic.  No JVD.  No lower extremity edema. Gastrointestinal system: Abdomen is mildly distended, soft, nontender, no rebound, no guarding.  Central nervous system: Alert and oriented.  No focal neurological deficits. Extremities: Symmetric 5 x 5 power. Skin: No rashes, lesions or ulcers Psychiatry: Judgement and insight appear normal. Mood & affect appropriate.     Data Reviewed: I have personally reviewed following labs and imaging studies  CBC: Recent Labs  Lab 01/14/19 0500 01/16/19 0625 01/18/19 0606 01/19/19 1135 01/20/19 0529  WBC 9.1 9.5 13.6* 14.8* 13.1*  NEUTROABS  --   --   --  13.3* 11.3*  HGB 8.2* 7.1* 9.3* 8.5* 8.2*  HCT 24.0* 21.0* 27.6* 25.4* 24.9*  MCV 81.4 81.4 85.2 85.8 86.2  PLT 241 299 278 267 244   Basic Metabolic Panel: Recent Labs   Lab 01/14/19 0500 01/16/19 0625 01/19/19 1135 01/20/19 0529  NA 135 139 141 142  K 3.6 3.7 3.0* 2.7*  CL 108 113* 112* 112*  CO2 21* 22 26 24   GLUCOSE 106* 119* 118* 125*  BUN 17 13 13 15   CREATININE 0.43* 0.48 0.37* 0.45  CALCIUM 10.2 10.2 9.3 8.8*  MG  --   --  1.7 2.3   GFR: Estimated Creatinine Clearance: 52.2 mL/min (by C-G formula based on SCr of 0.45 mg/dL). Liver Function Tests: Recent Labs  Lab 01/16/19 0625 01/19/19 1135 01/20/19 0529  AST 56* 21 20  ALT 37 28 24  ALKPHOS 133* 96 88  BILITOT 2.0* 1.6* 1.7*  PROT 6.0* 5.8* 6.0*  ALBUMIN 2.3* 2.0* 2.1*   No results for input(s): LIPASE, AMYLASE in the last 168 hours. No results for input(s): AMMONIA in the last 168 hours. Coagulation Profile: No results for input(s): INR, PROTIME in the last 168 hours. Cardiac Enzymes: No results for input(s): CKTOTAL, CKMB, CKMBINDEX, TROPONINI in the last 168 hours. BNP (last 3 results) No results for input(s): PROBNP in the last 8760 hours. HbA1C: No results for input(s): HGBA1C in the last 72 hours. CBG: No results for input(s): GLUCAP in the last 168 hours. Lipid Profile: No results for input(s): CHOL, HDL, LDLCALC, TRIG, CHOLHDL, LDLDIRECT in the last 72 hours. Thyroid Function Tests: No results for input(s): TSH, T4TOTAL, FREET4, T3FREE, THYROIDAB in the last 72 hours. Anemia Panel: Recent Labs    01/20/19 0536  VITAMINB12 822  FOLATE 12.3  FERRITIN 1,798*  TIBC 127*  IRON 16*   Sepsis Labs: No results for input(s): PROCALCITON, LATICACIDVEN in the last 168 hours.  Recent Results (from the past 240 hour(s))  Blood culture (routine x 2)     Status: None   Collection Time: 01/26/2019  2:38 PM  Result Value Ref Range Status   Specimen Description   Final    BLOOD LEFT CHEST Performed at [facility] [address] [place], [place] [address]    Special Requests   Final    BOTTLES DRAWN AEROBIC AND ANAEROBIC Blood Culture adequate  volume Performed at [facility] [address], [place], [place] [address]    Culture   Final    NO GROWTH 5 DAYS Performed at [facility], [place] [address] [place], [place] [address]    Report Status 01/17/2019 FINAL  Final  Blood culture (routine x 2)     Status: None   Collection Time: 01/07/2019  3:28 PM  Result Value Ref Range Status   Specimen Description   Final    BLOOD RIGHT FOREARM Performed at [facility] [address] [place],  [place] [address]    Special Requests   Final    BOTTLES DRAWN AEROBIC AND ANAEROBIC Blood  Culture adequate volume Performed at [facility] [address], [place], [place] [address]    Culture   Final    NO GROWTH 5 DAYS Performed at [facility], [place] [address] [place], [place] [address]    Report Status 01/17/2019 FINAL  Final  Respiratory Panel by PCR     Status: None   Collection Time: 01/13/19  8:55 AM  Result Value Ref Range Status   Adenovirus NOT DETECTED NOT DETECTED Final   Coronavirus 229E NOT DETECTED NOT DETECTED Final    Comment: (NOTE) The Coronavirus on the Respiratory Panel, DOES NOT test for the novel  Coronavirus (2019 nCoV)    Coronavirus HKU1 NOT DETECTED NOT DETECTED Final   Coronavirus NL63 NOT DETECTED NOT DETECTED Final   Coronavirus OC43 NOT DETECTED NOT DETECTED Final   Metapneumovirus NOT DETECTED NOT DETECTED Final   Rhinovirus / Enterovirus NOT DETECTED NOT DETECTED Final   Influenza A NOT DETECTED NOT DETECTED Final   Influenza B NOT DETECTED NOT DETECTED Final   Parainfluenza Virus 1 NOT DETECTED NOT DETECTED Final   Parainfluenza Virus 2 NOT DETECTED NOT DETECTED Final   Parainfluenza Virus 3 NOT DETECTED NOT DETECTED Final   Parainfluenza Virus 4 NOT DETECTED NOT DETECTED Final   Respiratory Syncytial Virus NOT DETECTED NOT DETECTED Final   Bordetella pertussis NOT DETECTED NOT DETECTED Final   Chlamydophila pneumoniae NOT  DETECTED NOT DETECTED Final   Mycoplasma pneumoniae NOT DETECTED NOT DETECTED Final    Comment: Performed at [facility], [place] [address] [place], [place] [address]  MRSA PCR Screening     Status: None   Collection Time: 01/13/19  8:55 AM  Result Value Ref Range Status   MRSA by PCR NEGATIVE NEGATIVE Final    Comment:        The GeneXpert MRSA Assay (FDA approved for NASAL specimens only), is one component of a comprehensive MRSA colonization surveillance program. It is not intended to diagnose MRSA infection nor to guide or monitor treatment for MRSA infections. Performed at [facility], [place] [address], [place],  [address]          Radiology Studies: Dg Chest 2 View  Result Date: 01/19/2019 CLINICAL DATA:  Hypoxia EXAM: CHEST - 2 VIEW COMPARISON:  01/17/2019 FINDINGS: LEFT jugular central venous catheter with tip projecting over SVC. Enlargement of cardiac silhouette with pulmonary vascular congestion. Moderate RIGHT pleural effusion and basilar atelectasis versus consolidation. Subsegmental atelectasis at LEFT base. New radiopacities identified at the RIGHT lung base, consistent with aspirated contrast material presumed due to earlier swallowing function study. No pneumothorax or acute osseous findings. IMPRESSION: Moderate RIGHT pleural effusion with atelectasis versus consolidation in lower RIGHT lung. Subsegmental atelectasis LEFT base. New aspirated contrast material at inferior RIGHT hemithorax. Electronically Signed   By: Lavonia Dana M.D.   On: 01/19/2019 16:08   Dg Chest Port 1 View  Result Date: 01/20/2019 CLINICAL DATA:  68 year old female with history of acute respiratory distress. EXAM: PORTABLE CHEST 1 VIEW COMPARISON:  Chest x-ray 01/19/2019. FINDINGS: Large right pleural effusion, slightly increased compared to the prior study. Opacity in the base of the right hemithorax may reflect underlying atelectasis and/or consolidation. Left  lung is clear. No left pleural effusion. No evidence of pulmonary edema. Heart size is mildly enlarged. Upper mediastinal contours are within normal limits. Aortic atherosclerosis. Left internal jugular single-lumen power porta cath with tip terminating in the superior cavoatrial junction. IMPRESSION: 1. Enlarging  chronic right pleural effusion with associated areas of atelectasis and/or consolidation in the right lung base. Known right lower lobe mass is poorly demonstrated on today's chest radiograph. 2. Mild cardiomegaly. 3. Aortic atherosclerosis. Electronically Signed   By: Vinnie Langton M.D.   On: 01/20/2019 14:30   Dg Swallowing Func-speech Pathology  Result Date: 01/19/2019 Objective Swallowing Evaluation: Type of Study: MBS-Modified Barium Swallow Study  Patient Details Name: Naje A [name] MRN: [id number] Date of Birth: December 21, 1950 Today's Date: 01/19/2019 Time: SLP Start Time (ACUTE ONLY): 0810 -SLP Stop Time (ACUTE ONLY): 0850 SLP Time Calculation (min) (ACUTE ONLY): 40 min Past Medical History: Past Medical History: Diagnosis Date . Anxiety  . Arthritis   "thighs; legs; hips" (07/05/2014) . Bipolar disorder ([place])  . Cancer ([place])  . Cholelithiasis 07/2014 . Chronic bronchitis ([place])   "get it q yr" . Chronic lower back pain  . Depression   pt. use to go to depression clinic, states she still has depression & anxiety but can't get to appt. so she hasn't had any med. for it in a while  . Dyspnea  . GERD (gastroesophageal reflux disease)  . High cholesterol  . History of radiation therapy 02/01/18- 02/12/18  Right Neck/ 30 Gy in 10 fractions.  . Hypertension  . Mass in neck 12/2017  RIGHT SIDE OF NECK . Peripheral vascular disease ([place])  . Schizophrenia ([place])  . 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 ABDOMINAL AORTOGRAM N/A 01/07/2017  Procedure: Abdominal Aortogram;  Surgeon: Waynetta Sandy, MD;  Location: [facility];   Service: Cardiovascular;  Laterality: N/A; . ABDOMINAL HYSTERECTOMY   . ANKLE FRACTURE SURGERY Right  . ANKLE HARDWARE REMOVAL Right  . APPENDECTOMY  ~ 1963 . BREAST BIOPSY Bilateral  . BREAST CYST EXCISION Bilateral   "not cancer" . CATARACT EXTRACTION W/ INTRAOCULAR LENS  IMPLANT, BILATERAL   . CHOLECYSTECTOMY N/A 07/05/2014  Procedure: LAPAROSCOPIC CHOLECYSTECTOMY WITH INTRAOPERATIVE CHOLANGIOGRAM;  Surgeon: Gayland Curry, MD;  Location: [facility];  Service: General;  Laterality: N/A; . EXCISIONAL HEMORRHOIDECTOMY   . FEMORAL-POPLITEAL BYPASS GRAFT Right 01/15/2017  Procedure: BYPASS GRAFT FEMORAL-POPLITEAL ARTERY RIGHT LEG;  Surgeon: Waynetta Sandy, MD;  Location: [facility];  Service: Vascular;  Laterality: Right; . IR IMAGING GUIDED PORT INSERTION  03/08/2018 . IR US GUIDE VASC ACCESS LEFT  03/08/2018 . LAPAROSCOPIC CHOLECYSTECTOMY  07/05/2014 . LOWER EXTREMITY ANGIOGRAPHY Bilateral 01/07/2017  Procedure: Lower Extremity Angiography;  Surgeon: Waynetta Sandy, MD;  Location: [facility];  Service: Cardiovascular;  Laterality: Bilateral; . MASS BIOPSY Right 12/25/2017  Procedure: INCISIONAL RIGHT NECK MASS BIOPSY;  Surgeon: Helayne Seminole, MD;  Location: [facility];  Service: ENT;  Laterality: Right; . MULTIPLE TOOTH EXTRACTIONS  05/2013  "pulled my upper teeth" . PILONIDAL CYST EXCISION   HPI: 68 yo female adm to [facility] with h/o stage IV lung cancer admitted with respiratory deficits.  Pt has undergone chemo and radiation and is currently undergoing palliative radiation.  She has right neck mets per prior imaging study and h/o CVA.  Prior MBS 01/14/2018 showed sensory deficit with delayed swallow- mild deficits..  Pt has been coughing with intake and swallow eval ordered.  Subjective: pt awake in chair, baseline congested cough with expectoration of secretions with use of suction Assessment / Plan / Recommendation CHL IP CLINICAL IMPRESSIONS 01/19/2019 Clinical Impression Patient presents with inconsistent  swallow function and airway protection.  Pt with oropharyngeal dysphagia  with sensorimotor deficits.  She demonstrates decreased oral control due to weakness resulting in lingual pumping, decreased bolus cohesion, premature spillage and delayed transiting with solids/puree.  Oral residuals prematurely spill into pharynx with delayed swallow at 4 seconds at pyriform sinus with thin to clear.  Pt aspirated nectar barium oral residuals that as she coughed (and brought up secretions mixed with barium from trachea) she then inhaled GROSSLY aspirating barium retained in pharynx *near vallecular region.  Pt did not clear aspirates despite effortful cough, although she was able to expectorate copious secretions that were mixed with barium.  Pt's gross aspiration produced excessively strong coughing and thus dyspnea with pt requiring several minutes to recover.   With puree, pt with 4 second delay with initial swallow trigger at vallecular region.  Oral residuals of puree spilled into pharynx at tongue base and were not swallowed despite effort after 9 seconds. Pt did cough and expectorate pudding barium from vallecular region prior to SLP providing nectar liquid to aid clearance. Her swallow function was inconsistent however decreasing bolus size to tsps and VERY SMALL sips helped to protect her airway.  Chin tuck posture not recommended due to her pyriform residuals concerning for spillage into open airway- though this did not occur with testing chin tuck.  Pt clearly states desires to eat/drink and aspiration mitigation with this pt who has Stage IV cancer seems appropriate for her.  Adequacy of nutrition remains a concern due to level of dysphagia and effort required to consume po.  Recommend to consider dys3/nectar diet - with pt following bite of food with VERY SMALL SIP OR TSP of liquid.   Recommend pt be able to consume thin liquids between meals.  Pt will likely aspirate even with these precautions, but this will  likely protect her airway better and thus increase comfort with po (decrease coughing).  Recommend to consider crushing her medications if not contraindicated and provide with puree.  Will follow up for pt/family education to help mitigate dysphagia/aspiration.  Thanks for this consult.    SLP Visit Diagnosis Dysphagia, oropharyngeal phase (R13.12) Attention and concentration deficit following -- Frontal lobe and executive function deficit following -- Impact on safety and function Moderate aspiration risk;Risk for inadequate nutrition/hydration   CHL IP TREATMENT RECOMMENDATION 01/19/2019 Treatment Recommendations Therapy as outlined in treatment plan below   Prognosis 01/19/2019 Prognosis for Safe Diet Advancement Guarded Barriers to Reach Goals Other (Comment) Barriers/Prognosis Comment -- CHL IP DIET RECOMMENDATION 01/19/2019 SLP Diet Recommendations Dysphagia 3 (Mech soft) solids;Nectar thick liquid Liquid Administration via Cup;Spoon Medication Administration Crushed with puree Compensations Slow rate;Small sips/bites;Other (Comment);Follow solids with liquid Postural Changes Seated upright at 90 degrees;Remain semi-upright after after feeds/meals (Comment)   CHL IP OTHER RECOMMENDATIONS 01/19/2019 Recommended Consults -- Oral Care Recommendations Oral care before and after PO Other Recommendations Have oral suction available   CHL IP FOLLOW UP RECOMMENDATIONS 01/18/2019 Follow up Recommendations (No Data)   CHL IP FREQUENCY AND DURATION 01/19/2019 Speech Therapy Frequency (ACUTE ONLY) min 2x/week Treatment Duration 2 weeks      CHL IP ORAL PHASE 01/19/2019 Oral Phase Impaired Oral - Pudding Teaspoon -- Oral - Pudding Cup -- Oral - Honey Teaspoon -- Oral - Honey Cup -- Oral - Nectar Teaspoon Decreased bolus cohesion;Lingual/palatal residue;Weak lingual manipulation;Lingual pumping;Premature spillage Oral - Nectar Cup Premature spillage;Weak lingual manipulation;Lingual pumping;Decreased bolus cohesion;Lingual/palatal  residue Oral - Nectar Straw Decreased bolus cohesion;Lingual/palatal residue;Weak lingual manipulation;Lingual pumping;Premature spillage Oral - Thin Teaspoon Decreased bolus cohesion;Lingual pumping;Weak lingual manipulation;Premature spillage;Lingual/palatal  residue Oral - Thin Cup Decreased bolus cohesion;Weak lingual manipulation;Lingual pumping;Premature spillage;Lingual/palatal residue Oral - Thin Straw Decreased bolus cohesion;Reduced posterior propulsion;Weak lingual manipulation;Lingual pumping;Premature spillage;Lingual/palatal residue Oral - Puree Decreased bolus cohesion;Delayed oral transit;Weak lingual manipulation;Lingual pumping;Premature spillage;Lingual/palatal residue Oral - Mech Soft Decreased bolus cohesion;Delayed oral transit;Weak lingual manipulation;Impaired mastication;Lingual pumping;Premature spillage Oral - Regular -- Oral - Multi-Consistency -- Oral - Pill -- Oral Phase - Comment --  CHL IP PHARYNGEAL PHASE 01/19/2019 Pharyngeal Phase Impaired Pharyngeal- Pudding Teaspoon -- Pharyngeal -- Pharyngeal- Pudding Cup -- Pharyngeal -- Pharyngeal- Honey Teaspoon -- Pharyngeal -- Pharyngeal- Honey Cup -- Pharyngeal -- Pharyngeal- Nectar Teaspoon Delayed swallow initiation-pyriform sinuses Pharyngeal -- Pharyngeal- Nectar Cup Delayed swallow initiation-pyriform sinuses;Penetration/Apiration after swallow;Penetration/Aspiration during swallow;Significant aspiration (Amount) Pharyngeal Material enters airway, passes BELOW cords and not ejected out despite cough attempt by patient Pharyngeal- Nectar Straw Delayed swallow initiation-vallecula;Pharyngeal residue - valleculae;Delayed swallow initiation-pyriform sinuses Pharyngeal Material does not enter airway Pharyngeal- Thin Teaspoon WFL Pharyngeal -- Pharyngeal- Thin Cup WFL Pharyngeal -- Pharyngeal- Thin Straw WFL Pharyngeal -- Pharyngeal- Puree Delayed swallow initiation-vallecula;Reduced pharyngeal peristalsis;Pharyngeal residue -  valleculae;Reduced epiglottic inversion;Reduced anterior laryngeal mobility Pharyngeal -- Pharyngeal- Mechanical Soft -- Pharyngeal -- Pharyngeal- Regular -- Pharyngeal -- Pharyngeal- Multi-consistency -- Pharyngeal -- Pharyngeal- Pill -- Pharyngeal -- Pharyngeal Comment Oral residuals of thin prematurely spill into pharynx with delayed swallow at 4 seconds at pyriform sinus to clear.  Pt aspirated nectar barium oral residuals that as she coughed (and brought up secretions mixed with barium from trahcea) and then inhaled GROSSLY aspirating barium retained in pharynx *near vallecular region.  Pt did not clear aspirates despite effortful cough,  though she was able to expectorate secretions mixed with barium.   With puree, pt with 4 second delay with swallow trigger at vallecular region.  Oral residuals spilling into pharynx at tongue base were not swallowed despite effort after 9 seconds. Pt did cough and expectorate pudding barium from vallecular region prior to SlP providing nectar liquid to aid clearance.    CHL IP CERVICAL ESOPHAGEAL PHASE 01/19/2019 Cervical Esophageal Phase Impaired Pudding Teaspoon -- Pudding Cup -- Honey Teaspoon -- Honey Cup -- Nectar Teaspoon -- Nectar Cup -- Nectar Straw -- Thin Teaspoon -- Thin Cup -- Thin Straw -- Puree -- Mechanical Soft -- Regular -- Multi-consistency -- Pill -- Cervical Esophageal Comment Appearance of secretions and barium retained in esophagus, suspect this may contribute to her aspiration risk and pt was not sensate Macario Golds 01/19/2019, 10:07 AM  [name], MS [facility] Pager [phone] Office [phone]                  Scheduled Meds: . bisacodyl  10 mg Oral Daily  . clopidogrel  75 mg Oral Daily  . feeding supplement (ENSURE ENLIVE)  237 mL Oral BID BM  . fluticasone  1 spray Each Nare Daily  . furosemide      . gabapentin  300 mg Oral TID  . guaiFENesin  1,200 mg Oral BID  . loratadine  10 mg Oral Daily  .  lubiprostone  24 mcg Oral BID WC  . metoprolol tartrate  5 mg Intravenous Once  . milk and molasses  1 enema Rectal Once  . polyethylene glycol  17 g Oral BID  . potassium chloride  40 mEq Oral Daily  . senna-docusate  3 tablet Oral QHS  . sodium chloride flush  3 mL Intravenous Q12H  . tiZANidine  4 mg Oral BID   Continuous  Infusions: . sodium chloride 250 mL (01/18/19 2128)  . ceFEPime (MAXIPIME) IV 1 g (01/20/19 1237)     LOS: 8 days    Time spent: 66 minutes    Irine Seal, MD [facility]  If 7PM-7AM, please contact night-coverage [email or web address] 01/20/2019, 2:36 PM

## 2019-01-20 NOTE — Progress Notes (Signed)
Patient was seen at bedside  Increased work of breathing  Rhonchorous breath sounds, tachycardic in the 140s  Currently on BiPAP  ABG revealed pH of 7.11  Maintain BiPAP PRN morphine for increased work of breathing  Did receive Lasix earlier

## 2019-01-20 NOTE — Progress Notes (Signed)
CPT not started due to pt. requiring more support from BiPAP, RT to monitor.

## 2019-01-20 NOTE — Care Management Important Message (Signed)
Important Message  Patient Details  Name: Alyshia A Martinique MRN: [id number] Date of Birth: 06-22-1951   Medicare Important Message Given:  Yes    Kerin Salen 01/20/2019, 12:01 [place] Message  Patient Details  Name: Alima A Martinique MRN: [id number] Date of Birth: 09-Jun-1951   Medicare Important Message Given:  Yes    Kerin Salen 01/20/2019, 12:01 PM

## 2019-01-20 NOTE — Progress Notes (Signed)
SLP Cancellation Note  Patient Details Name: Faith [name] MRN: [id number] DOB: 13-Mar-1951   Cancelled treatment:       Reason Eval/Treat Not Completed: Other (comment)(pt transferred to ICU due to respiratory issues, pt with viscous secretions suctioned from oral cavity per RN, aspirating secretions on MBS and desired to eat despite high aspiration risk    [name], MS [facility] SLP Acute Rehab Services Pager [phone] Office [phone]   Macario Golds 01/20/2019, 3:11 PM

## 2019-01-20 NOTE — Progress Notes (Signed)
  Echocardiogram 2D Echocardiogram has been performed.  Faith [name] 01/20/2019, 4:21 PM

## 2019-01-20 NOTE — Progress Notes (Signed)
PT Cancellation Note  Patient Details Name: Faith [name] MRN: [id number] DOB: December 06, 1950   Cancelled Treatment:     pt transferred to ICU.  Will attempt to Tx another day as schedule permits.   Nathanial Rancher 01/20/2019, 2:58 PM

## 2019-01-20 NOTE — Progress Notes (Signed)
Daily Progress Note   Patient Name: Faith [name]       Date: 01/20/2019 DOB: 1951-09-18  Age: 68 y.o. MRN#: [id number] Attending Physician: Eugenie Filler, MD Primary Care Physician: [name], MD Admit Date: 01/07/2019  Reason for Consultation/Follow-up: Establishing goals of care, Non pain symptom management and Pain control  Subjective: I met with [name] today.  Sitting up in bed, trying to feed herself breakfast, she has eaten about 30% of her meals.   She denies dyspnea.    Would like to continue with current regimen for pain/sob.  Length of Stay: 8  Current Medications: Scheduled Meds:  . bisacodyl  10 mg Oral Daily  . clopidogrel  75 mg Oral Daily  . feeding supplement (ENSURE ENLIVE)  237 mL Oral BID BM  . fluticasone  1 spray Each Nare Daily  . gabapentin  300 mg Oral TID  . guaiFENesin  1,200 mg Oral BID  . loratadine  10 mg Oral Daily  . lubiprostone  24 mcg Oral BID WC  . milk and molasses  1 enema Rectal Once  . polyethylene glycol  17 g Oral BID  . potassium chloride  40 mEq Oral Daily  . potassium chloride  40 mEq Oral Q4H  . senna-docusate  3 tablet Oral QHS  . sodium chloride flush  3 mL Intravenous Q12H  . tiZANidine  4 mg Oral BID    Continuous Infusions: . sodium chloride 250 mL (01/18/19 2128)  . ceFEPime (MAXIPIME) IV 1 g (01/20/19 0334)    PRN Meds: sodium chloride, albuterol, diphenhydrAMINE, guaiFENesin-dextromethorphan, ibuprofen, linaclotide, lip balm, mometasone, oxyCODONE, sodium chloride flush  Physical Exam     General: Alert, awake,  appears comfortable HEENT: No bruits, no goiter, no JVD Heart: Regular Lungs: Coarse, fair air movement Abdomen: Nondistended  Ext: No significant edema Skin: Warm and dry Neuro: Grossly  intact, nonfocal.  Slight left eye deviation.      Vital Signs: BP (!) 95/56 (BP Location: Left Arm)   Pulse 88   Temp 99.2 F (37.3 C) (Oral)   Resp 20   Ht _0  (1.549 m)   Wt 51.7 kg   SpO2 98%   BMI 21.54 kg/m  SpO2: SpO2: 98 % O2 Device: O2 Device: Nasal Cannula O2 Flow Rate: O2 Flow Rate (L/min):  2 L/min  Intake/output summary:   Intake/Output Summary (Last 24 hours) at 01/20/2019 1018 Last data filed at 01/20/2019 0941 Gross per 24 hour  Intake 120 ml  Output -  Net 120 ml   LBM: Last BM Date: 01/20/19(constipated but oozing arround hard stool) Baseline Weight: Weight: 51.7 kg Most recent weight: Weight: 51.7 kg       Palliative Assessment/Data:    Flowsheet Rows     Most Recent Value  Intake Tab  Referral Department  Hospitalist  Unit at Time of Referral  Oncology Unit  Palliative Care Primary Diagnosis  Cancer  Date Notified  01/13/19  Palliative Care Type  New Palliative care  Reason for referral  Clarify Goals of Care  Date of Admission  01/20/2019  Date first seen by Palliative Care  01/15/19  # of days Palliative referral response time  2 Day(s)  # of days IP prior to Palliative referral  1  Clinical Assessment  Pain Max last 24 hours  8  Pain Min Last 24 hours  0  Dyspnea Max Last 24 Hours  8  Dyspnea Min Last 24 hours  0  Psychosocial & Spiritual Assessment  Palliative Care Outcomes  Patient/Family meeting held?  Yes  Who was at the meeting?  Patient, daughter  Palliative Care Outcomes  Improved non-pain symptom therapy, Clarified goals of care      Patient Active Problem List   Diagnosis Date Noted  . Constipation   . Hypomagnesemia   . Malignant neoplasm of right lung ([place])   . Malnutrition of moderate degree 01/14/2019  . HCAP (healthcare-associated pneumonia) 01/18/2019  . Dysphagia 12/16/2018  . Fecal impaction in rectum ([place]) 10/20/2018  . Fecal impaction of rectum ([place]) 10/20/2018  . Chronic pain 10/20/2018  . Port-A-Cath in  place 04/28/2018  . Encounter for antineoplastic chemotherapy 04/21/2018  . Palliative care encounter 04/21/2018  . Non-small cell carcinoma of right lung, stage 4 ([place]) 02/23/2018  . Secondary and unspecified malignant neoplasm of lymph nodes of head, face and neck ([place]) 01/05/2018  . Hyperkalemia 12/23/2017  . HLD (hyperlipidemia) 12/22/2017  . GERD (gastroesophageal reflux disease) 12/22/2017  . History of stroke 12/22/2017  . Tobacco abuse 12/22/2017  . Mass in neck 12/22/2017  . Hypokalemia 12/22/2017  . Hypercalcemia 12/22/2017  . Nausea vomiting and diarrhea 12/22/2017  . AKI (acute kidney injury) ([place]) 12/22/2017  . Stroke-like symptom 02/14/2017  . Low potassium syndrome 02/14/2017  . PAD (peripheral artery disease) ([place]) 01/15/2017  . Nonhealing nonsurgical wound 12/05/2016  . Essential hypertension 07/06/2014  . Depression 07/06/2014  . Back pain 07/06/2014  . Chronic cholecystitis 07/05/2014  . Right sided abdominal pain 05/11/2014  . Cholelithiasis 05/11/2014    Palliative Care Assessment & Plan   Assessment: 68 y.o. female  with past medical history of stage IV lung cancer diagnosed 1 year ago and undergoing treatment with Keytruda and radiation, chronic abdominal and lower back pain, bipolar disorder admitted on 01/04/2019 with obstructive pneumonia.  Palliative consulted for goals of care.  Recommendations/Plan:  Pain/SOB: Reports current regimen has been working well for her.  Denies pain or SOB at this time (does have some increased WOB).  Continue oxycodone 10 to 20 mg every 3 hours as needed for pain or shortness of breath.  Recommend lower dose of oxycodone 10 mg every 3 hours as needed for shortness of breath and utilizing the higher dose of oxycodone 20 mg every 3 hours as needed for pain if it recurs.  Continue current interventions.  She reports understanding that her cancer is not going to be cured and will continue to progress.  She is hopeful that  continuation of radiation will allow her to feel better if it is able to help with her postobstructive pneumonia.  She reports wanting to complete radiation and then reassess her situation.  Appreciate SLP evaluation, their follow up and recommendations. Patient asking for regular milk, discussed with patient about SLP recommendations, hand out of their recommendations also on the patient's desk. Continue to monitor.   Agree with SNF on discharge, she will benefit from palliative care following her at her SNF.  Continue current pain and non pain symptom management regimen. Med history checked, she is on adequate bowel regimen as well.   No additional PMT specific recommendations at this time.    Code Status:    Code Status Orders  (From admission, onward)         Start     Ordered   01/15/19 0738  Do not attempt resuscitation (DNR)  Continuous    Question Answer Comment  In the event of cardiac or respiratory ARREST Do not call a "code blue"   In the event of cardiac or respiratory ARREST Do not perform Intubation, CPR, defibrillation or ACLS   In the event of cardiac or respiratory ARREST Use medication by any route, position, wound care, and other measures to relive pain and suffering. May use oxygen, suction and manual treatment of airway obstruction as needed for comfort.      01/15/19 0737        Code Status History    Date Active Date Inactive Code Status Order ID Comments User Context   01/01/2019 1941 01/15/2019 0737 Full Code [id number]  Phillips Grout, MD ED   10/20/2018 2346 10/25/2018 1410 Full Code [id number]  Vianne Bulls, MD ED   12/22/2017 2351 12/25/2017 1958 Full Code [id number]  Ivor Costa, MD ED   02/14/2017 0741 02/18/2017 2028 Full Code [id number]  Elwin Mocha, MD ED   01/15/2017 1614 01/19/2017 1738 Full Code [id number]  Waynetta Sandy, MD Inpatient   01/07/2017 0949 01/07/2017 1710 Full Code [id number]  Waynetta Sandy, MD Inpatient   07/05/2014  1118 07/06/2014 1444 Full Code [id number]  Greer Pickerel, MD Inpatient       Prognosis:   Unable to determine  Discharge Planning:  SNF rehab with palliative is recommended.   Care plan was discussed with patient,  Thank you for allowing the Palliative Medicine Team to assist in the care of this patient.   Total Time 25 Prolonged Time Billed No      Greater than 50%  of this time was spent counseling and coordinating care related to the above assessment and plan.  Loistine Chance, MD [phone] Please contact Palliative Medicine Team phone at [phone] for questions and concerns.

## 2019-01-20 NOTE — Progress Notes (Signed)
Pt had an acute change in respiratory status requiring the pt to be placed back on BiPAP. Also pt became tachycardic into the 130's-140's with SBP in the 50's-70's that did not respond to IV fluid boluses. Given pts poor overall prognosis and worsening condition I spoke with the pts daughter "Charisse March" and discussed transitioning the pt to comfort care. She was agreeable with the decision to transition the pt to comfort care at this time.  Arby Barrette AGPCNP-BC, AGNP-C [facility] Pager [phone]

## 2019-01-20 NOTE — Consult Note (Signed)
NAME:  Faith [name], MRN:  [id number], DOB:  1951/07/07, LOS: 8 ADMISSION DATE:  01/03/2019, CONSULTATION DATE:  01/20/2019 REFERRING MD:  Grandville Silos, CHIEF COMPLAINT:  SOB, Pleural efusion, Pneumonia   Brief History   68 year old lady with a history of stage IV lung cancer On chemo radiation treatment History of bipolar disorder Came in with cough, productive of sputum Subjective fevers and chills Worsening shortness of breath Currently being treated for postobstructive pneumonia History of present illness    Currently receiving radiation treatments with worsening status  Past Medical History   Past Medical History:  Diagnosis Date  . Anxiety   . Arthritis    "thighs; legs; hips" (07/05/2014)  . Bipolar disorder ([place])   . Cancer ([place])   . Cholelithiasis 07/2014  . Chronic bronchitis ([place])    "get it q yr"  . Chronic lower back pain   . Depression    pt. use to go to depression clinic, states she still has depression & anxiety but can't get to appt. so she hasn't had any med. for it in a while   . Dyspnea   . GERD (gastroesophageal reflux disease)   . High cholesterol   . History of radiation therapy 02/01/18- 02/12/18   Right Neck/ 30 Gy in 10 fractions.   . Hypertension   . Mass in neck 12/2017   RIGHT SIDE OF NECK  . Peripheral vascular disease ([place])   . Schizophrenia ([place])   . Stroke [facility])    caused left eye blindness     Significant Hospital Events    Worsening shortness of breath  Consults:  PCCM 01/20/2019  Procedures:    Significant Diagnostic Tests:  CT scan of the chest noted-right hilar mass, right pleural effusion  Micro Data:  Blood cultures 01/10/2019-negative to date MRSA by PCR 11/13/2019-negative  Antimicrobials:  Cefepime 2/12>> Vancomycin 01/14/2019  Interim history/subjective:  Cough, shortness of breath, no fever  Objective   Blood pressure (!) 95/56, pulse 88, temperature 99.2 F (37.3 C), temperature source Oral, resp. rate 20,  height 5\' 1"  (1.549 m), weight 51.7 kg, SpO2 98 %.        Intake/Output Summary (Last 24 hours) at 01/20/2019 1101 Last data filed at 01/20/2019 0941 Gross per 24 hour  Intake 120 ml  Output -  Net 120 ml   Filed Weights   01/03/2019 1940  Weight: 51.7 kg    Examination: General: Chronically ill looking HENT: Dry oral mucosa Lungs: Decreased air entry bilaterally Cardiovascular: S1-S2 appreciated Abdomen: Bowel sounds appreciated Extremities: Mild clubbing, no edema Neuro: Alert and oriented x3   Resolved Hospital Problem list    Assessment & Plan:  Stage IV lung cancer -She continues on radiation treatments -Extensive mass  Right pleural effusion -Worsening shortness of breath -Possibility of increasing pleural effusion -We will plan for thoracentesis   Severe deconditioning -Continue support  Severe malnutrition Constipation  Possible pneumonia/postobstructive pneumonia -Has been afebrile -If pleural fluid not showing any organism then antibiotics may be stopped after 10 days  Best practice:   Disposition: Continue current medical management  Thank you for the consultation we will continue to follow  Labs   CBC: Recent Labs  Lab 01/14/19 0500 01/16/19 0625 01/18/19 0606 01/19/19 1135 01/20/19 0529  WBC 9.1 9.5 13.6* 14.8* 13.1*  NEUTROABS  --   --   --  13.3* 11.3*  HGB 8.2* 7.1* 9.3* 8.5* 8.2*  HCT 24.0* 21.0* 27.6* 25.4* 24.9*  MCV 81.4 81.4 85.2 85.8  86.2  PLT 241 299 278 267 283    Basic Metabolic Panel: Recent Labs  Lab 01/14/19 0500 01/16/19 0625 01/19/19 1135 01/20/19 0529  NA 135 139 141 142  K 3.6 3.7 3.0* 2.7*  CL 108 113* 112* 112*  CO2 21* 22 26 24   GLUCOSE 106* 119* 118* 125*  BUN 17 13 13 15   CREATININE 0.43* 0.48 0.37* 0.45  CALCIUM 10.2 10.2 9.3 8.8*  MG  --   --  1.7 2.3   GFR: Estimated Creatinine Clearance: 52.2 mL/min (by C-G formula based on SCr of 0.45 mg/dL). Recent Labs  Lab 01/16/19 0625 01/18/19 0606  01/19/19 1135 01/20/19 0529  WBC 9.5 13.6* 14.8* 13.1*    Liver Function Tests: Recent Labs  Lab 01/16/19 0625 01/19/19 1135 01/20/19 0529  AST 56* 21 20  ALT 37 28 24  ALKPHOS 133* 96 88  BILITOT 2.0* 1.6* 1.7*  PROT 6.0* 5.8* 6.0*  ALBUMIN 2.3* 2.0* 2.1*   No results for input(s): LIPASE, AMYLASE in the last 168 hours. No results for input(s): AMMONIA in the last 168 hours.  ABG    Component Value Date/Time   HCO3 20.8 02/09/2008 1950   TCO2 17 02/14/2017 0429   ACIDBASEDEF 4.0 (H) 02/09/2008 1950     Coagulation Profile: No results for input(s): INR, PROTIME in the last 168 hours.  Cardiac Enzymes: No results for input(s): CKTOTAL, CKMB, CKMBINDEX, TROPONINI in the last 168 hours.  HbA1C: Hgb A1c MFr Bld  Date/Time Value Ref Range Status  02/15/2017 06:10 AM 4.8 4.8 - 5.6 % Final    Comment:    (NOTE)         Pre-diabetes: 5.7 - 6.4         Diabetes: >6.4         Glycemic control for adults with diabetes: <7.0     CBG: No results for input(s): GLUCAP in the last 168 hours.  Review of Systems:   Review of Systems  Constitutional: Negative.   HENT: Negative.   Eyes: Negative.   Respiratory: Positive for cough and shortness of breath.   Cardiovascular: Negative.   Gastrointestinal: Negative.   Skin: Negative.   All other systems reviewed and are negative.    Past Medical History  She,  has a past medical history of Anxiety, Arthritis, Bipolar disorder ([place]), Cancer ([place]), Cholelithiasis (07/2014), Chronic bronchitis ([place]), Chronic lower back pain, Depression, Dyspnea, GERD (gastroesophageal reflux disease), High cholesterol, History of radiation therapy (02/01/18- 02/12/18), Hypertension, Mass in neck (12/2017), Peripheral vascular disease ([place]), Schizophrenia ([place]), and Stroke ([place]).   Surgical History    Past Surgical History:  Procedure Laterality Date  . ABDOMINAL AORTOGRAM N/A 01/07/2017   Procedure: Abdominal Aortogram;  Surgeon: Waynetta Sandy, MD;  Location: [facility];  Service: Cardiovascular;  Laterality: N/A;  . ABDOMINAL HYSTERECTOMY    . ANKLE FRACTURE SURGERY Right   . ANKLE HARDWARE REMOVAL Right   . APPENDECTOMY  ~ 1963  . BREAST BIOPSY Bilateral   . BREAST CYST EXCISION Bilateral    "not cancer"  . CATARACT EXTRACTION W/ INTRAOCULAR LENS  IMPLANT, BILATERAL    . CHOLECYSTECTOMY N/A 07/05/2014   Procedure: LAPAROSCOPIC CHOLECYSTECTOMY WITH INTRAOPERATIVE CHOLANGIOGRAM;  Surgeon: Gayland Curry, MD;  Location: [facility];  Service: General;  Laterality: N/A;  . EXCISIONAL HEMORRHOIDECTOMY    . FEMORAL-POPLITEAL BYPASS GRAFT Right 01/15/2017   Procedure: BYPASS GRAFT FEMORAL-POPLITEAL ARTERY RIGHT LEG;  Surgeon: Waynetta Sandy, MD;  Location: [facility]  OR;  Service: Vascular;  Laterality: Right;  . IR IMAGING GUIDED PORT INSERTION  03/08/2018  . IR US GUIDE VASC ACCESS LEFT  03/08/2018  . LAPAROSCOPIC CHOLECYSTECTOMY  07/05/2014  . LOWER EXTREMITY ANGIOGRAPHY Bilateral 01/07/2017   Procedure: Lower Extremity Angiography;  Surgeon: Waynetta Sandy, MD;  Location: [facility];  Service: Cardiovascular;  Laterality: Bilateral;  . MASS BIOPSY Right 12/25/2017   Procedure: INCISIONAL RIGHT NECK MASS BIOPSY;  Surgeon: Helayne Seminole, MD;  Location: [facility];  Service: ENT;  Laterality: Right;  . MULTIPLE TOOTH EXTRACTIONS  05/2013   "pulled my upper teeth"  . PILONIDAL CYST EXCISION       Social History   reports that she quit smoking about a year ago. Her smoking use included cigarettes. She has a 48.00 pack-year smoking history. She has never used smokeless tobacco. She reports that she does not drink alcohol or use drugs.   Family History   Her family history includes Cancer in her father; Cancer (age of onset: 67) in her mother; Diabetes in her father and mother; Hypertension in her father and mother.

## 2019-01-20 NOTE — Procedures (Addendum)
Thoracentesis Procedure Note  Pre-operative Diagnosis: Right pleural effusion  Post-operative Diagnosis: Right pleural effusion  Indications: Right pleural effusion with worsening shortness of breath  Informed consent was obtained, assisted by 2 nurses-[place] nurse, ICU charge nurse  Patient was placed on a monitor  Procedure Details  Ultrasound of the chest was performed -No significant fluid was noted on the left -No significant fluid was noted on the right, no free-flowing fluid was noted  Procedure was not performed  Will follow-up with a CT scan of the chest

## 2019-01-20 NOTE — Progress Notes (Signed)
Nutrition Follow-up  DOCUMENTATION CODES:   Non-severe (moderate) malnutrition in context of chronic illness  INTERVENTION:   Continue Ensure Enlive po TID, each supplement provides 350 kcal and 20 grams of protein  NUTRITION DIAGNOSIS:   Moderate Malnutrition related to chronic illness(Stage 4 Lung cancer) as evidenced by moderate muscle depletion, moderate fat depletion, severe muscle depletion.  Ongoing.  GOAL:   Patient will meet greater than or equal to 90% of their needs  Not meeting.  MONITOR:   PO intake, I & O's, Weight trends, Supplement acceptance  ASSESSMENT:   68 year old female w/ PMH of GERD, HTN, cholelithiasis, anxiety, stroke, and stage IV lung cancer currently undergoing chemotherapy and radiation. Pt was sent from cancer center w/ weakness, mental status changes, and complaints of chest pains for 2 days.   Patient ate 25% of breakfast this morning. She is drinking Ensure supplements. SLP evaluated on 2/18 & 2/18: pt with oropharyngeal dysphagia, severe aspiration risk. Recommended dysphagia 3 diet with nectar thick liquids. Pt wants to continue to eat despite high risk of aspiration.   No new weight has been measured since 2/12.   Medications: KCl Labs reviewed: Low K  Diet Order:   Diet Order            DIET DYS 3 Room service appropriate? Yes; Fluid consistency: Nectar Thick  Diet effective now              EDUCATION NEEDS:   No education needs have been identified at this time  Skin:  Skin Assessment: Reviewed RN Assessment  Last BM:  2/20  Height:   Ht Readings from Last 1 Encounters:  01/11/2019 5\' 1"  (1.549 m)    Weight:   Wt Readings from Last 1 Encounters:  01/08/2019 51.7 kg    Ideal Body Weight:  47.72 kg  BMI:  Body mass index is 21.54 kg/m.  Estimated Nutritional Needs:   Kcal:  1600-1800 kcal  Protein:  75-85g  Fluid:  >/= 1.6L  Clayton Bibles, MS, RD, LDN [facility] Pager:  [phone] After Hours Pager: [phone]

## 2019-01-20 NOTE — Progress Notes (Signed)
Ultrasonography of the chest was performed  Right side-no significant free-flowing fluid noted Left side-no significant fluid   CT scan reviewed-loculated pleural effusion, a significant portion of the fluid was loculated below the scapula  We will repeat CT scan of the chest secondary to worsening clinical status

## 2019-01-20 NOTE — Progress Notes (Signed)
Patient started coughing and spitting up thick mucous , suctioned her and checked vital signs,heart rate was 155,BP 168/98, sats 68% with 2L oxygen. Rapid Response team and  Dr Grandville Silos alerted. Patient was moved to higher level of care

## 2019-01-20 NOTE — Progress Notes (Signed)
CPT on hold due to pt condition.

## 2019-01-20 NOTE — Progress Notes (Signed)
CRITICAL VALUE ALERT  Critical Value:  Potassium 2.7  Date & Time Notied:  01/20/19 0630  Provider Notified: Baltazar Najjar, NP  Orders Received/Actions taken: Potassium 30 mEq PO Q4H x2 doses

## 2019-01-20 NOTE — Significant Event (Signed)
Rapid Response Event Note  Overview: Time Called: 1356 Arrival Time: 1400 Event Type: Respiratory Called by bedside RN in regards to patient decompensating. Immediately went into room 1616. Upon arrival, patient HR 140s, EKG being performed. Patient having audible rhonchi, and O2 Sats were 89% on 5-6L [place].   Initial Focused Assessment: Patient had increase work of breathing, O2 Sats were decreasing. Place patient on NRB. MD Grandville Silos paged. Notified Respiratory Therapy for transfer to SDU for BIPAP.   Interventions: NRB MD Grandville Silos ordered lasix 40mg  Trx to SDU for BIPAP         Sophiah Rolin C

## 2019-01-20 NOTE — Progress Notes (Signed)
Attempted echo.  Patient in the process of being cleaned up and requiring restraints due to combativeness.  Will return at a later time to perform echo.

## 2019-01-21 ENCOUNTER — Inpatient Hospital Stay (HOSPITAL_COMMUNITY): Payer: Medicare Other

## 2019-01-21 ENCOUNTER — Ambulatory Visit: Payer: Medicare Other

## 2019-01-21 DIAGNOSIS — R0602 Shortness of breath: Secondary | ICD-10-CM

## 2019-01-21 LAB — CBC WITH DIFFERENTIAL/PLATELET
Abs Immature Granulocytes: 0.07 10*3/uL (ref 0.00–0.07)
Basophils Absolute: 0 10*3/uL (ref 0.0–0.1)
Basophils Relative: 0 %
Eosinophils Absolute: 0 10*3/uL (ref 0.0–0.5)
Eosinophils Relative: 0 %
HCT: 24 % — ABNORMAL LOW (ref 36.0–46.0)
Hemoglobin: 7.8 g/dL — ABNORMAL LOW (ref 12.0–15.0)
Immature Granulocytes: 1 %
Lymphocytes Relative: 4 %
Lymphs Abs: 0.4 10*3/uL — ABNORMAL LOW (ref 0.7–4.0)
MCH: 28.1 pg (ref 26.0–34.0)
MCHC: 32.5 g/dL (ref 30.0–36.0)
MCV: 86.3 fL (ref 80.0–100.0)
Monocytes Absolute: 0.4 10*3/uL (ref 0.1–1.0)
Monocytes Relative: 4 %
Neutro Abs: 9.8 10*3/uL — ABNORMAL HIGH (ref 1.7–7.7)
Neutrophils Relative %: 91 %
Platelets: 256 10*3/uL (ref 150–400)
RBC: 2.78 MIL/uL — ABNORMAL LOW (ref 3.87–5.11)
RDW: 17.2 % — ABNORMAL HIGH (ref 11.5–15.5)
WBC: 10.7 10*3/uL — ABNORMAL HIGH (ref 4.0–10.5)
nRBC: 0 % (ref 0.0–0.2)

## 2019-01-21 LAB — BASIC METABOLIC PANEL
Anion gap: 5 (ref 5–15)
BUN: 23 mg/dL (ref 8–23)
CHLORIDE: 117 mmol/L — AB (ref 98–111)
CO2: 25 mmol/L (ref 22–32)
Calcium: 9.5 mg/dL (ref 8.9–10.3)
Creatinine, Ser: 0.53 mg/dL (ref 0.44–1.00)
GFR calc Af Amer: >60 mL/min (ref >60)
GFR calc non Af Amer: >60 mL/min (ref >60)
Glucose, Bld: 135 mg/dL — ABNORMAL HIGH (ref 70–99)
Potassium: 3.2 mmol/L — ABNORMAL LOW (ref 3.5–5.1)
Sodium: 147 mmol/L — ABNORMAL HIGH (ref 135–145)

## 2019-01-21 MED ORDER — DIPHENHYDRAMINE HCL 50 MG/ML IJ SOLN: 12.5000 mg | INTRAMUSCULAR | Status: DC | PRN

## 2019-01-21 MED ORDER — METHOCARBAMOL 1000 MG/10ML IJ SOLN
500.0000 mg | Freq: Four times a day (QID) | INTRAVENOUS | Status: DC | PRN
  Filled 2019-01-21: qty 5

## 2019-01-21 MED ORDER — SODIUM CHLORIDE 0.9 % IV SOLN
12.5000 mg | Freq: Four times a day (QID) | INTRAVENOUS | Status: DC | PRN
  Filled 2019-01-21: qty 0.5

## 2019-01-21 MED ORDER — HALOPERIDOL LACTATE 5 MG/ML IJ SOLN: 0.5000 mg | INTRAMUSCULAR | Status: DC | PRN

## 2019-01-21 MED ORDER — SODIUM CHLORIDE 0.9% FLUSH
3.0000 mL | Freq: Two times a day (BID) | INTRAVENOUS | Status: DC
  Administered 2019-01-21: 10 mL via INTRAVENOUS

## 2019-01-21 MED ORDER — ALUM & MAG HYDROXIDE-SIMETH 200-200-20 MG/5ML PO SUSP: 30.0000 mL | Freq: Four times a day (QID) | ORAL | Status: DC | PRN

## 2019-01-21 MED ORDER — LEVALBUTEROL HCL 0.63 MG/3ML IN NEBU
0.6300 mg | INHALATION_SOLUTION | RESPIRATORY_TRACT | Status: DC | PRN
  Administered 2019-01-21: 0.63 mg via RESPIRATORY_TRACT

## 2019-01-21 MED ORDER — BISACODYL 10 MG RE SUPP: 10.0000 mg | Freq: Every day | RECTAL | Status: DC

## 2019-01-21 MED ORDER — IPRATROPIUM BROMIDE 0.02 % IN SOLN
0.5000 mg | RESPIRATORY_TRACT | Status: DC | PRN
  Administered 2019-01-21: 0.5 mg via RESPIRATORY_TRACT

## 2019-01-21 MED ORDER — SODIUM CHLORIDE 0.9 % IV SOLN
1.0000 g | Freq: Three times a day (TID) | INTRAVENOUS | Status: DC
  Administered 2019-01-21: 1 g via INTRAVENOUS
  Filled 2019-01-21 (×5): qty 1

## 2019-01-21 MED ORDER — MAGIC MOUTHWASH W/LIDOCAINE
15.0000 mL | Freq: Four times a day (QID) | ORAL | Status: DC | PRN
  Filled 2019-01-21: qty 15

## 2019-01-21 MED ORDER — LORAZEPAM 1 MG PO TABS: 1.0000 mg | ORAL_TABLET | ORAL | Status: DC | PRN

## 2019-01-21 MED ORDER — HALOPERIDOL LACTATE 2 MG/ML PO CONC
0.5000 mg | ORAL | Status: DC | PRN
  Filled 2019-01-21: qty 0.3

## 2019-01-21 MED ORDER — ONDANSETRON HCL 4 MG/2ML IJ SOLN: 4.0000 mg | Freq: Four times a day (QID) | INTRAMUSCULAR | Status: DC | PRN

## 2019-01-21 MED ORDER — MORPHINE SULFATE (PF) 2 MG/ML IV SOLN: 1.0000 mg | INTRAVENOUS | Status: DC | PRN

## 2019-01-21 MED ORDER — LORAZEPAM 2 MG/ML IJ SOLN
1.0000 mg | INTRAMUSCULAR | Status: DC | PRN
  Administered 2019-01-21 (×2): 1 mg via INTRAVENOUS

## 2019-01-21 MED ORDER — GLYCOPYRROLATE 1 MG PO TABS: 1.0000 mg | ORAL_TABLET | ORAL | Status: DC | PRN

## 2019-01-21 MED ORDER — PANTOPRAZOLE SODIUM 40 MG IV SOLR
40.0000 mg | INTRAVENOUS | Status: DC
  Administered 2019-01-21: 40 mg via INTRAVENOUS
  Filled 2019-01-21: qty 40

## 2019-01-21 MED ORDER — SODIUM CHLORIDE 0.9 % IV SOLN: 250.0000 mL | INTRAVENOUS | Status: DC | PRN

## 2019-01-21 MED ORDER — ALBUTEROL SULFATE (2.5 MG/3ML) 0.083% IN NEBU: 2.5000 mg | INHALATION_SOLUTION | RESPIRATORY_TRACT | Status: DC | PRN

## 2019-01-21 MED ORDER — HALOPERIDOL 0.5 MG PO TABS: 0.5000 mg | ORAL_TABLET | ORAL | Status: DC | PRN

## 2019-01-21 MED ORDER — BIOTENE DRY MOUTH MT LIQD: 15.0000 mL | OROMUCOSAL | Status: DC | PRN

## 2019-01-21 MED ORDER — GLYCOPYRROLATE 0.2 MG/ML IJ SOLN: 0.2000 mg | INTRAMUSCULAR | Status: DC | PRN

## 2019-01-21 MED ORDER — LORAZEPAM 2 MG/ML IJ SOLN
1.0000 mg | INTRAMUSCULAR | Status: DC | PRN
  Filled 2019-01-21 (×2): qty 1

## 2019-01-21 MED ORDER — LORAZEPAM 2 MG/ML PO CONC: 1.0000 mg | ORAL | Status: DC | PRN

## 2019-01-21 MED ORDER — ONDANSETRON 4 MG PO TBDP: 4.0000 mg | ORAL_TABLET | Freq: Four times a day (QID) | ORAL | Status: DC | PRN

## 2019-01-21 MED ORDER — POLYVINYL ALCOHOL 1.4 % OP SOLN: 1.0000 [drp] | Freq: Four times a day (QID) | OPHTHALMIC | Status: DC | PRN

## 2019-01-21 MED ORDER — SODIUM CHLORIDE 0.9% FLUSH: 3.0000 mL | INTRAVENOUS | Status: DC | PRN

## 2019-01-21 MED ORDER — NYSTATIN 100000 UNIT/GM EX POWD: Freq: Three times a day (TID) | CUTANEOUS | Status: DC | PRN

## 2019-01-21 MED ORDER — BISACODYL 10 MG RE SUPP: 10.0000 mg | Freq: Every day | RECTAL | Status: DC | PRN

## 2019-01-21 MED ORDER — ACETAMINOPHEN 650 MG RE SUPP: 650.0000 mg | Freq: Four times a day (QID) | RECTAL | Status: DC | PRN

## 2019-01-21 MED ORDER — ACETAMINOPHEN 325 MG PO TABS: 650.0000 mg | ORAL_TABLET | Freq: Four times a day (QID) | ORAL | Status: DC | PRN

## 2019-01-21 MED ORDER — POTASSIUM CHLORIDE 10 MEQ/100ML IV SOLN
10.0000 meq | INTRAVENOUS | Status: AC
  Administered 2019-01-21 (×3): 10 meq via INTRAVENOUS
  Filled 2019-01-21 (×2): qty 100

## 2019-01-21 MED ORDER — GLYCOPYRROLATE 0.2 MG/ML IJ SOLN
0.2000 mg | INTRAMUSCULAR | Status: DC | PRN
  Administered 2019-01-21 (×2): 0.2 mg via INTRAVENOUS
  Filled 2019-01-21 (×2): qty 1

## 2019-01-21 NOTE — Progress Notes (Signed)
Pt began having increased WOB and HR was in the 150-160s. BP was 70s/40s. Paged NP and was given an order for 250 cc bolus. BP did not respond. I called Pts family. They spoke with night NP and decided to make the pt comfort care. Family came to bedside. I asked them again about giving morphine, even with a low BP. Family verbalized understanding and still wanted to the morphine to be given to keep her comfortable. Will continue to monitor

## 2019-01-21 NOTE — Progress Notes (Addendum)
PROGRESS NOTE    Faith [name]  CZY:[id number] DOB: 12/05/1950 DOA: 01/19/2019 PCP: [name], MD   Brief Narrative:  :68 y.o.femalewith medical history significant ofstage IV lung cancer diagnosed a year ago undergoing chemotherapy and radiation, chronic abdominal pain and lower back pain, bipolar disorder comes in with a couple of days of coughing that has been productive and nonbloody with associated subjective fevers and chills at home. Patient also reports shortness of breath. She was getting radiation today when they noticed her oxygen sats were a little low so they sent her to the emergency department for further evaluation for PE. Patient denies any lower extremity swelling or edema. She denies chest pain but does endorse right upper quadrant abdominal pain which is been present since her cancer is been found with mets. Patient found to have postobstructive pneumonia and referred for admission for such. She was hospitalized November but has not had recent a biotics. She is being covered with cefepime   Assessment & Plan:   Principal Problem:   Acute on chronic respiratory failure with hypoxia ([place]) Active Problems:   Right sided abdominal pain   Essential hypertension   Non-small cell carcinoma of right lung, stage 4 (HCC)   Palliative care encounter   Chronic pain   HCAP (healthcare-associated pneumonia)   Malnutrition of moderate degree   Malignant neoplasm of right lung (HCC)   Constipation   Hypomagnesemia   Goals of care, counseling/discussion   Tachycardia  1 acute respiratory failure with hypoxia Patient noted to suddenly go in acute respiratory failure with hypoxia the afternoon of 01/20/2019 with increased work of breathing, use of accessory muscles of respiration.  Patient with underlying stage IV lung cancer with what seemed like a worsening pleural effusion.  Patient on clinical exam with diffuse rhonchorous breath sounds.  Concern for possible volume  overload.  No weights have been obtained since admission.  I's and O's have not been correctly documented.  Patient transferred to the stepdown unit overnight.  ABG obtained with a pH of 7.1, PCO2 of 74, PO2 of 85.  Patient was placed on the BiPAP with some improvement with respiratory status.  CT chest pending.  Patient noted overnight to decompensate become hypotensive tachycardic and nurse practitioner spoke with patient's daughter in terms of transitioning to comfort measures.  Patient alert this morning however with poor air movement and diffuse rhonchi.  Continue IV steroids, scheduled nebs, IV antibiotics, Pulmicort, Brovana, Flonase.  Will have palliative care reassess. ??  Full comfort measures.  Continue supportive care. PCCM following.  Supportive care.  2.  Tachycardia Patient noted to be tachycardic with heart rates in the 140s.  EKG was concerning for possible A. fib however P waves noted on telemetry.  Heart rate improved with IV Lopressor.  Patient noted to get tachycardic again overnight with hypotension was given some boluses of fluid.  Heart rate improved this morning.  Palliative care reassess in.  Follow.  3 stage IV lung cancer with mediastinal and hilar node involvement/postobstructive pneumonitis Patient with ongoing chemotherapy and radiation.  CT chest showed a large necrotic neoplasm at the inferior right pulmonary hilum invading mediastinum 6 x 3.9 cm with increased central necrosis and new cavitation since prior study.  Increased atelectasis and consolidation of the right lower lobe, right middle lobe and additional infiltrate in the superior segment of the right lower lobe could represent postobstructive pneumonitis or lymphocytic tumor spread.  Increased right pleural effusion at least partially loculated. MRSA PCR was  negative.  Vancomycin was discontinued and patient currently on IV cefepime.  Patient noted to have started radiation this week.  Patient with continued  palliative radiation in-house.  Patient would like to have radiation done and get treated for her pneumonia and be discharged to a skilled nursing facility and then reassess once her initial discussion with palliative care.  Patient noted to have lots of cough with increased sputum production and coughing when she eats and as such speech therapy consulted.  Patient started on a dysphagia 3 diet.  Aspiration precautions.  Continue Mucinex.  Patient noted to decompensate in her respiratory status yesterday and had to be placed on the BiPAP and transferred to the stepdown unit.  Patient declined overnight however alert this morning.  Nurse practitioner covering for calls overnight discussed with patient's daughter about transitioning patient to comfort measures as patient deteriorated overnight.  Will have palliative care reassess today. ??  Transitioning to full comfort.  Follow.   4  Right pleural effusion Patient with progressive stage IV lung cancer with mediastinal and hilar node involvement.  CT chest and chest x-ray which was done consistent with increasing right pleural effusion.  Repeat chest x-ray with worsening effusion.  Case discussed with pulmonary who consulted on the patient did a bedside ultrasound however no significant fluid noted to be tapped.  CT chest ordered and pending.  Continue IV antibiotics.  Appreciate PCCM's input and recommendations.  5.  Hypertension Blood pressure noted to be borderline and hypotensive last night.  Continue to hold antihypertensive medications.   6.  Constipation Patient stated has not had a bowel movement since admission although she is on stool softeners.  Patient placed on a Dulcolax suppository and given a smog enema overnight and on MiraLAX however no bowel movement.  Increased MiraLAX to twice daily. Continued amitiza.  Manual disimpaction.  Patient has had smog enemas x2 with no significant bowel movement.  Patient given some magnesium citrate with no  bowel movement noted.  Continue Senokot-S.  Will give a Dulcolax suppository.  Follow for now.     7.  Severe deconditioning PT/OT.   8.  Anemia of chronic disease 1 unit packed red blood cells 01/17/2019.  Hemoglobin currently at 7.8.anemia panel consistent with anemia of chronic disease and iron deficiency anemia.  Follow H&H.  Once patient has improved from a respiratory standpoint may consider IV iron.  Follow.   9.  Hypokalemia/hypomagnesemia Potassium at 3.2.  Replete.   10.  Protein calorie malnutrition Nutritional supplementation.   DVT prophylaxis: SCD Code Status: DNR Family Communication: Updated patient.  No family at bedside. Disposition Plan: Remain in stepdown unit today pending reevaluation by palliative care.    Consultants:   Palliative care Dr. Domingo Cocking 01/15/2019   PCCM: Dr. Ander Slade 01/20/2019  Procedures:   CT abdomen and pelvis 01/08/2019  CT angiogram chest 01/21/2019  Chest x-ray 01/16/2019, 01/17/2019, 01/19/2019  CT chest pending  Antimicrobials:  IV cefepime 01/13/2019>>>> 01/21/2019  IV vancomycin 01/13/2019>>>> 01/14/2019   Subjective: Patient decompensated yesterday afternoon and had to be transferred to the stepdown unit in acute respiratory distress and placed on the BiPAP.  Patient noted to be tachycardic with some respiratory distress.   Patient's condition overnight noted to have worsened with patient noted to be hypotensive with tachycardia and given some boluses of IV fluids however no significant response to IV fluids.  NP overnight spoke with patient's daughter due to patient's worsening condition and overall poor prognosis and discussed transitioning to comfort  care.  Patient alert this morning asking for Jell-O.  States still with shortness of breath.  Denies any chest pain.  No bowel movement.  Objective: Vitals:   01/21/19 0404 01/21/19 0500 01/21/19 0800 01/21/19 0842  BP: 122/66 (!) 103/52    Pulse: 93 82    Resp: (!) 28 20      Temp:   98 F (36.7 C)   TempSrc:   Oral   SpO2: 98% 98%  94%  Weight:      Height:        Intake/Output Summary (Last 24 hours) at 01/21/2019 1045 Last data filed at 01/21/2019 0500 Gross per 24 hour  Intake 0 ml  Output 1525 ml  Net -1525 ml   Filed Weights   01/13/2019 1940  Weight: 51.7 kg    Examination:  General exam: Some use of accessory muscles of respiration. Respiratory system: Coarse rhonchorous breath sounds mainly in the bases.  Some use of accessory muscles of respiration.  Poor air movement.  Speaking in some choppy sentences.  Cardiovascular system: Regular rate and rhythm no murmurs rubs or gallops.  No JVD.  No lower extremity edema. Gastrointestinal system: Abdomen is mildly distended, soft, nontender, no rebound, no guarding.  Central nervous system: Alert and oriented. No focal neurological deficits. Extremities: Symmetric 5 x 5 power. Skin: No rashes, lesions or ulcers Psychiatry: Judgement and insight appear normal. Mood & affect appropriate.     Data Reviewed: I have personally reviewed following labs and imaging studies  CBC: Recent Labs  Lab 01/18/19 0606 01/19/19 1135 01/20/19 0529 01/20/19 1623 01/21/19 0827  WBC 13.6* 14.8* 13.1* 13.8* 10.7*  NEUTROABS  --  13.3* 11.3* 12.6* 9.8*  HGB 9.3* 8.5* 8.2* 9.3* 7.8*  HCT 27.6* 25.4* 24.9* 29.8* 24.0*  MCV 85.2 85.8 86.2 88.4 86.3  PLT 278 267 248 304 662   Basic Metabolic Panel: Recent Labs  Lab 01/16/19 0625 01/19/19 1135 01/20/19 0529 01/20/19 1623 01/21/19 0827  NA 139 141 142 147* 147*  K 3.7 3.0* 2.7* 4.1 3.2*  CL 113* 112* 112* 113* 117*  CO2 22 26 24 28 25   GLUCOSE 119* 118* 125* 166* 135*  BUN 13 13 15 17 23   CREATININE 0.48 0.37* 0.45 0.52 0.53  CALCIUM 10.2 9.3 8.8* 8.7* 9.5  MG  --  1.7 2.3  --   --    GFR: Estimated Creatinine Clearance: 52.2 mL/min (by C-G formula based on SCr of 0.53 mg/dL). Liver Function Tests: Recent Labs  Lab 01/16/19 0625 01/19/19 1135  01/20/19 0529  AST 56* 21 20  ALT 37 28 24  ALKPHOS 133* 96 88  BILITOT 2.0* 1.6* 1.7*  PROT 6.0* 5.8* 6.0*  ALBUMIN 2.3* 2.0* 2.1*   No results for input(s): LIPASE, AMYLASE in the last 168 hours. No results for input(s): AMMONIA in the last 168 hours. Coagulation Profile: No results for input(s): INR, PROTIME in the last 168 hours. Cardiac Enzymes: Recent Labs  Lab 01/20/19 1623 01/20/19 2030  TROPONINI <0.03 <0.03   BNP (last 3 results) No results for input(s): PROBNP in the last 8760 hours. HbA1C: No results for input(s): HGBA1C in the last 72 hours. CBG: No results for input(s): GLUCAP in the last 168 hours. Lipid Profile: No results for input(s): CHOL, HDL, LDLCALC, TRIG, CHOLHDL, LDLDIRECT in the last 72 hours. Thyroid Function Tests: No results for input(s): TSH, T4TOTAL, FREET4, T3FREE, THYROIDAB in the last 72 hours. Anemia Panel: Recent Labs    01/20/19 0536  VITAMINB12 822  FOLATE 12.3  FERRITIN 1,798*  TIBC 127*  IRON 16*   Sepsis Labs: No results for input(s): PROCALCITON, LATICACIDVEN in the last 168 hours.  Recent Results (from the past 240 hour(s))  Blood culture (routine x 2)     Status: None   Collection Time: 01/05/2019  2:38 PM  Result Value Ref Range Status   Specimen Description   Final    BLOOD LEFT CHEST Performed at [facility] [address], [place], [place] [address]    Special Requests   Final    BOTTLES DRAWN AEROBIC AND ANAEROBIC Blood Culture adequate volume Performed at [facility] [address], [place], [place] [address]    Culture   Final    NO GROWTH 5 DAYS Performed at [facility], [place] [address] [place], [place] [address]    Report Status 01/17/2019 FINAL  Final  Blood culture (routine x 2)     Status: None   Collection Time: 01/09/2019  3:28 PM  Result Value Ref Range Status   Specimen Description   Final    BLOOD RIGHT FOREARM Performed at [facility] [address], [place], [place] [address]    Special Requests   Final    BOTTLES DRAWN AEROBIC AND ANAEROBIC Blood Culture adequate volume Performed at [facility] [address], [place], [place] [address]    Culture   Final    NO GROWTH 5 DAYS Performed at [facility], [place] [address] [place], [place] [address]    Report Status 01/17/2019 FINAL  Final  Respiratory Panel by PCR     Status: None   Collection Time: 01/13/19  8:55 AM  Result Value Ref Range Status   Adenovirus NOT DETECTED NOT DETECTED Final   Coronavirus 229E NOT DETECTED NOT DETECTED Final    Comment: (NOTE) The Coronavirus on the Respiratory Panel, DOES NOT test for the novel  Coronavirus (2019 nCoV)    Coronavirus HKU1 NOT DETECTED NOT DETECTED Final   Coronavirus NL63 NOT DETECTED NOT DETECTED Final   Coronavirus OC43 NOT DETECTED NOT DETECTED Final   Metapneumovirus NOT DETECTED NOT DETECTED Final   Rhinovirus / Enterovirus NOT DETECTED NOT DETECTED Final   Influenza A NOT DETECTED NOT DETECTED Final   Influenza B NOT DETECTED NOT DETECTED Final   Parainfluenza Virus 1 NOT DETECTED NOT DETECTED Final   Parainfluenza Virus 2 NOT DETECTED NOT DETECTED Final   Parainfluenza Virus 3 NOT DETECTED NOT DETECTED Final   Parainfluenza Virus 4 NOT DETECTED NOT DETECTED Final   Respiratory Syncytial Virus NOT DETECTED NOT DETECTED Final   Bordetella pertussis NOT DETECTED NOT DETECTED Final   Chlamydophila pneumoniae NOT DETECTED NOT DETECTED Final   Mycoplasma pneumoniae NOT DETECTED NOT DETECTED Final    Comment: Performed at [facility], [place] [address] [place], [place] [address]  MRSA PCR Screening     Status: None   Collection Time: 01/13/19  8:55 AM  Result Value Ref Range Status   MRSA by PCR NEGATIVE NEGATIVE Final    Comment:        The GeneXpert MRSA Assay (FDA approved for NASAL specimens only), is one component of a comprehensive MRSA  colonization surveillance program. It is not intended to diagnose MRSA infection nor to guide or monitor treatment for MRSA infections. Performed at [facility], [place] [address] [place], [place] [address]  Radiology Studies: Dg Chest 2 View  Result Date: 01/19/2019 CLINICAL DATA:  Hypoxia EXAM: CHEST - 2 VIEW COMPARISON:  01/17/2019 FINDINGS: LEFT jugular central venous catheter with tip projecting over SVC. Enlargement of cardiac silhouette with pulmonary vascular congestion. Moderate RIGHT pleural effusion and basilar atelectasis versus consolidation. Subsegmental atelectasis at LEFT base. New radiopacities identified at the RIGHT lung base, consistent with aspirated contrast material presumed due to earlier swallowing function study. No pneumothorax or acute osseous findings. IMPRESSION: Moderate RIGHT pleural effusion with atelectasis versus consolidation in lower RIGHT lung. Subsegmental atelectasis LEFT base. New aspirated contrast material at inferior RIGHT hemithorax. Electronically Signed   By: Lavonia Dana M.D.   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 EXAM: CT CHEST WITHOUT CONTRAST TECHNIQUE: Multidetector CT imaging of the chest was performed following the standard protocol without IV contrast. COMPARISON:  01/11/2019, 12/14/2018 and earlier, including PET-CT 01/18/2018. FINDINGS: Cardiovascular: Heart moderately enlarged, unchanged. No pericardial effusion. Moderate to severe three-vessel coronary atherosclerosis. Aortic annular calcification. Moderate atherosclerosis involving the thoracic and proximal abdominal aorta. Mediastinum/Nodes:  Large necrotic mass involving the RIGHT LOWER LOBE with extension into the RIGHT hilum and into the mediastinum which is difficult to measure on the unenhanced examination. Enlarging station 4L lymph node,, enlarging station 5 AP window lymph node, enlarging station 4R lymph nodes, and enlarging station 2R and 2L lymph nodes since the examination 9 days ago. No axillary lymphadenopathy. Gas in the mid mid esophagus just below the level of the hilum which is contiguous with the necrotic mass, and there is high attenuation material in the mass and in the completely collapsed RIGHT LOWER LOBE which is a new finding. The mass causes marked compression of the esophagus. Lungs/Pleura: Complete consolidation of the RIGHT LOWER LOBE and the RIGHT MIDDLE LOBE, with high attenuation material in the collapsed lung. Airspace consolidation with air bronchograms involving the LEFT LOWER LOBE. Patchy airspace consolidation involving the UPPER lobes bilaterally. No evidence of interstitial pulmonary edema. Large high attenuation RIGHT pleural effusion which has increased in size since the examination 9 days ago. No visible LEFT pleural effusion. Upper Abdomen: High attenuation contrast material in the colon from the speech swallow performed 2 days ago. No acute findings in visualized upper abdomen allowing for the unenhanced technique. Musculoskeletal: Mild midthoracic spondylosis.  No acute findings. IMPRESSION: 1. 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 2. Marked progression of mediastinal metastatic disease since the CT 9 days ago. 3. Large likely malignant RIGHT pleural effusion. 4. Complete atelectasis of the RIGHT LOWER LOBE and RIGHT MIDDLE LOBE. Pneumonia involving the LEFT LOWER LOBE and both UPPER lobes. I personally telephoned  these results to Dr Sherrilyn Rist at the time of interpretation on 01/21/2019 at 10:20 a.m. Electronically Signed   By: Evangeline Dakin M.D.   On: 01/21/2019 10:27   Dg Chest Port 1 View  Result Date: 01/20/2019 CLINICAL DATA:  68 year old female with history of acute respiratory distress. EXAM: PORTABLE CHEST 1 VIEW COMPARISON:  Chest x-ray 01/19/2019. FINDINGS: Large right pleural  effusion, slightly increased compared to the prior study. Opacity in the base of the right hemithorax may reflect underlying atelectasis and/or consolidation. Left lung is clear. No left pleural effusion. No evidence of pulmonary edema. Heart size is mildly enlarged. Upper mediastinal contours are within normal limits. Aortic atherosclerosis. Left internal jugular single-lumen power porta cath with tip terminating in the superior cavoatrial junction. IMPRESSION: 1. Enlarging chronic right pleural effusion with associated areas of atelectasis and/or consolidation in the right lung base. Known right lower lobe mass is poorly demonstrated on today's chest radiograph. 2. Mild cardiomegaly. 3. Aortic atherosclerosis. Electronically Signed   By: Vinnie Langton M.D.   On: 01/20/2019 14:30        Scheduled Meds: . arformoterol  15 mcg Nebulization BID  . bisacodyl  10 mg Oral Daily  . budesonide (PULMICORT) nebulizer solution  0.5 mg Nebulization BID  . clopidogrel  75 mg Oral Daily  . EPINEPHrine      . feeding supplement (ENSURE ENLIVE)  237 mL Oral BID BM  . fluticasone  1 spray Each Nare Daily  . gabapentin  300 mg Oral TID  . guaiFENesin  1,200 mg Oral BID  . ipratropium  0.5 mg Nebulization Q6H  . levalbuterol  0.63 mg Nebulization Q6H  . loratadine  10 mg Oral Daily  . lubiprostone  24 mcg Oral BID WC  . metoprolol tartrate  5 mg Intravenous Once  . milk and molasses  1 enema Rectal Once  . pantoprazole  40 mg Oral Daily  . polyethylene glycol  17 g Oral BID  . potassium chloride  40 mEq Oral Daily  .  senna-docusate  3 tablet Oral QHS  . sodium chloride flush  3 mL Intravenous Q12H  . tiZANidine  4 mg Oral BID   Continuous Infusions: . sodium chloride 250 mL (01/18/19 2128)     LOS: 9 days    Time spent: 40 minutes    Irine Seal, MD [facility]  If 7PM-7AM, please contact night-coverage [email or web address] 01/21/2019, 10:45 AM

## 2019-01-21 NOTE — Progress Notes (Signed)
Patient called nurse to room c/o SOB and very anxious. PRN Ativan and Morphine administered. Tan secretions noted in oral suction tubing and patient continues to c/o SOB that increases with oral intake. Patient attempting to get OOB and restless. Dr Grandville Silos on unit and made aware. Patient has been made NPO. Nurse explained to patient that oral intake was not safe and possibly contributing  to SOB and cough, patient agreed. Will continue to monitor patient.

## 2019-01-21 NOTE — Progress Notes (Signed)
Patient arrived to unit requesting ice chips and juice. Dr. Grandville Silos on unit and made aware. Orders received for ice chips and orals from floor stock.

## 2019-01-21 NOTE — Progress Notes (Signed)
Addendum: 10:50 AM. Spoke with PCCM, Dr. 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  Marked progression of mediastinal metastatic disease since CT scan done 9 days ago.  Large likely malignant right pleural effusion.  Complete atelectasis of the right lower lobe and right middle lobe.  Pneumonia involving the left lower lobe and both upper lobes.  Will make patient n.p.o. for now until reassessment and family discussion with palliative care concerning goals of care.  If patient is made full comfort measures and patient and family accepts risks of aspiration then could place on comfort feeds.  Continue empiric IV antibiotics for now.  Follow.  No charge.

## 2019-01-21 NOTE — Progress Notes (Signed)
Speech Language Pathology Discharge Patient Details Name: Faith [name] MRN: [id number] DOB: 07-Apr-1951 Today's Date: 01/21/2019 Time:  -     Patient discharged from SLP services secondary to medical decline - will need to re-order SLP to resume therapy services. Noted increased HR this am and increased WOB. She is now comfort care. ST will sign off. If pt improves and we can assist with swallow, please reorder. Thank you   Please see latest therapy progress note for current level of functioning and progress toward goals.    Progress and discharge plan discussed with patient and/or caregiver: Patient unable to participate in discharge planning and no caregivers available  GO     Faith [name] 01/21/2019, 8:20 AM   Faith [name] Colvin Caroli.Ed Risk analyst [phone] Office [phone]

## 2019-01-21 NOTE — Progress Notes (Signed)
Daily Progress Note   Patient Name: Faith [name]       Date: 01/21/2019 DOB: February 23, 1951  Age: 68 y.o. MRN#: [id number] Attending Physician: Eugenie Filler, MD Primary Care Physician: [name], MD Admit Date: 01/28/2019  Reason for Consultation/Follow-up: Establishing goals of care, Non pain symptom management and Pain control  Subjective: Overnight events noted, the patient had resp distress and low BP, patient was transferred to step down, required BIPAP and initially for Lasix, then got some gentle IVF resuscitation.   Now, she is on high flow O2, asking for something more to eat/drink.   Palliative follow up continues.   I have discussed multiple times with her daughter face to face several times today, also discussed with bedside RN as well as with TRH MD Dr Grandville Silos.    She has some dyspnea.      Length of Stay: 9  Current Medications: Scheduled Meds:  . arformoterol  15 mcg Nebulization BID  . bisacodyl  10 mg Oral Daily  . budesonide (PULMICORT) nebulizer solution  0.5 mg Nebulization BID  . clopidogrel  75 mg Oral Daily  . feeding supplement (ENSURE ENLIVE)  237 mL Oral BID BM  . fluticasone  1 spray Each Nare Daily  . gabapentin  300 mg Oral TID  . ipratropium  0.5 mg Nebulization Q6H  . levalbuterol  0.63 mg Nebulization Q6H  . lubiprostone  24 mcg Oral BID WC  . metoprolol tartrate  5 mg Intravenous Once  . milk and molasses  1 enema Rectal Once  . pantoprazole (PROTONIX) IV  40 mg Intravenous Q24H  . polyethylene glycol  17 g Oral BID  . senna-docusate  3 tablet Oral QHS  . sodium chloride flush  3 mL Intravenous Q12H  . tiZANidine  4 mg Oral BID    Continuous Infusions: . sodium chloride 250 mL (01/18/19 2128)  . ceFEPime (MAXIPIME) IV    .  potassium chloride      PRN Meds: sodium chloride, diphenhydrAMINE, guaiFENesin-dextromethorphan, ibuprofen, ipratropium, levalbuterol, linaclotide, lip balm, mometasone, morphine injection, oxyCODONE, sodium chloride flush  Physical Exam     General: Alert, awake,  appears comfortable HEENT: No bruits, no goiter, no JVD Heart: Regular Lungs: Coarse, diminished air movement Abdomen: Nondistended  Ext: No significant edema Skin: Warm and dry Neuro: Grossly  intact, nonfocal.  Slight left eye deviation.      Vital Signs: BP 138/60   Pulse 96   Temp 98 F (36.7 C) (Oral)   Resp (!) 34   Ht 5\' 1"  (1.549 m)   Wt 51.7 kg   SpO2 96%   BMI 21.54 kg/m  SpO2: SpO2: 96 % O2 Device: O2 Device: Nasal Cannula O2 Flow Rate: O2 Flow Rate (L/min): 5 L/min  Intake/output summary:   Intake/Output Summary (Last 24 hours) at 01/21/2019 1148 Last data filed at 01/21/2019 0500 Gross per 24 hour  Intake 0 ml  Output 1525 ml  Net -1525 ml   LBM: Last BM Date: 01/21/19 Baseline Weight: Weight: 51.7 kg Most recent weight: Weight: 51.7 kg      PPS 30% Palliative Assessment/Data:    Flowsheet Rows     Most Recent Value  Intake Tab  Referral Department  Hospitalist  Unit at Time of Referral  Oncology Unit  Palliative Care Primary Diagnosis  Cancer  Date Notified  01/13/19  Palliative Care Type  New Palliative care  Reason for referral  Clarify Goals of Care  Date of Admission  01/28/2019  Date first seen by Palliative Care  01/15/19  # of days Palliative referral response time  2 Day(s)  # of days IP prior to Palliative referral  1  Clinical Assessment  Pain Max last 24 hours  8  Pain Min Last 24 hours  0  Dyspnea Max Last 24 Hours  8  Dyspnea Min Last 24 hours  0  Psychosocial & Spiritual Assessment  Palliative Care Outcomes  Patient/Family meeting held?  Yes  Who was at the meeting?  Patient, daughter  Palliative Care Outcomes  Improved non-pain symptom therapy, Clarified goals  of care      Patient Active Problem List   Diagnosis Date Noted  . Acute on chronic respiratory failure with hypoxia ([place]) 01/20/2019  . Tachycardia 01/20/2019  . Goals of care, counseling/discussion   . Constipation   . Hypomagnesemia   . Malignant neoplasm of right lung ([place])   . Malnutrition of moderate degree 01/14/2019  . HCAP (healthcare-associated pneumonia) 01/11/2019  . Dysphagia 12/16/2018  . Fecal impaction in rectum ([place]) 10/20/2018  . Fecal impaction of rectum ([place]) 10/20/2018  . Chronic pain 10/20/2018  . Port-A-Cath in place 04/28/2018  . Encounter for antineoplastic chemotherapy 04/21/2018  . Palliative care encounter 04/21/2018  . Non-small cell carcinoma of right lung, stage 4 ([place]) 02/23/2018  . Secondary and unspecified malignant neoplasm of lymph nodes of head, face and neck ([place]) 01/05/2018  . Hyperkalemia 12/23/2017  . HLD (hyperlipidemia) 12/22/2017  . GERD (gastroesophageal reflux disease) 12/22/2017  . History of stroke 12/22/2017  . Tobacco abuse 12/22/2017  . Mass in neck 12/22/2017  . Hypokalemia 12/22/2017  . Hypercalcemia 12/22/2017  . Nausea vomiting and diarrhea 12/22/2017  . AKI (acute kidney injury) ([place]) 12/22/2017  . Stroke-like symptom 02/14/2017  . Low potassium syndrome 02/14/2017  . PAD (peripheral artery disease) ([place]) 01/15/2017  . Nonhealing nonsurgical wound 12/05/2016  . Essential hypertension 07/06/2014  . Depression 07/06/2014  . Back pain 07/06/2014  . Chronic cholecystitis 07/05/2014  . Right sided abdominal pain 05/11/2014  . Cholelithiasis 05/11/2014    Palliative Care Assessment & Plan   Assessment: 68 y.o. female  with past medical history of stage IV lung cancer diagnosed 1 year ago and undergoing treatment with Keytruda and radiation, chronic abdominal and lower back pain, bipolar disorder admitted on 01/01/2019  with obstructive pneumonia.  Palliative consulted for goals of care.  Events over the past 24 hours  noted, the patient had resp decline, required BIPAP, had low BP requiring IVF, also had CT chest showing fistula between the esophagus and lung mass. Also noted to have marked progression of mediastinal metastatic disease since last CT scan, also with PNA in LLL as well as both upper lobes.     Recommendations/Plan:  In light of events in the past 24 hours clinical course, I have had recurrent discussions with the patient's daughter, as well as the patient about overall goals of care.   Goals, wishes and values discussed in detail.   Patient states, " God has the last say so, but I'm ready whenever, I have lived a long good life."  Daughter who is at the bedside, is the patient's only child. we talked about comfort measures, residential hospice and symptom management including comfort feeds.   Stopping radiation treatments, to focus exclusively on comfort measures.   IV Morphine PRN and other non pain symptom management.     Code Status:    Code Status Orders  (From admission, onward)         Start     Ordered   01/15/19 0738  Do not attempt resuscitation (DNR)  Continuous    Question Answer Comment  In the event of cardiac or respiratory ARREST Do not call a "code blue"   In the event of cardiac or respiratory ARREST Do not perform Intubation, CPR, defibrillation or ACLS   In the event of cardiac or respiratory ARREST Use medication by any route, position, wound care, and other measures to relive pain and suffering. May use oxygen, suction and manual treatment of airway obstruction as needed for comfort.      01/15/19 0737        Code Status History    Date Active Date Inactive Code Status Order ID Comments User Context   01/15/2019 1941 01/15/2019 0737 Full Code [id number]  Phillips Grout, MD ED   10/20/2018 2346 10/25/2018 1410 Full Code [id number]  Vianne Bulls, MD ED   12/22/2017 2351 12/25/2017 1958 Full Code [id number]  Ivor Costa, MD ED   02/14/2017 0741 02/18/2017 2028  Full Code [id number]  Elwin Mocha, MD ED   01/15/2017 1614 01/19/2017 1738 Full Code [id number]  Waynetta Sandy, MD Inpatient   01/07/2017 0949 01/07/2017 1710 Full Code [id number]  Waynetta Sandy, MD Inpatient   07/05/2014 1118 07/06/2014 1444 Full Code [id number]  Greer Pickerel, MD Inpatient       Prognosis:   less than 2 weeks.   Discharge Planning:  Residential hospice over the weekend.   Care plan was discussed with patient, daughter, bedside RN, TRH MD.   Thank you for allowing the Palliative Medicine Team to assist in the care of this patient.   Total Time 80 Prolonged Time Billed No      Greater than 50%  of this time was spent counseling and coordinating care related to the above assessment and plan.  Loistine Chance, MD [phone] Please contact Palliative Medicine Team phone at [phone] for questions and concerns.  Time in 10 am Time out 11.20 am.

## 2019-01-22 ENCOUNTER — Encounter: Payer: Self-pay | Admitting: Internal Medicine

## 2019-01-22 DIAGNOSIS — J189 Pneumonia, unspecified organism: Secondary | ICD-10-CM | POA: Diagnosis present

## 2019-01-22 DIAGNOSIS — J690 Pneumonitis due to inhalation of food and vomit: Principal | ICD-10-CM

## 2019-01-22 DIAGNOSIS — J441 Chronic obstructive pulmonary disease with (acute) exacerbation: Secondary | ICD-10-CM | POA: Diagnosis not present

## 2019-01-22 DIAGNOSIS — J860 Pyothorax with fistula: Secondary | ICD-10-CM

## 2019-01-24 ENCOUNTER — Ambulatory Visit: Payer: Medicare Other

## 2019-01-25 ENCOUNTER — Ambulatory Visit: Payer: Medicare Other

## 2019-01-25 ENCOUNTER — Encounter: Payer: Self-pay | Admitting: Radiation Oncology

## 2019-01-25 NOTE — Progress Notes (Signed)
  Radiation Oncology         [phone] ________________________________  Name: Faith [name] MRN: [id number]  Date: 01/25/2019  DOB: 1951-09-11  End of Treatment Note  Diagnosis:   Metastatic poorly differentiated carcinoma highly suspicious for primary lung cancer     Indication for treatment:  Palliative       Radiation treatment dates:   01/10/2019 - 01/19/2019  Site/dose:   Chest / received 21 Gy out of a prescribed 30 Gy in 10 fractions  Beams/energy:   3D, photons / 6X, 10X, 15X  Narrative: The patient's health declined during treatment, and she subsequently passed away.  Plan: The patient is deceased.  -----------------------------------  Eppie Gibson, MD  This document serves as a record of services personally performed by Eppie Gibson, MD. It was created on her behalf by Wilburn Mylar, a trained medical scribe. The creation of this record is based on the scribe's personal observations and the provider's statements to them. This document has been checked and approved by the attending provider.

## 2019-01-26 ENCOUNTER — Ambulatory Visit: Admission: RE | Admit: 2019-01-26 | Payer: Medicare Other | Source: Ambulatory Visit

## 2019-01-27 ENCOUNTER — Other Ambulatory Visit: Payer: Medicare Other

## 2019-01-27 ENCOUNTER — Ambulatory Visit: Payer: Medicare Other | Admitting: Internal Medicine

## 2019-01-27 ENCOUNTER — Ambulatory Visit: Payer: Medicare Other

## 2019-01-30 NOTE — Death Summary Note (Signed)
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 David  Faith [name] is a 68 y.o. female with medical history significant of stage IV lung cancer diagnosed a year ago undergoing chemotherapy and radiation, chronic abdominal pain and lower back pain, bipolar disorder comes in with a couple of days of coughing that has been productive and nonbloody with associated subjective fevers and chills at home.  Patient also reports shortness of breath.  She was getting radiation today when they noticed her oxygen sats were a little low so they sent her to the emergency department for further evaluation for PE.  Patient denied any lower extremity swelling or edema.  She denies chest pain but does endorse right upper quadrant abdominal pain which is been present since her cancer is been found with mets.  Patient found to have postobstructive pneumonia and referred for admission for such.  She was hospitalized November but has not had recent a biotics.  She is being covered with cefepime and vancomycin.  Hospital Course:  1 acute respiratory failure with hypoxia secondary to fistula between esophagus and large necrotic  neoplasm and inferior right lung lobe. Patient noted to suddenly go in acute respiratory failure with hypoxia the afternoon of 01/20/2019 with increased work of breathing, use of accessory muscles of respiration.  Patient with underlying stage IV lung cancer with what seemed like a worsening pleural effusion.  Patient on clinical exam with diffuse rhonchorous breath sounds.  Concern for possible volume overload.  Patient transferred to the stepdown unit overnight.  ABG obtained with a pH of 7.1, PCO2 of 74, PO2 of 85.  Patient was placed on the BiPAP with some improvement with respiratory status.  CT chest showed a fistula between esophagus and large necrotic mass in the right lower lobe extending to the mediastinum with high attenuation barium in the necrotic mass in the right lower lobe.  Patient's condition declined and patient decompensated.  Palliative care reassess patient and patient was subsequently transition to full comfort care measures.  Patient was pronounced dead at 0325 hrs. on February 04, 2019.  May her soul rest in peace.   2.  Tachycardia Patient noted to be tachycardic with heart rates in the 140s.  EKG was concerning for possible A. fib however P waves noted on telemetry.  Heart rate improved with IV Lopressor.  Patient noted to get tachycardic again overnight with hypotension was given some boluses of fluid.  Heart rate improved.patient's condition deteriorated.  CT chest which was done noted a fistula  between esophagus and large necrotic neoplasm at the inferior right pulmonary hilum invading mediastinum 6 x 3.9 cm with increased central necrosis and new cavitation since prior study.  Patient was  subsequently made full comfort care.  Patient was pronounced dead at 0325 hrs. on 01-25-2019.    3 stage IV lung cancer with mediastinal and hilar node involvement/postobstructive pneumonitis Patient with ongoing chemotherapy and radiation prior to admission.  CT chest showed a fistula between  esophagus and large necrotic neoplasm at the inferior right pulmonary hilum invading mediastinum 6 x 3.9 cm with increased central necrosis and new cavitation since prior study.  Increased atelectasis and consolidation of the right lower lobe, right middle lobe and additional infiltrate in the superior segment of the right lower lobe could represent postobstructive pneumonitis or lymphocytic tumor spread.  Increased right pleural effusion at least partially loculated. MRSA PCR was negative.  Patient initially placed empirically on IV antibiotics for postobstructive pneumonitis. Vancomycin was discontinued and patient was maintained on IV cefepime.  Patient noted to have started radiation the week prior to admission.  Patient with continued palliative radiation in-house.  Patient would like to have radiation done and get treated for her pneumonia and be discharged to a skilled nursing facility and then reassess once her initial discussion with palliative care.  Patient noted to have lots of cough with increased sputum production and coughing when she eats and as such speech therapy consulted.  Patient started on a dysphagia 3 diet.  Aspiration precautions and Mucinex.. Patient noted to decompensate in her respiratory status  and had to be placed on the BiPAP and transferred to the stepdown unit.  Patient condition worsened the night of 01/20/2019.  Nurse practitioner covering for calls overnight discussed with patient's daughter about transitioning patient to comfort measures as patient deteriorated overnight.  Palliative care reassess patient.  CT chest which was done noted a fistula between the esophagus and large necrotic mass in the right lower lobe extending to the mediastinum with market progression of mediastinal metastatic disease.  Patient was subsequently transition to full comfort care.   Patient deteriorated and was pronounced dead at 0325 hours the morning of 2019-01-25.    4  Right pleural  effusion Patient with progressive stage IV lung cancer with mediastinal and hilar node involvement.  CT chest and chest x-ray which was done consistent with increasing right pleural effusion.  Repeat chest x-ray with worsening effusion.  Case discussed with pulmonary who consulted on the patient did a bedside ultrasound however no significant fluid noted to be tapped.  CT chest ordered and was consistent with a fistula between esophagus and large necrotic mass in the right lower lobe with market progression of mediastinal metastatic disease.  Palliative care consulted.  Patient was subsequently transitioned to comfort care.  5.  Hypertension Blood pressure noted to be borderline and hypotensive as patient's condition deteriorated.  Antihypertensive medications were held.  Patient subsequently made comfort care.    6.  Constipation Patient stated has not had a bowel movement since admission although she is on stool softeners.  Patient placed on a Dulcolax suppository and given a smog enema overnight and on MiraLAX however no bowel movement.  Increased MiraLAX to twice daily. Continued amitiza.  Manual disimpaction.  Patient has had smog enemas x2 with no significant bowel movement.  Patient given some magnesium citrate with no bowel movement noted.    Patient also maintained on Senokot-S.  Patient's condition deteriorated and patient was subsequently made comfort measures.   7.  Severe deconditioning Patient subsequently made comfort measures.  8.  Anemia of chronic disease 1 unit packed red blood cells was transfused on 01/17/2019.  Hemoglobin remained  stable between 7-8,.anemia panel consistent with anemia of chronic disease and iron deficiency anemia.   9.  Hypokalemia/hypomagnesemia Secondary to diuresis repleted.    10.  Protein calorie malnutrition Patient initially placed on nutritional supplementation however patient noted to have a fistula between esophagus and large right necrotic  mass in the right lower lobe and patient subsequently made comfort care.   Time: 50 mins  Signed:  Irine Seal  [facility] 02-11-19, 7:37 AM   No charge

## 2019-01-30 NOTE — Progress Notes (Signed)
Faith [name] expired at [phone]. This patient pronounced by two RNs, this nurse Oletha Cruel RN and [place].

## 2019-01-30 DEATH — deceased

## 2019-02-25 NOTE — Therapy (Signed)
[facility] [address] [place], [place], [address] Phone: [phone]   Fax:  [phone]  Patient Details  Name: Faith [name] MRN: [id number] Date of Birth: [birth year] Referring Provider:  .Eppie Gibson, MD   SPEECH THERAPY DISCHARGE SUMMARY  Visits from [facility] of Care: 1  Current functional level related to goals / functional outcomes: Pt arrived 22 minutes late for her only scheduled ST appointment in April 2019 and thus could not be seen. Pt did not reschedule any missed appointments.   In examining pt's chart it appears she was seen shortly prior to her death in January 22, 2019 by acute SLP. Because of only being seen for evaluation, she made no known progress toward her goals from her evaluation in early 2019.   Remaining deficits: Pt deceased.   Education / Equipment: HEP, late effects head/neck radiation on swallowing.   Plan:                                                    Patient goals were not met. Patient is being discharged due to not returning since the last visit.  ?????       Encounter Date: 02/25/2019   [facility] ,[place], [place]  02/25/2019, 10:35 AM  [facility] [address] [place], [place], [address] Phone: [phone]   Fax:  [phone]

## 2019-07-12 IMAGING — CT CT CERVICAL SPINE W/O CM
3 of 9 series · 11 of 33 positions shown, 12 images · non-contrast
Comparison: Brain MRI dated 02/14/2017 and CT dated 02/14/2017

CLINICAL DATA: 66-year-old female with presumed head injury.

EXAM:
CT HEAD WITHOUT CONTRAST
CT CERVICAL SPINE WITHOUT CONTRAST
TECHNIQUE: Multidetector CT imaging of the head and cervical spine was
performed following the standard protocol without intravenous
contrast. Multiplanar CT image reconstructions of the cervical spine
were also generated.

[Series 6: head without cor · coronal · non-contrast · 0.32mm/px · 3 of 67 slices shown]
[im 17/67  bone]
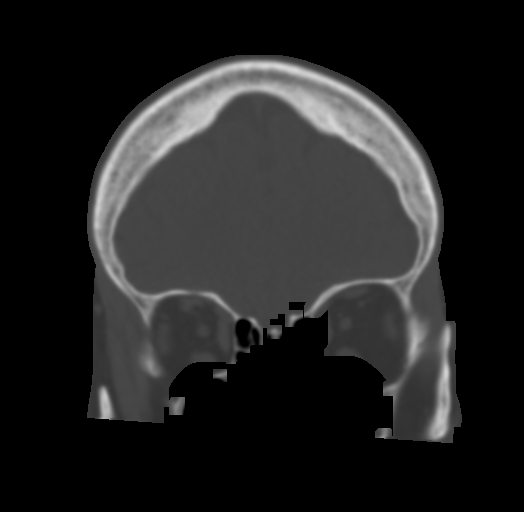
[im 34/67  bone]
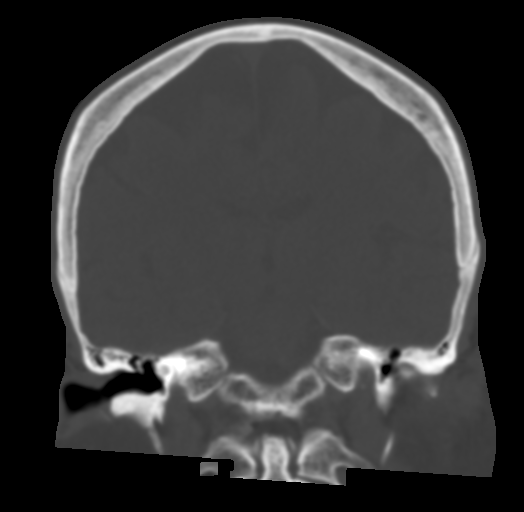
[im 50/67  bone]
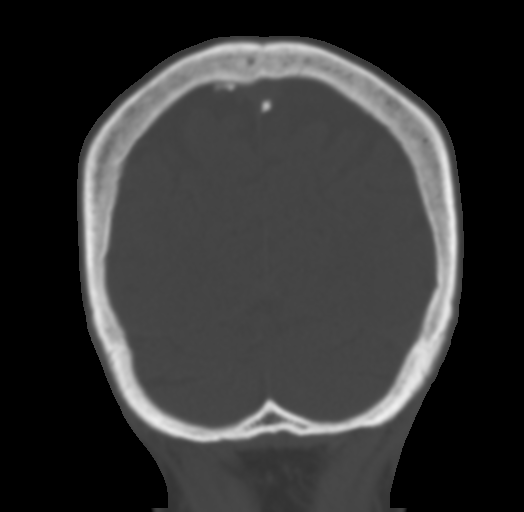

[Series 8: c_spine 2.0 st · axial · 0.33mm/px · z∈[-278,-80]mm · 3 of 100 slices shown, 4 images]
[im 1/100  soft-tissue]
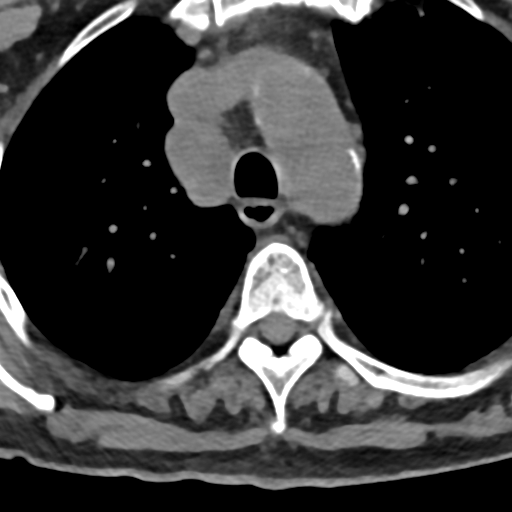
[im 1/100  bone]
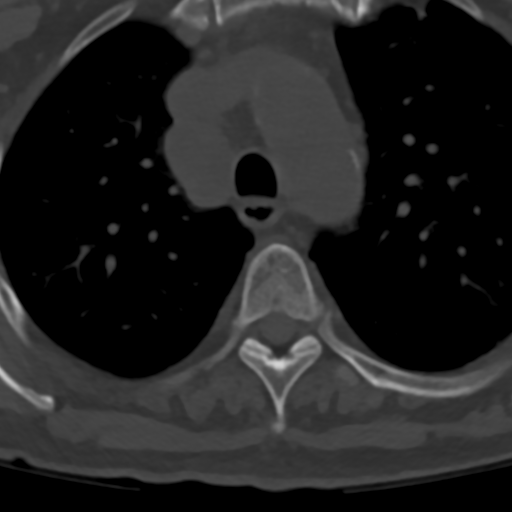
[im 50/100  bone]
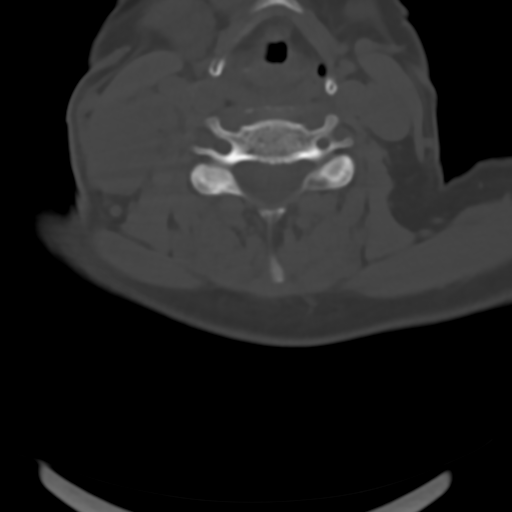
[im 100/100  bone]
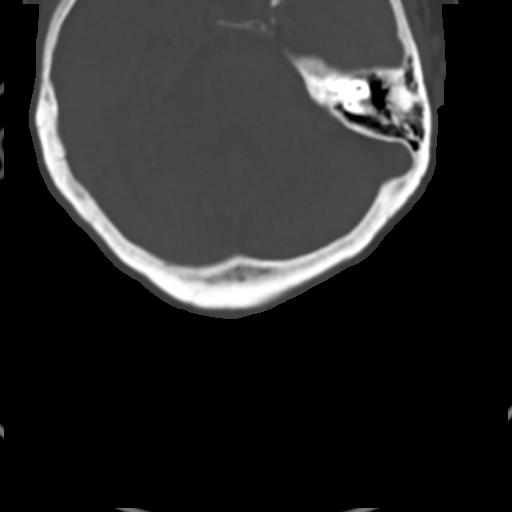

[Series 12: c_spine 2.0 sag bone · sagittal · 0.24mm/px · 5 of 61 slices shown]
[im 11/61  bone]
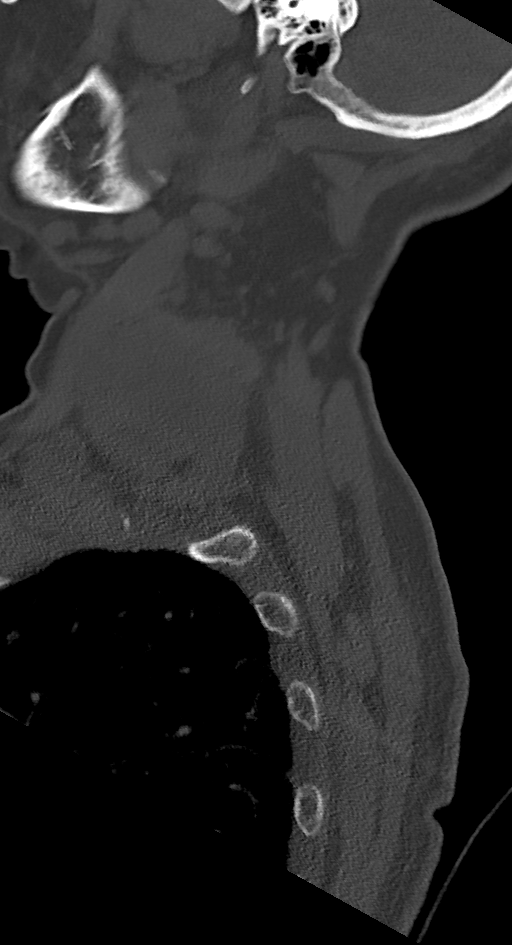
[im 21/61  bone]
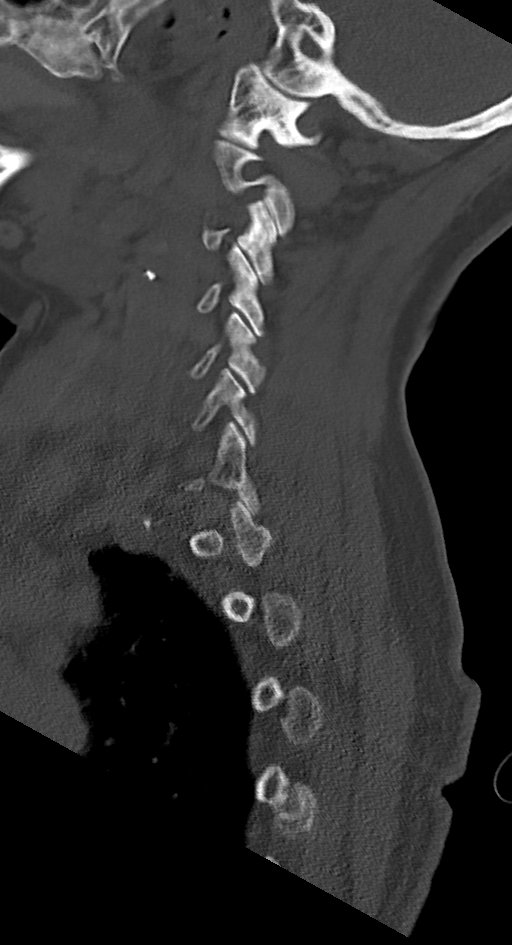
[im 31/61  bone]
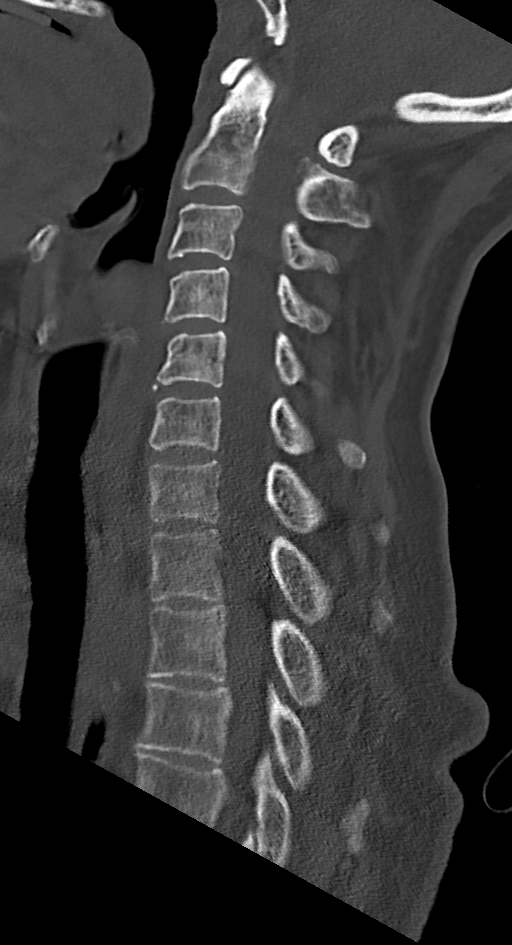
[im 41/61  bone]
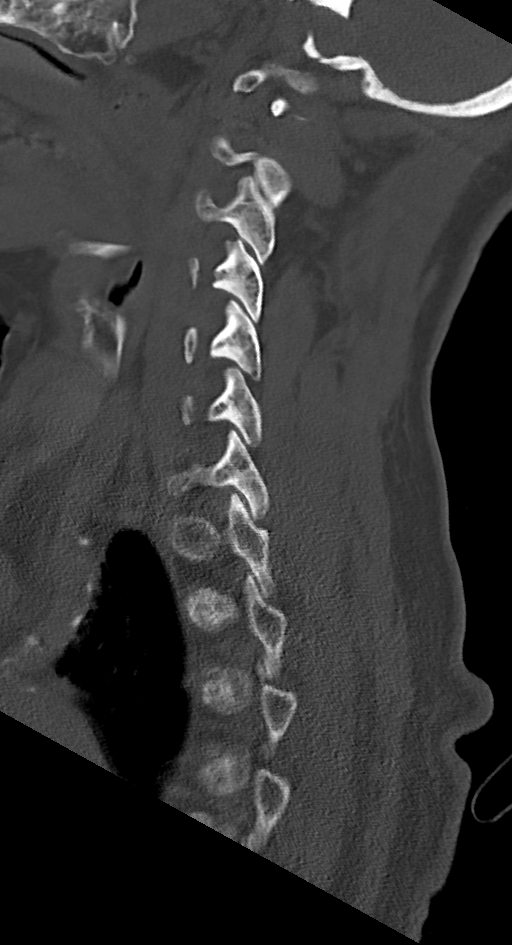
[im 51/61  bone]
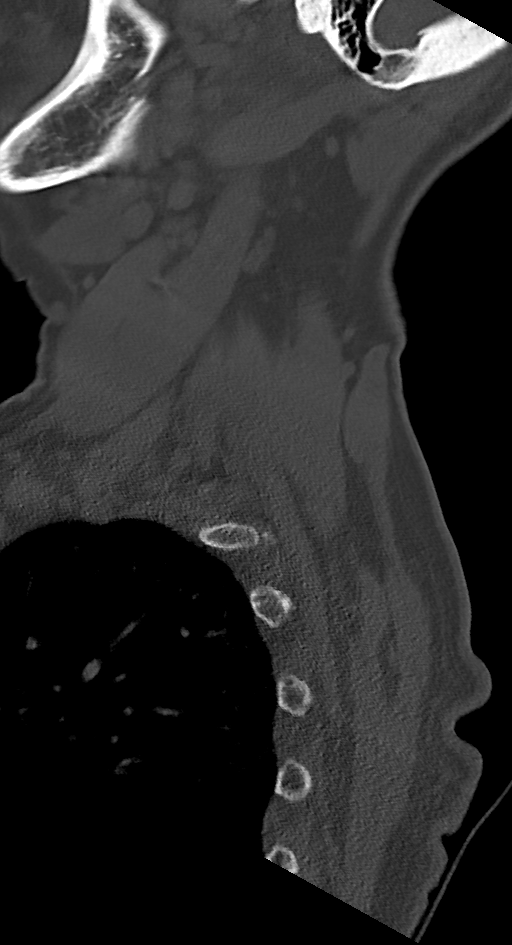

[11 of 33 positions shown; findings below may reference images not displayed]

FINDINGS: CT HEAD FINDINGS

Brain: Minimal atrophy over the convexities. Mild periventricular
and deep white matter chronic microvascular ischemic changes. A
focus of hypodensity in the right frontal subcortical white matter
(series 4, image 17 similar to prior CT. Small old left thalamic
lacunar infarct. There is no acute intracranial hemorrhage. No mass
effect or midline shift. No extra-axial fluid collection.

Vascular: No hyperdense vessel or unexpected calcification.

Skull: Normal. Negative for fracture or focal lesion.

Sinuses/Orbits: No acute finding.

Other: None

CT CERVICAL SPINE FINDINGS

Alignment:  No acute subluxation.

Skull base and vertebrae: No acute fracture. No primary bone lesion
or focal pathologic process.

Soft tissues and spinal canal: No prevertebral fluid or swelling. No
visible canal hematoma.

Disc levels: No acute findings. No significant degenerative changes.

Upper chest: The visualized upper lungs are clear. Upper mediastinal
mass/adenopathy measures 3.5 x 1.9 cm in the right paratracheal
region. Multiple bilateral supraclavicular adenopathy, right greater
left. There is a 5.4 x 2.8 cm mass/conglomerate of large lymph nodes
in the posterior right lateral neck (level V B). Areas of lower
attenuation within this mass most consistent with necrotic tissue.

Other: Bilateral carotid bulb atherosclerotic plaques.
IMPRESSION: 1. No acute intracranial hemorrhage.
2. Mild age-related atrophy and chronic microvascular ischemic
changes. Small old left thalamic infarct.
3. No acute/traumatic cervical spine pathology.
4. Progression of upper mediastinal, and supraclavicular adenopathy.
A large mass/adenopathy in the right lateral neck (level VB) is new
or increased in size compared to the prior CT. Correlation with
history of malignancy recommended.

## 2019-10-22 IMAGING — US IR US GUIDE VASC ACCESS LEFT
1 series · 1 of 1 positions shown · non-contrast
Comparison: none

INDICATION: 66-year-old with metastatic poorly differentiated carcinoma that is
suspicious for primary lung versus head/neck cancer. Patient
presents for Port-A-Cath placement.

[Series 1: ir us guide vasc access left · 0.08mm/px · 1 of 1 slices shown]
[im 1/1]
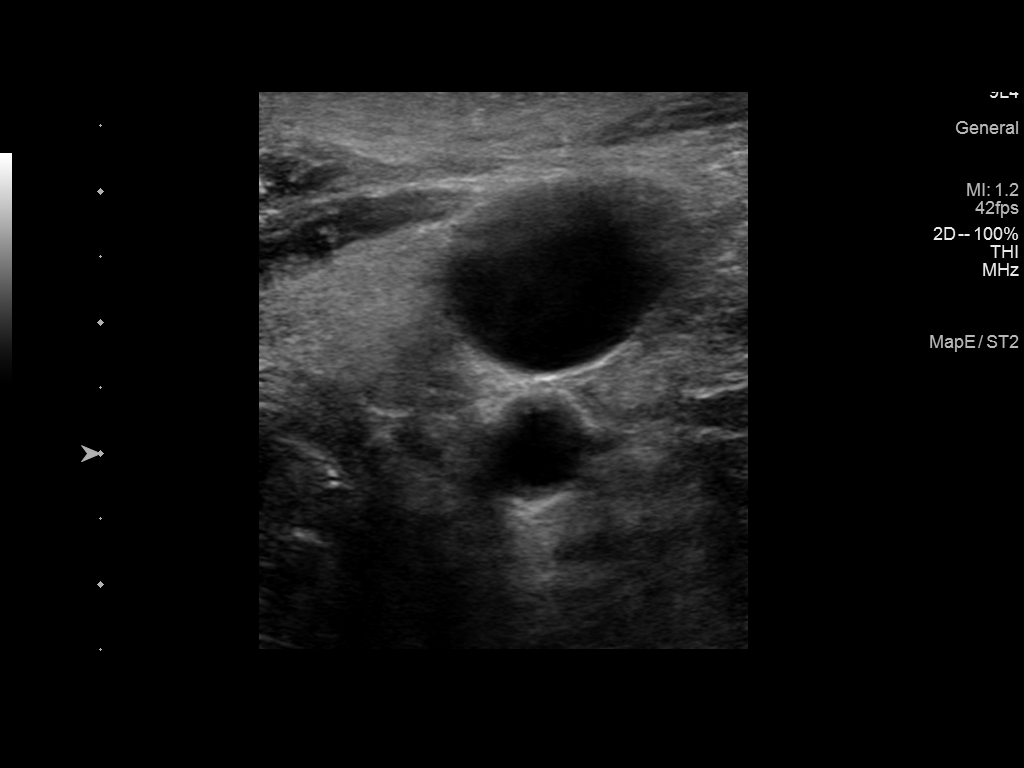

[1 of 1 positions shown; findings below may reference images not displayed]

EXAM:
FLUOROSCOPIC AND ULTRASOUND GUIDED PLACEMENT OF A SUBCUTANEOUS PORT.

MEDICATIONS:
Vancomycin 1 g

ANESTHESIA/SEDATION:
Versed 4.0 mg IV; Fentanyl 200 mcg IV;

Moderate Sedation Time:  31 minutes

The patient was continuously monitored during the procedure by the
interventional radiology nurse under my direct supervision.

FLUOROSCOPY TIME:  48 seconds, 3 mGy

COMPLICATIONS:
None immediate.

PROCEDURE:
The risks of the procedure were explained to the patient. Informed
consent was obtained. Patient was placed supine on the
interventional table. Ultrasound confirmed a patent left internal
jugular vein. The left chest and neck were cleaned with a skin
antiseptic and a sterile drape was placed. Maximal barrier sterile
technique was utilized including caps, mask, sterile gowns, sterile
gloves, sterile drape, hand hygiene and skin antiseptic. The left
neck was anesthetized with 1% lidocaine. Small incision was made in
the left neck with a blade. Micropuncture set was placed in the left
IJ with ultrasound guidance. The micropuncture wire was used for
measurement purposes. The left chest was anesthetized with 1%
lidocaine with epinephrine. #15 blade was used to make an incision
and a subcutaneous port pocket was formed. 8 french Power Port was
assembled. Subcutaneous tunnel was formed with a stiff tunneling
device. The port catheter was brought through the subcutaneous
tunnel. The port was placed in the subcutaneous pocket. The
micropuncture set was exchanged for a peel-away sheath. The catheter
was placed through the peel-away sheath and the tip was positioned
at the superior cavoatrial junction. Catheter placement was
confirmed with fluoroscopy. The port was accessed and flushed with
heparinized saline. The port pocket was closed using two layers of
absorbable sutures and Dermabond. The vein skin site was closed
using a single layer of absorbable suture and Dermabond. Sterile
dressings were applied. Patient tolerated the procedure well without
an immediate complication.

Ultrasound and fluoroscopic images were taken and saved for this
procedure.
IMPRESSION: Placement of a subcutaneous port device. This Port-A-Cath is CT
injectable.
